# Patient Record
Sex: Female | Born: 1935 | Race: White | Hispanic: No | Smoking: Never smoker
Health system: Southern US, Community
[De-identification: ages and names within clinical notes are randomized; demographics above are authoritative.]

## PROBLEM LIST (undated history)

## (undated) DIAGNOSIS — W19XXXA Unspecified fall, initial encounter: Secondary | ICD-10-CM

## (undated) DIAGNOSIS — D473 Essential (hemorrhagic) thrombocythemia: Secondary | ICD-10-CM

## (undated) DIAGNOSIS — N3281 Overactive bladder: Secondary | ICD-10-CM

## (undated) DIAGNOSIS — I10 Essential (primary) hypertension: Secondary | ICD-10-CM

## (undated) DIAGNOSIS — Y92129 Unspecified place in nursing home as the place of occurrence of the external cause: Secondary | ICD-10-CM

## (undated) DIAGNOSIS — R269 Unspecified abnormalities of gait and mobility: Secondary | ICD-10-CM

## (undated) DIAGNOSIS — M5090 Cervical disc disorder, unspecified, unspecified cervical region: Secondary | ICD-10-CM

## (undated) DIAGNOSIS — K219 Gastro-esophageal reflux disease without esophagitis: Secondary | ICD-10-CM

## (undated) DIAGNOSIS — K922 Gastrointestinal hemorrhage, unspecified: Secondary | ICD-10-CM

## (undated) DIAGNOSIS — G9340 Encephalopathy, unspecified: Secondary | ICD-10-CM

## (undated) DIAGNOSIS — I700 Atherosclerosis of aorta: Secondary | ICD-10-CM

## (undated) DIAGNOSIS — G319 Degenerative disease of nervous system, unspecified: Secondary | ICD-10-CM

## (undated) DIAGNOSIS — R5381 Other malaise: Secondary | ICD-10-CM

## (undated) DIAGNOSIS — M5136 Other intervertebral disc degeneration, lumbar region: Secondary | ICD-10-CM

## (undated) DIAGNOSIS — M4120 Other idiopathic scoliosis, site unspecified: Secondary | ICD-10-CM

## (undated) DIAGNOSIS — L309 Dermatitis, unspecified: Secondary | ICD-10-CM

## (undated) DIAGNOSIS — F32A Depression, unspecified: Secondary | ICD-10-CM

## (undated) DIAGNOSIS — I679 Cerebrovascular disease, unspecified: Secondary | ICD-10-CM

## (undated) DIAGNOSIS — F4323 Adjustment disorder with mixed anxiety and depressed mood: Secondary | ICD-10-CM

## (undated) DIAGNOSIS — M810 Age-related osteoporosis without current pathological fracture: Secondary | ICD-10-CM

## (undated) DIAGNOSIS — K5790 Diverticulosis of intestine, part unspecified, without perforation or abscess without bleeding: Secondary | ICD-10-CM

## (undated) DIAGNOSIS — G459 Transient cerebral ischemic attack, unspecified: Secondary | ICD-10-CM

## (undated) DIAGNOSIS — E785 Hyperlipidemia, unspecified: Secondary | ICD-10-CM

## (undated) DIAGNOSIS — M545 Low back pain: Secondary | ICD-10-CM

## (undated) DIAGNOSIS — D649 Anemia, unspecified: Secondary | ICD-10-CM

## (undated) DIAGNOSIS — F329 Major depressive disorder, single episode, unspecified: Secondary | ICD-10-CM

## (undated) DIAGNOSIS — E46 Unspecified protein-calorie malnutrition: Secondary | ICD-10-CM

## (undated) DIAGNOSIS — Z8679 Personal history of other diseases of the circulatory system: Secondary | ICD-10-CM

## (undated) DIAGNOSIS — M47817 Spondylosis without myelopathy or radiculopathy, lumbosacral region: Secondary | ICD-10-CM

## (undated) DIAGNOSIS — R197 Diarrhea, unspecified: Secondary | ICD-10-CM

## (undated) DIAGNOSIS — M419 Scoliosis, unspecified: Secondary | ICD-10-CM

## (undated) DIAGNOSIS — G8929 Other chronic pain: Secondary | ICD-10-CM

## (undated) HISTORY — PX: CHOLECYSTECTOMY: SHX55

## (undated) HISTORY — DX: Essential (primary) hypertension: I10

## (undated) HISTORY — DX: Hyperlipidemia, unspecified: E78.5

## (undated) HISTORY — DX: Overactive bladder: N32.81

## (undated) HISTORY — DX: Age-related osteoporosis without current pathological fracture: M81.0

## (undated) HISTORY — DX: Gastro-esophageal reflux disease without esophagitis: K21.9

## (undated) HISTORY — DX: Adjustment disorder with mixed anxiety and depressed mood: F43.23

## (undated) HISTORY — DX: Diarrhea, unspecified: R19.7

## (undated) HISTORY — PX: ABDOMINAL HYSTERECTOMY: SHX81

## (undated) HISTORY — DX: Spondylosis without myelopathy or radiculopathy, lumbosacral region: M47.817

## (undated) HISTORY — DX: Depression, unspecified: F32.A

## (undated) HISTORY — DX: Transient cerebral ischemic attack, unspecified: G45.9

## (undated) HISTORY — DX: Personal history of other diseases of the circulatory system: Z86.79

## (undated) HISTORY — DX: Unspecified abnormalities of gait and mobility: R26.9

## (undated) HISTORY — DX: Anemia, unspecified: D64.9

## (undated) HISTORY — DX: Major depressive disorder, single episode, unspecified: F32.9

## (undated) HISTORY — DX: Scoliosis, unspecified: M41.9

## (undated) HISTORY — DX: Unspecified protein-calorie malnutrition: E46

## (undated) HISTORY — DX: Atherosclerosis of aorta: I70.0

## (undated) HISTORY — DX: Essential (hemorrhagic) thrombocythemia: D47.3

## (undated) HISTORY — DX: Other intervertebral disc degeneration, lumbar region: M51.36

## (undated) HISTORY — PX: APPENDECTOMY: SHX54

## (undated) HISTORY — DX: Other chronic pain: G89.29

## (undated) HISTORY — DX: Unspecified fall, initial encounter: W19.XXXA

## (undated) HISTORY — DX: Low back pain: M54.5

## (undated) HISTORY — DX: Cervical disc disorder, unspecified, unspecified cervical region: M50.90

## (undated) HISTORY — DX: Dermatitis, unspecified: L30.9

## (undated) HISTORY — PX: TONSILLECTOMY: SUR1361

## (undated) HISTORY — DX: Other idiopathic scoliosis, site unspecified: M41.20

## (undated) HISTORY — DX: Gastrointestinal hemorrhage, unspecified: K92.2

## (undated) HISTORY — DX: Encephalopathy, unspecified: G93.40

## (undated) HISTORY — DX: Cerebrovascular disease, unspecified: I67.9

## (undated) HISTORY — DX: Unspecified place in nursing home as the place of occurrence of the external cause: Y92.129

## (undated) HISTORY — DX: Degenerative disease of nervous system, unspecified: G31.9

## (undated) HISTORY — DX: Diverticulosis of intestine, part unspecified, without perforation or abscess without bleeding: K57.90

## (undated) HISTORY — DX: Other malaise: R53.81

---

## 1998-02-28 ENCOUNTER — Ambulatory Visit (HOSPITAL_COMMUNITY): Admission: RE | Admit: 1998-02-28 | Discharge: 1998-02-28 | Payer: Self-pay | Admitting: *Deleted

## 1998-02-28 ENCOUNTER — Encounter: Payer: Self-pay | Admitting: *Deleted

## 1998-10-29 ENCOUNTER — Other Ambulatory Visit: Admission: RE | Admit: 1998-10-29 | Discharge: 1998-10-29 | Payer: Self-pay | Admitting: *Deleted

## 1999-03-18 ENCOUNTER — Encounter: Payer: Self-pay | Admitting: *Deleted

## 1999-03-18 ENCOUNTER — Ambulatory Visit (HOSPITAL_COMMUNITY): Admission: RE | Admit: 1999-03-18 | Discharge: 1999-03-18 | Payer: Self-pay | Admitting: *Deleted

## 1999-09-30 ENCOUNTER — Other Ambulatory Visit: Admission: RE | Admit: 1999-09-30 | Discharge: 1999-09-30 | Payer: Self-pay | Admitting: *Deleted

## 2000-08-26 ENCOUNTER — Other Ambulatory Visit: Admission: RE | Admit: 2000-08-26 | Discharge: 2000-08-26 | Payer: Self-pay | Admitting: *Deleted

## 2000-09-15 ENCOUNTER — Ambulatory Visit (HOSPITAL_COMMUNITY): Admission: RE | Admit: 2000-09-15 | Discharge: 2000-09-15 | Payer: Self-pay | Admitting: *Deleted

## 2000-09-15 ENCOUNTER — Encounter: Payer: Self-pay | Admitting: *Deleted

## 2000-09-17 ENCOUNTER — Encounter: Admission: RE | Admit: 2000-09-17 | Discharge: 2000-09-17 | Payer: Self-pay | Admitting: *Deleted

## 2001-09-27 ENCOUNTER — Ambulatory Visit (HOSPITAL_COMMUNITY): Admission: RE | Admit: 2001-09-27 | Discharge: 2001-09-27 | Payer: Self-pay | Admitting: *Deleted

## 2001-09-27 ENCOUNTER — Encounter: Payer: Self-pay | Admitting: *Deleted

## 2001-12-19 ENCOUNTER — Ambulatory Visit (HOSPITAL_COMMUNITY): Admission: RE | Admit: 2001-12-19 | Discharge: 2001-12-19 | Payer: Self-pay | Admitting: Cardiology

## 2001-12-19 ENCOUNTER — Encounter: Payer: Self-pay | Admitting: Cardiology

## 2002-03-10 ENCOUNTER — Encounter: Admission: RE | Admit: 2002-03-10 | Discharge: 2002-03-10 | Payer: Self-pay | Admitting: Orthopedic Surgery

## 2002-03-10 ENCOUNTER — Encounter: Payer: Self-pay | Admitting: Orthopedic Surgery

## 2002-03-24 ENCOUNTER — Encounter: Admission: RE | Admit: 2002-03-24 | Discharge: 2002-03-24 | Payer: Self-pay | Admitting: Orthopedic Surgery

## 2002-03-24 ENCOUNTER — Encounter: Payer: Self-pay | Admitting: Orthopedic Surgery

## 2002-10-04 ENCOUNTER — Ambulatory Visit (HOSPITAL_COMMUNITY): Admission: RE | Admit: 2002-10-04 | Discharge: 2002-10-04 | Payer: Self-pay | Admitting: Geriatric Medicine

## 2002-11-20 ENCOUNTER — Other Ambulatory Visit: Admission: RE | Admit: 2002-11-20 | Discharge: 2002-11-20 | Payer: Self-pay | Admitting: Obstetrics and Gynecology

## 2003-12-31 ENCOUNTER — Ambulatory Visit (HOSPITAL_COMMUNITY): Admission: RE | Admit: 2003-12-31 | Discharge: 2003-12-31 | Payer: Self-pay | Admitting: Obstetrics and Gynecology

## 2004-01-29 ENCOUNTER — Other Ambulatory Visit: Admission: RE | Admit: 2004-01-29 | Discharge: 2004-01-29 | Payer: Self-pay | Admitting: Obstetrics and Gynecology

## 2004-02-29 ENCOUNTER — Ambulatory Visit: Payer: Self-pay | Admitting: Internal Medicine

## 2004-05-14 ENCOUNTER — Ambulatory Visit: Payer: Self-pay | Admitting: Internal Medicine

## 2004-05-23 ENCOUNTER — Ambulatory Visit: Payer: Self-pay | Admitting: Internal Medicine

## 2004-05-30 ENCOUNTER — Ambulatory Visit: Payer: Self-pay | Admitting: Internal Medicine

## 2004-07-04 ENCOUNTER — Ambulatory Visit: Payer: Self-pay | Admitting: Internal Medicine

## 2004-10-06 ENCOUNTER — Ambulatory Visit: Payer: Self-pay | Admitting: Internal Medicine

## 2004-10-09 ENCOUNTER — Encounter: Payer: Self-pay | Admitting: Internal Medicine

## 2004-10-09 ENCOUNTER — Ambulatory Visit: Payer: Self-pay

## 2005-03-02 ENCOUNTER — Ambulatory Visit: Payer: Self-pay | Admitting: Internal Medicine

## 2005-04-24 ENCOUNTER — Ambulatory Visit: Payer: Self-pay | Admitting: Internal Medicine

## 2005-04-28 ENCOUNTER — Ambulatory Visit: Payer: Self-pay | Admitting: *Deleted

## 2005-04-30 ENCOUNTER — Ambulatory Visit: Payer: Self-pay | Admitting: Internal Medicine

## 2005-06-05 ENCOUNTER — Ambulatory Visit: Payer: Self-pay | Admitting: Internal Medicine

## 2005-07-01 ENCOUNTER — Encounter: Admission: RE | Admit: 2005-07-01 | Discharge: 2005-07-01 | Payer: Self-pay | Admitting: Neurology

## 2005-08-12 ENCOUNTER — Ambulatory Visit: Payer: Self-pay | Admitting: Internal Medicine

## 2005-09-30 ENCOUNTER — Ambulatory Visit: Payer: Self-pay | Admitting: Internal Medicine

## 2005-12-04 ENCOUNTER — Ambulatory Visit: Payer: Self-pay | Admitting: Internal Medicine

## 2005-12-30 ENCOUNTER — Ambulatory Visit: Payer: Self-pay | Admitting: Internal Medicine

## 2006-02-17 ENCOUNTER — Ambulatory Visit: Payer: Self-pay | Admitting: Internal Medicine

## 2006-05-05 ENCOUNTER — Ambulatory Visit: Payer: Self-pay | Admitting: Internal Medicine

## 2006-05-05 LAB — CONVERTED CEMR LAB
ALT: 16 units/L (ref 0–40)
AST: 24 units/L (ref 0–37)
Albumin: 3.7 g/dL (ref 3.5–5.2)
Alkaline Phosphatase: 65 units/L (ref 39–117)
BUN: 15 mg/dL (ref 6–23)
Bilirubin, Direct: 0.2 mg/dL (ref 0.0–0.3)
CO2: 29 meq/L (ref 19–32)
Calcium: 10.1 mg/dL (ref 8.4–10.5)
Chloride: 106 meq/L (ref 96–112)
Cholesterol: 208 mg/dL (ref 0–200)
Creatinine, Ser: 1 mg/dL (ref 0.4–1.2)
Direct LDL: 114.7 mg/dL
GFR calc Af Amer: 69 mL/min
GFR calc non Af Amer: 57 mL/min
Glucose, Bld: 85 mg/dL (ref 70–99)
HDL: 64.2 mg/dL (ref >39.0)
Potassium: 4.7 meq/L (ref 3.5–5.1)
Sodium: 140 meq/L (ref 135–145)
Total Bilirubin: 0.8 mg/dL (ref 0.3–1.2)
Total CHOL/HDL Ratio: 3.2
Total Protein: 6.6 g/dL (ref 6.0–8.3)
Triglycerides: 171 mg/dL — ABNORMAL HIGH (ref 0–149)
VLDL: 34 mg/dL (ref 0–40)

## 2006-08-18 ENCOUNTER — Ambulatory Visit: Payer: Self-pay | Admitting: Internal Medicine

## 2006-09-17 ENCOUNTER — Ambulatory Visit: Payer: Self-pay | Admitting: Internal Medicine

## 2006-09-20 DIAGNOSIS — G459 Transient cerebral ischemic attack, unspecified: Secondary | ICD-10-CM

## 2006-09-20 DIAGNOSIS — E785 Hyperlipidemia, unspecified: Secondary | ICD-10-CM

## 2006-09-20 DIAGNOSIS — Z8679 Personal history of other diseases of the circulatory system: Secondary | ICD-10-CM

## 2006-09-20 DIAGNOSIS — M810 Age-related osteoporosis without current pathological fracture: Secondary | ICD-10-CM

## 2006-09-20 DIAGNOSIS — K219 Gastro-esophageal reflux disease without esophagitis: Secondary | ICD-10-CM | POA: Insufficient documentation

## 2006-09-20 DIAGNOSIS — J45909 Unspecified asthma, uncomplicated: Secondary | ICD-10-CM

## 2006-09-20 DIAGNOSIS — I10 Essential (primary) hypertension: Secondary | ICD-10-CM

## 2006-09-20 HISTORY — DX: Essential (primary) hypertension: I10

## 2006-09-20 HISTORY — DX: Hyperlipidemia, unspecified: E78.5

## 2006-09-20 HISTORY — DX: Personal history of other diseases of the circulatory system: Z86.79

## 2006-09-20 HISTORY — DX: Age-related osteoporosis without current pathological fracture: M81.0

## 2006-09-20 HISTORY — DX: Transient cerebral ischemic attack, unspecified: G45.9

## 2006-09-29 ENCOUNTER — Ambulatory Visit: Payer: Self-pay | Admitting: Internal Medicine

## 2006-09-29 LAB — CONVERTED CEMR LAB
BUN: 22 mg/dL (ref 6–23)
Basophils Absolute: 0 10*3/uL (ref 0.0–0.1)
Basophils Relative: 0.2 % (ref 0.0–1.0)
CO2: 32 meq/L (ref 19–32)
Calcium: 10.3 mg/dL (ref 8.4–10.5)
Chloride: 105 meq/L (ref 96–112)
Cholesterol: 232 mg/dL (ref 0–200)
Creatinine, Ser: 1.2 mg/dL (ref 0.4–1.2)
Direct LDL: 135.2 mg/dL
Eosinophils Absolute: 0.3 10*3/uL (ref 0.0–0.6)
Eosinophils Relative: 3.3 % (ref 0.0–5.0)
GFR calc Af Amer: 56 mL/min
GFR calc non Af Amer: 46 mL/min
Glucose, Bld: 121 mg/dL — ABNORMAL HIGH (ref 70–99)
HCT: 39.3 % (ref 36.0–46.0)
HDL: 65.6 mg/dL (ref >39.0)
Hemoglobin: 13.6 g/dL (ref 12.0–15.0)
Lymphocytes Relative: 14.9 % (ref 12.0–46.0)
MCHC: 34.7 g/dL (ref 30.0–36.0)
MCV: 91.6 fL (ref 78.0–100.0)
Monocytes Absolute: 0.9 10*3/uL — ABNORMAL HIGH (ref 0.2–0.7)
Monocytes Relative: 9.7 % (ref 3.0–11.0)
Neutro Abs: 6.6 10*3/uL (ref 1.4–7.7)
Neutrophils Relative %: 71.9 % (ref 43.0–77.0)
Platelets: 345 10*3/uL (ref 150–400)
Potassium: 4.1 meq/L (ref 3.5–5.1)
RBC: 4.29 M/uL (ref 3.87–5.11)
RDW: 12.1 % (ref 11.5–14.6)
Sodium: 145 meq/L (ref 135–145)
TSH: 0.78 microintl units/mL (ref 0.35–5.50)
Total CHOL/HDL Ratio: 3.5
Triglycerides: 148 mg/dL (ref 0–149)
VLDL: 30 mg/dL (ref 0–40)
WBC: 9.2 10*3/uL (ref 4.5–10.5)

## 2006-11-04 ENCOUNTER — Ambulatory Visit: Payer: Self-pay | Admitting: Internal Medicine

## 2006-11-17 ENCOUNTER — Ambulatory Visit: Payer: Self-pay | Admitting: Internal Medicine

## 2006-11-22 DIAGNOSIS — F329 Major depressive disorder, single episode, unspecified: Secondary | ICD-10-CM

## 2006-11-22 DIAGNOSIS — D649 Anemia, unspecified: Secondary | ICD-10-CM

## 2006-11-23 ENCOUNTER — Ambulatory Visit: Payer: Self-pay | Admitting: Internal Medicine

## 2006-12-24 ENCOUNTER — Ambulatory Visit: Payer: Self-pay | Admitting: Internal Medicine

## 2007-03-01 ENCOUNTER — Other Ambulatory Visit: Admission: RE | Admit: 2007-03-01 | Discharge: 2007-03-01 | Payer: Self-pay | Admitting: Obstetrics and Gynecology

## 2007-03-02 ENCOUNTER — Ambulatory Visit: Payer: Self-pay | Admitting: Internal Medicine

## 2007-04-25 ENCOUNTER — Ambulatory Visit (HOSPITAL_COMMUNITY): Admission: RE | Admit: 2007-04-25 | Discharge: 2007-04-25 | Payer: Self-pay | Admitting: Obstetrics and Gynecology

## 2007-06-08 ENCOUNTER — Ambulatory Visit: Payer: Self-pay | Admitting: Internal Medicine

## 2007-08-23 ENCOUNTER — Ambulatory Visit: Payer: Self-pay | Admitting: Internal Medicine

## 2007-09-07 ENCOUNTER — Ambulatory Visit: Payer: Self-pay | Admitting: Internal Medicine

## 2007-09-08 ENCOUNTER — Telehealth: Payer: Self-pay | Admitting: Internal Medicine

## 2007-11-21 ENCOUNTER — Telehealth: Payer: Self-pay | Admitting: Internal Medicine

## 2007-11-21 ENCOUNTER — Ambulatory Visit: Payer: Self-pay | Admitting: Internal Medicine

## 2007-12-08 ENCOUNTER — Ambulatory Visit: Payer: Self-pay | Admitting: Internal Medicine

## 2007-12-09 LAB — CONVERTED CEMR LAB
AST: 19 units/L (ref 0–37)
Alkaline Phosphatase: 58 units/L (ref 39–117)
Bilirubin, Direct: 0.1 mg/dL (ref 0.0–0.3)
CO2: 30 meq/L (ref 19–32)
Chloride: 107 meq/L (ref 96–112)
Creatinine, Ser: 0.9 mg/dL (ref 0.4–1.2)
Eosinophils Absolute: 0.1 10*3/uL (ref 0.0–0.7)
Eosinophils Relative: 1.5 % (ref 0.0–5.0)
GFR calc Af Amer: 77 mL/min
GFR calc non Af Amer: 64 mL/min
Hemoglobin: 13.9 g/dL (ref 12.0–15.0)
LDL Cholesterol: 108 mg/dL — ABNORMAL HIGH (ref 0–99)
Lymphocytes Relative: 8.5 % — ABNORMAL LOW (ref 12.0–46.0)
MCV: 93.5 fL (ref 78.0–100.0)
Neutro Abs: 6.9 10*3/uL (ref 1.4–7.7)
Neutrophils Relative %: 82 % — ABNORMAL HIGH (ref 43.0–77.0)
Platelets: 430 10*3/uL — ABNORMAL HIGH (ref 150–400)
RBC: 4.28 M/uL (ref 3.87–5.11)
RDW: 12.4 % (ref 11.5–14.6)
Sodium: 143 meq/L (ref 135–145)
WBC: 8.3 10*3/uL (ref 4.5–10.5)

## 2007-12-29 ENCOUNTER — Ambulatory Visit: Payer: Self-pay | Admitting: Internal Medicine

## 2007-12-29 LAB — CONVERTED CEMR LAB
Ketones, urine, test strip: NEGATIVE
Nitrite: NEGATIVE
Protein, U semiquant: NEGATIVE
Specific Gravity, Urine: 1.01
pH: 7

## 2008-02-27 ENCOUNTER — Telehealth: Payer: Self-pay | Admitting: Internal Medicine

## 2008-04-13 LAB — HM MAMMOGRAPHY: HM Mammogram: ABNORMAL

## 2008-05-09 ENCOUNTER — Ambulatory Visit (HOSPITAL_COMMUNITY): Admission: RE | Admit: 2008-05-09 | Discharge: 2008-05-09 | Payer: Self-pay | Admitting: Obstetrics and Gynecology

## 2008-05-24 ENCOUNTER — Ambulatory Visit: Payer: Self-pay | Admitting: Family Medicine

## 2008-05-24 LAB — CONVERTED CEMR LAB
Glucose, Urine, Semiquant: NEGATIVE
Nitrite: NEGATIVE
Specific Gravity, Urine: 1.025
Urobilinogen, UA: 1
pH: 6

## 2008-06-25 ENCOUNTER — Ambulatory Visit: Payer: Self-pay | Admitting: Internal Medicine

## 2008-06-26 LAB — CONVERTED CEMR LAB
BUN: 14 mg/dL (ref 6–23)
Potassium: 3.3 meq/L — ABNORMAL LOW (ref 3.5–5.1)
Sodium: 142 meq/L (ref 135–145)

## 2008-06-28 LAB — CONVERTED CEMR LAB: Vit D, 25-Hydroxy: 30 ng/mL (ref 30–89)

## 2008-07-04 ENCOUNTER — Ambulatory Visit: Payer: Self-pay | Admitting: Internal Medicine

## 2008-07-09 LAB — CONVERTED CEMR LAB
BUN: 14 mg/dL (ref 6–23)
CO2: 30 meq/L (ref 19–32)
Calcium: 9.9 mg/dL (ref 8.4–10.5)
GFR calc non Af Amer: 63.64 mL/min (ref >60)
Sodium: 144 meq/L (ref 135–145)

## 2008-10-17 ENCOUNTER — Ambulatory Visit: Payer: Self-pay | Admitting: Internal Medicine

## 2008-10-17 LAB — CONVERTED CEMR LAB: Vit D, 25-Hydroxy: 40 ng/mL (ref 30–89)

## 2008-12-10 ENCOUNTER — Ambulatory Visit: Payer: Self-pay | Admitting: Family Medicine

## 2008-12-18 ENCOUNTER — Ambulatory Visit: Payer: Self-pay | Admitting: Internal Medicine

## 2009-01-07 ENCOUNTER — Ambulatory Visit: Payer: Self-pay | Admitting: Internal Medicine

## 2009-01-10 LAB — CONVERTED CEMR LAB
CO2: 29 meq/L (ref 19–32)
Calcium: 10.1 mg/dL (ref 8.4–10.5)
Chloride: 109 meq/L (ref 96–112)
Creatinine, Ser: 0.8 mg/dL (ref 0.4–1.2)
GFR calc non Af Amer: 72.82 mL/min (ref >60)
Sodium: 145 meq/L (ref 135–145)

## 2009-03-15 ENCOUNTER — Ambulatory Visit: Payer: Self-pay | Admitting: Family Medicine

## 2009-03-18 ENCOUNTER — Ambulatory Visit: Payer: Self-pay | Admitting: Obstetrics and Gynecology

## 2009-04-18 ENCOUNTER — Ambulatory Visit: Payer: Self-pay | Admitting: Obstetrics and Gynecology

## 2009-05-28 ENCOUNTER — Telehealth: Payer: Self-pay | Admitting: Internal Medicine

## 2009-05-31 ENCOUNTER — Telehealth: Payer: Self-pay | Admitting: Internal Medicine

## 2009-06-11 ENCOUNTER — Encounter: Payer: Self-pay | Admitting: Internal Medicine

## 2009-07-01 ENCOUNTER — Ambulatory Visit: Payer: Self-pay | Admitting: Internal Medicine

## 2009-07-02 LAB — CONVERTED CEMR LAB
Basophils Absolute: 0 10*3/uL (ref 0.0–0.1)
CO2: 31 meq/L (ref 19–32)
Chloride: 108 meq/L (ref 96–112)
Glucose, Bld: 98 mg/dL (ref 70–99)
Monocytes Absolute: 0.7 10*3/uL (ref 0.1–1.0)
Platelets: 286 10*3/uL (ref 150.0–400.0)
Potassium: 4.7 meq/L (ref 3.5–5.1)
RBC: 4.24 M/uL (ref 3.87–5.11)
RDW: 12.2 % (ref 11.5–14.6)
Sodium: 144 meq/L (ref 135–145)
WBC: 7.5 10*3/uL (ref 4.5–10.5)

## 2009-07-10 ENCOUNTER — Encounter: Payer: Self-pay | Admitting: Internal Medicine

## 2009-10-18 ENCOUNTER — Ambulatory Visit: Payer: Self-pay | Admitting: Internal Medicine

## 2009-10-21 ENCOUNTER — Telehealth: Payer: Self-pay | Admitting: Internal Medicine

## 2009-10-25 ENCOUNTER — Encounter: Admission: RE | Admit: 2009-10-25 | Discharge: 2009-10-25 | Payer: Self-pay | Admitting: Internal Medicine

## 2009-10-28 DIAGNOSIS — M4120 Other idiopathic scoliosis, site unspecified: Secondary | ICD-10-CM | POA: Insufficient documentation

## 2009-10-28 DIAGNOSIS — M5137 Other intervertebral disc degeneration, lumbosacral region: Secondary | ICD-10-CM | POA: Insufficient documentation

## 2009-10-28 DIAGNOSIS — M47817 Spondylosis without myelopathy or radiculopathy, lumbosacral region: Secondary | ICD-10-CM | POA: Insufficient documentation

## 2009-10-28 HISTORY — DX: Other idiopathic scoliosis, site unspecified: M41.20

## 2009-10-28 HISTORY — DX: Spondylosis without myelopathy or radiculopathy, lumbosacral region: M47.817

## 2009-12-20 ENCOUNTER — Ambulatory Visit: Payer: Self-pay | Admitting: Family Medicine

## 2009-12-20 LAB — CONVERTED CEMR LAB
Bilirubin Urine: NEGATIVE
Urobilinogen, UA: 0.2
pH: 5.5

## 2009-12-27 ENCOUNTER — Ambulatory Visit: Payer: Self-pay | Admitting: Internal Medicine

## 2009-12-28 ENCOUNTER — Encounter: Payer: Self-pay | Admitting: Internal Medicine

## 2009-12-30 ENCOUNTER — Ambulatory Visit: Payer: Self-pay | Admitting: Internal Medicine

## 2009-12-30 LAB — CONVERTED CEMR LAB
Basophils Absolute: 0 10*3/uL (ref 0.0–0.1)
Basophils Relative: 0 % (ref 0.0–3.0)
Calcium: 11 mg/dL — ABNORMAL HIGH (ref 8.4–10.5)
Chloride: 102 meq/L (ref 96–112)
Eosinophils Relative: 0.9 % (ref 0.0–5.0)
Glucose, Bld: 88 mg/dL (ref 70–99)
HCT: 43.8 % (ref 36.0–46.0)
Hemoglobin: 15.3 g/dL — ABNORMAL HIGH (ref 12.0–15.0)
MCHC: 34.8 g/dL (ref 30.0–36.0)
Monocytes Relative: 7.1 % (ref 3.0–12.0)
RBC: 4.59 M/uL (ref 3.87–5.11)
RDW: 13.5 % (ref 11.5–14.6)
Sodium: 141 meq/L (ref 135–145)

## 2009-12-31 ENCOUNTER — Ambulatory Visit: Payer: Self-pay | Admitting: Internal Medicine

## 2009-12-31 LAB — CONVERTED CEMR LAB
BUN: 14 mg/dL (ref 6–23)
CO2: 32 meq/L (ref 19–32)

## 2010-01-01 ENCOUNTER — Encounter: Payer: Self-pay | Admitting: Internal Medicine

## 2010-01-22 ENCOUNTER — Ambulatory Visit: Payer: Self-pay | Admitting: Internal Medicine

## 2010-03-13 ENCOUNTER — Encounter: Payer: Self-pay | Admitting: Internal Medicine

## 2010-03-25 ENCOUNTER — Ambulatory Visit: Payer: Self-pay | Admitting: Internal Medicine

## 2010-04-28 ENCOUNTER — Encounter: Payer: Self-pay | Admitting: Internal Medicine

## 2010-04-28 ENCOUNTER — Other Ambulatory Visit: Payer: Self-pay | Admitting: Internal Medicine

## 2010-04-28 ENCOUNTER — Ambulatory Visit
Admission: RE | Admit: 2010-04-28 | Discharge: 2010-04-28 | Payer: Self-pay | Source: Home / Self Care | Attending: Internal Medicine | Admitting: Internal Medicine

## 2010-04-28 DIAGNOSIS — R269 Unspecified abnormalities of gait and mobility: Secondary | ICD-10-CM

## 2010-04-28 HISTORY — DX: Unspecified abnormalities of gait and mobility: R26.9

## 2010-04-28 LAB — BASIC METABOLIC PANEL
BUN: 14 mg/dL (ref 6–23)
CO2: 28 mEq/L (ref 19–32)
Calcium: 10.1 mg/dL (ref 8.4–10.5)
Chloride: 103 mEq/L (ref 96–112)
Creatinine, Ser: 0.8 mg/dL (ref 0.4–1.2)
GFR: 77.01 mL/min (ref >60.00)
Glucose, Bld: 100 mg/dL — ABNORMAL HIGH (ref 70–99)
Potassium: 4.7 mEq/L (ref 3.5–5.1)
Sodium: 139 mEq/L (ref 135–145)

## 2010-05-04 ENCOUNTER — Encounter: Payer: Self-pay | Admitting: Internal Medicine

## 2010-05-13 NOTE — Medication Information (Signed)
Summary: Order for [facility] with wheels and seat  Order for [name] with wheels and seat   Imported By: Maryln Gottron 07/15/2009 12:26:06  _____________________________________________________________________  External Attachment:    Type:   Image     Comment:   External Document

## 2010-05-13 NOTE — Assessment & Plan Note (Signed)
Summary: 6 month rov/njr   Vital Signs:  Patient profile:   77 year old female BP sitting:   140 / 80  (left arm) Cuff size:   regular  Vitals Entered By: Kern Reap CMA Duncan Dull) (July 01, 2009 11:45 AM) CC: follow-up visit Is Patient Diabetic? No   CC:  follow-up visit.  History of Present Illness:  Follow-Up Visit      This is an 77 year old woman who presents for Follow-up visit.  The patient denies chest pain and palpitations.  Since the last visit the patient notes no new problems or concerns.  The patient reports taking meds as prescribed and not monitoring BP.  When questioned about possible medication side effects, the patient notes none.  Has seen dr Ross Marcus results except for dry mouth.  All other systems reviewed and were negative   Current Problems (verified): 1)  Sciatica  (ICD-724.3) 2)  Unsteady Gait  (ICD-781.2) 3)  Common Migraine  (ICD-346.10) 4)  Depression  (ICD-311) 5)  Anemia-nos  (ICD-285.9) 6)  Transient Ischemic Attack, Hx of  (ICD-V12.50) 7)  Osteoporosis  (ICD-733.00) 8)  Hypertension  (ICD-401.9) 9)  Hyperlipidemia  (ICD-272.4) 10)  Gerd  (ICD-530.81) 11)  Asthma  (ICD-493.90)  Current Medications (verified): 1)  Benicar 20 Mg  Tabs (Olmesartan Medoxomil) .... One Daily 2)  Norvasc 5 Mg  Tabs (Amlodipine Besylate) .... One By Mouth Daily 3)  Boniva 150 Mg  Tabs (Ibandronate Sodium) .... One Monthly 4)  Aspir-81 81 Mg Tbec (Aspirin) .... Take 1 Tablet By Mouth Once A Day 5)  Vitamin C 500 Mg Tabs (Ascorbic Acid) .... Take 1 Once A Day 6)  Caltrate 600+d 600-400 Mg-Unit  Tabs (Calcium Carbonate-Vitamin D) .... Once Daily 7)  Centrum Silver   Tabs (Multiple Vitamins-Minerals) .... Once Daily 8)  Proair Hfa 108 (90 Base) Mcg/act  Aers (Albuterol Sulfate) .... Use Qid As Directed 9)  Vesicare 10 Mg Tabs (Solifenacin Succinate) .... Take 1 Tablet By Mouth Once A Day 10)  Diclofenac Sodium 50 Mg Tbec (Diclofenac Sodium) ....  Three Times A Day As Needed Pain  Allergies (verified): 1)  Amoxicillin (Amoxicillin) 2)  Penicillin V Potassium (Penicillin V Potassium) 3)  Sulfamethoxazole (Sulfamethoxazole)  Past History:  Past Medical History: Last updated: December 10, 2006 Allergic rhinitis Asthma GERD Hyperlipidemia Hypertension Osteoporosis Transient ischemic attack, hx of Urinary incontinence DDD-neck cerebral small vessel disease Anemia-NOS Depression Headache  Past Surgical History: Last updated: 12-10-06 Appendectomy Hysterectomy Tonsillectomy Cholecystectomy  Family History: Last updated: 12-10-06 first husband deceased with lung CANCER mother deceased with CHF 21 yo father deceased pancreatic CA 61 Brother deceased with leukemia 63 yo Family History of Alcoholism/Addiction Family History of Arthritis Family History Breast cancer 1st degree relative <50 Family History Diabetes 1st degree relative Family History High cholesterol Family History Hypertension Family History Other cancer Family History of Stroke F 1st degree relative <60 Family History of Cardiovascular disorder  Social History: Last updated: 12-10-06 Single Never Smoked Retired Alcohol use-no Drug use-no Regular exercise-yes  Risk Factors: Exercise: yes (12/10/06)  Risk Factors: Smoking Status: never (11/04/2006)  Physical Exam  General:  alert and well-developed.   Head:  normocephalic and atraumatic.   Eyes:  pupils equal and pupils round.   Ears:  R ear normal and L ear normal.   Neck:  No deformities, masses, or tenderness noted. Lungs:  normal respiratory effort and no intercostal retractions.   Heart:  normal rate and regular rhythm.   Abdomen:  soft  and non-tender.   Msk:  No deformity or scoliosis noted of thoracic or lumbar spine.   Pulses:  R radial normal and L radial normal.   Neurologic:  cranial nerves II-XII intact and gait normal.     Impression & Recommendations:  Problem #  1:  UNSTEADY GAIT (ICD-781.2) hand written Rx for rolling walkier  Problem # 2:  HYPERTENSION (ICD-401.9)  reasonable control continue current medications  The following medications were removed from the medication list:    Benicar 20 Mg Tabs (Olmesartan medoxomil) ..... One daily Her updated medication list for this problem includes:    Norvasc 5 Mg Tabs (Amlodipine besylate) ..... One by mouth daily    Losartan Potassium 100 Mg Tabs (Losartan potassium) .Marland Kitchen... Take 1 tablet by mouth once a day  BP today: 140/80 Prior BP: 134/74 (03/15/2009)  Labs Reviewed: K+: 4.5 (01/07/2009) Creat: : 0.8 (01/07/2009)   Chol: 204 (12/08/2007)   HDL: 70.6 (12/08/2007)   LDL: 108 (12/08/2007)   TG: 127 (12/08/2007)  Orders: Venipuncture (28413) TLB-BMP (Basic Metabolic Panel-BMET) (80048-METABOL)  Problem # 3:  HYPERLIPIDEMIA (ICD-272.4)  no need for further evaluation Labs Reviewed: SGOT: 19 (12/08/2007)   SGPT: 14 (12/08/2007)   HDL:70.6 (12/08/2007), 65.6 (09/29/2006)  LDL:108 (12/08/2007), DEL (09/29/2006)  Chol:204 (12/08/2007), 232 (09/29/2006)  Trig:127 (12/08/2007), 148 (09/29/2006)  Orders: Venipuncture (24401)  Problem # 4:  GERD (ICD-530.81)  no sxs and not on any meds  Labs Reviewed: Hgb: 13.9 (12/08/2007)   Hct: 40.0 (12/08/2007)  Orders: Venipuncture (02725)  Problem # 5:  TRANSIENT ISCHEMIC ATTACK, HX OF (ICD-V12.50) no recurrence  Problem # 6:  SCIATICA (ICD-724.3) uses as needed diclofenac with great results Her updated medication list for this problem includes:    Aspir-81 81 Mg Tbec (Aspirin) .Marland Kitchen... Take 1 tablet by mouth once a day    Diclofenac Sodium 50 Mg Tbec (Diclofenac sodium) .Marland Kitchen... Take 1 tablet by mouth once a day as needed for hip/leg pain  Complete Medication List: 1)  Norvasc 5 Mg Tabs (Amlodipine besylate) .... One by mouth daily 2)  Boniva 150 Mg Tabs (Ibandronate sodium) .... One monthly 3)  Aspir-81 81 Mg Tbec (Aspirin) .... Take 1 tablet by  mouth once a day 4)  Vitamin C 500 Mg Tabs (Ascorbic acid) .... Take 1 once a day 5)  Caltrate 600+d 600-400 Mg-unit Tabs (Calcium carbonate-vitamin d) .... Once daily 6)  Centrum Silver Tabs (Multiple vitamins-minerals) .... Once daily 7)  Proair Hfa 108 (90 Base) Mcg/act Aers (Albuterol sulfate) .... Use qid as directed 8)  Diclofenac Sodium 50 Mg Tbec (Diclofenac sodium) .... Take 1 tablet by mouth once a day as needed for hip/leg pain 9)  Losartan Potassium 100 Mg Tabs (Losartan potassium) .... Take 1 tablet by mouth once a day 10)  Toviaz 4 Mg Xr24h-tab (Fesoterodine fumarate) .... Take 1 tablet by mouth once a day  Other Orders: TLB-CBC Platelet - w/Differential (85025-CBCD)  Patient Instructions: 1)  Please schedule a follow-up appointment in 6 months. Prescriptions: LOSARTAN POTASSIUM 100 MG TABS (LOSARTAN POTASSIUM) Take 1 tablet by mouth once a day  #90 x 3   Entered and Authorized by:   Birdie Sons MD   Signed by:   Birdie Sons MD on 07/01/2009   Method used:   Electronically to        [facility]* (retail)       [address]       [place], [place]  [address]  Ph: [phone]       Fax: [phone]   RxID:   [id number]

## 2010-05-13 NOTE — Assessment & Plan Note (Signed)
Summary: 6 month fup//ccm   Vital Signs:  Patient profile:   77 year old female Weight:      146 pounds Temp:     98.3 degrees F oral BP sitting:   120 / 76  (left arm) Cuff size:   regular  Vitals Entered By: Kern Reap CMA Duncan Dull) (December 27, 2009 11:34 AM) CC: follow-up visit Is Patient Diabetic? No   CC:  follow-up visit.  History of Present Illness: recurrent UTI sxs---she has some frequency---no dysuria reviewed records from dr Doroteo Bradford note Needs f/u  requests flu immunization  Htn---currently not on any medications  Back pain/sciatica----no sxs  Current Medications (verified): 1)  Norvasc 5 Mg  Tabs (Amlodipine Besylate) .... One By Mouth Daily 2)  Boniva 150 Mg  Tabs (Ibandronate Sodium) .... One Monthly 3)  Aspir-81 81 Mg Tbec (Aspirin) .... Take 1 Tablet By Mouth Once A Day 4)  Vitamin C 500 Mg Tabs (Ascorbic Acid) .... Take 1 Once A Day 5)  Caltrate 600+d 600-400 Mg-Unit  Tabs (Calcium Carbonate-Vitamin D) .... Once Daily 6)  Centrum Silver   Tabs (Multiple Vitamins-Minerals) .... Once Daily 7)  Proair Hfa 108 (90 Base) Mcg/act  Aers (Albuterol Sulfate) .... Use Qid As Directed 8)  Losartan Potassium 100 Mg Tabs (Losartan Potassium) .... Take 1 Tablet By Mouth Once A Day  Allergies: 1)  Amoxicillin (Amoxicillin) 2)  Penicillin V Potassium (Penicillin V Potassium) 3)  Sulfamethoxazole (Sulfamethoxazole)  Physical Exam  General:  alert and well-developed.   Head:  normocephalic and atraumatic.   Eyes:  pupils equal and pupils round.   Ears:  R ear normal and L ear normal.   Neck:  No deformities, masses, or tenderness noted. Chest Wall:  No deformities, masses, or tenderness noted. Lungs:  normal respiratory effort and no intercostal retractions.   Abdomen:  soft and non-tender.   Skin:  turgor normal and color normal.   Psych:  normally interactive and good eye contact.     Impression & Recommendations:  Problem # 1:  FREQUENCY,  URINARY (ICD-788.41) check urine culture---document clearance The following medications were removed from the medication list:    Toviaz 4 Mg Xr24h-tab (Fesoterodine fumarate) .Marland Kitchen... Take 1 tablet by mouth once a day    Pyridium 200 Mg Tabs (Phenazopyridine hcl) .Marland Kitchen... 1 tab by mouth three times a day x 2 days as needed pain  Orders: T-Culture, Urine ([id number]) Specimen Handling (96295)  Problem # 2:  HYPERTENSION (ICD-401.9) controlled continue current medications  Her updated medication list for this problem includes:    Norvasc 5 Mg Tabs (Amlodipine besylate) ..... One by mouth daily    Losartan Potassium 100 Mg Tabs (Losartan potassium) .Marland Kitchen... Take 1 tablet by mouth once a day  Orders: Venipuncture (28413) Specimen Handling (24401) TLB-BMP (Basic Metabolic Panel-BMET) (80048-METABOL)  BP today: 120/76 Prior BP: 128/70 (12/20/2009)  Labs Reviewed: K+: 4.7 (07/01/2009) Creat: : 0.8 (07/01/2009)   Chol: 204 (12/08/2007)   HDL: 70.6 (12/08/2007)   LDL: 108 (12/08/2007)   TG: 127 (12/08/2007)  Problem # 3:  HYPERLIPIDEMIA (ICD-272.4) note HDL---no Rx Labs Reviewed: SGOT: 19 (12/08/2007)   SGPT: 14 (12/08/2007)   HDL:70.6 (12/08/2007), 65.6 (09/29/2006)  LDL:108 (12/08/2007), DEL (09/29/2006)  Chol:204 (12/08/2007), 232 (09/29/2006)  Trig:127 (12/08/2007), 148 (09/29/2006)  Problem # 4:  GERD (ICD-530.81) no sxs and not on any meds  Complete Medication List: 1)  Norvasc 5 Mg Tabs (Amlodipine besylate) .... One by mouth daily 2)  Boniva 150 Mg Tabs (Ibandronate  sodium) .... One monthly 3)  Aspir-81 81 Mg Tbec (Aspirin) .... Take 1 tablet by mouth once a day 4)  Vitamin C 500 Mg Tabs (Ascorbic acid) .... Take 1 once a day 5)  Caltrate 600+d 600-400 Mg-unit Tabs (Calcium carbonate-vitamin d) .... Once daily 6)  Centrum Silver Tabs (Multiple vitamins-minerals) .... Once daily 7)  Proair Hfa 108 (90 Base) Mcg/act Aers (Albuterol sulfate) .... Use qid as directed 8)  Losartan  Potassium 100 Mg Tabs (Losartan potassium) .... Take 1 tablet by mouth once a day  Other Orders: TLB-CBC Platelet - w/Differential (85025-CBCD)

## 2010-05-13 NOTE — Miscellaneous (Signed)
Summary: PT Eval Order/[facility]  PT Eval Order/[facility]   Imported By: Sherian Rein 06/13/2009 12:28:29  _____________________________________________________________________  External Attachment:    Type:   Image     Comment:   External Document

## 2010-05-13 NOTE — Progress Notes (Signed)
Summary: PT order  Phone Note From Other Clinic   Caller: Nurse, Efraim Kaufmann from [facility] Call For: Dr Cato Mulligan Summary of Call: probably needs walker. Can they have order for PT, eval & treat, fax to [phone] [facility] ph- [phone] Initial call taken by: Raechel Ache, RN,  May 31, 2009 3:53 PM  Follow-up for Phone Call        ok Follow-up by: Birdie Sons MD,  June 03, 2009 10:56 AM  Additional Follow-up for Phone Call Additional follow up Details #1::        order faxed. Additional Follow-up by: Raechel Ache, RN,  June 03, 2009 11:10 AM

## 2010-05-13 NOTE — Assessment & Plan Note (Signed)
Summary: UTI/RCD   Vital Signs:  Patient profile:   77 year old female Height:      65 inches (165.10 cm) Weight:      149 pounds (67.73 kg) BMI:     24.88 O2 Sat:      96 % on Room air Temp:     99.2 degrees F (37.33 degrees C) oral Pulse rate:   101 / minute BP sitting:   128 / 70  (left arm) Cuff size:   regular  Vitals Entered By: Josph Macho RMA (December 20, 2009 3:02 PM)  O2 Flow:  Room air CC: UTI/ CF   History of Present Illness: patient is a 77 year old Caucasian female who is in today with urinary complaints. Over the last several days she has noted an increase in urinary frequency, urgency and dysuria. She has a history of constipation in the past and has had some mild troubles this week. She has a documented history of previous urinary tract infections and hematuria as well. She denies frequent symptoms. Her last episode was in July of 2011 and she noted some gross hematuria which was anergic care over a weekend and was given ciprofloxacin. She responded well to ciprofloxacin and has not had any further symptoms until this time. She denies fevers chills abdominal or back pain. She denies any other recent illness shortness of breath chest pain or palpitations. Earlier in the week she had not had a number of days he took some prune juice and has actual episode of fecal incontinence and diarrhea. Shortly after that episode she began to notice recurrent urinary symptoms.  Current Medications (verified): 1)  Norvasc 5 Mg  Tabs (Amlodipine Besylate) .... One By Mouth Daily 2)  Boniva 150 Mg  Tabs (Ibandronate Sodium) .... One Monthly 3)  Aspir-81 81 Mg Tbec (Aspirin) .... Take 1 Tablet By Mouth Once A Day 4)  Vitamin C 500 Mg Tabs (Ascorbic Acid) .... Take 1 Once A Day 5)  Caltrate 600+d 600-400 Mg-Unit  Tabs (Calcium Carbonate-Vitamin D) .... Once Daily 6)  Centrum Silver   Tabs (Multiple Vitamins-Minerals) .... Once Daily 7)  Proair Hfa 108 (90 Base) Mcg/act  Aers  (Albuterol Sulfate) .... Use Qid As Directed 8)  Diclofenac Sodium 50 Mg Tbec (Diclofenac Sodium) .... Take 1 Tablet By Mouth Three Times A Day As Needed For Hip/leg Pain 9)  Losartan Potassium 100 Mg Tabs (Losartan Potassium) .... Take 1 Tablet By Mouth Once A Day 10)  Toviaz 4 Mg Xr24h-Tab (Fesoterodine Fumarate) .... Take 1 Tablet By Mouth Once A Day 11)  Cyclobenzaprine Hcl 10 Mg  Tabs (Cyclobenzaprine Hcl) .Marland Kitchen.. 1 By Mouth 2 Times Daily As Needed For Back Pain 12)  Lorazepam 0.5 Mg Tabs (Lorazepam) .... Take One Hour Prior To Mri  Allergies (verified): 1)  Amoxicillin (Amoxicillin) 2)  Penicillin V Potassium (Penicillin V Potassium) 3)  Sulfamethoxazole (Sulfamethoxazole)  Past History:  Past medical history reviewed for relevance to current acute and chronic problems. Social history (including risk factors) reviewed for relevance to current acute and chronic problems.  Past Medical History: Reviewed history from 11/22/2006 and no changes required. Allergic rhinitis Asthma GERD Hyperlipidemia Hypertension Osteoporosis Transient ischemic attack, hx of Urinary incontinence DDD-neck cerebral small vessel disease Anemia-NOS Depression Headache  Social History: Reviewed history from 11/22/2006 and no changes required. Single Never Smoked Retired Alcohol use-no Drug use-no Regular exercise-yes  Review of Systems      See HPI  Physical Exam  General:  Well-developed,well-nourished,in no  acute distress; alert,appropriate and cooperative throughout examination Head:  Normocephalic and atraumatic without obvious abnormalities. No apparent alopecia or balding. Neck:  No deformities, masses, or tenderness noted. Lungs:  Normal respiratory effort, chest expands symmetrically. Lungs are clear to auscultation, no crackles or wheezes. Heart:  Normal rate and regular rhythm. S1 and S2 normal without gallop, murmur, click, rub or other extra sounds. Abdomen:  Bowel sounds  positive,abdomen soft and non-tender without masses, organomegaly or hernias noted. Extremities:  No clubbing, cyanosis, edema, or deformity noted   Psych:  Cognition and judgment appear intact. Alert and cooperative with normal attention span and concentration. No apparent delusions, illusions, hallucinations   Impression & Recommendations:  Problem # 1:  HYPERTENSION (ICD-401.9)  Her updated medication list for this problem includes:    Norvasc 5 Mg Tabs (Amlodipine besylate) ..... One by mouth daily    Losartan Potassium 100 Mg Tabs (Losartan potassium) .Marland Kitchen... Take 1 tablet by mouth once a day Well controlled no change in meds today  Problem # 2:  FREQUENCY, URINARY (ICD-788.41)  Her updated medication list for this problem includes:    Toviaz 4 Mg Xr24h-tab (Fesoterodine fumarate) .Marland Kitchen... Take 1 tablet by mouth once a day    Pyridium 200 Mg Tabs (Phenazopyridine hcl) .Marland Kitchen... 1 tab by mouth three times a day x 2 days as needed pain  Orders: UA Dipstick w/o Micro (automated)  (81003) Prescription Created Electronically [phone]) T-Urine Culture ([facility]rum Order) [id number]) push clear fluids, start Ciprofloxacin  Problem # 3:  CONSTIPATION (ICD-564.00) Mild, continue Activia and encouraged adequate hydration and a daily fiber supplement such as Benefiber, may continue to use prune juice prn  Complete Medication List: 1)  Norvasc 5 Mg Tabs (Amlodipine besylate) .... One by mouth daily 2)  Boniva 150 Mg Tabs (Ibandronate sodium) .... One monthly 3)  Aspir-81 81 Mg Tbec (Aspirin) .... Take 1 tablet by mouth once a day 4)  Vitamin C 500 Mg Tabs (Ascorbic acid) .... Take 1 once a day 5)  Caltrate 600+d 600-400 Mg-unit Tabs (Calcium carbonate-vitamin d) .... Once daily 6)  Centrum Silver Tabs (Multiple vitamins-minerals) .... Once daily 7)  Proair Hfa 108 (90 Base) Mcg/act Aers (Albuterol sulfate) .... Use qid as directed 8)  Diclofenac Sodium 50 Mg Tbec (Diclofenac sodium) .... Take 1  tablet by mouth three times a day as needed for hip/leg pain 9)  Losartan Potassium 100 Mg Tabs (Losartan potassium) .... Take 1 tablet by mouth once a day 10)  Toviaz 4 Mg Xr24h-tab (Fesoterodine fumarate) .... Take 1 tablet by mouth once a day 11)  Cyclobenzaprine Hcl 10 Mg Tabs (Cyclobenzaprine hcl) .Marland Kitchen.. 1 by mouth 2 times daily as needed for back pain 12)  Lorazepam 0.5 Mg Tabs (Lorazepam) .... Take one hour prior to mri 13)  Cipro 250 Mg Tabs (Ciprofloxacin hcl) .Marland Kitchen.. 1 tab by mouth two times a day x 5 days 14)  Pyridium 200 Mg Tabs (Phenazopyridine hcl) .Marland Kitchen.. 1 tab by mouth three times a day x 2 days as needed pain  Patient Instructions: 1)  Drink plenty of fluids up to 3-4 quarts a day. Cranberry juice is especially recommended in addition to large amounts of water. Avoid caffeine & carbonated drinks, they tend to irritate the bladder, Return in 3-5 days if you're not better: sooner if you're feeling worse.  2)  Take your antibiotic as prescribed until ALL of it is gone, but stop if you develop a rash or swelling and contact our office as soon  as possible.  3)  Consider adding Benefiber 2 tsp by mouth once daily to your Activia yogurt or a beverage daily Prescriptions: PYRIDIUM 200 MG TABS (PHENAZOPYRIDINE HCL) 1 tab by mouth three times a day x 2 days as needed pain  #6 x 1   Entered and Authorized by:   Danise Edge MD   Signed by:   Danise Edge MD on 12/20/2009   Method used:   Electronically to        [facility]* (retail)       [address]       [place], [place]  [address]       Ph: [phone]       Fax: [phone]   RxID:   [id number] CIPRO 250 MG TABS (CIPROFLOXACIN HCL) 1 tab by mouth two times a day x 5 days  #10 x 1   Entered and Authorized by:   Danise Edge MD   Signed by:   Danise Edge MD on 12/20/2009   Method used:   Electronically to        [facility]* (retail)       [address]       [place], [place]  [id number]        Ph: [phone]       Fax: [phone]   RxID:   [id number]   Laboratory Results   Urine Tests    Routine Urinalysis   Color: yellow Appearance: Clear Glucose: negative   (Normal Range: Negative) Bilirubin: negative   (Normal Range: Negative) Ketone: trace (5)   (Normal Range: Negative) Spec. Gravity: 1.025   (Normal Range: 1.003-1.035) Blood: 3+   (Normal Range: Negative) pH: 5.5   (Normal Range: 5.0-8.0) Protein: 2+   (Normal Range: Negative) Urobilinogen: 0.2   (Normal Range: 0-1) Nitrite: positive   (Normal Range: Negative) Leukocyte Esterace: 1+   (Normal Range: Negative)    Comments: Rita Ohara  December 20, 2009 2:52 PM

## 2010-05-13 NOTE — Progress Notes (Signed)
Summary: pro-air  Phone Note Refill Request Message from:  Fax from Pharmacy  Refills Requested: Medication #1:  PROAIR HFA 108 (90 BASE) MCG/ACT  AERS use qid as directed [facility]---[phone]     FAX---[phone]  Initial call taken by: Warnell Forester,  May 28, 2009 11:25 AM    Prescriptions: PROAIR HFA 108 (90 BASE) MCG/ACT  AERS (ALBUTEROL SULFATE) use qid as directed  #3 x 1   Entered by:   Gladis Riffle, RN   Authorized by:   Birdie Sons MD   Signed by:   Gladis Riffle, RN on 05/28/2009   Method used:   Electronically to        [facility]* (retail)       [address]       [place], [place]  [id number]       Ph: [phone]       Fax: [phone]   RxID:   [id number]

## 2010-05-13 NOTE — Assessment & Plan Note (Signed)
Summary: consult re: sciatica/cjr   Vital Signs:  Patient profile:   77 year old female Weight:      150 pounds Temp:     97.8 degrees F oral Pulse rate:   76 / minute Pulse rhythm:   regular Resp:     12 per minute BP sitting:   132 / 70  (left arm) Cuff size:   regular  Vitals Entered By: Gladis Riffle, RN (October 18, 2009 12:09 PM) CC: discuss sciatica- Is Patient Diabetic? No   CC:  discuss sciatica-.  History of Present Illness: Right leg discomfort---ongoing intermittently for at least two years 09/09/09 she developed increased right leg discomfort---starte diclfenac at that time---sxs improved no weakness no falls no trauma as cause of sxs All other systems reviewed and were negative   Preventive Screening-Counseling & Management  Alcohol-Tobacco     Smoking Status: never  Current Problems (verified): 1)  Sciatica  (ICD-724.3) 2)  Unsteady Gait  (ICD-781.2) 3)  Common Migraine  (ICD-346.10) 4)  Depression  (ICD-311) 5)  Anemia-nos  (ICD-285.9) 6)  Transient Ischemic Attack, Hx of  (ICD-V12.50) 7)  Osteoporosis  (ICD-733.00) 8)  Hypertension  (ICD-401.9) 9)  Hyperlipidemia  (ICD-272.4) 10)  Gerd  (ICD-530.81) 11)  Asthma  (ICD-493.90)  Current Medications (verified): 1)  Norvasc 5 Mg  Tabs (Amlodipine Besylate) .... One By Mouth Daily 2)  Boniva 150 Mg  Tabs (Ibandronate Sodium) .... One Monthly 3)  Aspir-81 81 Mg Tbec (Aspirin) .... Take 1 Tablet By Mouth Once A Day 4)  Vitamin C 500 Mg Tabs (Ascorbic Acid) .... Take 1 Once A Day 5)  Caltrate 600+d 600-400 Mg-Unit  Tabs (Calcium Carbonate-Vitamin D) .... Once Daily 6)  Centrum Silver   Tabs (Multiple Vitamins-Minerals) .... Once Daily 7)  Proair Hfa 108 (90 Base) Mcg/act  Aers (Albuterol Sulfate) .... Use Qid As Directed 8)  Diclofenac Sodium 50 Mg Tbec (Diclofenac Sodium) .... Take 1 Tablet By Mouth Three Times A Day As Needed For Hip/leg Pain 9)  Losartan Potassium 100 Mg Tabs (Losartan Potassium) .... Take  1 Tablet By Mouth Once A Day 10)  Toviaz 4 Mg Xr24h-Tab (Fesoterodine Fumarate) .... Take 1 Tablet By Mouth Once A Day  Allergies: 1)  Amoxicillin (Amoxicillin) 2)  Penicillin V Potassium (Penicillin V Potassium) 3)  Sulfamethoxazole (Sulfamethoxazole)  Past History:  Past Medical History: Last updated: 10-Dec-2006 Allergic rhinitis Asthma GERD Hyperlipidemia Hypertension Osteoporosis Transient ischemic attack, hx of Urinary incontinence DDD-neck cerebral small vessel disease Anemia-NOS Depression Headache  Past Surgical History: Last updated: 12-10-06 Appendectomy Hysterectomy Tonsillectomy Cholecystectomy  Family History: Last updated: December 10, 2006 first husband deceased with lung CANCER mother deceased with CHF 61 yo father deceased pancreatic CA 67 Brother deceased with leukemia 75 yo Family History of Alcoholism/Addiction Family History of Arthritis Family History Breast cancer 1st degree relative <50 Family History Diabetes 1st degree relative Family History High cholesterol Family History Hypertension Family History Other cancer Family History of Stroke F 1st degree relative <60 Family History of Cardiovascular disorder  Social History: Last updated: 12-10-2006 Single Never Smoked Retired Alcohol use-no Drug use-no Regular exercise-yes  Risk Factors: Exercise: yes (10-Dec-2006)  Risk Factors: Smoking Status: never (10/18/2009)  Physical Exam  General:  alert and well-developed.   Head:  normocephalic and atraumatic.   Eyes:  pupils equal and pupils round.   Ears:  R ear normal and L ear normal.   Neck:  No deformities, masses, or tenderness noted. Chest Wall:  No deformities, masses,  or tenderness noted. Lungs:  normal respiratory effort and no intercostal retractions.   Heart:  normal rate and regular rhythm.   Abdomen:  soft and non-tender.   Msk:  No deformity or scoliosis noted of thoracic or lumbar spine.   Neurologic:  cranial  nerves II-XII intact and gait normal.   Skin:  turgor normal and color normal.   Psych:  good eye contact and not anxious appearing.     Impression & Recommendations:  Problem # 1:  SCIATICA (ICD-724.3)  progressive sxs really not having a lot of success with conservative treatment she describes back and leg pain sxs ongoing for years and significantly worse to the point that she uses a walker for the past 6 weeks Her updated medication list for this problem includes:    Aspir-81 81 Mg Tbec (Aspirin) .Marland Kitchen... Take 1 tablet by mouth once a day    Diclofenac Sodium 50 Mg Tbec (Diclofenac sodium) .Marland Kitchen... Take 1 tablet by mouth three times a day as needed for hip/leg pain    Cyclobenzaprine Hcl 10 Mg Tabs (Cyclobenzaprine hcl) .Marland Kitchen... 1 by mouth 2 times daily as needed for back pain  Orders: Radiology Referral (Radiology)  Complete Medication List: 1)  Norvasc 5 Mg Tabs (Amlodipine besylate) .... One by mouth daily 2)  Boniva 150 Mg Tabs (Ibandronate sodium) .... One monthly 3)  Aspir-81 81 Mg Tbec (Aspirin) .... Take 1 tablet by mouth once a day 4)  Vitamin C 500 Mg Tabs (Ascorbic acid) .... Take 1 once a day 5)  Caltrate 600+d 600-400 Mg-unit Tabs (Calcium carbonate-vitamin d) .... Once daily 6)  Centrum Silver Tabs (Multiple vitamins-minerals) .... Once daily 7)  Proair Hfa 108 (90 Base) Mcg/act Aers (Albuterol sulfate) .... Use qid as directed 8)  Diclofenac Sodium 50 Mg Tbec (Diclofenac sodium) .... Take 1 tablet by mouth three times a day as needed for hip/leg pain 9)  Losartan Potassium 100 Mg Tabs (Losartan potassium) .... Take 1 tablet by mouth once a day 10)  Toviaz 4 Mg Xr24h-tab (Fesoterodine fumarate) .... Take 1 tablet by mouth once a day 11)  Cyclobenzaprine Hcl 10 Mg Tabs (Cyclobenzaprine hcl) .Marland Kitchen.. 1 by mouth 2 times daily as needed for back pain Prescriptions: CYCLOBENZAPRINE HCL 10 MG  TABS (CYCLOBENZAPRINE HCL) 1 by mouth 2 times daily as needed for back pain  #20 x 1    Entered and Authorized by:   Birdie Sons MD   Signed by:   Birdie Sons MD on 10/18/2009   Method used:   Electronically to        [facility]* (retail)       [address]       [place], [place]  [id number]       Ph: [phone]       Fax: [phone]   RxID:   [id number]

## 2010-05-13 NOTE — Progress Notes (Signed)
Summary: does need tranzuilizer - is clautrophic  Phone Note Call from Patient   Caller: Patient Call For: Birdie Sons MD Summary of Call: Pt needs nerve med sent to [facility] for MRI next week.  Please ask to have it delivered. Pt is being treated for cystitis this weekend and will be taking Cipro this week. Initial call taken by: Lynann Beaver CMA,  October 21, 2009 11:00 AM  Follow-up for Phone Call        no need for nerve pill Follow-up by: Birdie Sons MD,  October 21, 2009 1:38 PM  Additional Follow-up for Phone Call Additional follow up Details #1::        [facility] Additional Follow-up by: Lynann Beaver CMA,  October 21, 2009 1:55 PM    Additional Follow-up for Phone Call Additional follow up Details #2::    Patient called back & said she does need a tranquilizer because she is clautrophobic.  [facility] & have it delivered.  Allergic sulfa & pcn.   Follow-up by: Rudy Jew, RN,  October 21, 2009 4:15 PM  Additional Follow-up for Phone Call Additional follow up Details #3:: Details for Additional Follow-up Action Taken: lorazepam 0.5 mg 1 hour prior to MRI  Med called in & patient notified. Rudy Jew, RN  October 21, 2009 4:16 PM  Additional Follow-up by: Birdie Sons MD,  October 21, 2009 4:05 PM  New/Updated Medications: LORAZEPAM 0.5 MG TABS (LORAZEPAM) Take one hour prior to MRI Prescriptions: LORAZEPAM 0.5 MG TABS (LORAZEPAM) Take one hour prior to MRI  #1 x 0   Entered by:   Rudy Jew, RN   Authorized by:   Birdie Sons MD   Signed by:   Rudy Jew, RN on 10/21/2009   Method used:   Telephoned to ...       [facility]* (retail)       [address]       [place], [place]  [id number]       Ph: [phone]       Fax: [phone]   RxID:   [id number]

## 2010-05-15 NOTE — Assessment & Plan Note (Signed)
Summary: ROA/FUP/RCD   Vital Signs:  Patient profile:   77 year old female Weight:      150 pounds Temp:     98.5 degrees F oral BP sitting:   110 / 70  (left arm) Cuff size:   regular  Vitals Entered By: Alfred Levins, CMA (April 28, 2010 9:21 AM) CC: f/u   CC:  f/u.  History of Present Illness:  Follow-Up Visit      This is an 77 year old woman who presents for Follow-up visit.  The patient denies chest pain and palpitations.  Since the last visit the patient notes no new problems or concerns.  The patient reports taking meds as prescribed.  When questioned about possible medication side effects, the patient notes none.  pt uses walker but is frequently noncompliant. she has an occasional fall---she has had PT for fall prevention previously  All other systems reviewed and were negative except for chronic fatigue    Current Problems (verified): 1)  Degenerative Disc Disease  (ICD-722.6) 2)  Scoliosis  (ICD-737.30) 3)  Spondylosis, Lumbar  (ICD-721.3) 4)  Depression  (ICD-311) 5)  Anemia-nos  (ICD-285.9) 6)  Transient Ischemic Attack, Hx of  (ICD-V12.50) 7)  Osteoporosis  (ICD-733.00) 8)  Hypertension  (ICD-401.9) 9)  Hyperlipidemia  (ICD-272.4) 10)  Gerd  (ICD-530.81) 11)  Asthma  (ICD-493.90)  Current Medications (verified): 1)  Norvasc 5 Mg  Tabs (Amlodipine Besylate) .... One By Mouth Daily 2)  Boniva 150 Mg  Tabs (Ibandronate Sodium) .... One Monthly 3)  Aspir-81 81 Mg Tbec (Aspirin) .... Take 1 Tablet By Mouth Once A Day 4)  Vitamin C 500 Mg Tabs (Ascorbic Acid) .... Take 1 Once A Day 5)  Caltrate 600+d 600-400 Mg-Unit  Tabs (Calcium Carbonate-Vitamin D) .... Once Daily 6)  Centrum Silver   Tabs (Multiple Vitamins-Minerals) .... Once Daily 7)  Proair Hfa 108 (90 Base) Mcg/act  Aers (Albuterol Sulfate) .... Use Qid As Directed 8)  Losartan Potassium 100 Mg Tabs (Losartan Potassium) .... Take 1 Tablet By Mouth Once A Day  Allergies (verified): 1)  Amoxicillin  (Amoxicillin) 2)  Penicillin V Potassium (Penicillin V Potassium) 3)  Sulfamethoxazole (Sulfamethoxazole)  Past History:  Past Medical History: Last updated: 2006-12-14 Allergic rhinitis Asthma GERD Hyperlipidemia Hypertension Osteoporosis Transient ischemic attack, hx of Urinary incontinence DDD-neck cerebral small vessel disease Anemia-NOS Depression Headache  Past Surgical History: Last updated: 12-14-06 Appendectomy Hysterectomy Tonsillectomy Cholecystectomy  Family History: Last updated: 12/14/06 first husband deceased with lung CANCER mother deceased with CHF 49 yo father deceased pancreatic CA 39 Brother deceased with leukemia 77 yo Family History of Alcoholism/Addiction Family History of Arthritis Family History Breast cancer 1st degree relative <50 Family History Diabetes 1st degree relative Family History High cholesterol Family History Hypertension Family History Other cancer Family History of Stroke F 1st degree relative <60 Family History of Cardiovascular disorder  Social History: Last updated: Dec 14, 2006 Single Never Smoked Retired Alcohol use-no Drug use-no Regular exercise-yes  Risk Factors: Exercise: yes (12-14-06)  Risk Factors: Smoking Status: never (10/18/2009)  Physical Exam  General:  elderly female in no acute distress. HEENT exam atraumatic, normocephalic, neck supple. . Her gait is steady with a walker. Chest clear auscultationt. Extremities no clubbing cyanosis or edema.  Lungs:  no intercostal retractions.   Heart:  normal rate and regular rhythm.     Impression & Recommendations:  Problem # 1:  ABNORMALITY OF GAIT (ICD-781.2) stable continue use of walker  Problem # 2:  HYPERLIPIDEMIA (ICD-272.4)  no meds Labs Reviewed: SGOT: 19 (12/08/2007)   SGPT: 14 (12/08/2007)   HDL:70.6 (12/08/2007), 65.6 (09/29/2006)  LDL:108 (12/08/2007), DEL (09/29/2006)  Chol:204 (12/08/2007), 232 (09/29/2006)  Trig:127  (12/08/2007), 148 (09/29/2006)  Problem # 3:  HYPERTENSION (ICD-401.9) controlled continue current medications  Her updated medication list for this problem includes:    Norvasc 5 Mg Tabs (Amlodipine besylate) ..... One by mouth daily    Losartan Potassium 100 Mg Tabs (Losartan potassium) .Marland Kitchen... Take 1 tablet by mouth once a day  BP today: 110/70 Prior BP: 134/86 (03/25/2010)  Labs Reviewed: K+: 4.6 (12/30/2009) Creat: : 0.8 (12/30/2009)   Chol: 204 (12/08/2007)   HDL: 70.6 (12/08/2007)   LDL: 108 (12/08/2007)   TG: 127 (12/08/2007)  Complete Medication List: 1)  Norvasc 5 Mg Tabs (Amlodipine besylate) .... One by mouth daily 2)  Boniva 150 Mg Tabs (Ibandronate sodium) .... One monthly 3)  Aspir-81 81 Mg Tbec (Aspirin) .... Take 1 tablet by mouth once a day 4)  Vitamin C 500 Mg Tabs (Ascorbic acid) .... Take 1 once a day 5)  Caltrate 600+d 600-400 Mg-unit Tabs (Calcium carbonate-vitamin d) .... Once daily 6)  Centrum Silver Tabs (Multiple vitamins-minerals) .... Once daily 7)  Proair Hfa 108 (90 Base) Mcg/act Aers (Albuterol sulfate) .... Use qid as directed 8)  Losartan Potassium 100 Mg Tabs (Losartan potassium) .... Take 1 tablet by mouth once a day  Other Orders: Venipuncture (82956) T-Vitamin D (25-Hydroxy) (21308-65784) Pneumococcal Vaccine (69629) Admin 1st Vaccine (52841) Specimen Handling (32440) TLB-BMP (Basic Metabolic Panel-BMET) (80048-METABOL)   Orders Added: 1)  Est. Patient Level IV [10272] 2)  Venipuncture [53664] 3)  T-Vitamin D (25-Hydroxy) [40347-42595] 4)  Pneumococcal Vaccine [90732] 5)  Admin 1st Vaccine [90471] 6)  Specimen Handling [99000] 7)  TLB-BMP (Basic Metabolic Panel-BMET) [80048-METABOL]   Immunizations Administered:  Pneumonia Vaccine:    Vaccine Type: Pneumovax (Medicare)    Site: left deltoid    Mfr: Merck    Dose: 0.5 ml    Route: IM    Given by: Alfred Levins, CMA    Exp. Date: 06/26/2011    Lot #: [id number]   Immunizations  Administered:  Pneumonia Vaccine:    Vaccine Type: Pneumovax (Medicare)    Site: left deltoid    Mfr: Merck    Dose: 0.5 ml    Route: IM    Given by: Alfred Levins, CMA    Exp. Date: 06/26/2011    Lot #: [id number]

## 2010-05-15 NOTE — Assessment & Plan Note (Signed)
Summary: muscle pain/njr   Vital Signs:  Patient profile:   77 year old female Weight:      150 pounds Temp:     98.1 degrees F oral Pulse rate:   80 / minute Pulse rhythm:   regular BP sitting:   134 / 86  (left arm) Cuff size:   regular  Vitals Entered By: Alfred Levins, CMA (March 25, 2010 8:41 AM) CC: lt side pain, fell down 1wk ago   CC:  lt side pain and fell down 1wk ago.  History of Present Illness: fell one week ago. Was in bathroom, while turning water on in bathtub the bathmat slipped and she fell onelbow and left knee. She had dificulty standing---had to scoot out of bathroom and eventually was able to use a piece of furniture to stand/sit. She has done well since that time. She did not suffer any neurologic deficits sxs.  Since that time she has developed some left hip pain. Hip pain has waxed and waned. No rash.    she uses walker all of the time  All other systems reviewed and were negative   Current Problems (verified): 1)  Degenerative Disc Disease  (ICD-722.6) 2)  Scoliosis  (ICD-737.30) 3)  Spondylosis, Lumbar  (ICD-721.3) 4)  Depression  (ICD-311) 5)  Anemia-nos  (ICD-285.9) 6)  Transient Ischemic Attack, Hx of  (ICD-V12.50) 7)  Osteoporosis  (ICD-733.00) 8)  Hypertension  (ICD-401.9) 9)  Hyperlipidemia  (ICD-272.4) 10)  Gerd  (ICD-530.81) 11)  Asthma  (ICD-493.90)  Current Medications (verified): 1)  Norvasc 5 Mg  Tabs (Amlodipine Besylate) .... One By Mouth Daily 2)  Boniva 150 Mg  Tabs (Ibandronate Sodium) .... One Monthly 3)  Aspir-81 81 Mg Tbec (Aspirin) .... Take 1 Tablet By Mouth Once A Day 4)  Vitamin C 500 Mg Tabs (Ascorbic Acid) .... Take 1 Once A Day 5)  Caltrate 600+d 600-400 Mg-Unit  Tabs (Calcium Carbonate-Vitamin D) .... Once Daily 6)  Centrum Silver   Tabs (Multiple Vitamins-Minerals) .... Once Daily 7)  Proair Hfa 108 (90 Base) Mcg/act  Aers (Albuterol Sulfate) .... Use Qid As Directed 8)  Losartan Potassium 100 Mg Tabs  (Losartan Potassium) .... Take 1 Tablet By Mouth Once A Day 9)  Trimethoprim 100 Mg Tabs (Trimethoprim) .Marland Kitchen.. 1 By Mouth At Bedtime  Allergies (verified): 1)  Amoxicillin (Amoxicillin) 2)  Penicillin V Potassium (Penicillin V Potassium) 3)  Sulfamethoxazole (Sulfamethoxazole)  Past History:  Past Medical History: Last updated: 12/19/06 Allergic rhinitis Asthma GERD Hyperlipidemia Hypertension Osteoporosis Transient ischemic attack, hx of Urinary incontinence DDD-neck cerebral small vessel disease Anemia-NOS Depression Headache  Past Surgical History: Last updated: 12-19-2006 Appendectomy Hysterectomy Tonsillectomy Cholecystectomy  Family History: Last updated: December 19, 2006 first husband deceased with lung CANCER mother deceased with CHF 75 yo father deceased pancreatic CA 46 Brother deceased with leukemia 36 yo Family History of Alcoholism/Addiction Family History of Arthritis Family History Breast cancer 1st degree relative <50 Family History Diabetes 1st degree relative Family History High cholesterol Family History Hypertension Family History Other cancer Family History of Stroke F 1st degree relative <60 Family History of Cardiovascular disorder  Social History: Last updated: 12/19/2006 Single Never Smoked Retired Alcohol use-no Drug use-no Regular exercise-yes  Risk Factors: Exercise: yes (2006-12-19)  Risk Factors: Smoking Status: never (10/18/2009)  Physical Exam  General:  elderly female in no acute distress. HEENT exam atraumatic, normocephalic, neck supple. Has full range of motion of her shoulders. Her gait is steady with a walker. Chest clear auscultation she  does have discomfort to palpation of the lower ribs on the left. Extremities no clubbing cyanosis or edema. She is full range of motion of her hips, knees, ankles. Full range of motion of both elbows.   Impression & Recommendations:  Problem # 1:  RIB PAIN (ICD-786.50) after  fall one week ago could be fractured but exam otherwise is unremarkable she has already had therapy for fall prevention (pt's report). This fall was accidental and probably could not have been prevented even with therapy.   Problem # 2:  HYPERTENSION (ICD-401.9) reasonable control Her updated medication list for this problem includes:    Norvasc 5 Mg Tabs (Amlodipine besylate) ..... One by mouth daily    Losartan Potassium 100 Mg Tabs (Losartan potassium) .Marland Kitchen... Take 1 tablet by mouth once a day  BP today: 134/86 Prior BP: 120/76 (12/27/2009)  Labs Reviewed: K+: 4.6 (12/30/2009) Creat: : 0.8 (12/30/2009)   Chol: 204 (12/08/2007)   HDL: 70.6 (12/08/2007)   LDL: 108 (12/08/2007)   TG: 127 (12/08/2007)  Problem # 3:  HYPERLIPIDEMIA (ICD-272.4) no meds indicated Labs Reviewed: SGOT: 19 (12/08/2007)   SGPT: 14 (12/08/2007)   HDL:70.6 (12/08/2007), 65.6 (09/29/2006)  LDL:108 (12/08/2007), DEL (09/29/2006)  Chol:204 (12/08/2007), 232 (09/29/2006)  Trig:127 (12/08/2007), 148 (09/29/2006)  Complete Medication List: 1)  Norvasc 5 Mg Tabs (Amlodipine besylate) .... One by mouth daily 2)  Boniva 150 Mg Tabs (Ibandronate sodium) .... One monthly 3)  Aspir-81 81 Mg Tbec (Aspirin) .... Take 1 tablet by mouth once a day 4)  Vitamin C 500 Mg Tabs (Ascorbic acid) .... Take 1 once a day 5)  Caltrate 600+d 600-400 Mg-unit Tabs (Calcium carbonate-vitamin d) .... Once daily 6)  Centrum Silver Tabs (Multiple vitamins-minerals) .... Once daily 7)  Proair Hfa 108 (90 Base) Mcg/act Aers (Albuterol sulfate) .... Use qid as directed 8)  Losartan Potassium 100 Mg Tabs (Losartan potassium) .... Take 1 tablet by mouth once a day 9)  Trimethoprim 100 Mg Tabs (Trimethoprim) .Marland Kitchen.. 1 by mouth at bedtime 10)  Tramadol Hcl 50 Mg Tabs (Tramadol hcl) .... Take 1 tablet by mouth two times a day as needed rib pain  Patient Instructions: 1)  . Prescriptions: TRAMADOL HCL 50 MG  TABS (TRAMADOL HCL) Take 1 tablet by mouth  two times a day as needed rib pain  #20 x 0   Entered and Authorized by:   Birdie Sons MD   Signed by:   Birdie Sons MD on 03/25/2010   Method used:   Electronically to        [facility]* (retail)       [address]       [place], [place]  [id number]       Ph: [phone]       Fax: [phone]   RxID:   [id number]    Orders Added: 1)  Est. Patient Level IV [24401]

## 2010-05-15 NOTE — Letter (Signed)
Summary: [facility]  [facility]   Imported By: Maryln Gottron 03/27/2010 12:38:50  _____________________________________________________________________  External Attachment:    Type:   Image     Comment:   External Document

## 2010-08-08 ENCOUNTER — Ambulatory Visit: Payer: Self-pay | Admitting: Internal Medicine

## 2010-08-12 ENCOUNTER — Other Ambulatory Visit: Payer: Self-pay | Admitting: Internal Medicine

## 2010-08-29 NOTE — Cardiovascular Report (Signed)
Brandy Crawford, Brandy Crawford                       ACCOUNT NO.:  [id number]   MEDICAL RECORD NO.:  [id number]                   PATIENT TYPE:  OIB   LOCATION:  2899                                 FACILITY:  [facility]   PHYSICIAN:  Madaline Savage, M.D.             DATE OF BIRTH:  1923/04/18   DATE OF PROCEDURE:  12/19/2001  DATE OF DISCHARGE:                              CARDIAC CATHETERIZATION   PROCEDURE PERFORMED:  1. Selective coronary angiography by Judkins technique.  2. Retrograde left heart catheterization.  3. Left ventricular angiography.   COMPLICATIONS:  None.   ENTRY SITE:  Right femoral.   DYE USED:  Omnipaque.   PATIENT PROFILE:  The patient is a delightful 77 year old woman who is a  patient of Dr. Arrie Aran who recently presented to our office for a  treadmill test to evaluate shortness of breath and a history of  hypertension.  She was able to walk on a Bruce protocol to complete only  Stage 1 but had weak positivity of the EKG in the inferior and anterolateral  leads and she did have some shoulder pain.  The patient was therefore  scheduled for cardiac catheterization today on an outpatient basis  electively.   RESULTS:  1. Pressures     A. The left ventricular pressure was 130/6.     B. Central aortic pressure 130/60, mean of 85.     C. No aortic valve gradient by pullback technique.  2. Angiographic results     A. The left main coronary artery was normal.     B. The left anterior descending coronary artery coursed to the cardiac        apex and gave rise to one diagonal branch which was normal as was the        LAD.     C. The circumflex was nondominant.  No lesions were seen.     D. The right coronary artery was a large and dominant vessel containing        no lesions.     E. The left ventricle was hyperdynamic.  I would consider her ejection        fraction to be approximately 70%.  No wall motion abnormalities        detected.     F. Abdominal  aorta shows no evidence of renal artery stenosis and no        evidence of abdominal aortic aneurysm.   FINAL DIAGNOSES:  1. Shortness of breath.  2. Exertional-related chest pain.  3.     False positive stress test for ischemia.  4. Angiographically patent coronary arteries with normal left ventricular     systolic function.  Ejection fraction 70%.   PLAN:  Reassurance.  Madaline Savage, M.D.    WHG/MEDQ  D:  12/19/2001  T:  12/19/2001  Job:  [id number]

## 2010-09-05 ENCOUNTER — Encounter: Payer: Self-pay | Admitting: Internal Medicine

## 2010-09-05 ENCOUNTER — Ambulatory Visit (INDEPENDENT_AMBULATORY_CARE_PROVIDER_SITE_OTHER): Payer: Medicare Other | Admitting: Internal Medicine

## 2010-09-05 DIAGNOSIS — M47817 Spondylosis without myelopathy or radiculopathy, lumbosacral region: Secondary | ICD-10-CM

## 2010-09-05 DIAGNOSIS — I10 Essential (primary) hypertension: Secondary | ICD-10-CM

## 2010-09-05 DIAGNOSIS — E785 Hyperlipidemia, unspecified: Secondary | ICD-10-CM

## 2010-09-05 NOTE — Assessment & Plan Note (Signed)
Reasonable control Continue same meds 

## 2010-09-05 NOTE — Assessment & Plan Note (Signed)
No treatment necessary

## 2010-09-05 NOTE — Assessment & Plan Note (Signed)
Has regular f/u with dr Madelon Lips

## 2010-09-05 NOTE — Progress Notes (Signed)
  Subjective:    Patient ID: Brandy Crawford, female    DOB: Aug 11, 1923, 77 y.o.   MRN: [id number]  HPI  htn---tolerating meds without difficulty  Osteoporosis---off boniva  Gait abnormality---uses walker  Asthma---needs inhaler (almost never use it)  5 days ago while getting out of bed had severe back and neck pain---took low dose acetaminophen with relief. Has seen dr caffrey  uti sxs---has seen urolgy---finishing cipro today  Past Medical History  Diagnosis Date  . Asthma   . GERD (gastroesophageal reflux disease)   . Hypertension   . Osteoporosis   . TIA (transient ischemic attack)   . Hyperlipemia   . Cervical disc disease   . Anemia   . Depression    Past Surgical History  Procedure Date  . Appendectomy   . General hysterectomy   . Tonsillectomy   . Cholecystectomy     reports that she has never smoked. She does not have any smokeless tobacco history on file. She reports that she does not drink alcohol or use illicit drugs. family history includes Alcohol abuse in an unspecified family member; Diabetes type II in an unspecified family member; Heart failure (age of onset:104) in her mother; Leukemia (age of onset:65) in her brother; and Pancreatic cancer (age of onset:52) in her father. Allergies  Allergen Reactions  . Amoxicillin     REACTION: unspecified  . Penicillins     REACTION: rash  . Sulfamethoxazole     REACTION: unspecified     Review of Systems  patient denies chest pain, shortness of breath, orthopnea. Denies lower extremity edema, abdominal pain, change in appetite, change in bowel movements. Patient denies rashes, musculoskeletal complaints. No other specific complaints in a complete review of systems.      Objective:   Physical Exam  Well-developed well-nourished female in no acute distress. HEENT exam atraumatic, normocephalic, extraocular muscles are intact. Neck is supple. No jugular venous distention no thyromegaly. Chest clear to  auscultation without increased work of breathing. Cardiac exam S1 and S2 are regular. Abdominal exam active bowel sounds, soft, nontender. Extremities no edema. Neurologic exam she is alert without any motor sensory deficits. She uses a walker        Assessment & Plan:  htn

## 2010-10-13 ENCOUNTER — Other Ambulatory Visit: Payer: Self-pay | Admitting: *Deleted

## 2010-12-31 ENCOUNTER — Encounter: Payer: Self-pay | Admitting: Family Medicine

## 2010-12-31 ENCOUNTER — Ambulatory Visit (INDEPENDENT_AMBULATORY_CARE_PROVIDER_SITE_OTHER): Payer: Medicare Other | Admitting: Family Medicine

## 2010-12-31 VITALS — BP 138/82 | HR 93 | Temp 97.7°F | Wt 150.0 lb

## 2010-12-31 DIAGNOSIS — I10 Essential (primary) hypertension: Secondary | ICD-10-CM

## 2010-12-31 DIAGNOSIS — R42 Dizziness and giddiness: Secondary | ICD-10-CM

## 2010-12-31 MED ORDER — MECLIZINE HCL 50 MG PO TABS: 50.0000 mg | ORAL_TABLET | ORAL | Status: AC | PRN

## 2010-12-31 NOTE — Progress Notes (Signed)
  Subjective:    Patient ID: Brandy Crawford, female    DOB: 06-Nov-1923, 77 y.o.   MRN: [id number]  HPI Here for the sudden onset of dizziness and high BP this morning. She felt fine yesterday, but when she woke up this morning she noticed a sensation of the room swirling around her head whenever she was lying down. When she sits upright or stands, she feels fine. When lying down the dizziness would make her feel nauseated briefly. No HA, no vision problems, no other neurologic deficits. No SOB or chest pain. She called a nurse to check her BP at 6 am, and it was 184/86. So she took her usual Cozaar. Then at 8:30 her BP was 160/72, so she took a second Cozaar. After that her BP went down to normal. The BP has been stable since. Prior to today the last time her BP was checked was here in May.    Review of Systems  Constitutional: Negative.   HENT: Negative.   Eyes: Negative.   Respiratory: Negative.   Cardiovascular: Negative.   Neurological: Positive for dizziness. Negative for tremors, syncope, facial asymmetry, speech difficulty, weakness, light-headedness, numbness and headaches.       Objective:   Physical Exam  Constitutional: She is oriented to person, place, and time.       She walks with a walker, unsteady gait, alert  HENT:  Head: Normocephalic and atraumatic.  Eyes: Conjunctivae and EOM are normal. Pupils are equal, round, and reactive to light.       No nystagmus   Neck: Neck supple. No thyromegaly present.       No carotid bruits   Cardiovascular: Normal rate, regular rhythm, normal heart sounds and intact distal pulses.   Pulmonary/Chest: Effort normal and breath sounds normal.  Lymphadenopathy:    She has no cervical adenopathy.  Neurological: She is alert and oriented to person, place, and time. No cranial nerve deficit. She exhibits normal muscle tone.          Assessment & Plan:  This is vertigo, and we will try Meclizine to use prn. Rest today, drink plenty  of water. I think her BP went up as a response to the vertigo, but I think her underlying HTN is well controlled. I asked her nurses to check her BP twice a day for 2 weeks, and they will call us with results. If her BP truly seems to be up, we will adjust her meds.

## 2011-01-23 ENCOUNTER — Other Ambulatory Visit: Payer: Self-pay | Admitting: Internal Medicine

## 2011-02-17 ENCOUNTER — Other Ambulatory Visit: Payer: Self-pay | Admitting: Internal Medicine

## 2011-02-24 ENCOUNTER — Ambulatory Visit: Payer: Medicare Other | Admitting: *Deleted

## 2011-03-09 ENCOUNTER — Encounter: Payer: Self-pay | Admitting: Internal Medicine

## 2011-03-09 ENCOUNTER — Ambulatory Visit (INDEPENDENT_AMBULATORY_CARE_PROVIDER_SITE_OTHER): Payer: Medicare Other | Admitting: Internal Medicine

## 2011-03-09 DIAGNOSIS — IMO0002 Reserved for concepts with insufficient information to code with codable children: Secondary | ICD-10-CM

## 2011-03-09 DIAGNOSIS — E785 Hyperlipidemia, unspecified: Secondary | ICD-10-CM

## 2011-03-09 DIAGNOSIS — I10 Essential (primary) hypertension: Secondary | ICD-10-CM

## 2011-03-09 DIAGNOSIS — J45909 Unspecified asthma, uncomplicated: Secondary | ICD-10-CM

## 2011-03-09 LAB — BASIC METABOLIC PANEL
CO2: 27 mEq/L (ref 19–32)
Calcium: 10.6 mg/dL — ABNORMAL HIGH (ref 8.4–10.5)
Chloride: 107 mEq/L (ref 96–112)
Sodium: 142 mEq/L (ref 135–145)

## 2011-03-09 MED ORDER — ALBUTEROL SULFATE HFA 108 (90 BASE) MCG/ACT IN AERS: 2.0000 | INHALATION_SPRAY | Freq: Four times a day (QID) | RESPIRATORY_TRACT | Status: DC | PRN

## 2011-03-09 NOTE — Assessment & Plan Note (Signed)
Well controlled. Continue current meds

## 2011-03-09 NOTE — Assessment & Plan Note (Signed)
She sees Dr. Madelon Lips for pain meds and management

## 2011-03-09 NOTE — Assessment & Plan Note (Signed)
No reason to follow up at this time

## 2011-03-09 NOTE — Progress Notes (Signed)
Patient ID: Brandy Crawford, female   DOB: 09/21/1923, 77 y.o.   MRN: [id number] Back pain: sees dr Madelon Lips. Treating with mobic and acetaminophen.  Sleep disturbance---related to DTR in-laws illness and death.   HTN--home BPs (elevated one time) Now Bps are in the 120s/70s range  Past Medical History  Diagnosis Date  . Asthma   . GERD (gastroesophageal reflux disease)   . Hypertension   . Osteoporosis   . TIA (transient ischemic attack)   . Hyperlipemia   . Cervical disc disease   . Anemia   . Depression     History   Social History  . Marital Status: Widowed    Spouse Name: N/A    Number of Children: N/A  . Years of Education: N/A   Occupational History  . Not on file.   Social History Main Topics  . Smoking status: Never Smoker   . Smokeless tobacco: Never Used  . Alcohol Use: No  . Drug Use: No  . Sexually Active: Not on file   Other Topics Concern  . Not on file   Social History Narrative  . No narrative on file    Past Surgical History  Procedure Date  . Appendectomy   . General hysterectomy   . Tonsillectomy   . Cholecystectomy     Family History  Problem Relation Age of Onset  . Pancreatic cancer Father 30    deceased  . Heart failure Mother 104    deceased  . Leukemia Brother 65    deceased  . Diabetes type II    . Alcohol abuse      Allergies  Allergen Reactions  . Amoxicillin     REACTION: unspecified  . Penicillins     REACTION: rash  . Sulfamethoxazole     REACTION: unspecified    Current Outpatient Prescriptions on File Prior to Visit  Medication Sig Dispense Refill  . albuterol (PROAIR HFA) 108 (90 BASE) MCG/ACT inhaler Inhale 2 puffs into the lungs every 6 (six) hours as needed.        . Ascorbic Acid (VITAMIN C) 500 MG tablet Take 500 mg by mouth daily.        Marland Kitchen aspirin 81 MG chewable tablet Chew 81 mg by mouth daily.        . Calcium Carb-Vit D-Soy Isoflav (CALTRATE 600 + SOY) 600-200-25 MG-UNIT-MG TABS Take by mouth  daily.        . NORVASC 5 MG tablet TAKE 1 TABLET ONCE DAILY.  90 each  1     patient denies chest pain, shortness of breath, orthopnea. Denies lower extremity edema, abdominal pain, change in appetite, change in bowel movements. Patient denies rashes, musculoskeletal complaints. No other specific complaints in a complete review of systems.   BP 124/80  Pulse 84  Temp(Src) 98.3 F (36.8 C) (Oral)  Ht 5\' 5"  (1.651 m)  Wt 147 lb (66.679 kg)  BMI 24.46 kg/m2  Well-developed well-nourished female in no acute distress. HEENT exam atraumatic, normocephalic, extraocular muscles are intact. Neck is supple. No jugular venous distention no thyromegaly. Chest clear to auscultation without increased work of breathing. Cardiac exam S1 and S2 are regular. Abdominal exam active bowel sounds, soft, nontender. Extremities no edema. Neurologic exam she is alert.She walks with a walker.

## 2011-03-09 NOTE — Assessment & Plan Note (Signed)
Rare use of proair---will refill

## 2011-03-18 ENCOUNTER — Other Ambulatory Visit (INDEPENDENT_AMBULATORY_CARE_PROVIDER_SITE_OTHER): Payer: Medicare Other

## 2011-03-18 DIAGNOSIS — E876 Hypokalemia: Secondary | ICD-10-CM

## 2011-03-18 LAB — BASIC METABOLIC PANEL
BUN: 14 mg/dL (ref 6–23)
Chloride: 104 mEq/L (ref 96–112)
Glucose, Bld: 136 mg/dL — ABNORMAL HIGH (ref 70–99)
Potassium: 4.1 mEq/L (ref 3.5–5.1)

## 2011-03-19 LAB — PTH, INTACT AND CALCIUM: Calcium, Total (PTH): 10.2 mg/dL (ref 8.4–10.5)

## 2011-03-19 LAB — CALCIUM, IONIZED: Calcium, Ion: 1.42 mmol/L — ABNORMAL HIGH (ref 1.12–1.32)

## 2011-04-21 DIAGNOSIS — N3941 Urge incontinence: Secondary | ICD-10-CM | POA: Diagnosis not present

## 2011-04-30 DIAGNOSIS — M5137 Other intervertebral disc degeneration, lumbosacral region: Secondary | ICD-10-CM | POA: Diagnosis not present

## 2011-04-30 DIAGNOSIS — M542 Cervicalgia: Secondary | ICD-10-CM | POA: Diagnosis not present

## 2011-05-05 ENCOUNTER — Other Ambulatory Visit: Payer: Self-pay | Admitting: Internal Medicine

## 2011-05-11 DIAGNOSIS — M48061 Spinal stenosis, lumbar region without neurogenic claudication: Secondary | ICD-10-CM | POA: Diagnosis not present

## 2011-05-11 DIAGNOSIS — IMO0002 Reserved for concepts with insufficient information to code with codable children: Secondary | ICD-10-CM | POA: Diagnosis not present

## 2011-05-11 DIAGNOSIS — M47817 Spondylosis without myelopathy or radiculopathy, lumbosacral region: Secondary | ICD-10-CM | POA: Diagnosis not present

## 2011-05-12 DIAGNOSIS — R350 Frequency of micturition: Secondary | ICD-10-CM | POA: Diagnosis not present

## 2011-05-12 DIAGNOSIS — N3941 Urge incontinence: Secondary | ICD-10-CM | POA: Diagnosis not present

## 2011-05-20 DIAGNOSIS — R269 Unspecified abnormalities of gait and mobility: Secondary | ICD-10-CM | POA: Diagnosis not present

## 2011-05-20 DIAGNOSIS — M25529 Pain in unspecified elbow: Secondary | ICD-10-CM | POA: Diagnosis not present

## 2011-05-20 DIAGNOSIS — M48061 Spinal stenosis, lumbar region without neurogenic claudication: Secondary | ICD-10-CM | POA: Diagnosis not present

## 2011-05-20 DIAGNOSIS — M6281 Muscle weakness (generalized): Secondary | ICD-10-CM | POA: Diagnosis not present

## 2011-05-22 DIAGNOSIS — M25529 Pain in unspecified elbow: Secondary | ICD-10-CM | POA: Diagnosis not present

## 2011-05-22 DIAGNOSIS — R269 Unspecified abnormalities of gait and mobility: Secondary | ICD-10-CM | POA: Diagnosis not present

## 2011-05-22 DIAGNOSIS — M48061 Spinal stenosis, lumbar region without neurogenic claudication: Secondary | ICD-10-CM | POA: Diagnosis not present

## 2011-05-22 DIAGNOSIS — M6281 Muscle weakness (generalized): Secondary | ICD-10-CM | POA: Diagnosis not present

## 2011-05-25 DIAGNOSIS — R269 Unspecified abnormalities of gait and mobility: Secondary | ICD-10-CM | POA: Diagnosis not present

## 2011-05-25 DIAGNOSIS — M25529 Pain in unspecified elbow: Secondary | ICD-10-CM | POA: Diagnosis not present

## 2011-05-25 DIAGNOSIS — M48061 Spinal stenosis, lumbar region without neurogenic claudication: Secondary | ICD-10-CM | POA: Diagnosis not present

## 2011-05-25 DIAGNOSIS — M6281 Muscle weakness (generalized): Secondary | ICD-10-CM | POA: Diagnosis not present

## 2011-05-27 DIAGNOSIS — M48061 Spinal stenosis, lumbar region without neurogenic claudication: Secondary | ICD-10-CM | POA: Diagnosis not present

## 2011-05-27 DIAGNOSIS — M25529 Pain in unspecified elbow: Secondary | ICD-10-CM | POA: Diagnosis not present

## 2011-05-27 DIAGNOSIS — R269 Unspecified abnormalities of gait and mobility: Secondary | ICD-10-CM | POA: Diagnosis not present

## 2011-05-27 DIAGNOSIS — M6281 Muscle weakness (generalized): Secondary | ICD-10-CM | POA: Diagnosis not present

## 2011-06-02 DIAGNOSIS — N3941 Urge incontinence: Secondary | ICD-10-CM | POA: Diagnosis not present

## 2011-06-02 DIAGNOSIS — R350 Frequency of micturition: Secondary | ICD-10-CM | POA: Diagnosis not present

## 2011-06-03 DIAGNOSIS — M48061 Spinal stenosis, lumbar region without neurogenic claudication: Secondary | ICD-10-CM | POA: Diagnosis not present

## 2011-06-03 DIAGNOSIS — M6281 Muscle weakness (generalized): Secondary | ICD-10-CM | POA: Diagnosis not present

## 2011-06-03 DIAGNOSIS — R269 Unspecified abnormalities of gait and mobility: Secondary | ICD-10-CM | POA: Diagnosis not present

## 2011-06-03 DIAGNOSIS — M25529 Pain in unspecified elbow: Secondary | ICD-10-CM | POA: Diagnosis not present

## 2011-06-08 DIAGNOSIS — R269 Unspecified abnormalities of gait and mobility: Secondary | ICD-10-CM | POA: Diagnosis not present

## 2011-06-08 DIAGNOSIS — M48061 Spinal stenosis, lumbar region without neurogenic claudication: Secondary | ICD-10-CM | POA: Diagnosis not present

## 2011-06-08 DIAGNOSIS — M25529 Pain in unspecified elbow: Secondary | ICD-10-CM | POA: Diagnosis not present

## 2011-06-08 DIAGNOSIS — M6281 Muscle weakness (generalized): Secondary | ICD-10-CM | POA: Diagnosis not present

## 2011-06-22 DIAGNOSIS — N3941 Urge incontinence: Secondary | ICD-10-CM | POA: Diagnosis not present

## 2011-06-22 DIAGNOSIS — R350 Frequency of micturition: Secondary | ICD-10-CM | POA: Diagnosis not present

## 2011-07-16 DIAGNOSIS — M47817 Spondylosis without myelopathy or radiculopathy, lumbosacral region: Secondary | ICD-10-CM | POA: Diagnosis not present

## 2011-07-16 DIAGNOSIS — M48061 Spinal stenosis, lumbar region without neurogenic claudication: Secondary | ICD-10-CM | POA: Diagnosis not present

## 2011-07-16 DIAGNOSIS — IMO0002 Reserved for concepts with insufficient information to code with codable children: Secondary | ICD-10-CM | POA: Diagnosis not present

## 2011-07-21 DIAGNOSIS — N3941 Urge incontinence: Secondary | ICD-10-CM | POA: Diagnosis not present

## 2011-07-21 DIAGNOSIS — R350 Frequency of micturition: Secondary | ICD-10-CM | POA: Diagnosis not present

## 2011-08-11 DIAGNOSIS — N390 Urinary tract infection, site not specified: Secondary | ICD-10-CM | POA: Diagnosis not present

## 2011-08-24 ENCOUNTER — Ambulatory Visit (INDEPENDENT_AMBULATORY_CARE_PROVIDER_SITE_OTHER): Payer: Medicare Other | Admitting: Internal Medicine

## 2011-08-24 VITALS — BP 140/72 | HR 80 | Temp 98.2°F | Wt 149.0 lb

## 2011-08-24 DIAGNOSIS — R7309 Other abnormal glucose: Secondary | ICD-10-CM

## 2011-08-24 DIAGNOSIS — R739 Hyperglycemia, unspecified: Secondary | ICD-10-CM

## 2011-08-24 DIAGNOSIS — I10 Essential (primary) hypertension: Secondary | ICD-10-CM | POA: Diagnosis not present

## 2011-08-24 DIAGNOSIS — E785 Hyperlipidemia, unspecified: Secondary | ICD-10-CM

## 2011-08-24 DIAGNOSIS — R269 Unspecified abnormalities of gait and mobility: Secondary | ICD-10-CM | POA: Diagnosis not present

## 2011-08-24 DIAGNOSIS — Z23 Encounter for immunization: Secondary | ICD-10-CM | POA: Diagnosis not present

## 2011-08-24 DIAGNOSIS — D649 Anemia, unspecified: Secondary | ICD-10-CM

## 2011-08-24 LAB — BASIC METABOLIC PANEL
BUN: 14 mg/dL (ref 6–23)
GFR: 71.33 mL/min (ref >60.00)
Glucose, Bld: 68 mg/dL — ABNORMAL LOW (ref 70–99)
Potassium: 3.3 mEq/L — ABNORMAL LOW (ref 3.5–5.1)

## 2011-08-24 NOTE — Assessment & Plan Note (Signed)
No meds Has elevated HDL

## 2011-08-24 NOTE — Assessment & Plan Note (Addendum)
She continues to use walker  She has received ESI for lumbar stenosis/radiculitis--- marked improvement. She is instructed not to take any NSAIDs while on meloxicam or any other prescription pain med

## 2011-08-24 NOTE — Assessment & Plan Note (Signed)
Resolved-  Will remove from problem list 

## 2011-08-24 NOTE — Assessment & Plan Note (Signed)
BP Readings from Last 3 Encounters:  08/24/11 140/72  03/09/11 124/80  12/31/10 138/82  will continue to follow

## 2011-08-25 DIAGNOSIS — N3941 Urge incontinence: Secondary | ICD-10-CM | POA: Diagnosis not present

## 2011-08-25 DIAGNOSIS — R350 Frequency of micturition: Secondary | ICD-10-CM | POA: Diagnosis not present

## 2011-08-26 ENCOUNTER — Other Ambulatory Visit: Payer: Self-pay | Admitting: Internal Medicine

## 2011-08-27 NOTE — Progress Notes (Signed)
Patient ID: Brandy Crawford, female   DOB: May 07, 1923, 78 y.o.   MRN: [id number] htn-- tolerating meds  Lipids- not on amy medicaitons  Past Medical History  Diagnosis Date  . Asthma   . GERD (gastroesophageal reflux disease)   . Hypertension   . Osteoporosis   . TIA (transient ischemic attack)   . Hyperlipemia   . Cervical disc disease   . Anemia   . Depression     History   Social History  . Marital Status: Widowed    Spouse Name: N/A    Number of Children: N/A  . Years of Education: N/A   Occupational History  . Not on file.   Social History Main Topics  . Smoking status: Never Smoker   . Smokeless tobacco: Never Used  . Alcohol Use: No  . Drug Use: No  . Sexually Active: Not on file   Other Topics Concern  . Not on file   Social History Narrative  . No narrative on file    Past Surgical History  Procedure Date  . Appendectomy   . General hysterectomy   . Tonsillectomy   . Cholecystectomy     Family History  Problem Relation Age of Onset  . Pancreatic cancer Father 41    deceased  . Heart failure Mother 104    deceased  . Leukemia Brother 65    deceased  . Diabetes type II    . Alcohol abuse      Allergies  Allergen Reactions  . Amoxicillin     REACTION: unspecified  . Penicillins     REACTION: rash  . Sulfamethoxazole     REACTION: unspecified    Current Outpatient Prescriptions on File Prior to Visit  Medication Sig Dispense Refill  . albuterol (PROAIR HFA) 108 (90 BASE) MCG/ACT inhaler Inhale 2 puffs into the lungs every 6 (six) hours as needed.  1 Inhaler  1  . Ascorbic Acid (VITAMIN C) 500 MG tablet Take 500 mg by mouth daily.        Marland Kitchen aspirin 81 MG chewable tablet Chew 81 mg by mouth daily.        . Calcium Carb-Vit D-Soy Isoflav (CALTRATE 600 + SOY) 600-200-25 MG-UNIT-MG TABS Take by mouth daily.        Marland Kitchen COZAAR 100 MG tablet TAKE 1 TABLET ONCE DAILY.  90 each  1  . Mirabegron ER (MYRBETRIQ) 25 MG TB24 Take 25 tablets by mouth  daily. Prescribed by urology       . NORVASC 5 MG tablet TAKE 1 TABLET ONCE DAILY.  90 each  0     patient denies chest pain, shortness of breath, orthopnea. Denies lower extremity edema, abdominal pain, change in appetite, change in bowel movements. Patient denies rashes, musculoskeletal complaints. No other specific complaints in a complete review of systems.   BP 140/72  Pulse 80  Temp(Src) 98.2 F (36.8 C) (Oral)  Wt 149 lb (67.586 kg) elderlyfemale in no acute distress. HEENT exam atraumatic, normocephalic, extraocular muscles are intact. Neck is supple. No jugular venous distention no thyromegaly. Chest clear to auscultation without increased work of breathing. Cardiac exam S1 and S2 are regular. Abdominal exam active bowel sounds, soft, nontender. Extremities no edema.

## 2011-09-08 ENCOUNTER — Other Ambulatory Visit (INDEPENDENT_AMBULATORY_CARE_PROVIDER_SITE_OTHER): Payer: Medicare Other

## 2011-09-08 DIAGNOSIS — I10 Essential (primary) hypertension: Secondary | ICD-10-CM | POA: Diagnosis not present

## 2011-09-08 LAB — BASIC METABOLIC PANEL
BUN: 16 mg/dL (ref 6–23)
CO2: 23 mEq/L (ref 19–32)
Chloride: 106 mEq/L (ref 96–112)
Creatinine, Ser: 0.8 mg/dL (ref 0.4–1.2)
Glucose, Bld: 75 mg/dL (ref 70–99)

## 2011-09-09 DIAGNOSIS — M47817 Spondylosis without myelopathy or radiculopathy, lumbosacral region: Secondary | ICD-10-CM | POA: Diagnosis not present

## 2011-09-09 DIAGNOSIS — IMO0002 Reserved for concepts with insufficient information to code with codable children: Secondary | ICD-10-CM | POA: Diagnosis not present

## 2011-09-09 DIAGNOSIS — M48061 Spinal stenosis, lumbar region without neurogenic claudication: Secondary | ICD-10-CM | POA: Diagnosis not present

## 2011-09-15 DIAGNOSIS — R350 Frequency of micturition: Secondary | ICD-10-CM | POA: Diagnosis not present

## 2011-09-15 DIAGNOSIS — N3941 Urge incontinence: Secondary | ICD-10-CM | POA: Diagnosis not present

## 2011-10-06 DIAGNOSIS — N3941 Urge incontinence: Secondary | ICD-10-CM | POA: Diagnosis not present

## 2011-10-06 DIAGNOSIS — R350 Frequency of micturition: Secondary | ICD-10-CM | POA: Diagnosis not present

## 2011-10-21 DIAGNOSIS — IMO0002 Reserved for concepts with insufficient information to code with codable children: Secondary | ICD-10-CM | POA: Diagnosis not present

## 2011-10-21 DIAGNOSIS — M48061 Spinal stenosis, lumbar region without neurogenic claudication: Secondary | ICD-10-CM | POA: Diagnosis not present

## 2011-10-21 DIAGNOSIS — M47817 Spondylosis without myelopathy or radiculopathy, lumbosacral region: Secondary | ICD-10-CM | POA: Diagnosis not present

## 2011-10-26 ENCOUNTER — Other Ambulatory Visit: Payer: Self-pay | Admitting: *Deleted

## 2011-10-26 DIAGNOSIS — I10 Essential (primary) hypertension: Secondary | ICD-10-CM

## 2011-10-26 MED ORDER — LOSARTAN POTASSIUM 100 MG PO TABS: 100.0000 mg | ORAL_TABLET | Freq: Every day | ORAL | Status: DC

## 2011-10-27 DIAGNOSIS — N3941 Urge incontinence: Secondary | ICD-10-CM | POA: Diagnosis not present

## 2011-11-17 DIAGNOSIS — R350 Frequency of micturition: Secondary | ICD-10-CM | POA: Diagnosis not present

## 2011-11-17 DIAGNOSIS — N3941 Urge incontinence: Secondary | ICD-10-CM | POA: Diagnosis not present

## 2011-11-26 ENCOUNTER — Other Ambulatory Visit: Payer: Self-pay | Admitting: Internal Medicine

## 2011-12-08 DIAGNOSIS — R350 Frequency of micturition: Secondary | ICD-10-CM | POA: Diagnosis not present

## 2011-12-08 DIAGNOSIS — N3941 Urge incontinence: Secondary | ICD-10-CM | POA: Diagnosis not present

## 2011-12-29 DIAGNOSIS — R350 Frequency of micturition: Secondary | ICD-10-CM | POA: Diagnosis not present

## 2011-12-29 DIAGNOSIS — N3941 Urge incontinence: Secondary | ICD-10-CM | POA: Diagnosis not present

## 2012-01-11 ENCOUNTER — Ambulatory Visit (INDEPENDENT_AMBULATORY_CARE_PROVIDER_SITE_OTHER): Payer: Medicare Other | Admitting: Internal Medicine

## 2012-01-11 ENCOUNTER — Encounter: Payer: Self-pay | Admitting: Internal Medicine

## 2012-01-11 VITALS — BP 130/70 | Temp 98.5°F | Wt 150.0 lb

## 2012-01-11 DIAGNOSIS — I10 Essential (primary) hypertension: Secondary | ICD-10-CM | POA: Diagnosis not present

## 2012-01-11 DIAGNOSIS — E785 Hyperlipidemia, unspecified: Secondary | ICD-10-CM | POA: Diagnosis not present

## 2012-01-11 DIAGNOSIS — D649 Anemia, unspecified: Secondary | ICD-10-CM

## 2012-01-11 DIAGNOSIS — Z23 Encounter for immunization: Secondary | ICD-10-CM

## 2012-01-11 DIAGNOSIS — K219 Gastro-esophageal reflux disease without esophagitis: Secondary | ICD-10-CM

## 2012-01-11 DIAGNOSIS — IMO0002 Reserved for concepts with insufficient information to code with codable children: Secondary | ICD-10-CM

## 2012-01-11 LAB — CBC WITH DIFFERENTIAL/PLATELET
Basophils Absolute: 0 10*3/uL (ref 0.0–0.1)
Eosinophils Absolute: 0.2 10*3/uL (ref 0.0–0.7)
MCHC: 33.4 g/dL (ref 30.0–36.0)
MCV: 95.4 fl (ref 78.0–100.0)
Monocytes Absolute: 0.9 10*3/uL (ref 0.1–1.0)
Neutrophils Relative %: 76.1 % (ref 43.0–77.0)
Platelets: 346 10*3/uL (ref 150.0–400.0)
RDW: 12.8 % (ref 11.5–14.6)
WBC: 7.8 10*3/uL (ref 4.5–10.5)

## 2012-01-11 LAB — BASIC METABOLIC PANEL
Calcium: 10.3 mg/dL (ref 8.4–10.5)
GFR: 76.7 mL/min (ref >60.00)
Potassium: 4.5 mEq/L (ref 3.5–5.1)
Sodium: 138 mEq/L (ref 135–145)

## 2012-01-11 NOTE — Progress Notes (Signed)
Subjective:    Patient ID: Brandy Crawford, female    DOB: 1924/02/18, 78 y.o.   MRN: [id number]  HPI  78 year old patient who is seen today for followup. She has a chief complaint of diarrhea that has been present for the past 5 days. She has been using Imodium and this has improved nicely. She also describes a fairly mild crampy abdominal pain. No nausea or vomiting. She has treated hypertension which has been stable. She has osteoarthritis and has been on Mobic. Her orthopedic physician has suggested some followup lab. She is requesting a flu vaccine today. She complains of some mild sciatica.  Past Medical History  Diagnosis Date  . Asthma   . GERD (gastroesophageal reflux disease)   . Hypertension   . Osteoporosis   . TIA (transient ischemic attack)   . Hyperlipemia   . Cervical disc disease   . Anemia   . Depression     History   Social History  . Marital Status: Widowed    Spouse Name: N/A    Number of Children: N/A  . Years of Education: N/A   Occupational History  . Not on file.   Social History Main Topics  . Smoking status: Never Smoker   . Smokeless tobacco: Never Used  . Alcohol Use: No  . Drug Use: No  . Sexually Active: Not on file   Other Topics Concern  . Not on file   Social History Narrative  . No narrative on file    Past Surgical History  Procedure Date  . Appendectomy   . General hysterectomy   . Tonsillectomy   . Cholecystectomy     Family History  Problem Relation Age of Onset  . Pancreatic cancer Father 58    deceased  . Heart failure Mother 104    deceased  . Leukemia Brother 65    deceased  . Diabetes type II    . Alcohol abuse      Allergies  Allergen Reactions  . Amoxicillin     REACTION: unspecified  . Penicillins     REACTION: rash  . Sulfamethoxazole     REACTION: unspecified    Current Outpatient Prescriptions on File Prior to Visit  Medication Sig Dispense Refill  . albuterol (PROAIR HFA) 108 (90  BASE) MCG/ACT inhaler Inhale 2 puffs into the lungs every 6 (six) hours as needed.  1 Inhaler  1  . Ascorbic Acid (VITAMIN C) 500 MG tablet Take 500 mg by mouth daily.        Marland Kitchen aspirin 81 MG chewable tablet Chew 81 mg by mouth daily.        Marland Kitchen losartan (COZAAR) 100 MG tablet Take 1 tablet (100 mg total) by mouth daily.  90 tablet  1  . meloxicam (MOBIC) 15 MG tablet Take 1 tablet by mouth daily.      . Mirabegron ER (MYRBETRIQ) 25 MG TB24 Take 25 tablets by mouth daily. Prescribed by urology       . NORVASC 5 MG tablet TAKE 1 TABLET ONCE DAILY.  90 each  1    BP 130/70  Temp 98.5 F (36.9 C) (Oral)  Wt 150 lb (68.04 kg)     Review of Systems  Constitutional: Negative.   HENT: Negative for hearing loss, congestion, sore throat, rhinorrhea, dental problem, sinus pressure and tinnitus.   Eyes: Negative for pain, discharge and visual disturbance.  Respiratory: Negative for cough and shortness of breath.   Cardiovascular: Negative for chest  pain, palpitations and leg swelling.  Gastrointestinal: Positive for abdominal pain and diarrhea. Negative for nausea, vomiting, constipation, blood in stool and abdominal distention.  Genitourinary: Negative for dysuria, urgency, frequency, hematuria, flank pain, vaginal bleeding, vaginal discharge, difficulty urinating, vaginal pain and pelvic pain.  Musculoskeletal: Positive for back pain, arthralgias and gait problem. Negative for joint swelling.  Skin: Negative for rash.  Neurological: Negative for dizziness, syncope, speech difficulty, weakness, numbness and headaches.  Hematological: Negative for adenopathy.  Psychiatric/Behavioral: Negative for behavioral problems, dysphoric mood and agitation. The patient is not nervous/anxious.        Objective:   Physical Exam  Constitutional: She is oriented to person, place, and time. She appears well-developed and well-nourished.  HENT:  Head: Normocephalic.  Right Ear: External ear normal.  Left  Ear: External ear normal.  Mouth/Throat: Oropharynx is clear and moist.  Eyes: Conjunctivae normal and EOM are normal. Pupils are equal, round, and reactive to light.  Neck: Normal range of motion. Neck supple. No thyromegaly present.  Cardiovascular: Normal rate, regular rhythm, normal heart sounds and intact distal pulses.   Pulmonary/Chest: Effort normal and breath sounds normal.  Abdominal: Soft. Bowel sounds are normal. She exhibits no distension and no mass. There is no tenderness. There is no rebound and no guarding.  Musculoskeletal: Normal range of motion.  Lymphadenopathy:    She has no cervical adenopathy.  Neurological: She is alert and oriented to person, place, and time.  Skin: Skin is warm and dry. No rash noted.  Psychiatric: She has a normal mood and affect. Her behavior is normal.          Assessment & Plan:    Diarrhea. This seems to be improving we'll give samples of ALIGN to take for 7 days  Hypertension well controlled  Osteoarthritis. We'll check a CBC and Bmet Dyslipidemia   Flu vaccine administered   Recheck 6 months

## 2012-01-11 NOTE — Patient Instructions (Signed)
ALIGN one daily  Call or return to clinic prn if these symptoms worsen or fail to improve as anticipated.  Limit your sodium (Salt) intake  Take a calcium supplement, plus [phone] units of vitamin D  Return in 6 months for follow-up

## 2012-01-19 DIAGNOSIS — N3941 Urge incontinence: Secondary | ICD-10-CM | POA: Diagnosis not present

## 2012-01-19 DIAGNOSIS — R350 Frequency of micturition: Secondary | ICD-10-CM | POA: Diagnosis not present

## 2012-02-16 DIAGNOSIS — N3941 Urge incontinence: Secondary | ICD-10-CM | POA: Diagnosis not present

## 2012-02-24 ENCOUNTER — Ambulatory Visit: Payer: Federal, State, Local not specified - PPO | Admitting: Internal Medicine

## 2012-03-18 ENCOUNTER — Ambulatory Visit (INDEPENDENT_AMBULATORY_CARE_PROVIDER_SITE_OTHER): Payer: Medicare Other | Admitting: Internal Medicine

## 2012-03-18 ENCOUNTER — Encounter: Payer: Self-pay | Admitting: Internal Medicine

## 2012-03-18 VITALS — BP 134/76 | HR 76 | Temp 98.2°F | Wt 141.0 lb

## 2012-03-18 DIAGNOSIS — R109 Unspecified abdominal pain: Secondary | ICD-10-CM | POA: Diagnosis not present

## 2012-03-18 LAB — CBC WITH DIFFERENTIAL/PLATELET
Basophils Relative: 0.1 % (ref 0.0–3.0)
Eosinophils Absolute: 0.2 10*3/uL (ref 0.0–0.7)
Hemoglobin: 13.5 g/dL (ref 12.0–15.0)
Lymphs Abs: 0.9 10*3/uL (ref 0.7–4.0)
MCHC: 32.8 g/dL (ref 30.0–36.0)
MCV: 94 fl (ref 78.0–100.0)
Monocytes Absolute: 1 10*3/uL (ref 0.1–1.0)
Neutro Abs: 4.8 10*3/uL (ref 1.4–7.7)
RBC: 4.39 Mil/uL (ref 3.87–5.11)

## 2012-03-18 LAB — BASIC METABOLIC PANEL
CO2: 28 mEq/L (ref 19–32)
Chloride: 105 mEq/L (ref 96–112)
Sodium: 140 mEq/L (ref 135–145)

## 2012-03-18 LAB — H. PYLORI ANTIBODY, IGG: H Pylori IgG: NEGATIVE

## 2012-03-18 LAB — HEPATIC FUNCTION PANEL
ALT: 24 U/L (ref 0–35)
Total Protein: 7 g/dL (ref 6.0–8.3)

## 2012-03-18 LAB — LIPASE: Lipase: 18 U/L (ref 11.0–59.0)

## 2012-03-18 MED ORDER — OMEPRAZOLE 20 MG PO CPDR: 20.0000 mg | DELAYED_RELEASE_CAPSULE | Freq: Every day | ORAL | Status: DC

## 2012-03-18 NOTE — Progress Notes (Signed)
Patient ID: Brandy Crawford, female   DOB: 05/15/1923, 78 y.o.   MRN: [id number] abd pain- "all over" 2 months All of the time Relieved with food Lost 9 pounds past 3 months  Good appetite, no N/V   patient denies chest pain, shortness of breath, orthopnea. Denies lower extremity edema, abdominal pain, change in appetite, change in bowel movements. Patient denies rashes, musculoskeletal complaints. No other specific complaints in a complete review of systems.    Well-developed well-nourished female in no acute distress. HEENT exam atraumatic, normocephalic, extraocular muscles are intact. Neck is supple. No jugular venous distention no thyromegaly. Chest clear to auscultation without increased work of breathing. Cardiac exam S1 and S2 are regular. Abdominal exam active bowel sounds, soft, diffusely tender to deep palpation. Extremities no edema. Neurologic exam she is alert without any motor sensory deficits. Gait is normal.  A/p- abd pain Check labs Omeprazole  She will call me next week to let me know if sxs persist

## 2012-03-22 DIAGNOSIS — N3941 Urge incontinence: Secondary | ICD-10-CM | POA: Diagnosis not present

## 2012-04-12 DIAGNOSIS — R350 Frequency of micturition: Secondary | ICD-10-CM | POA: Diagnosis not present

## 2012-04-12 DIAGNOSIS — N3941 Urge incontinence: Secondary | ICD-10-CM | POA: Diagnosis not present

## 2012-04-20 DIAGNOSIS — IMO0002 Reserved for concepts with insufficient information to code with codable children: Secondary | ICD-10-CM | POA: Diagnosis not present

## 2012-04-20 DIAGNOSIS — M48061 Spinal stenosis, lumbar region without neurogenic claudication: Secondary | ICD-10-CM | POA: Diagnosis not present

## 2012-04-20 DIAGNOSIS — M47817 Spondylosis without myelopathy or radiculopathy, lumbosacral region: Secondary | ICD-10-CM | POA: Diagnosis not present

## 2012-05-02 ENCOUNTER — Other Ambulatory Visit: Payer: Self-pay | Admitting: Internal Medicine

## 2012-05-03 DIAGNOSIS — R350 Frequency of micturition: Secondary | ICD-10-CM | POA: Diagnosis not present

## 2012-05-03 DIAGNOSIS — N3941 Urge incontinence: Secondary | ICD-10-CM | POA: Diagnosis not present

## 2012-05-21 ENCOUNTER — Other Ambulatory Visit: Payer: Self-pay | Admitting: Internal Medicine

## 2012-05-31 DIAGNOSIS — N3941 Urge incontinence: Secondary | ICD-10-CM | POA: Diagnosis not present

## 2012-05-31 DIAGNOSIS — R350 Frequency of micturition: Secondary | ICD-10-CM | POA: Diagnosis not present

## 2012-07-05 DIAGNOSIS — N3941 Urge incontinence: Secondary | ICD-10-CM | POA: Diagnosis not present

## 2012-07-06 DIAGNOSIS — M47817 Spondylosis without myelopathy or radiculopathy, lumbosacral region: Secondary | ICD-10-CM | POA: Diagnosis not present

## 2012-07-06 DIAGNOSIS — IMO0002 Reserved for concepts with insufficient information to code with codable children: Secondary | ICD-10-CM | POA: Diagnosis not present

## 2012-07-06 DIAGNOSIS — M48061 Spinal stenosis, lumbar region without neurogenic claudication: Secondary | ICD-10-CM | POA: Diagnosis not present

## 2012-08-09 DIAGNOSIS — N3941 Urge incontinence: Secondary | ICD-10-CM | POA: Diagnosis not present

## 2012-08-09 DIAGNOSIS — R350 Frequency of micturition: Secondary | ICD-10-CM | POA: Diagnosis not present

## 2012-09-06 DIAGNOSIS — N3941 Urge incontinence: Secondary | ICD-10-CM | POA: Diagnosis not present

## 2012-09-06 DIAGNOSIS — R350 Frequency of micturition: Secondary | ICD-10-CM | POA: Diagnosis not present

## 2012-09-16 ENCOUNTER — Ambulatory Visit: Payer: Federal, State, Local not specified - PPO | Admitting: Internal Medicine

## 2012-09-20 ENCOUNTER — Ambulatory Visit (INDEPENDENT_AMBULATORY_CARE_PROVIDER_SITE_OTHER): Payer: Medicare Other | Admitting: Family Medicine

## 2012-09-20 VITALS — BP 162/82 | Temp 98.4°F | Wt 150.0 lb

## 2012-09-20 DIAGNOSIS — K5289 Other specified noninfective gastroenteritis and colitis: Secondary | ICD-10-CM

## 2012-09-20 DIAGNOSIS — K529 Noninfective gastroenteritis and colitis, unspecified: Secondary | ICD-10-CM

## 2012-09-20 DIAGNOSIS — R030 Elevated blood-pressure reading, without diagnosis of hypertension: Secondary | ICD-10-CM

## 2012-09-20 NOTE — Patient Instructions (Signed)
-  no dairy for 1 week  -plenty of fluids, soup broth and juice are good  -imodium or loperamide as needed per instructions  -follow up as needed

## 2012-09-20 NOTE — Progress Notes (Signed)
Chief Complaint  Patient presents with  . Diarrhea    had last Monday and came back today     HPI:  Acute visit for diarrhea: -started about 1 week ago -cleared up for a few days, then had more diarrhea today with several episodes loose, not watery stools -denies: fevers, vomiting, abd pain or blood in stools or melena, recent travel or recent antibiotics -diarrheal illness at place where she is staying at [facility] home  ROS: See pertinent positives and negatives per HPI.  Past Medical History  Diagnosis Date  . Asthma   . GERD (gastroesophageal reflux disease)   . Hypertension   . Osteoporosis   . TIA (transient ischemic attack)   . Hyperlipemia   . Cervical disc disease   . Anemia   . Depression     Family History  Problem Relation Age of Onset  . Pancreatic cancer Father 24    deceased  . Heart failure Mother 104    deceased  . Leukemia Brother 65    deceased  . Diabetes type II    . Alcohol abuse      History   Social History  . Marital Status: Widowed    Spouse Name: N/A    Number of Children: N/A  . Years of Education: N/A   Social History Main Topics  . Smoking status: Never Smoker   . Smokeless tobacco: Never Used  . Alcohol Use: No  . Drug Use: No  . Sexually Active: Not on file   Other Topics Concern  . Not on file   Social History Narrative  . No narrative on file    Current outpatient prescriptions:albuterol (PROAIR HFA) 108 (90 BASE) MCG/ACT inhaler, Inhale 2 puffs into the lungs every 6 (six) hours as needed., Disp: 1 Inhaler, Rfl: 1;  amLODipine (NORVASC) 5 MG tablet, TAKE 1 TABLET ONCE DAILY., Disp: 90 tablet, Rfl: 3;  Ascorbic Acid (VITAMIN C) 500 MG tablet, Take 500 mg by mouth daily.  , Disp: , Rfl: ;  aspirin 81 MG chewable tablet, Chew 81 mg by mouth daily.  , Disp: , Rfl:  losartan (COZAAR) 100 MG tablet, TAKE 1 TABLET ONCE DAILY., Disp: 90 tablet, Rfl: 1;  meloxicam (MOBIC) 15 MG tablet, Take 1 tablet by mouth daily., Disp: , Rfl:  ;  Mirabegron ER (MYRBETRIQ) 25 MG TB24, Take 25 tablets by mouth daily. Prescribed by urology , Disp: , Rfl: ;  omeprazole (PRILOSEC) 20 MG capsule, Take 1 capsule (20 mg total) by mouth daily., Disp: 30 capsule, Rfl: 3 Pyridoxine HCl (VITAMIN B-6) 250 MG tablet, Take 250 mg by mouth daily., Disp: , Rfl: ;  vitamin E (VITAMIN E) 400 UNIT capsule, Take 400 Units by mouth daily., Disp: , Rfl:   EXAM:  Filed Vitals:   09/20/12 1334  BP: 162/82  Temp: 98.4 F (36.9 C)    Body mass index is 24.96 kg/(m^2).  GENERAL: vitals reviewed and listed above, alert, oriented, appears well hydrated and in no acute distress  HEENT: atraumatic, conjunttiva clear, no obvious abnormalities on inspection of external nose and ears  NECK: no obvious masses on inspection  LUNGS: clear to auscultation bilaterally, no wheezes, rales or rhonchi, good air movement  CV: HRRR, no peripheral edema  ABD: BS+, soft, NTTP  MS: moves all extremities without noticeable abnormality  PSYCH: pleasant and cooperative, no obvious depression or anxiety  ASSESSMENT AND PLAN:  Discussed the following assessment and plan:  Gastroenteritis  Elevated blood pressure  -  likely viral, benign exam. Advised oral rehydration, supportive care and return precautions. -discussed elevated BP, she has follow up with PCP in 2 weeks and prefers to recheck then - reports she was stressed getting here as was running late. -Patient advised to return or notify a doctor immediately if symptoms worsen or persist or new concerns arise.  Patient Instructions  -no dairy for 1 week  -plenty of fluids, soup broth and juice are good  -imodium or loperamide as needed per instructions  -follow up as needed     Nial Hawe, Dahlia Client R.

## 2012-09-27 DIAGNOSIS — R350 Frequency of micturition: Secondary | ICD-10-CM | POA: Diagnosis not present

## 2012-09-27 DIAGNOSIS — N3941 Urge incontinence: Secondary | ICD-10-CM | POA: Diagnosis not present

## 2012-09-28 ENCOUNTER — Ambulatory Visit (INDEPENDENT_AMBULATORY_CARE_PROVIDER_SITE_OTHER)
Admission: RE | Admit: 2012-09-28 | Discharge: 2012-09-28 | Disposition: A | Discharge status: Home / Self Care | Payer: Medicare Other | Source: Ambulatory Visit | Attending: Internal Medicine | Admitting: Internal Medicine

## 2012-09-28 ENCOUNTER — Encounter: Payer: Self-pay | Admitting: Internal Medicine

## 2012-09-28 ENCOUNTER — Ambulatory Visit (INDEPENDENT_AMBULATORY_CARE_PROVIDER_SITE_OTHER): Payer: Medicare Other | Admitting: Internal Medicine

## 2012-09-28 VITALS — BP 122/70 | HR 88 | Temp 98.6°F | Wt 153.0 lb

## 2012-09-28 DIAGNOSIS — R0600 Dyspnea, unspecified: Secondary | ICD-10-CM

## 2012-09-28 DIAGNOSIS — R0989 Other specified symptoms and signs involving the circulatory and respiratory systems: Secondary | ICD-10-CM

## 2012-09-28 DIAGNOSIS — R0609 Other forms of dyspnea: Secondary | ICD-10-CM

## 2012-09-28 DIAGNOSIS — R0602 Shortness of breath: Secondary | ICD-10-CM | POA: Diagnosis not present

## 2012-09-28 LAB — CBC WITH DIFFERENTIAL/PLATELET
Basophils Absolute: 0 10*3/uL (ref 0.0–0.1)
Basophils Relative: 0.1 % (ref 0.0–3.0)
Eosinophils Absolute: 0.2 10*3/uL (ref 0.0–0.7)
HCT: 41.7 % (ref 36.0–46.0)
Hemoglobin: 14 g/dL (ref 12.0–15.0)
Lymphocytes Relative: 11.1 % — ABNORMAL LOW (ref 12.0–46.0)
Lymphs Abs: 0.8 10*3/uL (ref 0.7–4.0)
MCHC: 33.6 g/dL (ref 30.0–36.0)
Neutro Abs: 5.3 10*3/uL (ref 1.4–7.7)
RBC: 4.38 Mil/uL (ref 3.87–5.11)
RDW: 13.6 % (ref 11.5–14.6)

## 2012-09-28 LAB — BASIC METABOLIC PANEL
BUN: 13 mg/dL (ref 6–23)
CO2: 24 mEq/L (ref 19–32)
Calcium: 9.3 mg/dL (ref 8.4–10.5)
Chloride: 100 mEq/L (ref 96–112)
Creatinine, Ser: 0.7 mg/dL (ref 0.4–1.2)

## 2012-09-28 LAB — HEPATIC FUNCTION PANEL
AST: 23 U/L (ref 0–37)
Albumin: 4.1 g/dL (ref 3.5–5.2)
Alkaline Phosphatase: 54 U/L (ref 39–117)
Bilirubin, Direct: 0.1 mg/dL (ref 0.0–0.3)
Total Bilirubin: 0.7 mg/dL (ref 0.3–1.2)

## 2012-09-28 NOTE — Progress Notes (Signed)
Patient ID: Brandy Crawford, female   DOB: 1923/04/30, 79 y.o.   MRN: [id number]   Comes in for f/u but has complaints of SOB. Ongoing for 1 month- No associated chest pain States that she pants for air if she walks "too far" (< 1 block) Has noted some leg swelling  Reviewed pmh, psh, sochx, meds  Past Medical History  Diagnosis Date  . Asthma   . GERD (gastroesophageal reflux disease)   . Hypertension   . Osteoporosis   . TIA (transient ischemic attack)   . Hyperlipemia   . Cervical disc disease   . Anemia   . Depression     History   Social History  . Marital Status: Widowed    Spouse Name: N/A    Number of Children: N/A  . Years of Education: N/A   Occupational History  . Not on file.   Social History Main Topics  . Smoking status: Never Smoker   . Smokeless tobacco: Never Used  . Alcohol Use: No  . Drug Use: No  . Sexually Active: Not on file   Other Topics Concern  . Not on file   Social History Narrative  . No narrative on file    Past Surgical History  Procedure Laterality Date  . Appendectomy    . General hysterectomy    . Tonsillectomy    . Cholecystectomy      Family History  Problem Relation Age of Onset  . Pancreatic cancer Father 63    deceased  . Heart failure Mother 104    deceased  . Leukemia Brother 65    deceased  . Diabetes type II    . Alcohol abuse      Allergies  Allergen Reactions  . Amoxicillin     REACTION: unspecified  . Penicillins     REACTION: rash  . Sulfamethoxazole     REACTION: unspecified    Current Outpatient Prescriptions on File Prior to Visit  Medication Sig Dispense Refill  . amLODipine (NORVASC) 5 MG tablet TAKE 1 TABLET ONCE DAILY.  90 tablet  3  . Ascorbic Acid (VITAMIN C) 500 MG tablet Take 500 mg by mouth daily.        Marland Kitchen aspirin 81 MG chewable tablet Chew 81 mg by mouth daily.        Marland Kitchen losartan (COZAAR) 100 MG tablet TAKE 1 TABLET ONCE DAILY.  90 tablet  1  . meloxicam (MOBIC) 15 MG  tablet Take 1 tablet by mouth daily.      . Mirabegron ER (MYRBETRIQ) 25 MG TB24 Take 25 tablets by mouth daily. Prescribed by urology       . Pyridoxine HCl (VITAMIN B-6) 250 MG tablet Take 250 mg by mouth daily.      . vitamin E (VITAMIN E) 400 UNIT capsule Take 400 Units by mouth daily.       No current facility-administered medications on file prior to visit.     patient denies chest pain, shortness of breath, orthopnea. Denies lower extremity edema, abdominal pain, change in appetite, change in bowel movements. Patient denies rashes, musculoskeletal complaints. No other specific complaints in a complete review of systems.   BP 122/70  Pulse 88  Temp(Src) 98.6 F (37 C) (Oral)  Wt 153 lb (69.4 kg)  BMI 25.46 kg/m2 Elderly,  nad Chest- cta cv- reg rate abd- soft, NOT, ND Ext- 1+ edema  A/p- dyspnea- check labs Check ekg Check cxr  Final plan after testing performed

## 2012-10-03 ENCOUNTER — Other Ambulatory Visit: Payer: Self-pay | Admitting: Internal Medicine

## 2012-10-03 DIAGNOSIS — R0600 Dyspnea, unspecified: Secondary | ICD-10-CM

## 2012-10-10 ENCOUNTER — Ambulatory Visit (HOSPITAL_COMMUNITY): Payer: Medicare Other | Attending: Internal Medicine | Admitting: Radiology

## 2012-10-10 DIAGNOSIS — J45909 Unspecified asthma, uncomplicated: Secondary | ICD-10-CM | POA: Insufficient documentation

## 2012-10-10 DIAGNOSIS — E785 Hyperlipidemia, unspecified: Secondary | ICD-10-CM | POA: Diagnosis not present

## 2012-10-10 DIAGNOSIS — I10 Essential (primary) hypertension: Secondary | ICD-10-CM | POA: Diagnosis not present

## 2012-10-10 DIAGNOSIS — Z8673 Personal history of transient ischemic attack (TIA), and cerebral infarction without residual deficits: Secondary | ICD-10-CM | POA: Diagnosis not present

## 2012-10-10 DIAGNOSIS — R0600 Dyspnea, unspecified: Secondary | ICD-10-CM

## 2012-10-10 DIAGNOSIS — R0602 Shortness of breath: Secondary | ICD-10-CM | POA: Diagnosis not present

## 2012-10-10 DIAGNOSIS — R0989 Other specified symptoms and signs involving the circulatory and respiratory systems: Secondary | ICD-10-CM | POA: Insufficient documentation

## 2012-10-10 DIAGNOSIS — R0609 Other forms of dyspnea: Secondary | ICD-10-CM | POA: Diagnosis not present

## 2012-10-10 NOTE — Progress Notes (Signed)
Echocardiogram performed.  

## 2012-10-26 ENCOUNTER — Other Ambulatory Visit (INDEPENDENT_AMBULATORY_CARE_PROVIDER_SITE_OTHER): Payer: Medicare Other

## 2012-10-26 DIAGNOSIS — I10 Essential (primary) hypertension: Secondary | ICD-10-CM | POA: Diagnosis not present

## 2012-10-26 LAB — BASIC METABOLIC PANEL
BUN: 13 mg/dL (ref 6–23)
CO2: 28 mEq/L (ref 19–32)
Calcium: 10 mg/dL (ref 8.4–10.5)
Chloride: 106 mEq/L (ref 96–112)
Creatinine, Ser: 0.9 mg/dL (ref 0.4–1.2)
GFR: 65.5 mL/min (ref >60.00)
Glucose, Bld: 149 mg/dL — ABNORMAL HIGH (ref 70–99)
Potassium: 4.4 mEq/L (ref 3.5–5.1)
Sodium: 140 mEq/L (ref 135–145)

## 2012-10-27 ENCOUNTER — Ambulatory Visit (INDEPENDENT_AMBULATORY_CARE_PROVIDER_SITE_OTHER): Payer: Medicare Other | Admitting: Internal Medicine

## 2012-10-27 ENCOUNTER — Encounter: Payer: Self-pay | Admitting: Internal Medicine

## 2012-10-27 VITALS — BP 156/72 | HR 85 | Temp 98.2°F | Wt 151.0 lb

## 2012-10-27 DIAGNOSIS — J208 Acute bronchitis due to other specified organisms: Secondary | ICD-10-CM | POA: Insufficient documentation

## 2012-10-27 DIAGNOSIS — B9689 Other specified bacterial agents as the cause of diseases classified elsewhere: Secondary | ICD-10-CM

## 2012-10-27 DIAGNOSIS — J45909 Unspecified asthma, uncomplicated: Secondary | ICD-10-CM | POA: Diagnosis not present

## 2012-10-27 DIAGNOSIS — K219 Gastro-esophageal reflux disease without esophagitis: Secondary | ICD-10-CM

## 2012-10-27 DIAGNOSIS — A499 Bacterial infection, unspecified: Secondary | ICD-10-CM

## 2012-10-27 DIAGNOSIS — J209 Acute bronchitis, unspecified: Secondary | ICD-10-CM | POA: Diagnosis not present

## 2012-10-27 MED ORDER — AZITHROMYCIN 250 MG PO TABS: 250.0000 mg | ORAL_TABLET | ORAL | Status: DC

## 2012-10-27 MED ORDER — HYDROCODONE-HOMATROPINE 5-1.5 MG/5ML PO SYRP
5.0000 mL | ORAL_SOLUTION | ORAL | Status: DC | PRN
Start: 2012-10-27 — End: 2013-11-13

## 2012-10-27 NOTE — Patient Instructions (Signed)
This could be a bacterial bronchitis  Because of the blood .  In the phlegm.   Begin antibiotic  If needed for comfort can add   Cough medication at night . Can make you drowsy   If not  imrpoving in the next week  Contact us for  Further advice .   Your oxygen level is good today.

## 2012-10-27 NOTE — Progress Notes (Signed)
Chief Complaint  Patient presents with  . Cough    Cough is productive of a yellow/green/pink sputum.  Cough is ongoing for 2 weeks.    HPI: Patient comes in today for SDA for  new problem evaluation. Onset about 2 weeks ago  Of ocughing up mucous and some ur sx  Today  A bit better   Today  Coughing up phlegm with blood streaks  and worse at night  . Tried prilosec early     ricola cough drops.  [facility] in [facility] . No fall wheezing  Swelling     Seen about a month ago by PCP for dypnea    This is different  Never used albuterol  Had c xray ekg and lab and echo ordered ." unclear why short of breath" Notes Recorded by Lavada Mesi, CMA on 10/17/2012 at 11:28 AM Pt goes to tai chi on M,W,F and walks everyday. She use to have an albuterol inhaler. Do you think that would help her and if so could she get a refill on that? ------  Notes Recorded by Lavada Mesi, CMA on 10/17/2012 at 11:22 AM Left message for pt to call back ------  Notes Recorded by Lindley Magnus, MD on 10/13/2012 at 4:31 PM Echocardiogram looks okay. I suspect her shortness of breath is due to deconditioning. Recommend gradually increasing exercise program  ROS: See pertinent positives and negatives per HPI. Has hearing aids   Past Medical History  Diagnosis Date  . Asthma   . GERD (gastroesophageal reflux disease)   . Hypertension   . Osteoporosis   . TIA (transient ischemic attack)   . Hyperlipemia   . Cervical disc disease   . Anemia   . Depression     Family History  Problem Relation Age of Onset  . Pancreatic cancer Father 29    deceased  . Heart failure Mother 104    deceased  . Leukemia Brother 65    deceased  . Diabetes type II    . Alcohol abuse      History   Social History  . Marital Status: Widowed    Spouse Name: N/A    Number of Children: N/A  . Years of Education: N/A   Social History Main Topics  . Smoking status: Never Smoker   . Smokeless tobacco:  Never Used  . Alcohol Use: No  . Drug Use: No  . Sexually Active: None   Other Topics Concern  . None   Social History Narrative  . None    Outpatient Encounter Prescriptions as of 10/27/2012  Medication Sig Dispense Refill  . amLODipine (NORVASC) 5 MG tablet TAKE 1 TABLET ONCE DAILY.  90 tablet  3  . Ascorbic Acid (VITAMIN C) 500 MG tablet Take 500 mg by mouth daily.        Marland Kitchen aspirin 81 MG chewable tablet Chew 81 mg by mouth daily.        Marland Kitchen losartan (COZAAR) 100 MG tablet TAKE 1 TABLET ONCE DAILY.  90 tablet  1  . meloxicam (MOBIC) 15 MG tablet Take 1 tablet by mouth daily.      . Mirabegron ER (MYRBETRIQ) 25 MG TB24 Take 25 tablets by mouth daily. Prescribed by urology       . Pyridoxine HCl (VITAMIN B-6) 250 MG tablet Take 250 mg by mouth daily.      . vitamin E (VITAMIN E) 400 UNIT capsule Take 400 Units by mouth daily.      Marland Kitchen  azithromycin (ZITHROMAX Z-PAK) 250 MG tablet Take 1 tablet (250 mg total) by mouth as directed. Take 2 po first day, then 1 po qd  6 each  0  . HYDROcodone-homatropine (HYCODAN) 5-1.5 MG/5ML syrup Take 5 mLs by mouth every 4 (four) hours as needed for cough.  180 mL  0   No facility-administered encounter medications on file as of 10/27/2012.    EXAM:  BP 156/72  Pulse 85  Temp(Src) 98.2 F (36.8 C) (Oral)  Wt 151 lb (68.493 kg)  BMI 25.13 kg/m2  SpO2 97%  Body mass index is 25.13 kg/(m^2).  GENERAL: vitals reviewed and listed above, alert, oriented, appears well hydrated and in no acute distress mildy hoarse   Alert  With walker   Independent  Quiet resp  Cough thick phelgm   HEENT: atraumatic, conjunctiva  clear, no obvious abnormalities on inspection of external nose and ears  tms NAD  Nares  slight congestion no face pain OP : no lesion edema or exudate  Mild  Redness  No lesion NECK: no obvious masses on inspection palpation  No adenopathy  LUNGS: clear to auscultation bilaterally, no wheezes, rales or rhonchi,  CV: HRRR, no clubbing cyanosis  or nl cap refill  MS: moves all extremities without noticeable focal  Abnormality uses walker  PSYCH: pleasant and cooperative, no obvious depression or anxiety.  ASSESSMENT AND PLAN:  Discussed the following assessment and plan:  Acute bacterial bronchitis ? - some impro=vmeent but rx because of blood phegm  Non toxic appearance today and last c xray  June was normal .  Fu if  persistent or progressive can use cough med if needed for comfort .  Risk benefit of medication discussed.  -Patient advised to return or notify health care team  if symptoms worsen or persist or new concerns arise.  Patient Instructions  This could be a bacterial bronchitis  Because of the blood .  In the phlegm.   Begin antibiotic  If needed for comfort can add   Cough medication at night . Can make you drowsy   If not  imrpoving in the next week  Contact us for  Further advice .   Your oxygen level is good today.    Neta Mends. Jerriah Ines M.D.

## 2012-11-02 ENCOUNTER — Other Ambulatory Visit: Payer: Self-pay | Admitting: Internal Medicine

## 2012-11-17 DIAGNOSIS — H1045 Other chronic allergic conjunctivitis: Secondary | ICD-10-CM | POA: Diagnosis not present

## 2012-11-17 DIAGNOSIS — H01119 Allergic dermatitis of unspecified eye, unspecified eyelid: Secondary | ICD-10-CM | POA: Diagnosis not present

## 2012-11-17 DIAGNOSIS — H43819 Vitreous degeneration, unspecified eye: Secondary | ICD-10-CM | POA: Diagnosis not present

## 2012-11-17 DIAGNOSIS — H04129 Dry eye syndrome of unspecified lacrimal gland: Secondary | ICD-10-CM | POA: Diagnosis not present

## 2012-11-17 DIAGNOSIS — H26499 Other secondary cataract, unspecified eye: Secondary | ICD-10-CM | POA: Diagnosis not present

## 2012-11-17 DIAGNOSIS — Z961 Presence of intraocular lens: Secondary | ICD-10-CM | POA: Diagnosis not present

## 2012-11-22 DIAGNOSIS — R350 Frequency of micturition: Secondary | ICD-10-CM | POA: Diagnosis not present

## 2012-11-22 DIAGNOSIS — N3941 Urge incontinence: Secondary | ICD-10-CM | POA: Diagnosis not present

## 2012-12-09 DIAGNOSIS — N3941 Urge incontinence: Secondary | ICD-10-CM | POA: Diagnosis not present

## 2012-12-09 DIAGNOSIS — N3020 Other chronic cystitis without hematuria: Secondary | ICD-10-CM | POA: Diagnosis not present

## 2012-12-09 DIAGNOSIS — N393 Stress incontinence (female) (male): Secondary | ICD-10-CM | POA: Diagnosis not present

## 2012-12-27 DIAGNOSIS — R350 Frequency of micturition: Secondary | ICD-10-CM | POA: Diagnosis not present

## 2012-12-27 DIAGNOSIS — N3941 Urge incontinence: Secondary | ICD-10-CM | POA: Diagnosis not present

## 2013-01-17 DIAGNOSIS — N3941 Urge incontinence: Secondary | ICD-10-CM | POA: Diagnosis not present

## 2013-01-25 DIAGNOSIS — Z23 Encounter for immunization: Secondary | ICD-10-CM | POA: Diagnosis not present

## 2013-01-30 ENCOUNTER — Other Ambulatory Visit: Payer: Self-pay | Admitting: Internal Medicine

## 2013-02-01 DIAGNOSIS — M48061 Spinal stenosis, lumbar region without neurogenic claudication: Secondary | ICD-10-CM | POA: Diagnosis not present

## 2013-02-01 DIAGNOSIS — IMO0002 Reserved for concepts with insufficient information to code with codable children: Secondary | ICD-10-CM | POA: Diagnosis not present

## 2013-02-01 DIAGNOSIS — M47817 Spondylosis without myelopathy or radiculopathy, lumbosacral region: Secondary | ICD-10-CM | POA: Diagnosis not present

## 2013-02-14 DIAGNOSIS — N3941 Urge incontinence: Secondary | ICD-10-CM | POA: Diagnosis not present

## 2013-03-07 DIAGNOSIS — M79609 Pain in unspecified limb: Secondary | ICD-10-CM | POA: Diagnosis not present

## 2013-03-07 DIAGNOSIS — L600 Ingrowing nail: Secondary | ICD-10-CM | POA: Diagnosis not present

## 2013-03-13 DIAGNOSIS — M12279 Villonodular synovitis (pigmented), unspecified ankle and foot: Secondary | ICD-10-CM | POA: Diagnosis not present

## 2013-03-14 DIAGNOSIS — N3941 Urge incontinence: Secondary | ICD-10-CM | POA: Diagnosis not present

## 2013-03-14 DIAGNOSIS — R350 Frequency of micturition: Secondary | ICD-10-CM | POA: Diagnosis not present

## 2013-04-18 DIAGNOSIS — N3941 Urge incontinence: Secondary | ICD-10-CM | POA: Diagnosis not present

## 2013-04-18 DIAGNOSIS — R350 Frequency of micturition: Secondary | ICD-10-CM | POA: Diagnosis not present

## 2013-05-02 DIAGNOSIS — N3941 Urge incontinence: Secondary | ICD-10-CM | POA: Diagnosis not present

## 2013-05-02 DIAGNOSIS — R350 Frequency of micturition: Secondary | ICD-10-CM | POA: Diagnosis not present

## 2013-05-09 ENCOUNTER — Telehealth: Payer: Self-pay | Admitting: Internal Medicine

## 2013-05-09 MED ORDER — MELOXICAM 15 MG PO TABS: 15.0000 mg | ORAL_TABLET | Freq: Every day | ORAL | Status: DC

## 2013-05-09 NOTE — Telephone Encounter (Signed)
Pt is requesting rx meloxicam 15 mg sent to [facility]

## 2013-05-09 NOTE — Telephone Encounter (Signed)
rx sent in electronically 

## 2013-05-15 DIAGNOSIS — M25519 Pain in unspecified shoulder: Secondary | ICD-10-CM | POA: Diagnosis not present

## 2013-05-22 ENCOUNTER — Other Ambulatory Visit: Payer: Self-pay | Admitting: Internal Medicine

## 2013-05-23 DIAGNOSIS — N390 Urinary tract infection, site not specified: Secondary | ICD-10-CM | POA: Diagnosis not present

## 2013-05-23 DIAGNOSIS — M6281 Muscle weakness (generalized): Secondary | ICD-10-CM | POA: Diagnosis not present

## 2013-05-25 DIAGNOSIS — M6281 Muscle weakness (generalized): Secondary | ICD-10-CM | POA: Diagnosis not present

## 2013-05-26 DIAGNOSIS — M6281 Muscle weakness (generalized): Secondary | ICD-10-CM | POA: Diagnosis not present

## 2013-05-29 DIAGNOSIS — M25519 Pain in unspecified shoulder: Secondary | ICD-10-CM | POA: Diagnosis not present

## 2013-05-30 DIAGNOSIS — M6281 Muscle weakness (generalized): Secondary | ICD-10-CM | POA: Diagnosis not present

## 2013-05-31 DIAGNOSIS — M6281 Muscle weakness (generalized): Secondary | ICD-10-CM | POA: Diagnosis not present

## 2013-06-01 DIAGNOSIS — M6281 Muscle weakness (generalized): Secondary | ICD-10-CM | POA: Diagnosis not present

## 2013-06-02 DIAGNOSIS — M6281 Muscle weakness (generalized): Secondary | ICD-10-CM | POA: Diagnosis not present

## 2013-06-05 DIAGNOSIS — R279 Unspecified lack of coordination: Secondary | ICD-10-CM | POA: Diagnosis not present

## 2013-06-05 DIAGNOSIS — R262 Difficulty in walking, not elsewhere classified: Secondary | ICD-10-CM | POA: Diagnosis not present

## 2013-06-05 DIAGNOSIS — M6281 Muscle weakness (generalized): Secondary | ICD-10-CM | POA: Diagnosis not present

## 2013-06-06 DIAGNOSIS — R262 Difficulty in walking, not elsewhere classified: Secondary | ICD-10-CM | POA: Diagnosis not present

## 2013-06-06 DIAGNOSIS — M6281 Muscle weakness (generalized): Secondary | ICD-10-CM | POA: Diagnosis not present

## 2013-06-06 DIAGNOSIS — R279 Unspecified lack of coordination: Secondary | ICD-10-CM | POA: Diagnosis not present

## 2013-06-07 DIAGNOSIS — M6281 Muscle weakness (generalized): Secondary | ICD-10-CM | POA: Diagnosis not present

## 2013-06-07 DIAGNOSIS — R279 Unspecified lack of coordination: Secondary | ICD-10-CM | POA: Diagnosis not present

## 2013-06-07 DIAGNOSIS — R262 Difficulty in walking, not elsewhere classified: Secondary | ICD-10-CM | POA: Diagnosis not present

## 2013-06-08 DIAGNOSIS — R262 Difficulty in walking, not elsewhere classified: Secondary | ICD-10-CM | POA: Diagnosis not present

## 2013-06-08 DIAGNOSIS — R279 Unspecified lack of coordination: Secondary | ICD-10-CM | POA: Diagnosis not present

## 2013-06-08 DIAGNOSIS — M6281 Muscle weakness (generalized): Secondary | ICD-10-CM | POA: Diagnosis not present

## 2013-06-09 DIAGNOSIS — M6281 Muscle weakness (generalized): Secondary | ICD-10-CM | POA: Diagnosis not present

## 2013-06-09 DIAGNOSIS — R262 Difficulty in walking, not elsewhere classified: Secondary | ICD-10-CM | POA: Diagnosis not present

## 2013-06-09 DIAGNOSIS — R279 Unspecified lack of coordination: Secondary | ICD-10-CM | POA: Diagnosis not present

## 2013-06-12 DIAGNOSIS — R262 Difficulty in walking, not elsewhere classified: Secondary | ICD-10-CM | POA: Diagnosis not present

## 2013-06-12 DIAGNOSIS — R279 Unspecified lack of coordination: Secondary | ICD-10-CM | POA: Diagnosis not present

## 2013-06-12 DIAGNOSIS — R488 Other symbolic dysfunctions: Secondary | ICD-10-CM | POA: Diagnosis not present

## 2013-06-12 DIAGNOSIS — M48061 Spinal stenosis, lumbar region without neurogenic claudication: Secondary | ICD-10-CM | POA: Diagnosis not present

## 2013-07-06 ENCOUNTER — Encounter: Payer: Self-pay | Admitting: Internal Medicine

## 2013-07-12 DIAGNOSIS — M6281 Muscle weakness (generalized): Secondary | ICD-10-CM | POA: Diagnosis not present

## 2013-07-12 DIAGNOSIS — Z9181 History of falling: Secondary | ICD-10-CM | POA: Diagnosis not present

## 2013-07-13 DIAGNOSIS — Z9181 History of falling: Secondary | ICD-10-CM | POA: Diagnosis not present

## 2013-07-13 DIAGNOSIS — M6281 Muscle weakness (generalized): Secondary | ICD-10-CM | POA: Diagnosis not present

## 2013-07-17 DIAGNOSIS — M6281 Muscle weakness (generalized): Secondary | ICD-10-CM | POA: Diagnosis not present

## 2013-07-17 DIAGNOSIS — Z9181 History of falling: Secondary | ICD-10-CM | POA: Diagnosis not present

## 2013-07-19 DIAGNOSIS — M6281 Muscle weakness (generalized): Secondary | ICD-10-CM | POA: Diagnosis not present

## 2013-07-19 DIAGNOSIS — Z9181 History of falling: Secondary | ICD-10-CM | POA: Diagnosis not present

## 2013-07-20 DIAGNOSIS — M6281 Muscle weakness (generalized): Secondary | ICD-10-CM | POA: Diagnosis not present

## 2013-07-20 DIAGNOSIS — Z9181 History of falling: Secondary | ICD-10-CM | POA: Diagnosis not present

## 2013-07-24 DIAGNOSIS — Z9181 History of falling: Secondary | ICD-10-CM | POA: Diagnosis not present

## 2013-07-24 DIAGNOSIS — M6281 Muscle weakness (generalized): Secondary | ICD-10-CM | POA: Diagnosis not present

## 2013-07-26 DIAGNOSIS — Z9181 History of falling: Secondary | ICD-10-CM | POA: Diagnosis not present

## 2013-07-26 DIAGNOSIS — M6281 Muscle weakness (generalized): Secondary | ICD-10-CM | POA: Diagnosis not present

## 2013-07-27 DIAGNOSIS — M6281 Muscle weakness (generalized): Secondary | ICD-10-CM | POA: Diagnosis not present

## 2013-07-27 DIAGNOSIS — Z9181 History of falling: Secondary | ICD-10-CM | POA: Diagnosis not present

## 2013-08-05 ENCOUNTER — Other Ambulatory Visit: Payer: Self-pay | Admitting: Internal Medicine

## 2013-08-19 ENCOUNTER — Other Ambulatory Visit: Payer: Self-pay | Admitting: Internal Medicine

## 2013-11-07 ENCOUNTER — Other Ambulatory Visit: Payer: Self-pay | Admitting: Internal Medicine

## 2013-11-08 ENCOUNTER — Telehealth: Payer: Self-pay | Admitting: Internal Medicine

## 2013-11-08 MED ORDER — LOSARTAN POTASSIUM 100 MG PO TABS: ORAL_TABLET | ORAL | Status: DC

## 2013-11-08 NOTE — Telephone Encounter (Signed)
rx sent in electroncially 

## 2013-11-08 NOTE — Telephone Encounter (Signed)
Pt request refill of the following:  losartan (COZAAR) 100 MG tablet  Pt said she had lab work done today and is now needing the above medicine refill   Phamacy:  [facility]

## 2013-11-13 ENCOUNTER — Encounter: Payer: Self-pay | Admitting: Family Medicine

## 2013-11-13 ENCOUNTER — Ambulatory Visit (INDEPENDENT_AMBULATORY_CARE_PROVIDER_SITE_OTHER): Payer: Medicare Other | Admitting: Family Medicine

## 2013-11-13 VITALS — BP 168/78 | HR 78 | Temp 98.4°F | Wt 146.0 lb

## 2013-11-13 DIAGNOSIS — R1013 Epigastric pain: Secondary | ICD-10-CM | POA: Insufficient documentation

## 2013-11-13 LAB — CBC WITH DIFFERENTIAL/PLATELET
BASOS ABS: 0 10*3/uL (ref 0.0–0.1)
Basophils Relative: 0.3 % (ref 0.0–3.0)
Eosinophils Absolute: 0 10*3/uL (ref 0.0–0.7)
Eosinophils Relative: 0 % (ref 0.0–5.0)
HEMATOCRIT: 41.1 % (ref 36.0–46.0)
HEMOGLOBIN: 14 g/dL (ref 12.0–15.0)
LYMPHS ABS: 0.6 10*3/uL — AB (ref 0.7–4.0)
Lymphocytes Relative: 4.6 % — ABNORMAL LOW (ref 12.0–46.0)
MCHC: 34.1 g/dL (ref 30.0–36.0)
MCV: 92.2 fl (ref 78.0–100.0)
MONO ABS: 1 10*3/uL (ref 0.1–1.0)
Monocytes Relative: 7.2 % (ref 3.0–12.0)
NEUTROS ABS: 11.8 10*3/uL — AB (ref 1.4–7.7)
Neutrophils Relative %: 87.9 % — ABNORMAL HIGH (ref 43.0–77.0)
PLATELETS: 385 10*3/uL (ref 150.0–400.0)
RBC: 4.46 Mil/uL (ref 3.87–5.11)
RDW: 13.2 % (ref 11.5–15.5)
WBC: 13.4 10*3/uL — ABNORMAL HIGH (ref 4.0–10.5)

## 2013-11-13 LAB — COMPREHENSIVE METABOLIC PANEL
ALK PHOS: 61 U/L (ref 39–117)
ALT: 23 U/L (ref 0–35)
AST: 27 U/L (ref 0–37)
Albumin: 3.9 g/dL (ref 3.5–5.2)
BUN: 13 mg/dL (ref 6–23)
CO2: 27 meq/L (ref 19–32)
CREATININE: 0.7 mg/dL (ref 0.4–1.2)
Calcium: 10.1 mg/dL (ref 8.4–10.5)
Chloride: 99 mEq/L (ref 96–112)
GFR: 83.97 mL/min (ref >60.00)
Glucose, Bld: 117 mg/dL — ABNORMAL HIGH (ref 70–99)
Potassium: 4.2 mEq/L (ref 3.5–5.1)
Sodium: 133 mEq/L — ABNORMAL LOW (ref 135–145)
Total Bilirubin: 0.7 mg/dL (ref 0.2–1.2)
Total Protein: 7.5 g/dL (ref 6.0–8.3)

## 2013-11-13 LAB — LIPASE: LIPASE: 15 U/L (ref 11.0–59.0)

## 2013-11-13 LAB — H. PYLORI ANTIBODY, IGG: H Pylori IgG: NEGATIVE

## 2013-11-13 MED ORDER — PANTOPRAZOLE SODIUM 40 MG PO TBEC: 40.0000 mg | DELAYED_RELEASE_TABLET | Freq: Every day | ORAL | Status: DC

## 2013-11-13 NOTE — Progress Notes (Signed)
Garret Reddish, MD Phone: [phone]  Subjective:   Brandy Crawford is a 80 y.o. year old very pleasant female patient who presents with the following:  Epigastric abdominal pain States has had a low level pain for almost 2 years. 2 years ago was treated with omeprazole which seemed to help some but she only took it intermittently. Pain in epigastric area and radiates to RUQ and some into RLQ. Rates pain as severe over last week. She is not sure what changed. Takes mobic chronically for sciatica. States pain is better with meals and worse as she gets a few hours out from a meal. Not exertional and not relieved by rest. She has some shortness of breath which was evaluated by Dr. Leanne Chang before and thought to be deconditioning after largely normal echo (grade I diastolic dysfunction EF 56%) but this has not recently changed. She has lost 5 lbs in last month. Pain is constant except for when she eats and it reduces some.  ROS- denies fever/chills/vomiting. Felt mild nausea last night for first time. Daily bowel movements occasionally strains but feels better with prune juice. No melena or brbpr. No dysphagia.   Pertinent Past Medical History- HLD (untreated), HTN, chronic NSAID use, history of 3 TIA near 2000 (on aspirin)  Medications- reviewed and updated Current Outpatient Prescriptions  Medication Sig Dispense Refill  . amLODipine (NORVASC) 5 MG tablet TAKE 1 TABLET ONCE DAILY.  90 tablet  0  . Ascorbic Acid (VITAMIN C) 500 MG tablet Take 500 mg by mouth daily.        Marland Kitchen aspirin 81 MG chewable tablet Chew 81 mg by mouth daily.        Marland Kitchen losartan (COZAAR) 100 MG tablet TAKE 1 TABLET ONCE DAILY.  90 tablet  0  . meloxicam (MOBIC) 15 MG tablet Take 1 tablet (15 mg total) by mouth daily.  30 tablet  0  . Mirabegron ER (MYRBETRIQ) 25 MG TB24 Take 25 tablets by mouth daily. Prescribed by urology       . Pyridoxine HCl (VITAMIN B-6) 250 MG tablet Take 250 mg by mouth daily.      . vitamin E  (VITAMIN E) 400 UNIT capsule Take 400 Units by mouth daily.        Objective: BP 168/78  Pulse 78  Temp(Src) 98.4 F (36.9 C)  Wt 146 lb (66.225 kg)  SpO2 97% Gen: appears mildly uncomfortable CV: RRR no murmurs rubs or gallops Lungs: CTAB no crackles, wheeze, rhonchi, shallow breaths with rate 20-22 Abdomen: soft/nondistended/normal bowel sounds. No rebound or guarding. Patient with diffuse pain with palpation worse in epigastric area.  Ext: 1+ edema  Assessment/Plan:  Abdominal pain, epigastric Given history chronic nsaids as well as pain relief with eating, strongly suspect duodenal ulcer. Plan for PPI x minimum 8 weeks. Labs as listed below. If pain were to persist despite course, consider GI referral or CT abd/pelvis.    Abdominal exam without signs of acute abdomen to suggest perforation, cholecystitis or appendicitis although these especially cholecystitis remain in differential (though lower). Discussed MI (risk: HLD, HTN, tia x 3) or cancer as possibilities and importance of follow up. Considered ekg and 1x troponin but given classic history and no exertional element, deferred. Discussed warning signs for sooner follow up (plan 1 week at present).    BP thought to be elevated secondary to pain.   Orders Placed This Encounter  Procedures  . CBC with Differential  . Comprehensive metabolic panel  [facility]  . Lipase  . H. pylori Antibody, IgG    Meds ordered this encounter  Medications  . pantoprazole (PROTONIX) 40 MG tablet    Sig: Take 1 tablet (40 mg total) by mouth daily. Minimum of 8 weeks    Dispense:  30 tablet    Refill:  1

## 2013-11-13 NOTE — Assessment & Plan Note (Addendum)
Given history chronic nsaids as well as pain relief with eating, strongly suspect duodenal ulcer. Plan for PPI x minimum 8 weeks. Labs as listed below. If pain were to persist despite course, consider GI referral or CT abd/pelvis.    Abdominal exam without signs of acute abdomen to suggest perforation, cholecystitis or appendicitis although these especially cholecystitis remain in differential (though lower). Discussed MI (risk: HLD, HTN, tia x 3) or cancer as possibilities and importance of follow up. Considered ekg and 1x troponin but given classic history and no exertional element, deferred. Discussed warning signs for sooner follow up (plan 1 week at present).

## 2013-11-13 NOTE — Patient Instructions (Signed)
I suspect this is an ulcer just past your stomach (duodenum).  Treatment for this includes 8 weeks of treatment with an anti-ulcer medicine (prescribed pantoprazole) Please stop taking mobic as this can cause ulcers.  Checking labs today-will call with result.   We discussed that this pain could be a sign of something more serious such as a heart attack or a type of stomach cancer. I strongly doubt these based on your symptoms but would like to follow up with you next week to see how you are doing. If your symptoms worsen or if you develop chest pain or worsening shortness of breath, I would want you to seek care immediately.   Thanks, Dr. Yong Channel

## 2013-11-21 ENCOUNTER — Encounter: Payer: Self-pay | Admitting: Family Medicine

## 2013-11-21 ENCOUNTER — Ambulatory Visit (INDEPENDENT_AMBULATORY_CARE_PROVIDER_SITE_OTHER): Payer: Medicare Other | Admitting: Family Medicine

## 2013-11-21 VITALS — BP 140/70 | HR 68 | Temp 98.8°F | Wt 132.0 lb

## 2013-11-21 DIAGNOSIS — R269 Unspecified abnormalities of gait and mobility: Secondary | ICD-10-CM

## 2013-11-21 DIAGNOSIS — R1013 Epigastric pain: Secondary | ICD-10-CM | POA: Diagnosis not present

## 2013-11-21 DIAGNOSIS — J45909 Unspecified asthma, uncomplicated: Secondary | ICD-10-CM | POA: Diagnosis not present

## 2013-11-21 DIAGNOSIS — M810 Age-related osteoporosis without current pathological fracture: Secondary | ICD-10-CM

## 2013-11-21 NOTE — Progress Notes (Signed)
  Brandy Reddish, MD Phone: [phone]  Subjective:   Brandy Crawford is a 80 y.o. year old very pleasant female patient who presents with the following:  Epigastric abdominal pain/presumed duodenal ulcer  Pain has improved to 5/10 down from 10/10 at last visit. Taking medicine daily as well as tylenol. Has some diarrhea with scrambled eggs but not with diarrhea. Does endorse some fatigue that is improving. Avoiding meloxicam. Pain still better with meals and worse when she doesn't eat but admits she is eating less.   ROS- denies fever/chills/nauea/vomiting.  Daily bowel movements with diarrhea as mentioned above. No melena or brbpr. No dysphagia.   Pertinent Past Medical History- HLD (untreated), HTN, chronic NSAID use, history of 3 TIA near 2000 (on aspirin)  Medications- reviewed and updated Current Outpatient Prescriptions  Medication Sig Dispense Refill  . amLODipine (NORVASC) 5 MG tablet TAKE 1 TABLET ONCE DAILY.  90 tablet  0  . Ascorbic Acid (VITAMIN C) 500 MG tablet Take 500 mg by mouth daily.        Marland Kitchen aspirin 81 MG chewable tablet Chew 81 mg by mouth daily.        Marland Kitchen losartan (COZAAR) 100 MG tablet TAKE 1 TABLET ONCE DAILY.  90 tablet  0  . meloxicam (MOBIC) 15 MG tablet Take 1 tablet (15 mg total) by mouth daily.  30 tablet  0  . Mirabegron ER (MYRBETRIQ) 25 MG TB24 Take 25 tablets by mouth daily. Prescribed by urology       . pantoprazole (PROTONIX) 40 MG tablet Take 1 tablet (40 mg total) by mouth daily. Minimum of 8 weeks  30 tablet  1  . Pyridoxine HCl (VITAMIN B-6) 250 MG tablet Take 250 mg by mouth daily.      . vitamin E (VITAMIN E) 400 UNIT capsule Take 400 Units by mouth daily.       No current facility-administered medications for this visit.   Objective: BP 140/70  Pulse 68  Temp(Src) 98.8 F (37.1 C)  Wt 132 lb (59.875 kg) Gen: NAD CV: RRR no murmurs rubs or gallops Lungs: CTAB no crackles, wheeze, rhonchi, RR 18 Abdomen: soft/nondistended/normal bowel  sounds. No rebound or guarding. Patient with mild pain to palpation in RUQ, LUQ and epigastric area  Ext: 1+ edema unchanged  Assessment/Plan:  Abdominal pain, epigastric Continue to believe this is due to duodenal ulcer. Pain improved at 10 days on PPI. Continue this as well as avoidance as possible of mobic. Patient has follow up with me on 8/19 to establish care. Advised of reasons for sooner return. If patient's pain were to continue despite 8 week course and nsaid avoidance, would refer to GI vs. Consider CT abd/pelvis.

## 2013-11-21 NOTE — Patient Instructions (Signed)
Glad your pain is doing better.  I want you to take the pantoprazole a full 8 weeks. Try to avoid mobic but can occasionally take on bad days. Can continue to use tylenol.  Want to recheck the lab that was high last week. Will call with results.   I would like to see you back in 6 weeks to check on your weight and your pain. If your symptoms worsen or if you develop chest pain or worsening shortness of breath, I would want you to seek care immediately.   Thanks, Dr. Yong Channel

## 2013-11-21 NOTE — Assessment & Plan Note (Signed)
Continue to believe this is due to duodenal ulcer. Pain improved at 10 days on PPI. Continue this as well as avoidance as possible of mobic. Patient has follow up with me on 8/19 to establish care. Advised of reasons for sooner return. If patient's pain were to continue despite 8 week course and nsaid avoidance, would refer to GI vs. Consider CT abd/pelvis.

## 2013-11-25 ENCOUNTER — Other Ambulatory Visit: Payer: Self-pay | Admitting: Internal Medicine

## 2013-11-28 ENCOUNTER — Telehealth: Payer: Self-pay | Admitting: Family Medicine

## 2013-11-28 ENCOUNTER — Ambulatory Visit (INDEPENDENT_AMBULATORY_CARE_PROVIDER_SITE_OTHER): Payer: Medicare Other | Admitting: Physician Assistant

## 2013-11-28 ENCOUNTER — Encounter: Payer: Self-pay | Admitting: Physician Assistant

## 2013-11-28 VITALS — BP 122/78 | HR 96 | Temp 98.0°F | Resp 18 | Wt 135.0 lb

## 2013-11-28 DIAGNOSIS — R197 Diarrhea, unspecified: Secondary | ICD-10-CM | POA: Diagnosis not present

## 2013-11-28 LAB — CBC WITH DIFFERENTIAL/PLATELET
Basophils Absolute: 0 10*3/uL (ref 0.0–0.1)
Basophils Relative: 0.1 % (ref 0.0–3.0)
Eosinophils Absolute: 0.1 10*3/uL (ref 0.0–0.7)
Eosinophils Relative: 0.9 % (ref 0.0–5.0)
HCT: 44.3 % (ref 36.0–46.0)
Hemoglobin: 14.8 g/dL (ref 12.0–15.0)
Lymphocytes Relative: 6.3 % — ABNORMAL LOW (ref 12.0–46.0)
Lymphs Abs: 0.8 10*3/uL (ref 0.7–4.0)
MCHC: 33.5 g/dL (ref 30.0–36.0)
MCV: 94.2 fl (ref 78.0–100.0)
Monocytes Absolute: 0.9 10*3/uL (ref 0.1–1.0)
Monocytes Relative: 6.9 % (ref 3.0–12.0)
Neutro Abs: 10.9 10*3/uL — ABNORMAL HIGH (ref 1.4–7.7)
Neutrophils Relative %: 85.8 % — ABNORMAL HIGH (ref 43.0–77.0)
Platelets: 440 10*3/uL — ABNORMAL HIGH (ref 150.0–400.0)
RBC: 4.7 Mil/uL (ref 3.87–5.11)
RDW: 13.3 % (ref 11.5–15.5)
WBC: 12.6 10*3/uL — ABNORMAL HIGH (ref 4.0–10.5)

## 2013-11-28 NOTE — Telephone Encounter (Signed)
Noted  

## 2013-11-28 NOTE — Progress Notes (Signed)
Subjective:    Patient ID: Brandy Crawford, female    DOB: Aug 03, 1923, 80 y.o.   MRN: [id number]  Diarrhea  This is a new problem. The current episode started today. The problem occurs 5 to 10 times per day (Pt had 4 episodes of bloody diarrhea this morning, and then 1 loose stool upon arrival today which she states was not bloody.). The stool consistency is described as bloody ("medium red" not dark black, no bright red.). The patient states that diarrhea does not awaken her from sleep. Associated symptoms include abdominal pain (upper abdominal/epigastric pain, currently being treated for an ulcer. No lower abdominal pain). Pertinent negatives include no arthralgias, bloating, chills, coughing, fever, headaches, increased  flatus, myalgias, sweats, URI, vomiting or weight loss. Exacerbated by: started after breakfast, but she is unsure if this is related.  There are no known risk factors. She has tried nothing for the symptoms. There is no history of bowel resection, inflammatory bowel disease, irritable bowel syndrome, malabsorption, a recent abdominal surgery or short gut syndrome. has an Ulcer currently, currently treated with pantoprazole.      Review of Systems  Constitutional: Positive for fatigue. Negative for fever, chills and weight loss.  Respiratory: Negative for cough and shortness of breath.   Cardiovascular: Negative for chest pain.  Gastrointestinal: Positive for abdominal pain (upper abdominal/epigastric pain, currently being treated for an ulcer. No lower abdominal pain) and diarrhea. Negative for nausea, vomiting, bloating and flatus.  Musculoskeletal: Negative for arthralgias and myalgias.  Neurological: Negative for syncope, light-headedness and headaches.  All other systems reviewed and are negative.     Past Medical History  Diagnosis Date  . Asthma   . GERD (gastroesophageal reflux disease)   . Hypertension   . Osteoporosis   . TIA (transient ischemic attack)    . Hyperlipemia   . Cervical disc disease   . Anemia   . Depression     History   Social History  . Marital Status: Widowed    Spouse Name: N/A    Number of Children: N/A  . Years of Education: N/A   Occupational History  . Not on file.   Social History Main Topics  . Smoking status: Never Smoker   . Smokeless tobacco: Never Used  . Alcohol Use: No  . Drug Use: No  . Sexual Activity: Not on file   Other Topics Concern  . Not on file   Social History Narrative  . No narrative on file    Past Surgical History  Procedure Laterality Date  . Appendectomy    . General hysterectomy    . Tonsillectomy    . Cholecystectomy      Family History  Problem Relation Age of Onset  . Pancreatic cancer Father 38    deceased  . Heart failure Mother 104    deceased  . Leukemia Brother 50    deceased  . Diabetes type II    . Alcohol abuse      Allergies  Allergen Reactions  . Amoxicillin     REACTION: unspecified  . Penicillins     REACTION: rash  . Sulfamethoxazole     REACTION: unspecified    Current Outpatient Prescriptions on File Prior to Visit  Medication Sig Dispense Refill  . amLODipine (NORVASC) 5 MG tablet TAKE 1 TABLET ONCE DAILY.  90 tablet  0  . Ascorbic Acid (VITAMIN C) 500 MG tablet Take 500 mg by mouth daily.        Marland Kitchen  aspirin 81 MG chewable tablet Chew 81 mg by mouth daily.        Marland Kitchen losartan (COZAAR) 100 MG tablet TAKE 1 TABLET ONCE DAILY.  90 tablet  0  . meloxicam (MOBIC) 15 MG tablet Take 1 tablet (15 mg total) by mouth daily.  30 tablet  0  . Mirabegron ER (MYRBETRIQ) 25 MG TB24 Take 25 tablets by mouth daily. Prescribed by urology       . pantoprazole (PROTONIX) 40 MG tablet Take 1 tablet (40 mg total) by mouth daily. Minimum of 8 weeks  30 tablet  1  . Pyridoxine HCl (VITAMIN B-6) 250 MG tablet Take 250 mg by mouth daily.      . vitamin E (VITAMIN E) 400 UNIT capsule Take 400 Units by mouth daily.       No current facility-administered  medications on file prior to visit.    EXAM: BP 122/78  Pulse 96  Temp(Src) 98 F (36.7 C) (Oral)  Resp 18  Wt 135 lb (61.236 kg)  SpO2 95%     Objective:   Physical Exam  Nursing note and vitals reviewed. Constitutional: She is oriented to person, place, and time. She appears well-developed and well-nourished. No distress.  HENT:  Head: Normocephalic and atraumatic.  Eyes: Conjunctivae and EOM are normal. Pupils are equal, round, and reactive to light.  Cardiovascular: Normal rate, regular rhythm and intact distal pulses.   Pulmonary/Chest: Effort normal and breath sounds normal. No respiratory distress. She has no wheezes. She has no rales. She exhibits no tenderness.  Abdominal: Soft. Bowel sounds are normal. She exhibits no distension and no mass. There is tenderness (mild epigastric ttp.). There is no rebound and no guarding.  Genitourinary: Guaiac positive stool.  Rectal exam grossly normal.  Neurological: She is alert and oriented to person, place, and time.  Skin: Skin is warm and dry. No rash noted. She is not diaphoretic. No erythema. No pallor.  Psychiatric: She has a normal mood and affect. Her behavior is normal. Judgment and thought content normal.     Lab Results  Component Value Date   WBC 13.4* 11/13/2013   HGB 14.0 11/13/2013   HCT 41.1 11/13/2013   PLT 385.0 11/13/2013   GLUCOSE 117* 11/13/2013   CHOL 204* 12/08/2007   TRIG 127 12/08/2007   HDL 70.6 12/08/2007   LDLDIRECT 114.3 12/08/2007   LDLCALC 108* 12/08/2007   ALT 23 11/13/2013   AST 27 11/13/2013   NA 133* 11/13/2013   K 4.2 11/13/2013   CL 99 11/13/2013   CREATININE 0.7 11/13/2013   BUN 13 11/13/2013   CO2 27 11/13/2013   TSH 1.32 09/28/2012   HGBA1C 5.9 08/24/2011        Assessment & Plan:  Cathleen was seen today for bloody diarrhea.  Diagnoses and associated orders for this visit:  Bloody diarrhea Comments: ?resoving, hemoccult still positive. CBC, drink plenty of fluids. Follow up tomorrow with PCP as  planned. - CBC with Differential    Advised on bland diet to help diarrheal symptoms.  Return precautions provided, and patient handout on bloody diarrhea, diet for diarrhea.  Plan to follow up tomorrow as planned with PCP, or for worsening or persistent symptoms despite treatment.  Patient Instructions  We will call you with the results of your labwork when available.  Your bloody diarrhea may be resolving since she did not have blood in your last bowel movement. Continue to monitor these symptoms.  Followup as planned with your  PCP tomorrow.  Drink plenty of fluids.  Try a bland diet to help combat the diarrhea symptoms and calm your bowels.  If emergency symptoms discussed during visit developed, seek medical attention immediately.  Followup tomorrow to reassess, or for worsening or persistent symptoms despite treatment.

## 2013-11-28 NOTE — Progress Notes (Signed)
Pre visit review using our clinic review tool, if applicable. No additional management support is needed unless otherwise documented below in the visit note. 

## 2013-11-28 NOTE — Telephone Encounter (Signed)
Patient Information:  Caller Name: Solae  Phone: [phone]  Patient: Brandy Crawford  Gender: Female  DOB: Apr 12, 1924  Age: 80 Years  PCP: Garret Reddish  Office Follow Up:  Does the office need to follow up with this patient?: No  Instructions For The Office: N/A   Symptoms  Reason For Call & Symptoms: Patient reports diarrhea with blood in it.  Estimated 4 episodes.  Abdominal pain rated at 4 of 10.  Emergent symptoms ruled out.  See Today in Office per Diarrhea guideline due to Blood in the stool.  Reviewed Health History In EMR: Yes  Reviewed Medications In EMR: Yes  Reviewed Allergies In EMR: Yes  Reviewed Surgeries / Procedures: Yes  Date of Onset of Symptoms: 11/28/2013  Guideline(s) Used:  Diarrhea  Disposition Per Guideline:   See Today in Office  Reason For Disposition Reached:   Blood in the stool  Advice Given:  Fluids:  Drink more fluids, at least 8-10 glasses (8 oz or 240 ml) daily.  For example: sports drinks, diluted fruit juices, soft drinks.  Supplement this with saltine crackers or soups to make certain that you are getting sufficient fluid and salt to meet your body's needs.  Avoid caffeinated beverages (Reason: caffeine is mildly dehydrating).  Nutrition:  Ideal initial foods include boiled starches/cereals (e.g., potatoes, rice, noodles, wheat, oats) with a small amount of salt to taste.  Other acceptable foods include: bananas, yogurt, crackers, soup.  Call Back If:  Signs of dehydration occur (e.g., no urine for more than 12 hours, very dry mouth, lightheaded, etc.)  Diarrhea lasts over 7 days  You become worse.  Patient Will Follow Care Advice:  YES  Appointment Scheduled:  11/28/2013 10:15:00 Appointment Scheduled Provider:  Kela Millin (only sees ages 12 and up)

## 2013-11-28 NOTE — Patient Instructions (Addendum)
We will call you with the results of your labwork when available.  Your bloody diarrhea may be resolving since she did not have blood in your last bowel movement. Continue to monitor these symptoms.  Followup as planned with your PCP tomorrow.  Drink plenty of fluids.  Try a bland diet to help combat the diarrhea symptoms and calm your bowels.  If emergency symptoms discussed during visit developed, seek medical attention immediately.  Followup tomorrow to reassess, or for worsening or persistent symptoms despite treatment.    Bloody Diarrhea Bloody diarrhea can be caused by many different conditions. Most of the time bloody diarrhea is the result of food poisoning or minor infections. Bloody diarrhea usually improves over 2 to 3 days of rest and fluid replacement. Other conditions that can cause bloody diarrhea include:  Internal bleeding.  Infection.  Diseases of the bowel and colon. Internal bleeding from an ulcer or bowel disease can be severe and requires hospital care or even surgery. DIAGNOSIS  To find out what is wrong your caregiver may check your:  Stool.  Blood.  Results from a test that looks inside the body (endoscopy). TREATMENT   Get plenty of rest.  Drink enough water and fluids to keep your urine clear or pale yellow.  Do not smoke.  Solid foods and dairy products should be avoided until your illness improves.  As you improve, slowly return to a regular diet with easily-digested foods first. Examples are:  Bananas.  Rice.  Toast.  Crackers. You should only need these for about 2 days before adding more normal foods to your diet.  Avoid spicy or fatty foods as well as caffeine and alcohol for several days.  Medicine to control cramping and diarrhea can relieve symptoms but may prolong some cases of bloody diarrhea. Antibiotics can speed recovery from diarrhea due to some bacterial infections. Call your caregiver if diarrhea does not get better  in 3 days. SEEK MEDICAL CARE IF:   You do not improve after 3 days.  Your diarrhea improves but your stool appears black. SEEK IMMEDIATE MEDICAL CARE IF:   You become extremely weak or faint.  You become very sweaty.  You have increased pain or bleeding.  You develop repeated vomiting.  You vomit and you see blood or the vomit looks black in color.  You have a fever. Document Released: 03/30/2005 Document Revised: 06/22/2011 Document Reviewed: 03/01/2009 [facility] Patient Information 2015 [place], [place]. This information is not intended to replace advice given to you by your health care provider. Make sure you discuss any questions you have with your health care provider.

## 2013-11-29 ENCOUNTER — Encounter: Payer: Self-pay | Admitting: Family Medicine

## 2013-11-29 ENCOUNTER — Ambulatory Visit (INDEPENDENT_AMBULATORY_CARE_PROVIDER_SITE_OTHER): Payer: Medicare Other | Admitting: Family Medicine

## 2013-11-29 VITALS — BP 122/68 | HR 80 | Temp 98.9°F | Wt 144.0 lb

## 2013-11-29 DIAGNOSIS — N318 Other neuromuscular dysfunction of bladder: Secondary | ICD-10-CM

## 2013-11-29 DIAGNOSIS — R1013 Epigastric pain: Secondary | ICD-10-CM

## 2013-11-29 DIAGNOSIS — J45909 Unspecified asthma, uncomplicated: Secondary | ICD-10-CM

## 2013-11-29 DIAGNOSIS — E785 Hyperlipidemia, unspecified: Secondary | ICD-10-CM | POA: Diagnosis not present

## 2013-11-29 DIAGNOSIS — IMO0002 Reserved for concepts with insufficient information to code with codable children: Secondary | ICD-10-CM

## 2013-11-29 DIAGNOSIS — R269 Unspecified abnormalities of gait and mobility: Secondary | ICD-10-CM

## 2013-11-29 DIAGNOSIS — Z8679 Personal history of other diseases of the circulatory system: Secondary | ICD-10-CM

## 2013-11-29 DIAGNOSIS — N3281 Overactive bladder: Secondary | ICD-10-CM

## 2013-11-29 HISTORY — DX: Overactive bladder: N32.81

## 2013-11-29 NOTE — Assessment & Plan Note (Signed)
No treatment. History of TIA in 2000.

## 2013-11-29 NOTE — Assessment & Plan Note (Addendum)
Continued improvement on PPI. Given rectal bleeding yesterday I recommended GI referral at this time for colonoscopy/EGD. Patient would prefer to not move forward with this since she is feeling better. She will call me if abdominal pain worsens or if bleeding recurs and i will place referral and potentially have back in office. Otherwise, 6 week follow up planned after finishing PPI. Continue to avoid PPI.

## 2013-11-29 NOTE — Patient Instructions (Signed)
Let's see each other back in 6 weeks.  You will call me immediately if pain worsens or if you have any more blood in your stool and we will refer you to stomach doctor.  We discussed there is some risk in not going to stomach doctor now, but ultimately you decided you would hold off since you are doing much better.

## 2013-11-29 NOTE — Progress Notes (Signed)
Garret Reddish, MD Phone: [phone]  Subjective:   Brandy Crawford is a 80 y.o. year old very pleasant female patient who presents with the following:  Epigastric Pain presumed ulcer Diarrhea/rectal bleeding Pain 3/10 and continues to improve each week Seen yesterday for rectal bleeding and diarrhea. Yesterday morning was last diarrheal bowel movement in the office but no blood was noted. No blood in stool yesterday or today and diarrhea has resolved.  ROS- no melena, no nausea/vomiting. No fevers.   Hyperlipidemia On statin: no TIA in 2000 x 3. None since that time. Due to no history MI/CVA patient has not been on statin. Is on aspirin.  ROS- no chest pain or shortness of breath. No myalgias  I also completely reviewed history and updated in Epic as patient thought she was to establish care with me today.   Past Medical History- Patient Active Problem List   Diagnosis Date Noted  . Overactive bladder 11/29/2013    Priority: Medium  . Abdominal pain, epigastric. Likely due to duodenal ulcer.  11/13/2013    Priority: Medium  . Abnormality of gait 04/28/2010    Priority: Medium  . HYPERLIPIDEMIA 09/20/2006    Priority: Medium  . HYPERTENSION 09/20/2006    Priority: Medium  . OSTEOPOROSIS 09/20/2006    Priority: Medium  . TRANSIENT ISCHEMIC ATTACK, HX OF 09/20/2006    Priority: Medium  . Acute bacterial bronchitis ? 10/27/2012    Priority: Low  . DEGENERATIVE DISC DISEASE 10/28/2009    Priority: Low  . SCOLIOSIS 10/28/2009    Priority: Low  . ASTHMA 09/20/2006    Priority: Low  . GERD 09/20/2006    Priority: Low   Medications- reviewed and updated Current Outpatient Prescriptions  Medication Sig Dispense Refill  . amLODipine (NORVASC) 5 MG tablet TAKE 1 TABLET ONCE DAILY.  90 tablet  0  . Ascorbic Acid (VITAMIN C) 500 MG tablet Take 500 mg by mouth daily.        Marland Kitchen aspirin 81 MG chewable tablet Chew 81 mg by mouth daily.        Marland Kitchen losartan (COZAAR) 100 MG  tablet TAKE 1 TABLET ONCE DAILY.  90 tablet  0  . meloxicam (MOBIC) 15 MG tablet Take 1 tablet (15 mg total) by mouth daily.  30 tablet  0  . Mirabegron ER (MYRBETRIQ) 25 MG TB24 Take 25 tablets by mouth daily. Prescribed by urology       . pantoprazole (PROTONIX) 40 MG tablet Take 1 tablet (40 mg total) by mouth daily. Minimum of 8 weeks  30 tablet  1  . Pyridoxine HCl (VITAMIN B-6) 250 MG tablet Take 250 mg by mouth daily.      . vitamin E (VITAMIN E) 400 UNIT capsule Take 400 Units by mouth daily.       No current facility-administered medications for this visit.    Objective: BP 122/68  Pulse 80  Temp(Src) 98.9 F (37.2 C)  Wt 144 lb (65.318 kg) Gen: NAD, resting comfortably in chair Mouth: Mucous membranes are moist. CV: RRR no murmurs rubs or gallops Lungs: CTAB no crackles, wheeze, rhonchi Abdomen: soft/nondistended/normal bowel sounds. No rebound or guarding. Mild tenderness in epigastric and LUQ area.  Neuro: walks with walker  Assessment/Plan:  HYPERLIPIDEMIA No treatment. History of TIA in 2000.   Abdominal pain, epigastric. Likely due to duodenal ulcer.  Continued improvement on PPI. Given rectal bleeding yesterday I recommended GI referral at this time for colonoscopy/EGD. Patient would prefer to not move  forward with this since she is feeling better. She will call me if abdominal pain worsens or if bleeding recurs and i will place referral and potentially have back in office. Otherwise, 6 week follow up planned after finishing PPI. Continue to avoid PPI.   Did not stop aspirin but would consider if bleeding recurrent.

## 2014-01-13 ENCOUNTER — Emergency Department (HOSPITAL_COMMUNITY)
Admission: EM | Admit: 2014-01-13 | Discharge: 2014-01-14 | Disposition: A | Discharge status: Home / Self Care | Payer: Medicare Other | Attending: Emergency Medicine | Admitting: Emergency Medicine

## 2014-01-13 ENCOUNTER — Emergency Department (HOSPITAL_COMMUNITY): Payer: Medicare Other

## 2014-01-13 ENCOUNTER — Encounter (HOSPITAL_COMMUNITY): Payer: Self-pay | Admitting: Emergency Medicine

## 2014-01-13 DIAGNOSIS — Z79899 Other long term (current) drug therapy: Secondary | ICD-10-CM | POA: Insufficient documentation

## 2014-01-13 DIAGNOSIS — Z791 Long term (current) use of non-steroidal anti-inflammatories (NSAID): Secondary | ICD-10-CM | POA: Insufficient documentation

## 2014-01-13 DIAGNOSIS — Z8673 Personal history of transient ischemic attack (TIA), and cerebral infarction without residual deficits: Secondary | ICD-10-CM | POA: Insufficient documentation

## 2014-01-13 DIAGNOSIS — Z862 Personal history of diseases of the blood and blood-forming organs and certain disorders involving the immune mechanism: Secondary | ICD-10-CM | POA: Insufficient documentation

## 2014-01-13 DIAGNOSIS — F329 Major depressive disorder, single episode, unspecified: Secondary | ICD-10-CM | POA: Diagnosis not present

## 2014-01-13 DIAGNOSIS — I10 Essential (primary) hypertension: Secondary | ICD-10-CM | POA: Diagnosis not present

## 2014-01-13 DIAGNOSIS — J439 Emphysema, unspecified: Secondary | ICD-10-CM | POA: Diagnosis not present

## 2014-01-13 DIAGNOSIS — J45909 Unspecified asthma, uncomplicated: Secondary | ICD-10-CM | POA: Insufficient documentation

## 2014-01-13 DIAGNOSIS — J189 Pneumonia, unspecified organism: Secondary | ICD-10-CM

## 2014-01-13 DIAGNOSIS — M25512 Pain in left shoulder: Secondary | ICD-10-CM | POA: Diagnosis not present

## 2014-01-13 DIAGNOSIS — Z880 Allergy status to penicillin: Secondary | ICD-10-CM | POA: Diagnosis not present

## 2014-01-13 DIAGNOSIS — J159 Unspecified bacterial pneumonia: Secondary | ICD-10-CM | POA: Insufficient documentation

## 2014-01-13 DIAGNOSIS — Z8639 Personal history of other endocrine, nutritional and metabolic disease: Secondary | ICD-10-CM | POA: Diagnosis not present

## 2014-01-13 DIAGNOSIS — J9809 Other diseases of bronchus, not elsewhere classified: Secondary | ICD-10-CM | POA: Diagnosis not present

## 2014-01-13 DIAGNOSIS — M79602 Pain in left arm: Secondary | ICD-10-CM | POA: Diagnosis present

## 2014-01-13 DIAGNOSIS — Z7982 Long term (current) use of aspirin: Secondary | ICD-10-CM | POA: Diagnosis not present

## 2014-01-13 DIAGNOSIS — K219 Gastro-esophageal reflux disease without esophagitis: Secondary | ICD-10-CM | POA: Insufficient documentation

## 2014-01-13 DIAGNOSIS — I2510 Atherosclerotic heart disease of native coronary artery without angina pectoris: Secondary | ICD-10-CM | POA: Diagnosis not present

## 2014-01-13 LAB — CBC WITH DIFFERENTIAL/PLATELET
BASOS PCT: 0 % (ref 0–1)
Basophils Absolute: 0 10*3/uL (ref 0.0–0.1)
EOS ABS: 0.1 10*3/uL (ref 0.0–0.7)
EOS PCT: 0 % (ref 0–5)
HEMATOCRIT: 41.5 % (ref 36.0–46.0)
Hemoglobin: 14.5 g/dL (ref 12.0–15.0)
Lymphocytes Relative: 5 % — ABNORMAL LOW (ref 12–46)
Lymphs Abs: 0.8 10*3/uL (ref 0.7–4.0)
MCH: 31.9 pg (ref 26.0–34.0)
MCHC: 34.9 g/dL (ref 30.0–36.0)
MCV: 91.2 fL (ref 78.0–100.0)
Monocytes Absolute: 1.6 10*3/uL — ABNORMAL HIGH (ref 0.1–1.0)
Monocytes Relative: 11 % (ref 3–12)
Neutro Abs: 12.6 10*3/uL — ABNORMAL HIGH (ref 1.7–7.7)
Neutrophils Relative %: 84 % — ABNORMAL HIGH (ref 43–77)
Platelets: 387 10*3/uL (ref 150–400)
RBC: 4.55 MIL/uL (ref 3.87–5.11)
RDW: 13.5 % (ref 11.5–15.5)
WBC: 15.1 10*3/uL — AB (ref 4.0–10.5)

## 2014-01-13 LAB — BASIC METABOLIC PANEL
Anion gap: 16 — ABNORMAL HIGH (ref 5–15)
BUN: 11 mg/dL (ref 6–23)
CALCIUM: 10.3 mg/dL (ref 8.4–10.5)
CO2: 20 mEq/L (ref 19–32)
Chloride: 101 mEq/L (ref 96–112)
Creatinine, Ser: 0.79 mg/dL (ref 0.50–1.10)
GFR, EST AFRICAN AMERICAN: 84 mL/min — AB (ref >90)
GFR, EST NON AFRICAN AMERICAN: 73 mL/min — AB (ref >90)
GLUCOSE: 126 mg/dL — AB (ref 70–99)
POTASSIUM: 3.7 meq/L (ref 3.7–5.3)
SODIUM: 137 meq/L (ref 137–147)

## 2014-01-13 LAB — I-STAT TROPONIN, ED: Troponin i, poc: 0 ng/mL (ref 0.00–0.08)

## 2014-01-13 MED ORDER — IOHEXOL 350 MG/ML SOLN
100.0000 mL | Freq: Once | INTRAVENOUS | Status: AC | PRN
  Administered 2014-01-13: 100 mL via INTRAVENOUS

## 2014-01-13 MED ORDER — TRAMADOL HCL 50 MG PO TABS
50.0000 mg | ORAL_TABLET | Freq: Once | ORAL | Status: AC
  Administered 2014-01-13: 50 mg via ORAL
  Filled 2014-01-13: qty 1

## 2014-01-13 MED ORDER — LEVOFLOXACIN 500 MG PO TABS
750.0000 mg | ORAL_TABLET | Freq: Once | ORAL | Status: AC
Start: 2014-01-14 — End: 2014-01-14
  Administered 2014-01-14: 750 mg via ORAL
  Filled 2014-01-13: qty 2

## 2014-01-13 MED ORDER — ASPIRIN 81 MG PO CHEW
324.0000 mg | CHEWABLE_TABLET | Freq: Once | ORAL | Status: AC
  Administered 2014-01-13: 324 mg via ORAL
  Filled 2014-01-13: qty 4

## 2014-01-13 MED ORDER — TRAMADOL HCL 50 MG PO TABS: 50.0000 mg | ORAL_TABLET | Freq: Four times a day (QID) | ORAL | Status: DC | PRN

## 2014-01-13 MED ORDER — LEVOFLOXACIN 750 MG PO TABS: 750.0000 mg | ORAL_TABLET | Freq: Every day | ORAL | Status: DC

## 2014-01-13 NOTE — ED Notes (Signed)
Pt medicated for pain, HR significantly lower at this time, pt states she if feeling better, pt continues to be on CCM and 2 L 02 by [place].

## 2014-01-13 NOTE — ED Notes (Addendum)
Pt presents with c/o upper back that radiates down to her left shoulder. Pt reports that the pain started on Tuesday of this week and had gotten better but today the pain was worse. Pt denies injury to that area. Pt denies any chest pain but does admit to some shortness of breath that she has all of the time. Pt reports she is unsure as to why she feels the shortness of breath all of the time, was sent to the cardiologist and results came back normal per patient.

## 2014-01-13 NOTE — ED Provider Notes (Signed)
CSN: [id number]     Arrival date & time 01/13/14  2017 History   First MD Initiated Contact with Patient 01/13/14 2029     Chief Complaint  Patient presents with  . Back Pain  . Arm Pain     (Consider location/radiation/quality/duration/timing/severity/associated sxs/prior Treatment) HPI Comments: 80 year old female, no history of cardiac disease who presents with a complaint of left upper back pain radiating into her left shoulder. This started on Tuesday, was nonexertional, has been persistent but became acutely worse today. She denies any injuries and has not had any chest pain but does have some shortness of breath which she reports is a chronic problem for her. She denies any swelling in her lower extremities and has had no fever or coughing. The symptoms are persistent, severe, worse with change of position and movement of her left arm.  Patient is a 80 y.o. female presenting with back pain and arm pain. The history is provided by the patient and a friend.  Back Pain Arm Pain    Past Medical History  Diagnosis Date  . Asthma   . GERD (gastroesophageal reflux disease)   . Hypertension   . Osteoporosis   . TIA (transient ischemic attack)   . Hyperlipemia   . Cervical disc disease   . Anemia   . Depression    Past Surgical History  Procedure Laterality Date  . Appendectomy    . General hysterectomy    . Tonsillectomy    . Cholecystectomy     Family History  Problem Relation Age of Onset  . Pancreatic cancer Father 47    deceased  . Heart failure Mother 104    deceased  . Leukemia Brother 47    deceased  . Diabetes type II    . Alcohol abuse     History  Substance Use Topics  . Smoking status: Never Smoker   . Smokeless tobacco: Never Used  . Alcohol Use: No   OB History   Grav Para Term Preterm Abortions TAB SAB Ect Mult Living                 Review of Systems  Musculoskeletal: Positive for back pain.  All other systems reviewed and are  negative.     Allergies  Amoxicillin; Oxycodone; Penicillins; and Sulfamethoxazole  Home Medications   Prior to Admission medications   Medication Sig Start Date End Date Taking? Authorizing Provider  amLODipine (NORVASC) 5 MG tablet Take 5 mg by mouth daily.   Yes Historical Provider, MD  Ascorbic Acid (VITAMIN C) 500 MG tablet Take 500 mg by mouth daily.     Yes Historical Provider, MD  aspirin 81 MG chewable tablet Chew 81 mg by mouth daily.     Yes Historical Provider, MD  losartan (COZAAR) 100 MG tablet Take 100 mg by mouth daily.   Yes Historical Provider, MD  meloxicam (MOBIC) 15 MG tablet Take 1 tablet (15 mg total) by mouth daily. 05/09/13  Yes Bruce Kendall Flack, MD  Mirabegron ER (MYRBETRIQ) 25 MG TB24 Take 25 tablets by mouth daily. Prescribed by urology    Yes Historical Provider, MD  pantoprazole (PROTONIX) 40 MG tablet Take 1 tablet (40 mg total) by mouth daily. Minimum of 8 weeks 11/13/13  Yes Marin Olp, MD  pyridOXINE (VITAMIN B-6) 100 MG tablet Take 100 mg by mouth daily.   Yes Historical Provider, MD  vitamin E (VITAMIN E) 400 UNIT capsule Take 400 Units by mouth daily.  Yes Historical Provider, MD  levofloxacin (LEVAQUIN) 750 MG tablet Take 1 tablet (750 mg total) by mouth daily. 01/13/14   Johnna Acosta, MD  traMADol (ULTRAM) 50 MG tablet Take 1 tablet (50 mg total) by mouth every 6 (six) hours as needed. 01/13/14   Johnna Acosta, MD   BP 153/72  Pulse 89  Resp 28  SpO2 98% Physical Exam  Nursing note and vitals reviewed. Constitutional: She appears well-developed and well-nourished.  Uncomfortable appearing  HENT:  Head: Normocephalic and atraumatic.  Mouth/Throat: Oropharynx is clear and moist. No oropharyngeal exudate.  Eyes: Conjunctivae and EOM are normal. Pupils are equal, round, and reactive to light. Right eye exhibits no discharge. Left eye exhibits no discharge. No scleral icterus.  Neck: Normal range of motion. Neck supple. No JVD present. No  thyromegaly present.  Cardiovascular: Regular rhythm, normal heart sounds and intact distal pulses.  Exam reveals no gallop and no friction rub.   No murmur heard. Tachycardic to 145 beats a minute, strong pulses, no JVD, no peripheral edema  Pulmonary/Chest: Effort normal and breath sounds normal. No respiratory distress. She has no wheezes. She has no rales.  Abdominal: Soft. Bowel sounds are normal. She exhibits no distension and no mass. There is no tenderness.  Musculoskeletal: Normal range of motion. She exhibits tenderness ( ttp in the LUB an L shoulder - not reproducible though). She exhibits no edema.  Lymphadenopathy:    She has no cervical adenopathy.  Neurological: She is alert. Coordination normal.  Skin: Skin is warm and dry. No rash noted. No erythema.  Psychiatric: She has a normal mood and affect. Her behavior is normal.    ED Course  Procedures (including critical care time) Labs Review Labs Reviewed  CBC WITH DIFFERENTIAL - Abnormal; Notable for the following:    WBC 15.1 (*)    Neutrophils Relative % 84 (*)    Neutro Abs 12.6 (*)    Lymphocytes Relative 5 (*)    Monocytes Absolute 1.6 (*)    All other components within normal limits  BASIC METABOLIC PANEL - Abnormal; Notable for the following:    Glucose, Bld 126 (*)    GFR calc non Af Amer 73 (*)    GFR calc Af Amer 84 (*)    Anion gap 16 (*)    All other components within normal limits  I-STAT TROPOININ, ED    Imaging Review Ct Angio Chest Pe W/cm &/or Wo Cm  01/13/2014   CLINICAL DATA:  80 year old female with upper back pain radiating down left shoulder. Pain began few days ago, initially better, but worsened today. Evaluate for aortic dissection.  EXAM: CT ANGIOGRAPHY CHEST WITH CONTRAST  TECHNIQUE: Multidetector CT imaging of the chest was performed using the standard protocol during bolus administration of intravenous contrast. Multiplanar CT image reconstructions and MIPs were obtained to evaluate the  vascular anatomy.  CONTRAST:  145mL OMNIPAQUE IOHEXOL 350 MG/ML SOLN  COMPARISON:  Prior radiograph performed earlier the same day.  FINDINGS: Thyroid gland within normal limits. No pathologically enlarged mediastinal, hilar, or axillary lymph nodes are identified.  Intrathoracic aorta is of normal caliber and appearance. No evidence of aortic dissection. Aberrant right subclavian artery noted. Moderate atheromatous disease present within the aortic arch and origin of the great vessels.  Moderate cardiomegaly present with scattered calcified atheromatous disease within the left anterior descending coronary artery. No pericardial effusion.  Pulmonary arteries grossly within normal limits, although this study is not optimized further evaluation. No  central pulmonary embolus.  Upper lobe predominant emphysematous changes present. There are scattered tree-in-bud nodular opacities within the peripheral right upper lobe (series 15, image 40), suggestive of possible endobronchial/atypical infection. No other focal infiltrates identified. No pulmonary edema or pleural effusion. No pneumothorax.  Visualized portions of the upper abdomen are within normal limits.  No acute osseous abnormality. No worrisome lytic or blastic osseous lesions.  IMPRESSION: 1. No evidence for aortic dissection identified. 2. Nodular tree-in-bud opacities within the peripheral right upper lobe, which may reflect atypical/endobronchial infection. 3. Emphysema.   Electronically Signed   By: Jeannine Boga M.D.   On: 01/13/2014 23:47   Dg Chest Port 1 View  01/13/2014   CLINICAL DATA:  Shortness of breath and upper back pain radiating to the left shoulder.  EXAM: PORTABLE CHEST - 1 VIEW  COMPARISON:  09/28/2012  FINDINGS: Normal heart size and pulmonary vascularity. Calcified and tortuous aorta. Emphysematous changes in the lungs with diffuse interstitial change consistent with chronic bronchitis. Calcified granulomas in the left hilum. No  focal airspace disease or consolidation. No blunting of costophrenic angles. No pneumothorax. Degenerative changes in the spine and shoulders.  IMPRESSION: Emphysematous and chronic bronchitic changes in the lungs. No evidence of active pulmonary disease.   Electronically Signed   By: Lucienne Capers M.D.   On: 01/13/2014 21:07     EKG Interpretation   Date/Time:  Saturday January 13 2014 22:19:38 EDT Ventricular Rate:  88 PR Interval:  169 QRS Duration: 95 QT Interval:  365 QTC Calculation: 442 R Axis:   86 Text Interpretation:  Sinus rhythm Probable left atrial enlargement  Borderline right axis deviation Since last tracing rate slower and ST  segemnts improved in lateral precordial leads Abnormal ekg Confirmed by  Emmylou Bieker  MD, [place] ([id number]) on 01/13/2014 10:25:34 PM      MDM   Final diagnoses:  CAP (community acquired pneumonia)  Shoulder pain, acute, left    The etiology of the patient's symptoms is unclear though I would consider cardiac disease, pulmonary embolism, muscle strain, pneumonia, pneumothorax.  Chest xray is without acute findings, troponin negativem aking it unlikely that this pain is cardiac in nature as the patient has had constasnt pain in her LUB and shoulder for > 3 days - it is worsening today making it necessary to evaluate for more serious cause such as dissection - I have reexamined the pt and her pulse has come down very well.  It is now 40, her ECG is below.  Her pain is persistent and she states that when I manipulate her shoudler and upper back it reproduces the pain.  ED ECG REPORT  I personally interpreted this EKG   Date: 01/13/2014   Rate: 88  Rhythm: normal sinus rhythm  QRS Axis: normal  Intervals: normal  ST/T Wave abnormalities: improved compared with prior ECG  Conduction Disutrbances:none  Narrative Interpretation:   Old EKG Reviewed: changes noted  CT scan shows possible RUL PNA - pt informed, stable for d/c.  Pulse of 90, no hypoxia,  pain improved with ultram.  Trop neg, CT neg for dissection or PE.  Meds given in ED:  Medications  levofloxacin (LEVAQUIN) tablet 750 mg (not administered)  aspirin chewable tablet 324 mg (324 mg Oral Given 01/13/14 2059)  traMADol (ULTRAM) tablet 50 mg (50 mg Oral Given 01/13/14 2231)  iohexol (OMNIPAQUE) 350 MG/ML injection 100 mL (100 mLs Intravenous Contrast Given 01/13/14 2253)    New Prescriptions   LEVOFLOXACIN (LEVAQUIN) 750 MG  TABLET    Take 1 tablet (750 mg total) by mouth daily.   TRAMADOL (ULTRAM) 50 MG TABLET    Take 1 tablet (50 mg total) by mouth every 6 (six) hours as needed.       Johnna Acosta, MD 01/13/14 [phone]

## 2014-01-13 NOTE — ED Notes (Signed)
CXR at bedside

## 2014-01-13 NOTE — Discharge Instructions (Signed)
Please call your doctor for a followup appointment within 24-48 hours. When you talk to your doctor please let them know that you were seen in the emergency department and have them acquire all of your records so that they can discuss the findings with you and formulate a treatment plan to fully care for your new and ongoing problems. ° °

## 2014-01-14 DIAGNOSIS — M25512 Pain in left shoulder: Secondary | ICD-10-CM | POA: Diagnosis not present

## 2014-01-17 ENCOUNTER — Telehealth: Payer: Self-pay | Admitting: Family Medicine

## 2014-01-17 NOTE — Telephone Encounter (Signed)
Correction nancy is aware her mom appt is tomorrow at 10:30am

## 2014-01-17 NOTE — Telephone Encounter (Signed)
Pt daughter nancy would like a call back from [place] after her mom appt today at 10:30am. Daughter just would like result of today visit

## 2014-01-18 ENCOUNTER — Encounter: Payer: Self-pay | Admitting: Family Medicine

## 2014-01-18 ENCOUNTER — Ambulatory Visit (INDEPENDENT_AMBULATORY_CARE_PROVIDER_SITE_OTHER): Payer: Medicare Other | Admitting: Family Medicine

## 2014-01-18 VITALS — BP 110/60 | HR 88 | Temp 97.9°F | Wt 144.0 lb

## 2014-01-18 DIAGNOSIS — M25512 Pain in left shoulder: Secondary | ICD-10-CM

## 2014-01-18 DIAGNOSIS — J189 Pneumonia, unspecified organism: Secondary | ICD-10-CM | POA: Diagnosis not present

## 2014-01-18 DIAGNOSIS — R29898 Other symptoms and signs involving the musculoskeletal system: Secondary | ICD-10-CM

## 2014-01-18 MED ORDER — PREDNISONE 20 MG PO TABS
20.0000 mg | ORAL_TABLET | Freq: Every day | ORAL | Status: DC
Start: 2014-01-18 — End: 2014-02-19

## 2014-01-18 MED ORDER — TRAMADOL HCL 50 MG PO TABS: 50.0000 mg | ORAL_TABLET | Freq: Four times a day (QID) | ORAL | Status: DC | PRN

## 2014-01-18 NOTE — Telephone Encounter (Signed)
Please inform daughter that I am not sure pneumonia was causing left shoulder pain. Her breathing is better per her report so pneumonia could have been playing a role in symptoms.   I placed her on a short course of prednisone to see if we could calm down potential inflammation in left shoulder or neck as its possible this is cause of pain. She has ortho follow up on the 12th.

## 2014-01-18 NOTE — Progress Notes (Signed)
Garret Reddish, MD Phone: [phone]  Subjective:   Brandy Crawford is a 80 y.o. year old very pleasant female patient who presents with the following:  ED follow up PNA L shoulder pain  Review of Records: patient seen in ED on 01/13/14. Diagnosed with pneumonia by CT scan. MI ruled out with EKG and negative troponin 3 days after beginning of left shoulder pain which was presenting symptom. WBC was elevated at that time to 15.1 up from 1 month ago of 12.6. Patient's PNA was atypical in appearance and noted in right shoulder (not on left where she has pain).   Today patient reports her shoulder pain continues but breathing has improved. She has 2 more days of antibiotics. Pain since last Tuesday. Patient living at friend's home. She states the pain is constant about 5/10. She has trouble lifting her arm due to pain but when her arm is lifted, pain does not particularly increase. Patient has an appointment with Dr. French Ana on 10/12 at 10:45 AM. Patient is supposed to start PT but has not yet. Patient denies injury or history or rotator cuff issues.   ROS- no fever, chills, no chest pain or shortness of breath, no cough/congestion.   Past Medical History- Patient Active Problem List   Diagnosis Date Noted  . Overactive bladder 11/29/2013    Priority: Medium  . Abdominal pain, epigastric. Likely due to duodenal ulcer.  11/13/2013    Priority: Medium  . Abnormality of gait 04/28/2010    Priority: Medium  . HYPERLIPIDEMIA 09/20/2006    Priority: Medium  . HYPERTENSION 09/20/2006    Priority: Medium  . OSTEOPOROSIS 09/20/2006    Priority: Medium  . TRANSIENT ISCHEMIC ATTACK, HX OF 09/20/2006    Priority: Medium  . Acute bacterial bronchitis ? 10/27/2012    Priority: Low  . DEGENERATIVE DISC DISEASE 10/28/2009    Priority: Low  . SCOLIOSIS 10/28/2009    Priority: Low  . ASTHMA 09/20/2006    Priority: Low  . GERD 09/20/2006    Priority: Low   Medications- reviewed and  updated Current Outpatient Prescriptions  Medication Sig Dispense Refill  . amLODipine (NORVASC) 5 MG tablet Take 5 mg by mouth daily.      . Ascorbic Acid (VITAMIN C) 500 MG tablet Take 500 mg by mouth daily.        Marland Kitchen aspirin 81 MG chewable tablet Chew 81 mg by mouth daily.        Marland Kitchen levofloxacin (LEVAQUIN) 750 MG tablet Take 1 tablet (750 mg total) by mouth daily.  7 tablet  0  . losartan (COZAAR) 100 MG tablet Take 100 mg by mouth daily.      . Mirabegron ER (MYRBETRIQ) 25 MG TB24 Take 25 tablets by mouth daily. Prescribed by urology       . pantoprazole (PROTONIX) 40 MG tablet Take 1 tablet (40 mg total) by mouth daily. Minimum of 8 weeks  30 tablet  1  . pyridOXINE (VITAMIN B-6) 100 MG tablet Take 100 mg by mouth daily.      . vitamin E (VITAMIN E) 400 UNIT capsule Take 400 Units by mouth daily.      . predniSONE (DELTASONE) 20 MG tablet Take 1 tablet (20 mg total) by mouth daily with breakfast.  7 tablet  0  . traMADol (ULTRAM) 50 MG tablet Take 1 tablet (50 mg total) by mouth every 6 (six) hours as needed.  30 tablet  0   No current facility-administered medications for this  visit.    Objective: BP 110/60  Pulse 88  Temp(Src) 97.9 F (36.6 C)  Wt 144 lb (65.318 kg) Gen: NAD, resting comfortably in chair Neck: negative spurling test CV: RRR no murmurs rubs or gallops Lungs: CTAB no crackles, wheeze, rhonchi  Shoulder: Inspection reveals no abnormalities, atrophy or asymmetry. Palpation is normal with no tenderness over AC joint or bicipital groove. ROM is full in all planes with passive ROM but patient cannot lift left shoulder at all.  Can put hand behind back and push off.  Patient cannot participate in empty can.  Drop arm sign noted.   Skin: warm, dry, no rash Neuro: 4+/5 strength bilaterally with bicep and triceps. 5/5 grip strength bilaterally. Weakness is focal to shoulder alone. CN II-XII normal including shoulder shrug. 2+ reflexes at biceps. Normal intact distal  sensation.   Assessment/Plan:  Pneumonia Atypical. Patient has TB skin test being processed at [facility] assisted living-asked them to send [place] records. See patient back in 1 month and consider repeat CXR and CBC.   Left shoulder pain/weakness Focal to shoulder so doubt CVA. No role for CT/MRI at this time of head. No mechanism of injury for tear but I guess this is possible and complete tear could explain weakness as well as pain. Could also be DJD in the neck though would suspect paresthesias or weakness outside of simply shoulder. We will trial 7 days of prednisone 20mg (doubt TB so ok with prednisone). Patient has visit with ortho 10/12 and I would ask for copy of these results . Patient also is to start working with PT. Refilled tramadol which is helping patient.   Return precautions advised.   Meds ordered this encounter  Medications  . traMADol (ULTRAM) 50 MG tablet    Sig: Take 1 tablet (50 mg total) by mouth every 6 (six) hours as needed.    Dispense:  30 tablet    Refill:  0  . predniSONE (DELTASONE) 20 MG tablet    Sig: Take 1 tablet (20 mg total) by mouth daily with breakfast.    Dispense:  7 tablet    Refill:  0

## 2014-01-18 NOTE — Telephone Encounter (Signed)
Pt daughter email is [email or web address], she states you can email her if needed

## 2014-01-18 NOTE — Telephone Encounter (Signed)
Daughter is leaving a preferred daytime number  [phone]. Returning dr Retail banker call.

## 2014-01-18 NOTE — Telephone Encounter (Signed)
What would you like for me to tell her daughter Dr. Yong Channel?

## 2014-01-18 NOTE — Patient Instructions (Addendum)
See me in 1 month-may repeat CXR and blood counts. See me sooner if symptoms were to worsen or if you had new symptoms.   Take tramadol for pain. Take prednisone for 7 days to see if helps with shoulder pain. Follow up with orthopedics.

## 2014-01-18 NOTE — Telephone Encounter (Signed)
Recommended patient come in for visit for dementia evaluation with daughter at her side. I agree most likely beneficial to do MMSE and consider workup for reversible causes of dementia. From my few visits with patient who is not the best historian-I agree most likely beneficial to submit a request for reevaluation by the DMV. Also agree patient likely would benefit from staying in assisted living.   [email or web address]

## 2014-01-18 NOTE — Telephone Encounter (Signed)
Pt daughter states she is concerned about what level of care she has in the home that she is in, and she is concerned about her mom driving by herself since she has slow reaction time and limited movement in her neck and she is not always coherant. Daughter stays in [place] and is only to make appointments with pt on Mondays. Pt can be reached at (W) [phone] from 10a-6p and (C) [phone] after 6pm

## 2014-01-22 DIAGNOSIS — M25512 Pain in left shoulder: Secondary | ICD-10-CM | POA: Diagnosis not present

## 2014-01-29 DIAGNOSIS — Z23 Encounter for immunization: Secondary | ICD-10-CM | POA: Diagnosis not present

## 2014-02-01 DIAGNOSIS — M25512 Pain in left shoulder: Secondary | ICD-10-CM | POA: Diagnosis not present

## 2014-02-13 DIAGNOSIS — M19012 Primary osteoarthritis, left shoulder: Secondary | ICD-10-CM | POA: Diagnosis not present

## 2014-02-19 ENCOUNTER — Encounter: Payer: Self-pay | Admitting: Family Medicine

## 2014-02-19 ENCOUNTER — Ambulatory Visit (INDEPENDENT_AMBULATORY_CARE_PROVIDER_SITE_OTHER): Payer: Medicare Other | Admitting: Family Medicine

## 2014-02-19 VITALS — BP 120/58 | Temp 98.6°F | Wt 147.0 lb

## 2014-02-19 DIAGNOSIS — M75102 Unspecified rotator cuff tear or rupture of left shoulder, not specified as traumatic: Secondary | ICD-10-CM | POA: Insufficient documentation

## 2014-02-19 DIAGNOSIS — S46812D Strain of other muscles, fascia and tendons at shoulder and upper arm level, left arm, subsequent encounter: Secondary | ICD-10-CM | POA: Diagnosis not present

## 2014-02-19 DIAGNOSIS — M19012 Primary osteoarthritis, left shoulder: Secondary | ICD-10-CM | POA: Diagnosis not present

## 2014-02-19 DIAGNOSIS — M25512 Pain in left shoulder: Secondary | ICD-10-CM | POA: Diagnosis not present

## 2014-02-19 DIAGNOSIS — IMO0001 Reserved for inherently not codable concepts without codable children: Secondary | ICD-10-CM

## 2014-02-19 DIAGNOSIS — J4520 Mild intermittent asthma, uncomplicated: Secondary | ICD-10-CM

## 2014-02-19 DIAGNOSIS — M75100 Unspecified rotator cuff tear or rupture of unspecified shoulder, not specified as traumatic: Secondary | ICD-10-CM

## 2014-02-19 MED ORDER — ALBUTEROL SULFATE HFA 108 (90 BASE) MCG/ACT IN AERS: 2.0000 | INHALATION_SPRAY | Freq: Four times a day (QID) | RESPIRATORY_TRACT | Status: AC | PRN

## 2014-02-19 MED ORDER — TRAMADOL HCL 50 MG PO TABS: 50.0000 mg | ORAL_TABLET | Freq: Two times a day (BID) | ORAL | Status: DC | PRN

## 2014-02-19 NOTE — Assessment & Plan Note (Addendum)
Complete tear (likely not reconstructable per ortho). Requiring intermediate care at friends home with continued PT to rehab the arm. She will also require assistance with activities such as clothing at times, transfers, washing and bathing. For pain, continue  Scheduled tylenol. Add scheduled morning tramadol as well with a nighttime (dinnertime) dose available as needed-see rx.

## 2014-02-19 NOTE — Assessment & Plan Note (Signed)
Formerly on albuterol for asthma. We will restart this for prn use of shortness of breath. Previous cardiac workup was unremarkable. Could reconsider if needed.

## 2014-02-19 NOTE — Patient Instructions (Addendum)
See printed note  Health Maintenance Due  Topic Date Due  . INFLUENZA VACCINE - send [place] a copy 11/11/2013

## 2014-02-19 NOTE — Progress Notes (Signed)
Garret Reddish, MD Phone: [phone]  Subjective:   Brandy Crawford is a 80 y.o. year old very pleasant female patient who presents with the following:  [name] [phone] HCPOA.   Left Supraspinatus (rotator cuff-see MRI under objective)  full tear Therapeutic ultrasound very helpful.  Likely unreconstructable per orthos notes Dr. French Ana. Possible arthroplasty. Decision made not to have surgery by patient with aid of daughter. Cannot lift arm overhead. As a result, she needs assist with washing her hair, clothing at times, transfers.   Strongly doubt Pain diagnosed as PNA over a month ago was related to pneumonia but more likely this left shoulder.   ROS- no chest pain or fevers.   Asthma Does admit to occasional shortness of breath without chest pain. Used to use albuterol, no longer has available.  ROS- denies wheeze  Past Medical History- Patient Active Problem List   Diagnosis Date Noted  . Overactive bladder 11/29/2013    Priority: Medium  . Abdominal pain, epigastric. Likely due to duodenal ulcer.  11/13/2013    Priority: Medium  . Abnormality of gait 04/28/2010    Priority: Medium  . HYPERLIPIDEMIA 09/20/2006    Priority: Medium  . HYPERTENSION 09/20/2006    Priority: Medium  . OSTEOPOROSIS 09/20/2006    Priority: Medium  . TRANSIENT ISCHEMIC ATTACK, HX OF 09/20/2006    Priority: Medium  . Acute bacterial bronchitis ? 10/27/2012    Priority: Low  . DEGENERATIVE DISC DISEASE 10/28/2009    Priority: Low  . SCOLIOSIS 10/28/2009    Priority: Low  . Asthma 09/20/2006    Priority: Low  . GERD 09/20/2006    Priority: Low  . Tear of left supraspinatus tendon 02/19/2014   Medications- reviewed and updated Current Outpatient Prescriptions  Medication Sig Dispense Refill  . amLODipine (NORVASC) 5 MG tablet Take 5 mg by mouth daily.    . Ascorbic Acid (VITAMIN C) 500 MG tablet Take 500 mg by mouth daily.      Marland Kitchen aspirin 81 MG chewable tablet Chew 81 mg  by mouth daily.      Marland Kitchen levofloxacin (LEVAQUIN) 750 MG tablet Take 1 tablet (750 mg total) by mouth daily. 7 tablet 0  . losartan (COZAAR) 100 MG tablet Take 100 mg by mouth daily.    . Mirabegron ER (MYRBETRIQ) 25 MG TB24 Take 25 tablets by mouth daily. Prescribed by urology     . pantoprazole (PROTONIX) 40 MG tablet Take 1 tablet (40 mg total) by mouth daily. Minimum of 8 weeks 30 tablet 1  . pyridOXINE (VITAMIN B-6) 100 MG tablet Take 100 mg by mouth daily.    . vitamin E (VITAMIN E) 400 UNIT capsule Take 400 Units by mouth daily.    Marland Kitchen albuterol (PROVENTIL HFA;VENTOLIN HFA) 108 (90 BASE) MCG/ACT inhaler Inhale 2 puffs into the lungs every 6 (six) hours as needed for wheezing or shortness of breath. 1 Inhaler 3  . traMADol (ULTRAM) 50 MG tablet Take 1 tablet (50 mg total) by mouth 2 (two) times daily as needed for moderate pain (schedule morning dose with breakfast, evening dose prn). 60 tablet 2   No current facility-administered medications for this visit.    Objective: BP 120/58 mmHg  Temp(Src) 98.6 F (37 C)  Wt 147 lb (66.679 kg) Gen: NAD, resting comfortably in chair CV: RRR no murmurs rubs or gallops Lungs: CTAB no crackles, wheeze, rhonchi , nonlabored (not currently shortness of breath) Ext: 1+ edema unchanged from previous MSK: can move left arm  past forearm but cannot lift overhead Skin: warm, dry Neuro: grossly normal, moves all extremities except left arm overhead, walks with walker   Assessment/Plan:  Asthma Formerly on albuterol for asthma. We will restart this for prn use of shortness of breath. Previous cardiac workup was unremarkable. Could reconsider if needed.   Tear of left supraspinatus tendon Complete tear (likely not reconstructable per ortho). Requ[facility] care at [facility] with continued PT to rehab the arm. She will also require assistance with activities such as clothing at times, transfers, washing and bathing. For pain, continue  Scheduled  tylenol. Add scheduled morning tramadol as well with a nighttime (dinnertime) dose available as needed-see rx.    Follow up in 1 month to see how breathing is doing with as needed albuterol.   >50% of 25 minute office visit was spent on counseling (discussion about surgical options, needs for care)  and coordination of care (coordinating care at nursing home)  Meds ordered this encounter  Medications  . albuterol (PROVENTIL HFA;VENTOLIN HFA) 108 (90 BASE) MCG/ACT inhaler    Sig: Inhale 2 puffs into the lungs every 6 (six) hours as needed for wheezing or shortness of breath.    Dispense:  1 Inhaler    Refill:  3  . traMADol (ULTRAM) 50 MG tablet    Sig: Take 1 tablet (50 mg total) by mouth 2 (two) times daily as needed for moderate pain (schedule morning dose with breakfast, evening dose prn).    Dispense:  60 tablet    Refill:  2

## 2014-02-20 ENCOUNTER — Ambulatory Visit: Payer: Self-pay | Admitting: Family Medicine

## 2014-03-06 ENCOUNTER — Emergency Department (HOSPITAL_COMMUNITY)
Admission: EM | Admit: 2014-03-06 | Discharge: 2014-03-06 | Disposition: A | Discharge status: Home / Self Care | Payer: Medicare Other | Attending: Emergency Medicine | Admitting: Emergency Medicine

## 2014-03-06 ENCOUNTER — Telehealth: Payer: Self-pay | Admitting: *Deleted

## 2014-03-06 ENCOUNTER — Emergency Department (HOSPITAL_COMMUNITY): Payer: Medicare Other

## 2014-03-06 ENCOUNTER — Encounter (HOSPITAL_COMMUNITY): Payer: Self-pay

## 2014-03-06 DIAGNOSIS — Y9389 Activity, other specified: Secondary | ICD-10-CM | POA: Diagnosis not present

## 2014-03-06 DIAGNOSIS — Z8639 Personal history of other endocrine, nutritional and metabolic disease: Secondary | ICD-10-CM | POA: Diagnosis not present

## 2014-03-06 DIAGNOSIS — T182XXA Foreign body in stomach, initial encounter: Secondary | ICD-10-CM | POA: Diagnosis not present

## 2014-03-06 DIAGNOSIS — Z792 Long term (current) use of antibiotics: Secondary | ICD-10-CM | POA: Insufficient documentation

## 2014-03-06 DIAGNOSIS — Y998 Other external cause status: Secondary | ICD-10-CM | POA: Insufficient documentation

## 2014-03-06 DIAGNOSIS — Z8739 Personal history of other diseases of the musculoskeletal system and connective tissue: Secondary | ICD-10-CM | POA: Diagnosis not present

## 2014-03-06 DIAGNOSIS — T17398A Other foreign object in larynx causing other injury, initial encounter: Secondary | ICD-10-CM | POA: Diagnosis not present

## 2014-03-06 DIAGNOSIS — D649 Anemia, unspecified: Secondary | ICD-10-CM | POA: Diagnosis not present

## 2014-03-06 DIAGNOSIS — K219 Gastro-esophageal reflux disease without esophagitis: Secondary | ICD-10-CM | POA: Diagnosis not present

## 2014-03-06 DIAGNOSIS — Z7982 Long term (current) use of aspirin: Secondary | ICD-10-CM | POA: Insufficient documentation

## 2014-03-06 DIAGNOSIS — I10 Essential (primary) hypertension: Secondary | ICD-10-CM | POA: Insufficient documentation

## 2014-03-06 DIAGNOSIS — Z8673 Personal history of transient ischemic attack (TIA), and cerebral infarction without residual deficits: Secondary | ICD-10-CM | POA: Diagnosis not present

## 2014-03-06 DIAGNOSIS — Y9289 Other specified places as the place of occurrence of the external cause: Secondary | ICD-10-CM | POA: Diagnosis not present

## 2014-03-06 DIAGNOSIS — T189XXA Foreign body of alimentary tract, part unspecified, initial encounter: Secondary | ICD-10-CM

## 2014-03-06 DIAGNOSIS — Z880 Allergy status to penicillin: Secondary | ICD-10-CM | POA: Insufficient documentation

## 2014-03-06 DIAGNOSIS — Z79899 Other long term (current) drug therapy: Secondary | ICD-10-CM | POA: Diagnosis not present

## 2014-03-06 DIAGNOSIS — J45909 Unspecified asthma, uncomplicated: Secondary | ICD-10-CM | POA: Diagnosis not present

## 2014-03-06 DIAGNOSIS — X58XXXA Exposure to other specified factors, initial encounter: Secondary | ICD-10-CM | POA: Diagnosis not present

## 2014-03-06 DIAGNOSIS — F329 Major depressive disorder, single episode, unspecified: Secondary | ICD-10-CM | POA: Insufficient documentation

## 2014-03-06 NOTE — ED Notes (Signed)
Per EMS- Patient was at the dentist today and while clamp was on a crown the dentis dropped a "stud" that was suppose to be for her crown and the patient swallowed it, Patient denies any problems breathing or swallowing.

## 2014-03-06 NOTE — ED Notes (Signed)
No problems swallowing.

## 2014-03-06 NOTE — Telephone Encounter (Signed)
FYI--Robin called from Dr Nani Gasser office  (dentist-ph#[phone])  to let Dr Yong Channel know the pt was seen today, swallowed a dental wrench and they are sending her to the ER. She is a resident of [facility].

## 2014-03-06 NOTE — ED Provider Notes (Signed)
CSN: [id number]     Arrival date & time 03/06/14  1516 History   First MD Initiated Contact with Patient 03/06/14 1521     Chief Complaint  Patient presents with  . Swallowed Foreign Body     (Consider location/radiation/quality/duration/timing/severity/associated sxs/prior Treatment) Patient is a 80 y.o. female presenting with foreign body swallowed.  Swallowed Foreign Body  Patient was at the dentist today and she states she swallowed a piece of metal during the procedure around 3 pm. She states she felt it go down. She did not choke. She is having no respiratory distress or cough. She denies any symptoms. She states the dentist wants to know today if she swallowed the piece of metal into her GI tract.  PCP Dr Allen Norris  Past Medical History  Diagnosis Date  . Asthma   . GERD (gastroesophageal reflux disease)   . Hypertension   . Osteoporosis   . TIA (transient ischemic attack)   . Hyperlipemia   . Cervical disc disease   . Anemia   . Depression    Past Surgical History  Procedure Laterality Date  . Appendectomy    . General hysterectomy    . Tonsillectomy    . Cholecystectomy     Family History  Problem Relation Age of Onset  . Pancreatic cancer Father 41    deceased  . Heart failure Mother 104    deceased  . Leukemia Brother 49    deceased  . Diabetes type II    . Alcohol abuse     History  Substance Use Topics  . Smoking status: Never Smoker   . Smokeless tobacco: Never Used  . Alcohol Use: No   Lives in ALF Uses a walker  OB History    No data available     Review of Systems  All other systems reviewed and are negative.     Allergies  Amoxicillin; Oxycodone; Sulfamethoxazole; and Penicillins  Home Medications   Prior to Admission medications   Medication Sig Start Date End Date Taking? Authorizing Provider  albuterol (PROVENTIL HFA;VENTOLIN HFA) 108 (90 BASE) MCG/ACT inhaler Inhale 2 puffs into the lungs every 6 (six) hours as  needed for wheezing or shortness of breath. 02/19/14   Marin Olp, MD  amLODipine (NORVASC) 5 MG tablet Take 5 mg by mouth daily.    Historical Provider, MD  Ascorbic Acid (VITAMIN C) 500 MG tablet Take 500 mg by mouth daily.      Historical Provider, MD  aspirin 81 MG chewable tablet Chew 81 mg by mouth daily.      Historical Provider, MD  levofloxacin (LEVAQUIN) 750 MG tablet Take 1 tablet (750 mg total) by mouth daily. 01/13/14   Johnna Acosta, MD  losartan (COZAAR) 100 MG tablet Take 100 mg by mouth daily.    Historical Provider, MD  Mirabegron ER (MYRBETRIQ) 25 MG TB24 Take 25 tablets by mouth daily. Prescribed by urology     Historical Provider, MD  pantoprazole (PROTONIX) 40 MG tablet Take 1 tablet (40 mg total) by mouth daily. Minimum of 8 weeks 11/13/13   Marin Olp, MD  pyridOXINE (VITAMIN B-6) 100 MG tablet Take 100 mg by mouth daily.    Historical Provider, MD  traMADol (ULTRAM) 50 MG tablet Take 1 tablet (50 mg total) by mouth 2 (two) times daily as needed for moderate pain (schedule morning dose with breakfast, evening dose prn). 02/19/14   Marin Olp, MD  vitamin E (VITAMIN E) 400  UNIT capsule Take 400 Units by mouth daily.    Historical Provider, MD   BP 164/102 mmHg  Pulse 89  Temp(Src) 98.2 F (36.8 C) (Oral)  Resp 18  SpO2 98%  Vital signs normal except hypertension  Physical Exam  Constitutional: She is oriented to person, place, and time. She appears well-developed and well-nourished.  Non-toxic appearance. She does not appear ill. No distress.  HENT:  Head: Normocephalic and atraumatic.  Right Ear: External ear normal.  Left Ear: External ear normal.  Nose: Nose normal. No mucosal edema or rhinorrhea.  Mouth/Throat: Oropharynx is clear and moist and mucous membranes are normal. No dental abscesses or uvula swelling.  No oralpharyngeal fb seen  Eyes: Conjunctivae and EOM are normal. Pupils are equal, round, and reactive to light.  Neck: Normal range  of motion and full passive range of motion without pain. Neck supple.  Cardiovascular: Normal rate, regular rhythm and normal heart sounds.  Exam reveals no gallop and no friction rub.   No murmur heard. Pulmonary/Chest: Effort normal and breath sounds normal. No respiratory distress. She has no wheezes. She has no rhonchi. She has no rales. She exhibits no tenderness and no crepitus.  Abdominal: Soft. Normal appearance and bowel sounds are normal. She exhibits no distension. There is no tenderness. There is no rebound and no guarding.  Musculoskeletal: Normal range of motion. She exhibits no edema or tenderness.  Moves all extremities well.   Neurological: She is alert and oriented to person, place, and time. She has normal strength. No cranial nerve deficit.  Skin: Skin is warm, dry and intact. No rash noted. No erythema. No pallor.  Psychiatric: She has a normal mood and affect. Her speech is normal and behavior is normal. Her mood appears not anxious.  Nursing note and vitals reviewed.   ED Course  Procedures (including critical care time)  Pt has a metal fb in her stomach  when I review her KUB   Labs Review Labs Reviewed - No data to display  Imaging Review Dg Abd 1 View  03/06/2014   CLINICAL DATA:  Patient swallowed piece of metal while at dentist office  EXAM: ABDOMEN - 1 VIEW  COMPARISON:  None.  FINDINGS: There is a metallic foreign body in the region of the stomach. There are clips in the right upper quadrant. There is fairly diffuse stool throughout the colon. The overall bowel gas pattern is normal. No obstruction or free air is seen. There is degenerative change in the lumbar spine with dextroscoliosis.  IMPRESSION: Metallic foreign body in region of stomach. Overall bowel gas pattern unremarkable. Fairly diffuse stool throughout colon.   Electronically Signed   By: Lowella Grip M.D.   On: 03/06/2014 16:18     EKG Interpretation None      MDM   Final diagnoses:   Foreign body alimentary tract, initial encounter    Plan discharge  Rolland Porter, MD, Alanson Aly, MD 03/06/14 [phone]

## 2014-03-06 NOTE — Discharge Instructions (Signed)
The dental hardware is in your stomach. It should pass through your BM's in the next week. Recheck if you get a fever, abdominal pain, see blood in your BM's.

## 2014-03-07 NOTE — Telephone Encounter (Signed)
FYI Dr. Hunter  

## 2014-04-16 ENCOUNTER — Ambulatory Visit: Payer: Self-pay | Admitting: Family Medicine

## 2014-04-19 DIAGNOSIS — R633 Feeding difficulties: Secondary | ICD-10-CM | POA: Diagnosis not present

## 2014-05-03 ENCOUNTER — Encounter: Payer: Self-pay | Admitting: Family Medicine

## 2014-05-07 ENCOUNTER — Ambulatory Visit: Payer: Self-pay | Admitting: Family Medicine

## 2014-05-10 ENCOUNTER — Non-Acute Institutional Stay: Payer: Medicare Other | Admitting: Nurse Practitioner

## 2014-05-10 VITALS — BP 156/86 | HR 96 | Temp 98.7°F | Ht 65.0 in | Wt 145.0 lb

## 2014-05-10 DIAGNOSIS — J45909 Unspecified asthma, uncomplicated: Secondary | ICD-10-CM | POA: Diagnosis not present

## 2014-05-10 DIAGNOSIS — N3281 Overactive bladder: Secondary | ICD-10-CM | POA: Diagnosis not present

## 2014-05-10 DIAGNOSIS — Z8679 Personal history of other diseases of the circulatory system: Secondary | ICD-10-CM

## 2014-05-10 DIAGNOSIS — R1013 Epigastric pain: Secondary | ICD-10-CM

## 2014-05-10 DIAGNOSIS — I10 Essential (primary) hypertension: Secondary | ICD-10-CM

## 2014-05-10 DIAGNOSIS — M5137 Other intervertebral disc degeneration, lumbosacral region: Secondary | ICD-10-CM

## 2014-05-10 DIAGNOSIS — E785 Hyperlipidemia, unspecified: Secondary | ICD-10-CM

## 2014-05-10 DIAGNOSIS — K219 Gastro-esophageal reflux disease without esophagitis: Secondary | ICD-10-CM

## 2014-05-10 DIAGNOSIS — R269 Unspecified abnormalities of gait and mobility: Secondary | ICD-10-CM | POA: Diagnosis not present

## 2014-05-10 NOTE — Assessment & Plan Note (Signed)
Diet control, update lipid panel.

## 2014-05-10 NOTE — Assessment & Plan Note (Signed)
Per the patient: OA every joints, chronic lower back pain, small steps, ambulates with walker.

## 2014-05-10 NOTE — Progress Notes (Signed)
Patient ID: Brandy Crawford, female   DOB: 1924-02-07, 81 y.o.   MRN: [id number]   Code Status: Full code Living Will POA  Allergies  Allergen Reactions  . Amoxicillin     Per MAR  . Oxycodone Other (See Comments)    Per MAR   . Sulfamethoxazole     Per MAR   . Penicillins Rash    Chief Complaint  Patient presents with  . Medical Management of Chronic Issues    New Patient-switching doctors.  Lives in [place] since 01/15/14    HPI: Patient is a 81 y.o. female seen in the clinic at [facility] today for new patient establishment and other chronic medical conditions.  Problem List Items Addressed This Visit    Overactive bladder    Myrbetriq prescribed by urology.        Hyperlipidemia - Primary    Diet control, update lipid panel.       History of cardiovascular disorder    X 3 in 2000 now on aspirin.        GERD    Stable, takes Omeprazole 20mg  po daily. Update CBC      Essential hypertension    Controlled, takes Amlodipine 5mg , Losartan 100mg , update CMP and TSH      DDD (degenerative disc disease), lumbosacral    Including lumbar spondylosis. Regular f/u Dr. French Ana. Receives injections last 01/2013.  Tramadol 50mg  bid prn available to her      Asthma    Noted 2008. Patient states chronic bronchitis. No longer using.  Stable. Prn Ventolin 2 puffs q6h available to her.         Abnormality of gait    Per the patient: OA every joints, chronic lower back pain, small steps, ambulates with walker.       Abdominal pain, epigastric. Likely due to duodenal ulcer.     04/19/14 KUB 1cm rectangular metallic density in the left upper quadrant, compatible with foreign body. Update KUB. No c/o abd pain today.          Review of Systems:  Review of Systems  Constitutional: Negative for fever, chills, weight loss, malaise/fatigue and diaphoresis.  HENT: Positive for hearing loss. Negative for congestion, ear discharge, ear pain, nosebleeds, sore throat and  tinnitus.   Eyes: Negative for blurred vision, double vision, photophobia, pain, discharge and redness.  Respiratory: Negative for cough, hemoptysis, sputum production, shortness of breath, wheezing and stridor.   Cardiovascular: Negative for chest pain, palpitations, orthopnea, claudication, leg swelling and PND.  Gastrointestinal: Negative for heartburn, nausea, vomiting, abdominal pain, diarrhea, constipation, blood in stool and melena.  Genitourinary: Negative for dysuria, urgency, frequency, hematuria and flank pain.  Musculoskeletal: Positive for joint pain. Negative for myalgias, back pain and neck pain.       Ambulates with walker. Left shoulder limited ROM-chronic pain.   Skin: Negative for itching and rash.  Neurological: Negative for dizziness, tingling, tremors, sensory change, speech change, focal weakness, seizures, loss of consciousness, weakness and headaches.  Endo/Heme/Allergies: Negative for environmental allergies and polydipsia. Does not bruise/bleed easily.  Psychiatric/Behavioral: Positive for memory loss. Negative for depression, suicidal ideas, hallucinations and substance abuse. The patient is not nervous/anxious and does not have insomnia.      Past Medical History  Diagnosis Date  . Asthma   . GERD (gastroesophageal reflux disease)   . Hypertension   . Osteoporosis   . TIA (transient ischemic attack)   . Hyperlipemia   . Cervical disc disease   .  Anemia   . Depression    Past Surgical History  Procedure Laterality Date  . Appendectomy    . Abdominal hysterectomy    . Tonsillectomy    . Cholecystectomy     Social History:   reports that she has never smoked. She has never used smokeless tobacco. She reports that she does not drink alcohol or use illicit drugs.  Family History  Problem Relation Age of Onset  . Pancreatic cancer Father 60    deceased  . Heart failure Mother 104    deceased  . Leukemia Brother 65    deceased  . Diabetes type II      . Alcohol abuse      Medications: Patient's Medications  New Prescriptions   No medications on file  Previous Medications   ACETAMINOPHEN (TYLENOL) 325 MG TABLET    Take 650 mg by mouth 3 (three) times daily.   ALBUTEROL (PROVENTIL HFA;VENTOLIN HFA) 108 (90 BASE) MCG/ACT INHALER    Inhale 2 puffs into the lungs every 6 (six) hours as needed for wheezing or shortness of breath.   AMLODIPINE (NORVASC) 5 MG TABLET    Take 5 mg by mouth daily with breakfast.    ASCORBIC ACID (VITAMIN C) 500 MG TABLET    Take 500 mg by mouth daily with breakfast.    ASPIRIN 81 MG CHEWABLE TABLET    Chew 81 mg by mouth daily with breakfast.    LOSARTAN (COZAAR) 100 MG TABLET    Take 100 mg by mouth daily with breakfast.    MIRABEGRON ER (MYRBETRIQ) 25 MG TB24    Take 25 tablets by mouth daily with breakfast. Prescribed by urology   OMEPRAZOLE (PRILOSEC) 20 MG CAPSULE    Take 20 mg by mouth daily with breakfast.   PYRIDOXINE (VITAMIN B-6) 100 MG TABLET    Take 100 mg by mouth daily with breakfast.    TRAMADOL (ULTRAM) 50 MG TABLET    Take 1 tablet (50 mg total) by mouth 2 (two) times daily as needed for moderate pain (schedule morning dose with breakfast, evening dose prn).   VITAMIN E (VITAMIN E) 400 UNIT CAPSULE    Take 400 Units by mouth daily with breakfast.   Modified Medications   No medications on file  Discontinued Medications   LEVOFLOXACIN (LEVAQUIN) 750 MG TABLET    Take 1 tablet (750 mg total) by mouth daily.   LOPERAMIDE (IMODIUM) 2 MG CAPSULE    Take 4 mg by mouth every other day.   PANTOPRAZOLE (PROTONIX) 40 MG TABLET    Take 1 tablet (40 mg total) by mouth daily. Minimum of 8 weeks     Physical Exam: Physical Exam  Constitutional: She is oriented to person, place, and time. She appears well-developed and well-nourished. No distress.  HENT:  Head: Normocephalic and atraumatic.  Right Ear: External ear normal. No lacerations. No drainage. No foreign bodies. No mastoid tenderness. Tympanic  membrane is not injected, not scarred, not perforated, not erythematous, not retracted and not bulging. Tympanic membrane mobility is abnormal. No middle ear effusion. Decreased hearing is noted.  Left Ear: External ear normal. No lacerations. No drainage. No foreign bodies. No mastoid tenderness. Tympanic membrane is not injected, not scarred, not perforated, not erythematous, not retracted and not bulging. Tympanic membrane mobility is abnormal.  No middle ear effusion. Decreased hearing is noted.  Nose: Nose normal.  Mouth/Throat: Oropharynx is clear and moist. No oropharyngeal exudate.  Eyes: Conjunctivae and EOM are normal. Pupils  are equal, round, and reactive to light. Right eye exhibits no discharge. Left eye exhibits no discharge. No scleral icterus.  Neck: Normal range of motion. Neck supple. No JVD present. No tracheal deviation present. No thyromegaly present.  Cardiovascular: Normal rate, regular rhythm, normal heart sounds and intact distal pulses.  Exam reveals no gallop and no friction rub.   No murmur heard. Pulmonary/Chest: Effort normal and breath sounds normal. No stridor. No respiratory distress. She has no wheezes. She has no rales. She exhibits no tenderness.  Abdominal: Soft. Bowel sounds are normal. She exhibits no distension and no mass. There is no tenderness. There is no rebound and no guarding.  Genitourinary: Guaiac negative stool.  Musculoskeletal: She exhibits no edema or tenderness.  Lymphadenopathy:    She has no cervical adenopathy.  Neurological: She is alert and oriented to person, place, and time. She has normal reflexes. She displays normal reflexes. No cranial nerve deficit. She exhibits normal muscle tone. Coordination normal.  Skin: Skin is warm and dry. No rash noted. She is not diaphoretic. No erythema. No pallor.  Lots of moles.   Psychiatric: She has a normal mood and affect. Her behavior is normal. Judgment and thought content normal.    Filed  Vitals:   05/10/14 1627  BP: 156/86  Pulse: 96  Temp: 98.7 F (37.1 C)  TempSrc: Oral  Height: 5\' 5"  (1.651 m)  Weight: 145 lb (65.772 kg)  SpO2: 96%      Labs reviewed: Basic Metabolic Panel:  Recent Labs  11/13/13 1141 01/13/14 2040  NA 133* 137  K 4.2 3.7  CL 99 101  CO2 27 20  GLUCOSE 117* 126*  BUN 13 11  CREATININE 0.7 0.79  CALCIUM 10.1 10.3   Liver Function Tests:  Recent Labs  11/13/13 1141  AST 27  ALT 23  ALKPHOS 61  BILITOT 0.7  PROT 7.5  ALBUMIN 3.9    Recent Labs  11/13/13 1141  LIPASE 15.0   No results for input(s): AMMONIA in the last 8760 hours. CBC:  Recent Labs  11/13/13 1141 11/28/13 1121 01/13/14 2040  WBC 13.4* 12.6* 15.1*  NEUTROABS 11.8* 10.9* 12.6*  HGB 14.0 14.8 14.5  HCT 41.1 44.3 41.5  MCV 92.2 94.2 91.2  PLT 385.0 440.0* 387   Lipid Panel: No results for input(s): CHOL, HDL, LDLCALC, TRIG, CHOLHDL, LDLDIRECT in the last 8760 hours. Anemia Panel: No results for input(s): FOLATE, IRON, VITAMINB12 in the last 8760 hours.  Past Procedures:  04/19/14 KUB: 1cm rectangular metallic density in the left upper quadrant, compatible with foreign body. No bowel obstruction. Moderate stool in colon compatible with constipation. Mild degree of osteoporosis. Mild spondylosis.    Assessment/Plan Hyperlipidemia Diet control, update lipid panel.    Essential hypertension Controlled, takes Amlodipine 5mg , Losartan 100mg , update CMP and TSH   Asthma Noted 2008. Patient states chronic bronchitis. No longer using.  Stable. Prn Ventolin 2 puffs q6h available to her.      GERD Stable, takes Omeprazole 20mg  po daily. Update CBC   History of cardiovascular disorder X 3 in 2000 now on aspirin.     Overactive bladder Myrbetriq prescribed by urology.     DDD (degenerative disc disease), lumbosacral Including lumbar spondylosis. Regular f/u Dr. French Ana. Receives injections last 01/2013.  Tramadol 50mg  bid prn  available to her   Abdominal pain, epigastric. Likely due to duodenal ulcer.  04/19/14 KUB 1cm rectangular metallic density in the left upper quadrant, compatible with foreign body. Update  KUB. No c/o abd pain today.    Abnormality of gait Per the patient: OA every joints, chronic lower back pain, small steps, ambulates with walker.      Family/ Staff Communication: observe the patient.   Goals of Care: AL  Labs/tests ordered: CBC, CMP, TSH, Lipid panel.

## 2014-05-10 NOTE — Assessment & Plan Note (Addendum)
Controlled, takes Amlodipine 5mg , Losartan 100mg , update CMP and TSH

## 2014-05-10 NOTE — Assessment & Plan Note (Signed)
04/19/14 KUB 1cm rectangular metallic density in the left upper quadrant, compatible with foreign body. Update KUB. No c/o abd pain today.

## 2014-05-10 NOTE — Assessment & Plan Note (Signed)
Including lumbar spondylosis. Regular f/u Dr. French Ana. Receives injections last 01/2013.  Tramadol 50mg  bid prn available to her

## 2014-05-10 NOTE — Assessment & Plan Note (Signed)
Myrbetriq prescribed by urology.

## 2014-05-10 NOTE — Assessment & Plan Note (Signed)
X 3 in 2000 now on aspirin.

## 2014-05-10 NOTE — Assessment & Plan Note (Signed)
Noted 2008. Patient states chronic bronchitis. No longer using.  Stable. Prn Ventolin 2 puffs q6h available to her.

## 2014-05-10 NOTE — Assessment & Plan Note (Addendum)
Stable, takes Omeprazole 20mg  po daily. Update CBC

## 2014-05-11 DIAGNOSIS — R109 Unspecified abdominal pain: Secondary | ICD-10-CM | POA: Diagnosis not present

## 2014-05-15 DIAGNOSIS — I10 Essential (primary) hypertension: Secondary | ICD-10-CM | POA: Diagnosis not present

## 2014-05-15 LAB — CBC AND DIFFERENTIAL
HCT: 37 % (ref 36–46)
Hemoglobin: 12.3 g/dL (ref 12.0–16.0)
PLATELETS: 313 10*3/uL (ref 150–399)
WBC: 5 10^3/mL

## 2014-05-15 LAB — BASIC METABOLIC PANEL
BUN: 13 mg/dL (ref 4–21)
Creatinine: 0.6 mg/dL (ref 0.5–1.1)
GLUCOSE: 88 mg/dL
POTASSIUM: 3.9 mmol/L (ref 3.4–5.3)
Sodium: 139 mmol/L (ref 137–147)

## 2014-05-15 LAB — LIPID PANEL
Cholesterol: 176 mg/dL (ref 0–200)
HDL: 62 mg/dL (ref 35–70)
LDL Cholesterol: 95 mg/dL
Triglycerides: 93 mg/dL (ref 40–160)

## 2014-05-15 LAB — TSH: TSH: 1.53 u[IU]/mL (ref 0.41–5.90)

## 2014-05-15 LAB — HEPATIC FUNCTION PANEL
ALK PHOS: 49 U/L (ref 25–125)
ALT: 15 U/L (ref 7–35)
AST: 18 U/L (ref 13–35)
Bilirubin, Total: 0.5 mg/dL

## 2014-05-17 ENCOUNTER — Other Ambulatory Visit: Payer: Self-pay | Admitting: Nurse Practitioner

## 2014-05-17 DIAGNOSIS — R1013 Epigastric pain: Secondary | ICD-10-CM

## 2014-05-17 DIAGNOSIS — E785 Hyperlipidemia, unspecified: Secondary | ICD-10-CM

## 2014-06-06 DIAGNOSIS — N3941 Urge incontinence: Secondary | ICD-10-CM | POA: Diagnosis not present

## 2014-06-06 DIAGNOSIS — N3020 Other chronic cystitis without hematuria: Secondary | ICD-10-CM | POA: Diagnosis not present

## 2014-06-07 ENCOUNTER — Non-Acute Institutional Stay: Payer: Medicare Other | Admitting: Nurse Practitioner

## 2014-06-07 DIAGNOSIS — K219 Gastro-esophageal reflux disease without esophagitis: Secondary | ICD-10-CM | POA: Diagnosis not present

## 2014-06-07 DIAGNOSIS — N3281 Overactive bladder: Secondary | ICD-10-CM | POA: Diagnosis not present

## 2014-06-07 DIAGNOSIS — M5137 Other intervertebral disc degeneration, lumbosacral region: Secondary | ICD-10-CM | POA: Diagnosis not present

## 2014-06-07 DIAGNOSIS — J069 Acute upper respiratory infection, unspecified: Secondary | ICD-10-CM | POA: Diagnosis not present

## 2014-06-07 DIAGNOSIS — J4520 Mild intermittent asthma, uncomplicated: Secondary | ICD-10-CM | POA: Diagnosis not present

## 2014-06-07 DIAGNOSIS — I10 Essential (primary) hypertension: Secondary | ICD-10-CM

## 2014-06-07 NOTE — Assessment & Plan Note (Signed)
Stable, takes Omeprazole 20mg  po daily

## 2014-06-07 NOTE — Assessment & Plan Note (Signed)
Noted 2008. Patient states chronic bronchitis.  Stable. Prn Ventolin 2 puffs q6h available to her.

## 2014-06-07 NOTE — Progress Notes (Signed)
Patient ID: Brandy Crawford, female   DOB: Jun 28, 1923, 81 y.o.   MRN: [id number]   Code Status: Full code Living Will POA  Allergies  Allergen Reactions  . Amoxicillin     Per MAR  . Oxycodone Other (See Comments)    Per MAR   . Sulfamethoxazole     Per MAR   . Penicillins Rash    Chief Complaint  Patient presents with  . Medical Management of Chronic Issues  . Acute Visit    cough and nasal congestion x 4 days.     HPI: Patient is a 81 y.o. female seen in the clinic at [facility] today for cough, nasal congestion, yellow phlegm production x 4 days  and other chronic medical conditions.  Problem List Items Addressed This Visit    Overactive bladder    Saw Urology 06/06/14-continued with Myrbetriq-1-2x/nihgt bathroom trips. Stress incontinent in natures.       GERD    Stable, takes Omeprazole 20mg  po daily      Essential hypertension    Controlled, takes Amlodipine 5mg , Losartan 100mg       DDD (degenerative disc disease), lumbosacral    Including lumbar spondylosis. Regular f/u Dr. French Ana. Receives injections last 01/2013.  Tramadol 50mg  bid prn available to her       Asthma    Noted 2008. Patient states chronic bronchitis.  Stable. Prn Ventolin 2 puffs q6h available to her.         Acute upper respiratory infection - Primary    C/o nasal congestion, cough, yellow phlegm production x 4 days, denied chest pain, she is afebrile, no O2 desaturation. Will try Avelox 400mg  daily and Mucinex 600mg  bid x 5 days-observe the patient.          Review of Systems:  Review of Systems  Constitutional: Negative for fever, chills, weight loss, malaise/fatigue and diaphoresis.  HENT: Positive for congestion and hearing loss. Negative for ear discharge, ear pain, nosebleeds, sore throat and tinnitus.   Eyes: Negative for blurred vision, double vision, photophobia, pain, discharge and redness.  Respiratory: Positive for cough. Negative for hemoptysis, sputum  production, shortness of breath, wheezing and stridor.   Cardiovascular: Positive for leg swelling. Negative for chest pain, palpitations, orthopnea, claudication and PND.       Trace  Gastrointestinal: Negative for heartburn, nausea, vomiting, abdominal pain, diarrhea, constipation, blood in stool and melena.  Genitourinary: Positive for frequency. Negative for dysuria, urgency, hematuria and flank pain.       1-2x/night. Stress incontinence  Musculoskeletal: Positive for back pain and joint pain. Negative for myalgias, falls and neck pain.  Skin: Negative for itching and rash.  Neurological: Negative for dizziness, tingling, tremors, sensory change, speech change, focal weakness, seizures, loss of consciousness, weakness and headaches.  Endo/Heme/Allergies: Negative for environmental allergies and polydipsia. Does not bruise/bleed easily.  Psychiatric/Behavioral: Negative for depression, suicidal ideas, hallucinations, memory loss and substance abuse. The patient is not nervous/anxious and does not have insomnia.      Past Medical History  Diagnosis Date  . Asthma   . GERD (gastroesophageal reflux disease)   . Hypertension   . Osteoporosis   . TIA (transient ischemic attack) 09/20/2006  . Hyperlipemia   . Cervical disc disease   . Anemia   . Depression   . Lumbosacral spondylosis 10/28/2009  . Scoliosis    Past Surgical History  Procedure Laterality Date  . Appendectomy    . Abdominal hysterectomy    . Tonsillectomy    .  Cholecystectomy     Social History:   reports that she has never smoked. She has never used smokeless tobacco. She reports that she does not drink alcohol or use illicit drugs.  Family History  Problem Relation Age of Onset  . Pancreatic cancer Father 65    deceased  . Heart failure Mother 104    deceased  . Leukemia Brother 76    deceased  . Diabetes type II    . Alcohol abuse      Medications: Patient's Medications  New Prescriptions   No  medications on file  Previous Medications   ACETAMINOPHEN (TYLENOL) 325 MG TABLET    Take 650 mg by mouth 3 (three) times daily.   ALBUTEROL (PROVENTIL HFA;VENTOLIN HFA) 108 (90 BASE) MCG/ACT INHALER    Inhale 2 puffs into the lungs every 6 (six) hours as needed for wheezing or shortness of breath.   AMLODIPINE (NORVASC) 5 MG TABLET    Take 5 mg by mouth daily with breakfast.    ASCORBIC ACID (VITAMIN C) 500 MG TABLET    Take 500 mg by mouth daily with breakfast.    ASPIRIN 81 MG CHEWABLE TABLET    Chew 81 mg by mouth daily with breakfast.    LOSARTAN (COZAAR) 100 MG TABLET    Take 100 mg by mouth daily with breakfast.    MIRABEGRON ER (MYRBETRIQ) 25 MG TB24    Take 25 tablets by mouth daily with breakfast. Prescribed by urology   OMEPRAZOLE (PRILOSEC) 20 MG CAPSULE    Take 20 mg by mouth daily with breakfast.   PYRIDOXINE (VITAMIN B-6) 100 MG TABLET    Take 100 mg by mouth daily with breakfast.    TRAMADOL (ULTRAM) 50 MG TABLET    Take 1 tablet (50 mg total) by mouth 2 (two) times daily as needed for moderate pain (schedule morning dose with breakfast, evening dose prn).   VITAMIN E (VITAMIN E) 400 UNIT CAPSULE    Take 400 Units by mouth daily with breakfast.   Modified Medications   No medications on file  Discontinued Medications   No medications on file     Physical Exam: Physical Exam  Constitutional: She is oriented to person, place, and time. She appears well-developed and well-nourished. No distress.  HENT:  Head: Normocephalic and atraumatic.  Right Ear: External ear normal. No lacerations. No drainage. No foreign bodies. No mastoid tenderness. Tympanic membrane is not injected, not scarred, not perforated, not erythematous, not retracted and not bulging. Tympanic membrane mobility is abnormal. No middle ear effusion. Decreased hearing is noted.  Left Ear: External ear normal. No lacerations. No drainage. No foreign bodies. No mastoid tenderness. Tympanic membrane is not injected,  not scarred, not perforated, not erythematous, not retracted and not bulging. Tympanic membrane mobility is abnormal.  No middle ear effusion. Decreased hearing is noted.  Nose: Nose normal.  Mouth/Throat: Oropharynx is clear and moist. No oropharyngeal exudate.  Eyes: Conjunctivae and EOM are normal. Pupils are equal, round, and reactive to light. Right eye exhibits no discharge. Left eye exhibits no discharge. No scleral icterus.  Neck: Normal range of motion. Neck supple. No JVD present. No tracheal deviation present. No thyromegaly present.  Cardiovascular: Normal rate, regular rhythm, normal heart sounds and intact distal pulses.  Exam reveals no gallop and no friction rub.   No murmur heard. Pulmonary/Chest: Effort normal and breath sounds normal. No stridor. No respiratory distress. She has no wheezes. She has no rales. She exhibits no  tenderness.  Coarse breath sounds auscultated.   Abdominal: Soft. Bowel sounds are normal. She exhibits no distension and no mass. There is no tenderness. There is no rebound and no guarding.  Genitourinary: Guaiac negative stool.  Musculoskeletal: She exhibits edema. She exhibits no tenderness.  Trace in ankle.   Lymphadenopathy:    She has no cervical adenopathy.  Neurological: She is alert and oriented to person, place, and time. She has normal reflexes. No cranial nerve deficit. She exhibits normal muscle tone. Coordination normal.  Skin: Skin is warm and dry. No rash noted. She is not diaphoretic. No erythema. No pallor.  Lots of moles.   Psychiatric: She has a normal mood and affect. Her behavior is normal. Judgment and thought content normal.    Filed Vitals:   06/07/14 0944  BP: 140/70  Pulse: 72  Temp: 99.3 F (37.4 C)  TempSrc: Tympanic  Resp: 16      Labs reviewed: Basic Metabolic Panel:  Recent Labs  11/13/13 1141 01/13/14 2040 05/15/14  NA 133* 137 139  K 4.2 3.7 3.9  CL 99 101  --   CO2 27 20  --   GLUCOSE 117* 126*  --    BUN 13 11 13   CREATININE 0.7 0.79 0.6  CALCIUM 10.1 10.3  --   TSH  --   --  1.53   Liver Function Tests:  Recent Labs  11/13/13 1141 05/15/14  AST 27 18  ALT 23 15  ALKPHOS 61 49  BILITOT 0.7  --   PROT 7.5  --   ALBUMIN 3.9  --     Recent Labs  11/13/13 1141  LIPASE 15.0   No results for input(s): AMMONIA in the last 8760 hours. CBC:  Recent Labs  11/13/13 1141 11/28/13 1121 01/13/14 2040 05/15/14  WBC 13.4* 12.6* 15.1* 5.0  NEUTROABS 11.8* 10.9* 12.6*  --   HGB 14.0 14.8 14.5 12.3  HCT 41.1 44.3 41.5 37  MCV 92.2 94.2 91.2  --   PLT 385.0 440.0* 387 313   Lipid Panel:  Recent Labs  05/15/14  CHOL 176  HDL 62  LDLCALC 95  TRIG 93   Anemia Panel: No results for input(s): FOLATE, IRON, VITAMINB12 in the last 8760 hours.  Past Procedures:  04/19/14 KUB: 1cm rectangular metallic density in the left upper quadrant, compatible with foreign body. No bowel obstruction. Moderate stool in colon compatible with constipation. Mild degree of osteoporosis. Mild spondylosis.    Assessment/Plan Acute upper respiratory infection C/o nasal congestion, cough, yellow phlegm production x 4 days, denied chest pain, she is afebrile, no O2 desaturation. Will try Avelox 400mg  daily and Mucinex 600mg  bid x 5 days-observe the patient.    Overactive bladder Saw Urology 06/06/14-continued with Myrbetriq-1-2x/nihgt bathroom trips. Stress incontinent in natures.    GERD Stable, takes Omeprazole 20mg  po daily   Essential hypertension Controlled, takes Amlodipine 5mg , Losartan 100mg    DDD (degenerative disc disease), lumbosacral Including lumbar spondylosis. Regular f/u Dr. French Ana. Receives injections last 01/2013.  Tramadol 50mg  bid prn available to her    Asthma Noted 2008. Patient states chronic bronchitis.  Stable. Prn Ventolin 2 puffs q6h available to her.        Family/ Staff Communication: observe the patient.   Goals of Care: AL  Labs/tests ordered:  none

## 2014-06-07 NOTE — Assessment & Plan Note (Signed)
Including lumbar spondylosis. Regular f/u Dr. French Ana. Receives injections last 01/2013.  Tramadol 50mg  bid prn available to her

## 2014-06-07 NOTE — Assessment & Plan Note (Signed)
C/o nasal congestion, cough, yellow phlegm production x 4 days, denied chest pain, she is afebrile, no O2 desaturation. Will try Avelox 400mg  daily and Mucinex 600mg  bid x 5 days-observe the patient.

## 2014-06-07 NOTE — Assessment & Plan Note (Signed)
Controlled, takes Amlodipine 5mg , Losartan 100mg 

## 2014-06-07 NOTE — Assessment & Plan Note (Signed)
Saw Urology 06/06/14-continued with Myrbetriq-1-2x/nihgt bathroom trips. Stress incontinent in natures.

## 2014-08-02 DIAGNOSIS — M25552 Pain in left hip: Secondary | ICD-10-CM | POA: Diagnosis not present

## 2014-10-19 ENCOUNTER — Non-Acute Institutional Stay: Payer: Medicare Other | Admitting: Nurse Practitioner

## 2014-10-19 ENCOUNTER — Encounter: Payer: Self-pay | Admitting: Nurse Practitioner

## 2014-10-19 DIAGNOSIS — M5137 Other intervertebral disc degeneration, lumbosacral region: Secondary | ICD-10-CM

## 2014-10-19 DIAGNOSIS — R197 Diarrhea, unspecified: Secondary | ICD-10-CM

## 2014-10-19 DIAGNOSIS — I10 Essential (primary) hypertension: Secondary | ICD-10-CM | POA: Diagnosis not present

## 2014-10-19 DIAGNOSIS — K219 Gastro-esophageal reflux disease without esophagitis: Secondary | ICD-10-CM | POA: Diagnosis not present

## 2014-10-19 DIAGNOSIS — N3281 Overactive bladder: Secondary | ICD-10-CM

## 2014-10-19 DIAGNOSIS — Z8679 Personal history of other diseases of the circulatory system: Secondary | ICD-10-CM

## 2014-10-19 NOTE — Assessment & Plan Note (Signed)
Controlled, takes Amlodipine 5mg , Losartan 100mg 

## 2014-10-19 NOTE — Assessment & Plan Note (Signed)
Including lumbar spondylosis. Regular f/u Dr. French Ana. Receives injections last 01/2013.  Tramadol 50mg  bid prn available to her Takes Tylenol 650mg  tid.

## 2014-10-19 NOTE — Assessment & Plan Note (Signed)
2-3x/week, chronic, denied abd pain, nausea, or constipation. Dc Omeprazole, try Questran 4gm bid. Update CBC and CMP

## 2014-10-19 NOTE — Assessment & Plan Note (Signed)
Asymptomatic, dc Omeprazole in setting of on and off diarrhea.

## 2014-10-19 NOTE — Assessment & Plan Note (Signed)
X 3 in 2000 now on aspirin.

## 2014-10-19 NOTE — Progress Notes (Signed)
Patient ID: Brandy Crawford, female   DOB: July 07, 1923, 81 y.o.   MRN: [id number]   Code Status: DNR Living Will POA  Allergies  Allergen Reactions  . Amoxicillin     Per MAR  . Oxycodone Other (See Comments)    Per MAR   . Sulfamethoxazole     Per MAR   . Penicillins Rash    Chief Complaint  Patient presents with  . Medical Management of Chronic Issues  . Acute Visit    diarrhea    HPI: Patient is a 81 y.o. female seen in the AL at [facility] today for diarrhea and chronic medical conditions.  Problem List Items Addressed This Visit    Essential hypertension    Controlled, takes Amlodipine 5mg , Losartan 100mg        GERD - Primary    Asymptomatic, dc Omeprazole in setting of on and off diarrhea.       DDD (degenerative disc disease), lumbosacral    Including lumbar spondylosis. Regular f/u Dr. French Ana. Receives injections last 01/2013.  Tramadol 50mg  bid prn available to her Takes Tylenol 650mg  tid.        History of cardiovascular disorder    X 3 in 2000 now on aspirin.         Overactive bladder    Saw Urology 06/06/14-continued with Myrbetriq-1-2x/nihgt bathroom trips. Stress incontinent in natures.        Diarrhea    2-3x/week, chronic, denied abd pain, nausea, or constipation. Dc Omeprazole, try Questran 4gm bid. Update CBC and CMP         Review of Systems:  Review of Systems  Constitutional: Negative for fever, chills, weight loss, malaise/fatigue and diaphoresis.  HENT: Positive for hearing loss. Negative for congestion, ear discharge, ear pain, nosebleeds, sore throat and tinnitus.   Eyes: Negative for blurred vision, double vision, photophobia, pain, discharge and redness.  Respiratory: Negative for cough, hemoptysis, sputum production, shortness of breath, wheezing and stridor.   Cardiovascular: Positive for leg swelling. Negative for chest pain, palpitations, orthopnea, claudication and PND.       Trace  Gastrointestinal:  Positive for diarrhea. Negative for heartburn, nausea, vomiting, abdominal pain, constipation, blood in stool and melena.  Genitourinary: Positive for frequency. Negative for dysuria, urgency, hematuria and flank pain.       1-2x/night. Stress incontinence  Musculoskeletal: Positive for back pain and joint pain. Negative for myalgias, falls and neck pain.  Skin: Negative for itching and rash.  Neurological: Negative for dizziness, tingling, tremors, sensory change, speech change, focal weakness, seizures, loss of consciousness, weakness and headaches.  Endo/Heme/Allergies: Negative for environmental allergies and polydipsia. Does not bruise/bleed easily.  Psychiatric/Behavioral: Negative for depression, suicidal ideas, hallucinations, memory loss and substance abuse. The patient is not nervous/anxious and does not have insomnia.      Past Medical History  Diagnosis Date  . Asthma   . GERD (gastroesophageal reflux disease)   . Hypertension   . Osteoporosis   . TIA (transient ischemic attack) 09/20/2006  . Hyperlipemia   . Cervical disc disease   . Anemia   . Depression   . Lumbosacral spondylosis 10/28/2009  . Scoliosis    Past Surgical History  Procedure Laterality Date  . Appendectomy    . Abdominal hysterectomy    . Tonsillectomy    . Cholecystectomy     Social History:   reports that she has never smoked. She has never used smokeless tobacco. She reports that she does not drink  alcohol or use illicit drugs.  Family History  Problem Relation Age of Onset  . Pancreatic cancer Father 40    deceased  . Heart failure Mother 104    deceased  . Leukemia Brother 31    deceased  . Diabetes type II    . Alcohol abuse      Medications: Patient's Medications  New Prescriptions   No medications on file  Previous Medications   ACETAMINOPHEN (TYLENOL) 325 MG TABLET    Take 650 mg by mouth 3 (three) times daily.   ALBUTEROL (PROVENTIL HFA;VENTOLIN HFA) 108 (90 BASE) MCG/ACT  INHALER    Inhale 2 puffs into the lungs every 6 (six) hours as needed for wheezing or shortness of breath.   AMLODIPINE (NORVASC) 5 MG TABLET    Take 5 mg by mouth daily with breakfast.    ASCORBIC ACID (VITAMIN C) 500 MG TABLET    Take 500 mg by mouth daily with breakfast.    ASPIRIN 81 MG CHEWABLE TABLET    Chew 81 mg by mouth daily with breakfast.    LOSARTAN (COZAAR) 100 MG TABLET    Take 100 mg by mouth daily with breakfast.    MIRABEGRON ER (MYRBETRIQ) 25 MG TB24    Take 25 tablets by mouth daily with breakfast. Prescribed by urology   OMEPRAZOLE (PRILOSEC) 20 MG CAPSULE    Take 20 mg by mouth daily with breakfast.   PYRIDOXINE (VITAMIN B-6) 100 MG TABLET    Take 100 mg by mouth daily with breakfast.    TRAMADOL (ULTRAM) 50 MG TABLET    Take 1 tablet (50 mg total) by mouth 2 (two) times daily as needed for moderate pain (schedule morning dose with breakfast, evening dose prn).   VITAMIN E (VITAMIN E) 400 UNIT CAPSULE    Take 400 Units by mouth daily with breakfast.   Modified Medications   No medications on file  Discontinued Medications   No medications on file     Physical Exam: Physical Exam  Constitutional: She is oriented to person, place, and time. She appears well-developed and well-nourished. No distress.  HENT:  Head: Normocephalic and atraumatic.  Right Ear: External ear normal. No lacerations. No drainage. No foreign bodies. No mastoid tenderness. Tympanic membrane is not injected, not scarred, not perforated, not erythematous, not retracted and not bulging. Tympanic membrane mobility is abnormal. No middle ear effusion. Decreased hearing is noted.  Left Ear: External ear normal. No lacerations. No drainage. No foreign bodies. No mastoid tenderness. Tympanic membrane is not injected, not scarred, not perforated, not erythematous, not retracted and not bulging. Tympanic membrane mobility is abnormal.  No middle ear effusion. Decreased hearing is noted.  Nose: Nose normal.    Mouth/Throat: Oropharynx is clear and moist. No oropharyngeal exudate.  Eyes: Conjunctivae and EOM are normal. Pupils are equal, round, and reactive to light. Right eye exhibits no discharge. Left eye exhibits no discharge. No scleral icterus.  Neck: Normal range of motion. Neck supple. No JVD present. No tracheal deviation present. No thyromegaly present.  Cardiovascular: Normal rate, regular rhythm, normal heart sounds and intact distal pulses.  Exam reveals no gallop and no friction rub.   No murmur heard. Pulmonary/Chest: Effort normal and breath sounds normal. No stridor. No respiratory distress. She has no wheezes. She has no rales. She exhibits no tenderness.  Abdominal: Soft. Bowel sounds are normal. She exhibits no distension and no mass. There is no tenderness. There is no rebound and no guarding.  Genitourinary: Guaiac negative stool.  Musculoskeletal: She exhibits edema. She exhibits no tenderness.  Trace in ankle.   Lymphadenopathy:    She has no cervical adenopathy.  Neurological: She is alert and oriented to person, place, and time. She has normal reflexes. No cranial nerve deficit. She exhibits normal muscle tone. Coordination normal.  Skin: Skin is warm and dry. No rash noted. She is not diaphoretic. No erythema. No pallor.  Lots of moles.   Psychiatric: She has a normal mood and affect. Her behavior is normal. Judgment and thought content normal.    Filed Vitals:   10/19/14 1418  BP: 130/68  Pulse: 64  Temp: 98.2 F (36.8 C)  TempSrc: Tympanic  Resp: 18      Labs reviewed: Basic Metabolic Panel:  Recent Labs  11/13/13 1141 01/13/14 2040 05/15/14  NA 133* 137 139  K 4.2 3.7 3.9  CL 99 101  --   CO2 27 20  --   GLUCOSE 117* 126*  --   BUN 13 11 13   CREATININE 0.7 0.79 0.6  CALCIUM 10.1 10.3  --   TSH  --   --  1.53   Liver Function Tests:  Recent Labs  11/13/13 1141 05/15/14  AST 27 18  ALT 23 15  ALKPHOS 61 49  BILITOT 0.7  --   PROT 7.5  --    ALBUMIN 3.9  --     Recent Labs  11/13/13 1141  LIPASE 15.0   No results for input(s): AMMONIA in the last 8760 hours. CBC:  Recent Labs  11/13/13 1141 11/28/13 1121 01/13/14 2040 05/15/14  WBC 13.4* 12.6* 15.1* 5.0  NEUTROABS 11.8* 10.9* 12.6*  --   HGB 14.0 14.8 14.5 12.3  HCT 41.1 44.3 41.5 37  MCV 92.2 94.2 91.2  --   PLT 385.0 440.0* 387 313   Lipid Panel:  Recent Labs  05/15/14  CHOL 176  HDL 62  LDLCALC 95  TRIG 93   Anemia Panel: No results for input(s): FOLATE, IRON, VITAMINB12 in the last 8760 hours.  Past Procedures:  04/19/14 KUB: 1cm rectangular metallic density in the left upper quadrant, compatible with foreign body. No bowel obstruction. Moderate stool in colon compatible with constipation. Mild degree of osteoporosis. Mild spondylosis.    Assessment/Plan GERD Asymptomatic, dc Omeprazole in setting of on and off diarrhea.   Diarrhea 2-3x/week, chronic, denied abd pain, nausea, or constipation. Dc Omeprazole, try Questran 4gm bid. Update CBC and CMP  Essential hypertension Controlled, takes Amlodipine 5mg , Losartan 100mg    Overactive bladder Saw Urology 06/06/14-continued with Myrbetriq-1-2x/nihgt bathroom trips. Stress incontinent in natures.    History of cardiovascular disorder X 3 in 2000 now on aspirin.     DDD (degenerative disc disease), lumbosacral Including lumbar spondylosis. Regular f/u Dr. French Ana. Receives injections last 01/2013.  Tramadol 50mg  bid prn available to her Takes Tylenol 650mg  tid.      Family/ Staff Communication: observe the patient.   Goals of Care: AL  Labs/tests ordered: CBC and CMP

## 2014-10-19 NOTE — Assessment & Plan Note (Signed)
Saw Urology 06/06/14-continued with Myrbetriq-1-2x/nihgt bathroom trips. Stress incontinent in natures.

## 2014-10-23 DIAGNOSIS — I10 Essential (primary) hypertension: Secondary | ICD-10-CM | POA: Diagnosis not present

## 2014-10-23 DIAGNOSIS — K2971 Gastritis, unspecified, with bleeding: Secondary | ICD-10-CM | POA: Diagnosis not present

## 2014-10-23 LAB — HEPATIC FUNCTION PANEL
ALK PHOS: 58 U/L (ref 25–125)
ALT: 16 U/L (ref 7–35)
AST: 15 U/L (ref 13–35)
Bilirubin, Total: 0.5 mg/dL

## 2014-10-23 LAB — BASIC METABOLIC PANEL
BUN: 15 mg/dL (ref 4–21)
Creatinine: 0.6 mg/dL (ref 0.5–1.1)
GLUCOSE: 88 mg/dL
Potassium: 4.3 mmol/L (ref 3.4–5.3)
SODIUM: 141 mmol/L (ref 137–147)

## 2014-10-23 LAB — CBC AND DIFFERENTIAL
HEMATOCRIT: 37 % (ref 36–46)
Hemoglobin: 12.5 g/dL (ref 12.0–16.0)
Platelets: 323 10*3/uL (ref 150–399)
WBC: 7.8 10^3/mL

## 2014-10-25 ENCOUNTER — Emergency Department (HOSPITAL_COMMUNITY): Payer: Medicare Other

## 2014-10-25 ENCOUNTER — Inpatient Hospital Stay (HOSPITAL_COMMUNITY): Payer: Medicare Other

## 2014-10-25 ENCOUNTER — Encounter (HOSPITAL_COMMUNITY): Payer: Self-pay | Admitting: Emergency Medicine

## 2014-10-25 ENCOUNTER — Inpatient Hospital Stay (HOSPITAL_COMMUNITY)
Admission: EM | Admit: 2014-10-25 | Discharge: 2014-10-30 | DRG: 853 | Disposition: A | Discharge status: Skilled Nursing Facility | Payer: Medicare Other | Attending: Internal Medicine | Admitting: Internal Medicine

## 2014-10-25 ENCOUNTER — Other Ambulatory Visit: Payer: Self-pay | Admitting: Nurse Practitioner

## 2014-10-25 DIAGNOSIS — D500 Iron deficiency anemia secondary to blood loss (chronic): Secondary | ICD-10-CM | POA: Diagnosis not present

## 2014-10-25 DIAGNOSIS — R1031 Right lower quadrant pain: Secondary | ICD-10-CM | POA: Diagnosis not present

## 2014-10-25 DIAGNOSIS — Z882 Allergy status to sulfonamides status: Secondary | ICD-10-CM

## 2014-10-25 DIAGNOSIS — R109 Unspecified abdominal pain: Secondary | ICD-10-CM | POA: Diagnosis not present

## 2014-10-25 DIAGNOSIS — M419 Scoliosis, unspecified: Secondary | ICD-10-CM | POA: Diagnosis present

## 2014-10-25 DIAGNOSIS — R197 Diarrhea, unspecified: Secondary | ICD-10-CM

## 2014-10-25 DIAGNOSIS — S0181XA Laceration without foreign body of other part of head, initial encounter: Secondary | ICD-10-CM | POA: Diagnosis present

## 2014-10-25 DIAGNOSIS — Z8249 Family history of ischemic heart disease and other diseases of the circulatory system: Secondary | ICD-10-CM | POA: Diagnosis not present

## 2014-10-25 DIAGNOSIS — J45909 Unspecified asthma, uncomplicated: Secondary | ICD-10-CM | POA: Diagnosis present

## 2014-10-25 DIAGNOSIS — Z538 Procedure and treatment not carried out for other reasons: Secondary | ICD-10-CM | POA: Diagnosis not present

## 2014-10-25 DIAGNOSIS — E785 Hyperlipidemia, unspecified: Secondary | ICD-10-CM | POA: Diagnosis present

## 2014-10-25 DIAGNOSIS — R41841 Cognitive communication deficit: Secondary | ICD-10-CM | POA: Diagnosis not present

## 2014-10-25 DIAGNOSIS — Z7982 Long term (current) use of aspirin: Secondary | ICD-10-CM | POA: Diagnosis not present

## 2014-10-25 DIAGNOSIS — R079 Chest pain, unspecified: Secondary | ICD-10-CM | POA: Diagnosis not present

## 2014-10-25 DIAGNOSIS — Z66 Do not resuscitate: Secondary | ICD-10-CM | POA: Diagnosis present

## 2014-10-25 DIAGNOSIS — G459 Transient cerebral ischemic attack, unspecified: Secondary | ICD-10-CM | POA: Diagnosis not present

## 2014-10-25 DIAGNOSIS — Z8673 Personal history of transient ischemic attack (TIA), and cerebral infarction without residual deficits: Secondary | ICD-10-CM | POA: Diagnosis not present

## 2014-10-25 DIAGNOSIS — K922 Gastrointestinal hemorrhage, unspecified: Secondary | ICD-10-CM | POA: Diagnosis not present

## 2014-10-25 DIAGNOSIS — D62 Acute posthemorrhagic anemia: Secondary | ICD-10-CM | POA: Diagnosis not present

## 2014-10-25 DIAGNOSIS — E162 Hypoglycemia, unspecified: Secondary | ICD-10-CM | POA: Diagnosis not present

## 2014-10-25 DIAGNOSIS — N390 Urinary tract infection, site not specified: Secondary | ICD-10-CM | POA: Diagnosis present

## 2014-10-25 DIAGNOSIS — W19XXXA Unspecified fall, initial encounter: Secondary | ICD-10-CM | POA: Diagnosis present

## 2014-10-25 DIAGNOSIS — Z806 Family history of leukemia: Secondary | ICD-10-CM

## 2014-10-25 DIAGNOSIS — B9620 Unspecified Escherichia coli [E. coli] as the cause of diseases classified elsewhere: Secondary | ICD-10-CM | POA: Diagnosis present

## 2014-10-25 DIAGNOSIS — K921 Melena: Secondary | ICD-10-CM | POA: Diagnosis not present

## 2014-10-25 DIAGNOSIS — Z833 Family history of diabetes mellitus: Secondary | ICD-10-CM

## 2014-10-25 DIAGNOSIS — K219 Gastro-esophageal reflux disease without esophagitis: Secondary | ICD-10-CM | POA: Diagnosis present

## 2014-10-25 DIAGNOSIS — R269 Unspecified abnormalities of gait and mobility: Secondary | ICD-10-CM | POA: Diagnosis not present

## 2014-10-25 DIAGNOSIS — R1030 Lower abdominal pain, unspecified: Secondary | ICD-10-CM | POA: Diagnosis not present

## 2014-10-25 DIAGNOSIS — Z79899 Other long term (current) drug therapy: Secondary | ICD-10-CM | POA: Diagnosis not present

## 2014-10-25 DIAGNOSIS — F329 Major depressive disorder, single episode, unspecified: Secondary | ICD-10-CM | POA: Diagnosis present

## 2014-10-25 DIAGNOSIS — I701 Atherosclerosis of renal artery: Secondary | ICD-10-CM | POA: Diagnosis not present

## 2014-10-25 DIAGNOSIS — K2971 Gastritis, unspecified, with bleeding: Secondary | ICD-10-CM | POA: Diagnosis not present

## 2014-10-25 DIAGNOSIS — Z79891 Long term (current) use of opiate analgesic: Secondary | ICD-10-CM | POA: Diagnosis not present

## 2014-10-25 DIAGNOSIS — H81399 Other peripheral vertigo, unspecified ear: Secondary | ICD-10-CM | POA: Diagnosis not present

## 2014-10-25 DIAGNOSIS — G9340 Encephalopathy, unspecified: Secondary | ICD-10-CM | POA: Diagnosis not present

## 2014-10-25 DIAGNOSIS — A419 Sepsis, unspecified organism: Principal | ICD-10-CM | POA: Diagnosis present

## 2014-10-25 DIAGNOSIS — F328 Other depressive episodes: Secondary | ICD-10-CM | POA: Diagnosis not present

## 2014-10-25 DIAGNOSIS — K559 Vascular disorder of intestine, unspecified: Secondary | ICD-10-CM | POA: Diagnosis present

## 2014-10-25 DIAGNOSIS — D649 Anemia, unspecified: Secondary | ICD-10-CM | POA: Diagnosis not present

## 2014-10-25 DIAGNOSIS — J158 Pneumonia due to other specified bacteria: Secondary | ICD-10-CM | POA: Diagnosis not present

## 2014-10-25 DIAGNOSIS — M25529 Pain in unspecified elbow: Secondary | ICD-10-CM | POA: Diagnosis not present

## 2014-10-25 DIAGNOSIS — W06XXXA Fall from bed, initial encounter: Secondary | ICD-10-CM | POA: Diagnosis present

## 2014-10-25 DIAGNOSIS — Z800 Family history of malignant neoplasm of digestive organs: Secondary | ICD-10-CM | POA: Diagnosis not present

## 2014-10-25 DIAGNOSIS — J45998 Other asthma: Secondary | ICD-10-CM | POA: Diagnosis not present

## 2014-10-25 DIAGNOSIS — M810 Age-related osteoporosis without current pathological fracture: Secondary | ICD-10-CM | POA: Diagnosis present

## 2014-10-25 DIAGNOSIS — S82001D Unspecified fracture of right patella, subsequent encounter for closed fracture with routine healing: Secondary | ICD-10-CM | POA: Diagnosis not present

## 2014-10-25 DIAGNOSIS — M6281 Muscle weakness (generalized): Secondary | ICD-10-CM | POA: Diagnosis not present

## 2014-10-25 DIAGNOSIS — M4806 Spinal stenosis, lumbar region: Secondary | ICD-10-CM | POA: Diagnosis not present

## 2014-10-25 DIAGNOSIS — K5730 Diverticulosis of large intestine without perforation or abscess without bleeding: Secondary | ICD-10-CM | POA: Diagnosis not present

## 2014-10-25 DIAGNOSIS — R443 Hallucinations, unspecified: Secondary | ICD-10-CM | POA: Diagnosis not present

## 2014-10-25 DIAGNOSIS — K5790 Diverticulosis of intestine, part unspecified, without perforation or abscess without bleeding: Secondary | ICD-10-CM | POA: Diagnosis present

## 2014-10-25 DIAGNOSIS — I10 Essential (primary) hypertension: Secondary | ICD-10-CM | POA: Diagnosis present

## 2014-10-25 DIAGNOSIS — Y92129 Unspecified place in nursing home as the place of occurrence of the external cause: Secondary | ICD-10-CM

## 2014-10-25 DIAGNOSIS — Z781 Physical restraint status: Secondary | ICD-10-CM

## 2014-10-25 DIAGNOSIS — S0990XA Unspecified injury of head, initial encounter: Secondary | ICD-10-CM | POA: Diagnosis not present

## 2014-10-25 DIAGNOSIS — Z885 Allergy status to narcotic agent status: Secondary | ICD-10-CM | POA: Diagnosis not present

## 2014-10-25 DIAGNOSIS — R2681 Unsteadiness on feet: Secondary | ICD-10-CM | POA: Diagnosis not present

## 2014-10-25 DIAGNOSIS — R296 Repeated falls: Secondary | ICD-10-CM | POA: Diagnosis not present

## 2014-10-25 DIAGNOSIS — S0180XA Unspecified open wound of other part of head, initial encounter: Secondary | ICD-10-CM | POA: Diagnosis not present

## 2014-10-25 DIAGNOSIS — Z880 Allergy status to penicillin: Secondary | ICD-10-CM | POA: Diagnosis not present

## 2014-10-25 DIAGNOSIS — N3281 Overactive bladder: Secondary | ICD-10-CM | POA: Diagnosis present

## 2014-10-25 HISTORY — DX: Encephalopathy, unspecified: G93.40

## 2014-10-25 HISTORY — DX: Unspecified fall, initial encounter: W19.XXXA

## 2014-10-25 HISTORY — DX: Diarrhea, unspecified: R19.7

## 2014-10-25 HISTORY — DX: Unspecified place in nursing home as the place of occurrence of the external cause: Y92.129

## 2014-10-25 LAB — URINALYSIS, ROUTINE W REFLEX MICROSCOPIC
Bilirubin Urine: NEGATIVE
Glucose, UA: NEGATIVE mg/dL
Hgb urine dipstick: NEGATIVE
Ketones, ur: NEGATIVE mg/dL
NITRITE: POSITIVE — AB
PH: 5.5 (ref 5.0–8.0)
PROTEIN: NEGATIVE mg/dL
Specific Gravity, Urine: 1.033 — ABNORMAL HIGH (ref 1.005–1.030)
UROBILINOGEN UA: 0.2 mg/dL (ref 0.0–1.0)

## 2014-10-25 LAB — DIFFERENTIAL
BASOS PCT: 0 % (ref 0–1)
Basophils Absolute: 0 10*3/uL (ref 0.0–0.1)
EOS ABS: 0 10*3/uL (ref 0.0–0.7)
EOS PCT: 0 % (ref 0–5)
LYMPHS PCT: 2 % — AB (ref 12–46)
Lymphs Abs: 0.5 10*3/uL — ABNORMAL LOW (ref 0.7–4.0)
MONOS PCT: 5 % (ref 3–12)
Monocytes Absolute: 1 10*3/uL (ref 0.1–1.0)
Neutro Abs: 19.1 10*3/uL — ABNORMAL HIGH (ref 1.7–7.7)
Neutrophils Relative %: 93 % — ABNORMAL HIGH (ref 43–77)

## 2014-10-25 LAB — COMPREHENSIVE METABOLIC PANEL
ALBUMIN: 3.7 g/dL (ref 3.5–5.0)
ALT: 22 U/L (ref 14–54)
AST: 23 U/L (ref 15–41)
Alkaline Phosphatase: 55 U/L (ref 38–126)
Anion gap: 6 (ref 5–15)
BUN: 28 mg/dL — ABNORMAL HIGH (ref 6–20)
CO2: 23 mmol/L (ref 22–32)
Calcium: 9.5 mg/dL (ref 8.9–10.3)
Chloride: 105 mmol/L (ref 101–111)
Creatinine, Ser: 0.67 mg/dL (ref 0.44–1.00)
GFR calc Af Amer: >60 mL/min (ref >60)
GFR calc non Af Amer: >60 mL/min (ref >60)
GLUCOSE: 164 mg/dL — AB (ref 65–99)
Potassium: 4.6 mmol/L (ref 3.5–5.1)
Sodium: 134 mmol/L — ABNORMAL LOW (ref 135–145)
Total Bilirubin: 0.9 mg/dL (ref 0.3–1.2)
Total Protein: 6.4 g/dL — ABNORMAL LOW (ref 6.5–8.1)

## 2014-10-25 LAB — HEMOGLOBIN AND HEMATOCRIT, BLOOD
HCT: 29.1 % — ABNORMAL LOW (ref 36.0–46.0)
Hemoglobin: 10 g/dL — ABNORMAL LOW (ref 12.0–15.0)

## 2014-10-25 LAB — CBC
HEMATOCRIT: 32.7 % — AB (ref 36.0–46.0)
HEMOGLOBIN: 11 g/dL — AB (ref 12.0–15.0)
MCH: 31 pg (ref 26.0–34.0)
MCHC: 33.6 g/dL (ref 30.0–36.0)
MCV: 92.1 fL (ref 78.0–100.0)
Platelets: 333 10*3/uL (ref 150–400)
RBC: 3.55 MIL/uL — AB (ref 3.87–5.11)
RDW: 12.8 % (ref 11.5–15.5)
WBC: 20.2 10*3/uL — AB (ref 4.0–10.5)

## 2014-10-25 LAB — I-STAT CG4 LACTIC ACID, ED: Lactic Acid, Venous: 2.63 mmol/L (ref 0.5–2.0)

## 2014-10-25 LAB — ABO/RH: ABO/RH(D): O POS

## 2014-10-25 LAB — LIPASE, BLOOD: Lipase: 10 U/L — ABNORMAL LOW (ref 22–51)

## 2014-10-25 LAB — TYPE AND SCREEN
ABO/RH(D): O POS
Antibody Screen: NEGATIVE

## 2014-10-25 LAB — URINE MICROSCOPIC-ADD ON

## 2014-10-25 LAB — PROCALCITONIN: Procalcitonin: <0.1 ng/mL

## 2014-10-25 LAB — PROTIME-INR
INR: 1.08 (ref 0.00–1.49)
Prothrombin Time: 14.2 seconds (ref 11.6–15.2)

## 2014-10-25 LAB — POC OCCULT BLOOD, ED: Fecal Occult Bld: POSITIVE — AB

## 2014-10-25 LAB — LACTIC ACID, PLASMA: Lactic Acid, Venous: 2.1 mmol/L (ref 0.5–2.0)

## 2014-10-25 LAB — MRSA PCR SCREENING: MRSA BY PCR: NEGATIVE

## 2014-10-25 MED ORDER — SODIUM CHLORIDE 0.9 % IJ SOLN
3.0000 mL | Freq: Two times a day (BID) | INTRAMUSCULAR | Status: DC
  Administered 2014-10-25 – 2014-10-29 (×4): 3 mL via INTRAVENOUS

## 2014-10-25 MED ORDER — ACETAMINOPHEN 650 MG RE SUPP: 650.0000 mg | Freq: Four times a day (QID) | RECTAL | Status: DC | PRN

## 2014-10-25 MED ORDER — ASPIRIN 81 MG PO CHEW: 81.0000 mg | CHEWABLE_TABLET | Freq: Every day | ORAL | Status: DC

## 2014-10-25 MED ORDER — DEXTROSE 5 % IV SOLN
1.0000 g | Freq: Once | INTRAVENOUS | Status: AC
  Administered 2014-10-25: 1 g via INTRAVENOUS
  Filled 2014-10-25: qty 10

## 2014-10-25 MED ORDER — PANTOPRAZOLE SODIUM 40 MG IV SOLR
40.0000 mg | Freq: Once | INTRAVENOUS | Status: AC
  Administered 2014-10-25: 40 mg via INTRAVENOUS
  Filled 2014-10-25: qty 40

## 2014-10-25 MED ORDER — LOSARTAN POTASSIUM 50 MG PO TABS
100.0000 mg | ORAL_TABLET | Freq: Every day | ORAL | Status: DC
  Administered 2014-10-26 – 2014-10-30 (×4): 100 mg via ORAL
  Filled 2014-10-25 (×4): qty 2

## 2014-10-25 MED ORDER — SODIUM CHLORIDE 0.9 % IV BOLUS (SEPSIS)
1000.0000 mL | Freq: Once | INTRAVENOUS | Status: AC
  Administered 2014-10-25: 1000 mL via INTRAVENOUS

## 2014-10-25 MED ORDER — TRAMADOL HCL 50 MG PO TABS
50.0000 mg | ORAL_TABLET | Freq: Every morning | ORAL | Status: DC
  Administered 2014-10-26 – 2014-10-29 (×4): 50 mg via ORAL
  Filled 2014-10-25 (×4): qty 1

## 2014-10-25 MED ORDER — ACETAMINOPHEN 325 MG PO TABS: 650.0000 mg | ORAL_TABLET | Freq: Four times a day (QID) | ORAL | Status: DC | PRN

## 2014-10-25 MED ORDER — ACETAMINOPHEN 325 MG PO TABS
650.0000 mg | ORAL_TABLET | Freq: Three times a day (TID) | ORAL | Status: DC
  Administered 2014-10-25 – 2014-10-30 (×15): 650 mg via ORAL
  Filled 2014-10-25 (×15): qty 2

## 2014-10-25 MED ORDER — ONDANSETRON HCL 4 MG PO TABS: 4.0000 mg | ORAL_TABLET | Freq: Four times a day (QID) | ORAL | Status: DC | PRN

## 2014-10-25 MED ORDER — MIRABEGRON ER 25 MG PO TB24
625.0000 mg | ORAL_TABLET | Freq: Every day | ORAL | Status: DC
  Filled 2014-10-25: qty 25

## 2014-10-25 MED ORDER — CIPROFLOXACIN IN D5W 400 MG/200ML IV SOLN
400.0000 mg | Freq: Two times a day (BID) | INTRAVENOUS | Status: DC
  Administered 2014-10-25 – 2014-10-30 (×10): 400 mg via INTRAVENOUS
  Filled 2014-10-25 (×10): qty 200

## 2014-10-25 MED ORDER — AMLODIPINE BESYLATE 5 MG PO TABS
5.0000 mg | ORAL_TABLET | Freq: Every day | ORAL | Status: DC
  Administered 2014-10-26 – 2014-10-30 (×4): 5 mg via ORAL
  Filled 2014-10-25 (×4): qty 1

## 2014-10-25 MED ORDER — METOPROLOL TARTRATE 25 MG PO TABS
25.0000 mg | ORAL_TABLET | Freq: Two times a day (BID) | ORAL | Status: DC
  Administered 2014-10-25 – 2014-10-30 (×10): 25 mg via ORAL
  Filled 2014-10-25 (×10): qty 1

## 2014-10-25 MED ORDER — ALBUTEROL SULFATE (2.5 MG/3ML) 0.083% IN NEBU: 3.0000 mL | INHALATION_SOLUTION | Freq: Four times a day (QID) | RESPIRATORY_TRACT | Status: DC | PRN

## 2014-10-25 MED ORDER — IOHEXOL 350 MG/ML SOLN
100.0000 mL | Freq: Once | INTRAVENOUS | Status: AC | PRN
  Administered 2014-10-25: 100 mL via INTRAVENOUS

## 2014-10-25 MED ORDER — TRAMADOL HCL 50 MG PO TABS
50.0000 mg | ORAL_TABLET | Freq: Every day | ORAL | Status: DC | PRN
  Administered 2014-10-26: 50 mg via ORAL
  Filled 2014-10-25: qty 1

## 2014-10-25 MED ORDER — VITAMIN E 180 MG (400 UNIT) PO CAPS
400.0000 [IU] | ORAL_CAPSULE | Freq: Every day | ORAL | Status: DC
  Administered 2014-10-26 – 2014-10-30 (×4): 400 [IU] via ORAL
  Filled 2014-10-25 (×6): qty 1

## 2014-10-25 MED ORDER — ONDANSETRON HCL 4 MG/2ML IJ SOLN: 4.0000 mg | Freq: Four times a day (QID) | INTRAMUSCULAR | Status: DC | PRN

## 2014-10-25 MED ORDER — IOHEXOL 300 MG/ML  SOLN
50.0000 mL | Freq: Once | INTRAMUSCULAR | Status: AC | PRN
  Administered 2014-10-25: 50 mL via ORAL

## 2014-10-25 MED ORDER — CHOLESTYRAMINE 4 G PO PACK
4.0000 g | PACK | Freq: Two times a day (BID) | ORAL | Status: DC
  Administered 2014-10-25 – 2014-10-26 (×3): 4 g via ORAL
  Filled 2014-10-25 (×4): qty 1

## 2014-10-25 MED ORDER — SODIUM CHLORIDE 0.9 % IV SOLN
INTRAVENOUS | Status: DC
  Administered 2014-10-25 – 2014-10-30 (×5): via INTRAVENOUS

## 2014-10-25 MED ORDER — FENTANYL CITRATE (PF) 100 MCG/2ML IJ SOLN
25.0000 ug | Freq: Once | INTRAMUSCULAR | Status: AC
  Administered 2014-10-25: 25 ug via INTRAVENOUS
  Filled 2014-10-25: qty 2

## 2014-10-25 MED ORDER — VITAMIN B-6 100 MG PO TABS
100.0000 mg | ORAL_TABLET | Freq: Every day | ORAL | Status: DC
  Administered 2014-10-26 – 2014-10-30 (×4): 100 mg via ORAL
  Filled 2014-10-25 (×6): qty 1

## 2014-10-25 MED ORDER — MIRABEGRON ER 25 MG PO TB24
25.0000 mg | ORAL_TABLET | Freq: Every day | ORAL | Status: DC
  Administered 2014-10-25 – 2014-10-30 (×6): 25 mg via ORAL
  Filled 2014-10-25 (×6): qty 1

## 2014-10-25 MED ORDER — VITAMIN C 500 MG PO TABS
500.0000 mg | ORAL_TABLET | Freq: Every day | ORAL | Status: DC
  Administered 2014-10-26 – 2014-10-30 (×4): 500 mg via ORAL
  Filled 2014-10-25 (×4): qty 1

## 2014-10-25 MED ORDER — VANCOMYCIN 50 MG/ML ORAL SOLUTION
500.0000 mg | Freq: Four times a day (QID) | ORAL | Status: DC
  Administered 2014-10-25 – 2014-10-27 (×8): 500 mg via ORAL
  Filled 2014-10-25 (×12): qty 10

## 2014-10-25 NOTE — H&P (Addendum)
[facility] History and Physical  Brandy Crawford TGY:[id number] DOB: 04/06/1924 DOA: 10/25/2014  Referring physician: Dr Sherwood Gambler PCP: Estill Dooms, MD   Chief Complaint:  Fall at [facility] ?lower gi bleed  HPI:  81 year old female with history of GERD, TIA, hypertension, hyperlipidemia who was sent from skilled nursing facility [facility]) after she sustained a fall out of bed this morning. Patient reports that this morning she had some abdominal discomfort and had one episode of watery dark stool. (to the ED physician. She reported having multiple loose stools). She was trying to reach for a call bell and fell off the bed. She sustained a laceration in her left forehead. Denies losing consciousness, headache or dizziness. She denies any nausea or vomiting, fevers, chills, chest pain, palpitations or shortness of breath or dysuria. She has history of urinary incontinence. Denies change in weight or appetite.  Course in the ED Patient was tachycardic, afebrile with normal respiratory rate and O2 sat on room air. Blood work done showed marked leukocytosis with WBC of 20.2 k, hemoglobin of 11 (drop from her baseline of 12-14), platelets. Stool for occult blood was positive. Evidence of metabolic panel showed sodium of 134, glucose of 164 and BUN of 28. Lactic acid was elevated at 2.63. Lipase was normal. UA was positive for UTI. CT of the head done was negative for acute injury or intracerebral bleed. CT angiogram of the abdomen and pelvis showed no obvious signs of recent ischemia and showed diverticulosis. Patient given 2 L IV normal saline bolus and hospitalists admission requested.  Review of Systems:  Constitutional: Denies fever, chills, diaphoresis, appetite change and fatigue.  HEENT: Denies eye pain, redness, hearing loss, ear pain, congestion, sore throat, rhinorrhea, sneezing, mouth sores, trouble swallowing, neck pain, neck stiffness and  tinnitus.   Respiratory: Denies SOB, DOE, cough, chest tightness,  and wheezing.   Cardiovascular: Denies chest pain, palpitations and leg swelling.  Gastrointestinal: Abdominal pain, blood in stool,  loose bowels, Denies nausea, vomiting,  constipation, and abdominal distention.  Genitourinary: Denies dysuria, urgency, frequency, hematuria, flank pain and difficulty urinating.  has overflow incontinence at baseline Endocrine: Denies: hot or cold intolerance, , polyuria, polydipsia. Musculoskeletal: Denies myalgias, back pain, joint pain or swelling Skin: Denies pallor, rash and wound.  Neurological: Denies dizziness, seizures, syncope, weakness, light-headedness, numbness and headaches.  Psychiatric/Behavioral: Denies confusion  Past Medical History  Diagnosis Date  . Asthma   . GERD (gastroesophageal reflux disease)   . Hypertension   . Osteoporosis   . TIA (transient ischemic attack) 09/20/2006  . Hyperlipemia   . Cervical disc disease   . Anemia   . Depression   . Lumbosacral spondylosis 10/28/2009  . Scoliosis    Past Surgical History  Procedure Laterality Date  . Appendectomy    . Abdominal hysterectomy    . Tonsillectomy    . Cholecystectomy     Social History:  reports that she has never smoked. She has never used smokeless tobacco. She reports that she does not drink alcohol or use illicit drugs.  Allergies  Allergen Reactions  . Amoxicillin     Per MAR  . Oxycodone Other (See Comments)    Per MAR   . Sulfamethoxazole     Per MAR   . Penicillins Rash    Family History  Problem Relation Age of Onset  . Pancreatic cancer Father 45    deceased  . Heart failure Mother 104    deceased  .  Leukemia Brother 54    deceased  . Diabetes type II    . Alcohol abuse      Prior to Admission medications   Medication Sig Start Date End Date Taking? Authorizing Provider  acetaminophen (TYLENOL) 325 MG tablet Take 650 mg by mouth 3 (three) times daily. Also take 650mg   for fever >101 x1 dose. And notify MD   Yes Historical Provider, MD  albuterol (PROVENTIL HFA;VENTOLIN HFA) 108 (90 BASE) MCG/ACT inhaler Inhale 2 puffs into the lungs every 6 (six) hours as needed for wheezing or shortness of breath. 02/19/14  Yes Marin Olp, MD  amLODipine (NORVASC) 5 MG tablet Take 5 mg by mouth daily with breakfast.    Yes Historical Provider, MD  Ascorbic Acid (VITAMIN C) 500 MG tablet Take 500 mg by mouth daily with breakfast.    Yes Historical Provider, MD  aspirin 81 MG chewable tablet Chew 81 mg by mouth daily with breakfast.    Yes Historical Provider, MD  cholestyramine Lucrezia Starch) 4 G packet Take 4 g by mouth 2 (two) times daily.   Yes Historical Provider, MD  loperamide (IMODIUM) 2 MG capsule Take 2-4 mg by mouth as needed for diarrhea or loose stools. For the initial dose take mg, and then take 2mg  after each loose stool for 48 hours max.   Yes Historical Provider, MD  losartan (COZAAR) 100 MG tablet Take 100 mg by mouth daily with breakfast.    Yes Historical Provider, MD  Mirabegron ER (MYRBETRIQ) 25 MG TB24 Take 25 tablets by mouth daily with breakfast. Prescribed by urology   Yes Historical Provider, MD  pyridOXINE (VITAMIN B-6) 100 MG tablet Take 100 mg by mouth daily with breakfast.    Yes Historical Provider, MD  traMADol (ULTRAM) 50 MG tablet Take 1 tablet (50 mg total) by mouth 2 (two) times daily as needed for moderate pain (schedule morning dose with breakfast, evening dose prn). 02/19/14  Yes Marin Olp, MD  vitamin E (VITAMIN E) 400 UNIT capsule Take 400 Units by mouth daily with breakfast.    Yes Historical Provider, MD     Physical Exam:  Filed Vitals:   10/25/14 0731 10/25/14 0810 10/25/14 0949 10/25/14 1053  BP: 147/67 147/67 145/92 151/84  Pulse: 85 86 101 101  Temp: 98.1 F (36.7 C) 97.5 F (36.4 C)    TempSrc: Oral Oral    Resp: 23 20 23 24   Height:      Weight:      SpO2: 100% 100% 100% 100%    Constitutional: Vital signs  reviewed.  Elderly female lying in bed in no acute distress. HEENT: no pallor, no icterus, moist oral mucosa, no cervical lymphadenopathy Cardiovascular: RRR, S1 normal, S2 normal, no MRG Chest: CTAB, no wheezes, rales, or rhonchi Abdominal: Soft. Minimal diffuse distention with mild LLQ tenderness, BS+ Ext: warm, no edema Neurological: A&O x3, non focal  Labs on Admission:  Basic Metabolic Panel:  Recent Labs Lab 10/25/14 0714  NA 134*  K 4.6  CL 105  CO2 23  GLUCOSE 164*  BUN 28*  CREATININE 0.67  CALCIUM 9.5   Liver Function Tests:  Recent Labs Lab 10/25/14 0714  AST 23  ALT 22  ALKPHOS 55  BILITOT 0.9  PROT 6.4*  ALBUMIN 3.7    Recent Labs Lab 10/25/14 0731  LIPASE 10*   No results for input(s): AMMONIA in the last 168 hours. CBC:  Recent Labs Lab 10/25/14 0714  WBC 20.2*  NEUTROABS  19.1*  HGB 11.0*  HCT 32.7*  MCV 92.1  PLT 333   Cardiac Enzymes: No results for input(s): CKTOTAL, CKMB, CKMBINDEX, TROPONINI in the last 168 hours. BNP: Invalid input(s): POCBNP CBG: No results for input(s): GLUCAP in the last 168 hours.  Radiological Exams on Admission: Ct Head Wo Contrast  10/25/2014   CLINICAL DATA:  Head trauma with scalp laceration secondary to a fall today in her room.  EXAM: CT HEAD WITHOUT CONTRAST  TECHNIQUE: Contiguous axial images were obtained from the base of the skull through the vertex without intravenous contrast.  COMPARISON:  CT scan dated 04/28/2005 and brain MR dated 07/01/2005  FINDINGS: No mass lesion. No midline shift. No acute hemorrhage or hematoma. No extra-axial fluid collections. No evidence of acute infarction. There are scattered areas of periventricular white matter lucency consistent with chronic small vessel ischemic disease. There is diffuse cerebral cortical atrophy with secondary ventricular dilatation, slightly progressed since 2007.  No acute osseous abnormality. Marked degenerative changes at the articulation of  the odontoid with the anterior arch of C1.  IMPRESSION: No acute intracranial abnormality. Atrophy with chronic small vessel ischemic disease, slightly progressed since 2007.   Electronically Signed   By: Lorriane Shire M.D.   On: 10/25/2014 08:42   Ct Angio Abd/pel W/ And/or W/o  10/25/2014   CLINICAL DATA:  Right lower quadrant pain and dark stools.  EXAM: CTA ABDOMEN AND PELVIS WITHOUT AND WITH CONTRAST  TECHNIQUE: Multidetector CT imaging of the abdomen and pelvis was performed using the standard protocol during bolus administration of intravenous contrast. Multiplanar reconstructed images and MIPs were obtained and reviewed to evaluate the vascular anatomy.  CONTRAST:  175mL OMNIPAQUE IOHEXOL 350 MG/ML SOLN  COMPARISON:  CT thorax 01/13/2014  FINDINGS: Lower chest: Linear bandlike Atelectasis in the inferior lingula and left lower lobe is similar prior.  Hepatobiliary: No focal hepatic lesion. Postcholecystectomy. No biliary dilatation. Common bile duct is normal caliber through the pancreatic head.  Pancreas: Is atrophy of the pancreas.  Clear ductal dilatation.  Spleen: Normal spleen  Adrenals/urinary tract: Adrenal glands and kidneys are normal. The ureters and bladder normal.  Stomach/Bowel: Stomach, small-bowel, and cecum are normal. The colon is collapsed from the transverse to the descending colon. No obstructing lesion identified. Diverticula of sigmoid colon without acute inflammation.  Vascular/Lymphatic: Motion degradation of the upper abdomen. CTA examination aorta demonstrates minimal atherosclerotic calcification. The SMA and celiac trunk are widely patent. There is narrowing of the proximal celiac trunk with post stenotic dilatation seen best on sagittal image 86, series 11. The branches of study trunk are patent. The branches of the celiac trunk are patent. SMA is patent distally. The IMA is patent.  There bilateral single renal arteries. There is calcification the ostia of the renal  arteries. Iliac arteries are normal. No aneurysm. Abdominal aorta is normal caliber. There is no retroperitoneal or periportal lymphadenopathy. No pelvic lymphadenopathy.  Reproductive: Post hysterectomy  Musculoskeletal: No aggressive osseous lesion. Severe degenerate change in the lumbar spine with endplate and facet hypertrophy.  Other: There is haziness to the upper abdominal mesentery beneath the pancreas. There is motion degradation through this region (image 66, series 4.  Review of the MIP images confirms the above findings.  IMPRESSION: 1. Motion degradation through the upper abdomen. The branches of the SMA and celiac trunk are widely patent. There is poststenotic dilatation of the celiac trunk. 2. Haziness of the mesentery inferior to the pancreas. This is not specific and has more  of a pattern of pancreatitis than mesenteric ischemia. 3. Diverticulosis of the sigmoid colon without evidence of acute diverticulitis.   Electronically Signed   By: Suzy Bouchard M.D.   On: 10/25/2014 09:42    EKG: Sinus rhythm at 86, no ST-T changes  Assessment/Plan  Principal Problem:   Sepsis Possibly secondary to enteritis vs UTI Admit to telemetry. -We will cover empirically with IV ciprofloxacin and by mouth vancomycin for possible C. difficile colitis. -Check blood cultures, urine culture and stool for C. Difficile and GI pathogen panel. -Patient given IV normal saline bolus in the ED. Continue on maintenance fluid and repeat lactic acid. Monitor WBC. Sepsis pathway initiated in the ED. Check pro calcitonin level.  Active Problems: Lower GI bleed Possibly diverticular versus secondary to infection.. Mild drop in H&h from baseline. CT abdomen and pelvis unremarkable for recent ischemia. Will place on clear liquid. Monitor H&H. Hold off on  GI evaluation at this time.  Fall at  nursing home Appears to be mechanical. Check orthostasis. Monitor on telemetry.  History of TIA Hold  aspirin.       Overactive bladder        Diet:clears  DVT prophylaxis: SCD   Code Status: DNR Family Communication: None at bedside Disposition Plan: Admit to tele  Louellen Molder [facility] Pager [phone]  Total time spent on admission :70 minutes  If 7PM-7AM, please contact night-coverage [email or web address] Password [facility] 10/25/2014, 10:59 AM

## 2014-10-25 NOTE — ED Notes (Signed)
Admitting MD at bedside.

## 2014-10-25 NOTE — Progress Notes (Signed)
CRITICAL VALUE ALERT  Critical value received:  Lactic acid  Date of notification:  7/14  Time of notification:  961  Critical value read back:yes  Nurse who received alert:  Wess Botts  MD notified (1st page):  Dhungel  Time of first page:    MD notified (2nd page):  Time of second page:  Responding MD:    Time MD responded:

## 2014-10-25 NOTE — ED Notes (Signed)
Pt from [facility] had an unwitnessed fall. She states she did not have a LOC. She immediately called for help.

## 2014-10-25 NOTE — ED Notes (Addendum)
Pt from [facility]. She fell reaching for her call bell and hit her head. She reports she did not experience a LOC. Her pupils are reactive and she is Alert and oriented x 4. Pt states when she fell she had a bowel movement that was dark and tarry. She reports taking a medication for diarrhea last night, but she is unsure what it was. Pt is currently reporting pain in her abdomen; in the lower right quadrant. Pt presents with blood on her head, but there is no active bleeding, and the only identifiable injury to her head is a laceration of about 1 inch on the top right portion of her forehead.

## 2014-10-25 NOTE — ED Provider Notes (Signed)
CSN: [id number]     Arrival date & time 10/25/14  6948 History   First MD Initiated Contact with Patient 10/25/14 [phone]     Chief Complaint  Patient presents with  . Fall     (Consider location/radiation/quality/duration/timing/severity/associated sxs/prior Treatment) HPI  81 year old female presents after a fall out of bed this morning. The patient comes from an assisted living facility. She is able to fully provide the history. Patient states that this morning a couple hours ago she started having abdominal pain and black stools. She has not felt lightheaded or dizzy. She states that there have been multiple of the stools. She was trying to reach for her call bell and fell off the bed. She hit her head on the floor. She did not lose consciousness and does not currently have a headache. Patient sustained a small laceration to her left for head. Patient states her tetanus shot is up-to-date within the last 5 years. Patient did not get lightheaded or dizzy when falling, she simply slipped while trying to reach with the call bell. Not on any blood thinners, no prior GI bleeding.  Past Medical History  Diagnosis Date  . Asthma   . GERD (gastroesophageal reflux disease)   . Hypertension   . Osteoporosis   . TIA (transient ischemic attack) 09/20/2006  . Hyperlipemia   . Cervical disc disease   . Anemia   . Depression   . Lumbosacral spondylosis 10/28/2009  . Scoliosis    Past Surgical History  Procedure Laterality Date  . Appendectomy    . Abdominal hysterectomy    . Tonsillectomy    . Cholecystectomy     Family History  Problem Relation Age of Onset  . Pancreatic cancer Father 55    deceased  . Heart failure Mother 104    deceased  . Leukemia Brother 28    deceased  . Diabetes type II    . Alcohol abuse     History  Substance Use Topics  . Smoking status: Never Smoker   . Smokeless tobacco: Never Used  . Alcohol Use: No   OB History    No data available     Review of  Systems  Respiratory: Negative for shortness of breath.   Cardiovascular: Negative for chest pain.  Gastrointestinal: Positive for abdominal pain and diarrhea. Negative for vomiting.  Genitourinary: Negative for dysuria.  Skin: Positive for wound.  Neurological: Negative for dizziness, syncope, light-headedness and headaches.  All other systems reviewed and are negative.     Allergies  Amoxicillin; Oxycodone; Sulfamethoxazole; and Penicillins  Home Medications   Prior to Admission medications   Medication Sig Start Date End Date Taking? Authorizing Provider  acetaminophen (TYLENOL) 325 MG tablet Take 650 mg by mouth 3 (three) times daily.    Historical Provider, MD  albuterol (PROVENTIL HFA;VENTOLIN HFA) 108 (90 BASE) MCG/ACT inhaler Inhale 2 puffs into the lungs every 6 (six) hours as needed for wheezing or shortness of breath. 02/19/14   Marin Olp, MD  amLODipine (NORVASC) 5 MG tablet Take 5 mg by mouth daily with breakfast.     Historical Provider, MD  Ascorbic Acid (VITAMIN C) 500 MG tablet Take 500 mg by mouth daily with breakfast.     Historical Provider, MD  aspirin 81 MG chewable tablet Chew 81 mg by mouth daily with breakfast.     Historical Provider, MD  losartan (COZAAR) 100 MG tablet Take 100 mg by mouth daily with breakfast.     Historical  Provider, MD  Mirabegron ER (MYRBETRIQ) 25 MG TB24 Take 25 tablets by mouth daily with breakfast. Prescribed by urology    Historical Provider, MD  omeprazole (PRILOSEC) 20 MG capsule Take 20 mg by mouth daily with breakfast.    Historical Provider, MD  pyridOXINE (VITAMIN B-6) 100 MG tablet Take 100 mg by mouth daily with breakfast.     Historical Provider, MD  traMADol (ULTRAM) 50 MG tablet Take 1 tablet (50 mg total) by mouth 2 (two) times daily as needed for moderate pain (schedule morning dose with breakfast, evening dose prn). 02/19/14   Marin Olp, MD  vitamin E (VITAMIN E) 400 UNIT capsule Take 400 Units by mouth daily  with breakfast.     Historical Provider, MD   BP 155/66 mmHg  Pulse 89  Temp(Src) 97.6 F (36.4 C) (Oral)  Resp 22  Ht 5\' 6"  (1.676 m)  Wt 150 lb (68.04 kg)  BMI 24.22 kg/m2  SpO2 99% Physical Exam  Constitutional: She is oriented to person, place, and time. She appears well-developed and well-nourished. No distress.  HENT:  Head: Normocephalic.    Right Ear: External ear normal.  Left Ear: External ear normal.  Nose: Nose normal.  Eyes: Right eye exhibits no discharge. Left eye exhibits no discharge.  Cardiovascular: Normal rate, regular rhythm and normal heart sounds.   Pulmonary/Chest: Effort normal and breath sounds normal.  Abdominal: Soft. There is tenderness in the right upper quadrant and right lower quadrant.  RUQ > RLQ in tenderness  Genitourinary:  Nurse performed rectal exam prior to me seeing patient. Dark (but not black) stool that is heme-positive  Neurological: She is alert and oriented to person, place, and time.  CN 2-12 grossly intact. 5/5 strength in all 4 extremities. Grossly normal sensation  Skin: Skin is warm and dry.  Nursing note and vitals reviewed.   ED Course  LACERATION REPAIR Date/Time: 10/25/2014 9:10 AM Performed by: Sherwood Gambler Authorized by: Sherwood Gambler Consent: Verbal consent obtained. Risks and benefits: risks, benefits and alternatives were discussed Consent given by: patient Body area: head/neck Location details: forehead Laceration length: 1 cm Foreign bodies: no foreign bodies Tendon involvement: none Nerve involvement: none Vascular damage: no Preparation: Patient was prepped and draped in the usual sterile fashion. Irrigation solution: saline Irrigation method: syringe Skin closure: glue Technique: simple Approximation: close Approximation difficulty: simple Dressing: 4x4 sterile gauze and antibiotic ointment Patient tolerance: Patient tolerated the procedure well with no immediate complications   (including  critical care time) Labs Review Labs Reviewed  COMPREHENSIVE METABOLIC PANEL - Abnormal; Notable for the following:    Sodium 134 (*)    Glucose, Bld 164 (*)    BUN 28 (*)    Total Protein 6.4 (*)    All other components within normal limits  CBC - Abnormal; Notable for the following:    WBC 20.2 (*)    RBC 3.55 (*)    Hemoglobin 11.0 (*)    HCT 32.7 (*)    All other components within normal limits  LIPASE, BLOOD - Abnormal; Notable for the following:    Lipase 10 (*)    All other components within normal limits  DIFFERENTIAL - Abnormal; Notable for the following:    Neutrophils Relative % 93 (*)    Neutro Abs 19.1 (*)    Lymphocytes Relative 2 (*)    Lymphs Abs 0.5 (*)    All other components within normal limits  POC OCCULT BLOOD, ED - Abnormal; Notable  for the following:    Fecal Occult Bld POSITIVE (*)    All other components within normal limits  I-STAT CG4 LACTIC ACID, ED - Abnormal; Notable for the following:    Lactic Acid, Venous 2.63 (*)    All other components within normal limits  PROTIME-INR  URINALYSIS, ROUTINE W REFLEX MICROSCOPIC (NOT AT [facility])  TYPE AND SCREEN  ABO/RH    Imaging Review Ct Head Wo Contrast  10/25/2014   CLINICAL DATA:  Head trauma with scalp laceration secondary to a fall today in her room.  EXAM: CT HEAD WITHOUT CONTRAST  TECHNIQUE: Contiguous axial images were obtained from the base of the skull through the vertex without intravenous contrast.  COMPARISON:  CT scan dated 04/28/2005 and brain MR dated 07/01/2005  FINDINGS: No mass lesion. No midline shift. No acute hemorrhage or hematoma. No extra-axial fluid collections. No evidence of acute infarction. There are scattered areas of periventricular white matter lucency consistent with chronic small vessel ischemic disease. There is diffuse cerebral cortical atrophy with secondary ventricular dilatation, slightly progressed since 2007.  No acute osseous abnormality. Marked degenerative changes at  the articulation of the odontoid with the anterior arch of C1.  IMPRESSION: No acute intracranial abnormality. Atrophy with chronic small vessel ischemic disease, slightly progressed since 2007.   Electronically Signed   By: Lorriane Shire M.D.   On: 10/25/2014 08:42   Ct Angio Abd/pel W/ And/or W/o  10/25/2014   CLINICAL DATA:  Right lower quadrant pain and dark stools.  EXAM: CTA ABDOMEN AND PELVIS WITHOUT AND WITH CONTRAST  TECHNIQUE: Multidetector CT imaging of the abdomen and pelvis was performed using the standard protocol during bolus administration of intravenous contrast. Multiplanar reconstructed images and MIPs were obtained and reviewed to evaluate the vascular anatomy.  CONTRAST:  173mL OMNIPAQUE IOHEXOL 350 MG/ML SOLN  COMPARISON:  CT thorax 01/13/2014  FINDINGS: Lower chest: Linear bandlike Atelectasis in the inferior lingula and left lower lobe is similar prior.  Hepatobiliary: No focal hepatic lesion. Postcholecystectomy. No biliary dilatation. Common bile duct is normal caliber through the pancreatic head.  Pancreas: Is atrophy of the pancreas.  Clear ductal dilatation.  Spleen: Normal spleen  Adrenals/urinary tract: Adrenal glands and kidneys are normal. The ureters and bladder normal.  Stomach/Bowel: Stomach, small-bowel, and cecum are normal. The colon is collapsed from the transverse to the descending colon. No obstructing lesion identified. Diverticula of sigmoid colon without acute inflammation.  Vascular/Lymphatic: Motion degradation of the upper abdomen. CTA examination aorta demonstrates minimal atherosclerotic calcification. The SMA and celiac trunk are widely patent. There is narrowing of the proximal celiac trunk with post stenotic dilatation seen best on sagittal image 86, series 11. The branches of study trunk are patent. The branches of the celiac trunk are patent. SMA is patent distally. The IMA is patent.  There bilateral single renal arteries. There is calcification the ostia  of the renal arteries. Iliac arteries are normal. No aneurysm. Abdominal aorta is normal caliber. There is no retroperitoneal or periportal lymphadenopathy. No pelvic lymphadenopathy.  Reproductive: Post hysterectomy  Musculoskeletal: No aggressive osseous lesion. Severe degenerate change in the lumbar spine with endplate and facet hypertrophy.  Other: There is haziness to the upper abdominal mesentery beneath the pancreas. There is motion degradation through this region (image 66, series 4.  Review of the MIP images confirms the above findings.  IMPRESSION: 1. Motion degradation through the upper abdomen. The branches of the SMA and celiac trunk are widely patent. There is poststenotic  dilatation of the celiac trunk. 2. Haziness of the mesentery inferior to the pancreas. This is not specific and has more of a pattern of pancreatitis than mesenteric ischemia. 3. Diverticulosis of the sigmoid colon without evidence of acute diverticulitis.   Electronically Signed   By: Suzy Bouchard M.D.   On: 10/25/2014 09:42     EKG Interpretation   Date/Time:  Thursday October 25 2014 07:52:15 EDT Ventricular Rate:  86 PR Interval:  166 QRS Duration: 99 QT Interval:  386 QTC Calculation: 462 R Axis:   34 Text Interpretation:  Sinus rhythm Probable left atrial enlargement  Minimal ST depression, lateral leads Baseline wander in lead(s) V4 no  significant change since Oct 2015 Confirmed by Homer Pfeifer  MD, Preesha Benjamin ([id number])  on 10/25/2014 7:56:06 AM      MDM   Final diagnoses:  Right sided abdominal pain  UTI (lower urinary tract infection)    Patient's pain is better after small dose of fentanyl. CT shows no obvious area of ischemia but imaging is limited. She is not so uncomfortable or rolling around that ischemia is high on the differential. Urine is concerning for a UTI although she is currently asymptomatic. At this point will treat with Rocephin, IV fluids, and admit to the hospitalist for monitoring for  serial hemoglobins and abdominal exams. At this point she is stable, stable for admission to floor.    Sherwood Gambler, MD 10/25/14 [phone]

## 2014-10-25 NOTE — ED Notes (Signed)
Pt can go to floor at 10:25...kjl

## 2014-10-25 NOTE — Progress Notes (Signed)
CSW received call from Dia Crawford, at [facility] sharing that patient is a resident in [place].   Belia Heman, [place] [employer]  [employer] [phone]

## 2014-10-25 NOTE — ED Notes (Signed)
Bed: DF17 Expected date: 10/25/14 Expected time: 6:15 AM Means of arrival: Ambulance Comments: Fall head injury

## 2014-10-26 DIAGNOSIS — D62 Acute posthemorrhagic anemia: Secondary | ICD-10-CM

## 2014-10-26 DIAGNOSIS — K921 Melena: Secondary | ICD-10-CM

## 2014-10-26 LAB — CBC
HCT: 26.1 % — ABNORMAL LOW (ref 36.0–46.0)
Hemoglobin: 8.8 g/dL — ABNORMAL LOW (ref 12.0–15.0)
MCH: 32.1 pg (ref 26.0–34.0)
MCHC: 33.7 g/dL (ref 30.0–36.0)
MCV: 95.3 fL (ref 78.0–100.0)
PLATELETS: 285 10*3/uL (ref 150–400)
RBC: 2.74 MIL/uL — ABNORMAL LOW (ref 3.87–5.11)
RDW: 13.6 % (ref 11.5–15.5)
WBC: 9.2 10*3/uL (ref 4.0–10.5)

## 2014-10-26 LAB — BASIC METABOLIC PANEL
ANION GAP: 6 (ref 5–15)
BUN: 13 mg/dL (ref 6–20)
CALCIUM: 9 mg/dL (ref 8.9–10.3)
CHLORIDE: 110 mmol/L (ref 101–111)
CO2: 24 mmol/L (ref 22–32)
Creatinine, Ser: 0.66 mg/dL (ref 0.44–1.00)
GFR calc Af Amer: >60 mL/min (ref >60)
GFR calc non Af Amer: >60 mL/min (ref >60)
GLUCOSE: 100 mg/dL — AB (ref 65–99)
Potassium: 3.7 mmol/L (ref 3.5–5.1)
Sodium: 140 mmol/L (ref 135–145)

## 2014-10-26 LAB — HEMOGLOBIN AND HEMATOCRIT, BLOOD
HCT: 26.2 % — ABNORMAL LOW (ref 36.0–46.0)
Hemoglobin: 8.9 g/dL — ABNORMAL LOW (ref 12.0–15.0)

## 2014-10-26 MED ORDER — PANTOPRAZOLE SODIUM 40 MG PO TBEC
40.0000 mg | DELAYED_RELEASE_TABLET | Freq: Every day | ORAL | Status: DC
  Administered 2014-10-26 – 2014-10-28 (×3): 40 mg via ORAL
  Filled 2014-10-26 (×3): qty 1

## 2014-10-26 NOTE — Clinical Social Work Note (Signed)
Clinical Social Work Assessment  Patient Details  Name: Brandy Crawford MRN: [id number] Date of Birth: 03-23-1924  Date of referral:  10/26/14               Reason for consult:  Facility Placement                Permission sought to share information with:  Facility Sport and exercise psychologist, Family Supports Permission granted to share information::  Yes, Release of Information Signed  Name::     Delfin Gant  Agency::  [facility] [employer] [facility]   Relationship::  Daughter  Contact Information:  [phone]  Housing/Transportation Living arrangements for the past 2 months:  [place], [place] ([place]) Source of Information:  Patient, Adult Children Patient Interpreter Needed:  None Criminal Activity/Legal Involvement Pertinent to Current Situation/Hospitalization:  No - Comment as needed Significant Relationships:  Adult Children Lives with:  Facility Resident Do you feel safe going back to the place where you live?  Yes Need for family participation in patient care:  Yes (Comment)  Care giving concerns:  Patient current resident in assisted living at [facility]. Patient recommended for skilled nursing at this time.     Social Worker assessment / plan:  CSW met with pt at bedside to complete psychosocial assessment. Pt shared she is a resident at [facility] assisted living but is needed skilled nursing care for further rehab. CSW and pt discussed that [facility] is able to offer a semi private room paying privately at this time. Pt open to [facility] under medicare or outside skilled nursing facilities. Patient asked csw to speak with daughter, Delfin Gant. CSW spoke with pt daughter who is also interested in skilled nursing for [facility] within [facility] but is open to outside facilities if absolutely necessary. Pt and pt daughter both agreed to [facility].   Employment status:  Retired Forensic scientist:  Medicare PT Recommendations:  [place] / Referral to community resources:     Patient/Family's Response to care:  Patient and pt family are disappointed that [facility] may not be an option but hoepful for other options at [place] or outside facilities.   Patient/Family's Understanding of and Emotional Response to Diagnosis, Current Treatment, and Prognosis:  Pt and pt daughter understanding that patient is needing more assistance due to decline secondary to treatment and weakness.   Emotional Assessment Appearance:  Appears stated age Attitude/Demeanor/Rapport:   (cotent and calm ) Affect (typically observed):  Accepting Orientation:    Alcohol / Substance use:  Not Applicable Psych involvement (Current and /or in the community):  No (Comment)  Discharge Needs  Concerns to be addressed:  Discharge Planning Concerns Readmission within the last 30 days:  No Current discharge risk:  None Barriers to Discharge:  Barriers Resolved   Nataliyah Packham, [place], LCSW 10/26/2014, 3:32 PM

## 2014-10-26 NOTE — Progress Notes (Signed)
[facility] PROGRESS NOTE  Brandy Crawford VOH:[id number] DOB: Jun 10, 1923 DOA: 10/25/2014 PCP: Estill Dooms, MD Brief narrative 81 year old female with history of GERD, TIA, hypertension, hyperlipidemia sent from skilled nursing facility after sustaining a fall out of bed which appears to be mechanical. She was also having some dark stools at the facility. Patient was septic on presentation with significant leukocytosis and tachycardic with elevated lactate. UA was positive for UTI and patient had abdominal discomfort. CT of the abdomen and pelvis was unremarkable except for diverticulosis. Patient having several episodes of rectal bleed mixed with stool after being admitted to telemetry. [facility] following in consultation.  Assessment/Plan: Sepsis Possibly related to UTI versus enteritis. Currently resolved. Monitor on telemetry. Covering empirically with ciprofloxacin and oral vancomycin for possible C. difficile enteritis. Stool for C. difficile and GI pathogen panel still pending. Follow blood and urine culture. Lactic acid improved. Monitor with IV hydration.  GI bleed Possibly diverticular. Has further drop in H&H this morning. Seen by [facility] and recommend monitor on clear liquid. Will plan on EGD versus colonoscopy if bleeding persist or has further drop in H&H. Added PPI. Continued aspirin. Avoid NSAIDs.  Fall at nursing home Appears to be mechanical. Stable on telemetry.  History of TIA Holding aspirin  DVT prophylaxis: SCDs  Diet: Clears  CODE STATUS: DO NOT RESUSCITATE Family communication: None at bedside Disposition: Currently inpatient. Return to SNF once clinically stable.  HPI/Subjective: Patient seen and examined. Had another episode of rectal bleed next with stool. Denies further abdominal pain. Denies dizziness, chest pain or shortness of breath.  Objective: Filed Vitals:   10/26/14 0953  BP: 142/62  Pulse: 84  Temp:   Resp:     Intake/Output  Summary (Last 24 hours) at 10/26/14 1150 Last data filed at 10/26/14 0948  Gross per 24 hour  Intake 2286.25 ml  Output      1 ml  Net 2285.25 ml   Filed Weights   10/25/14 0634 10/25/14 1119  Weight: 68.04 kg (150 lb) 67.4 kg (148 lb 9.4 oz)    Exam:   General:  Elderly female in no acute distress  HEENT: Pallor present, moist oral mucosa, supple neck  Chest: Clear to auscultation bilaterally, no added sounds  CVS: Normal S1 and S2, no murmurs rub or gallop  GI: Soft, nondistended, nontender, bowel sounds present  Musculoskeletal musculoskeletal: Warm, no edema  CNS: Alert and oriented    Data Reviewed: Basic Metabolic Panel:  Recent Labs Lab 10/23/14 10/25/14 0714 10/26/14 0522  NA 141 134* 140  K 4.3 4.6 3.7  CL  --  105 110  CO2  --  23 24  GLUCOSE  --  164* 100*  BUN 15 28* 13  CREATININE 0.6 0.67 0.66  CALCIUM  --  9.5 9.0   Liver Function Tests:  Recent Labs Lab 10/23/14 10/25/14 0714  AST 15 23  ALT 16 22  ALKPHOS 58 55  BILITOT  --  0.9  PROT  --  6.4*  ALBUMIN  --  3.7    Recent Labs Lab 10/25/14 0731  LIPASE 10*   No results for input(s): AMMONIA in the last 168 hours. CBC:  Recent Labs Lab 10/23/14 10/25/14 0714 10/25/14 1135 10/26/14 0522  WBC 7.8 20.2*  --  9.2  NEUTROABS  --  19.1*  --   --   HGB 12.5 11.0* 10.0* 8.8*  HCT 37 32.7* 29.1* 26.1*  MCV  --  92.1  --  95.3  PLT 323 333  --  285   Cardiac Enzymes: No results for input(s): CKTOTAL, CKMB, CKMBINDEX, TROPONINI in the last 168 hours. BNP (last 3 results) No results for input(s): BNP in the last 8760 hours.  ProBNP (last 3 results) No results for input(s): PROBNP in the last 8760 hours.  CBG: No results for input(s): GLUCAP in the last 168 hours.  Recent Results (from the past 240 hour(s))  MRSA PCR Screening     Status: None   Collection Time: 10/25/14 12:31 PM  Result Value Ref Range Status   MRSA by PCR NEGATIVE NEGATIVE Final    Comment:         The GeneXpert MRSA Assay (FDA approved for NASAL specimens only), is one component of a comprehensive MRSA colonization surveillance program. It is not intended to diagnose MRSA infection nor to guide or monitor treatment for MRSA infections.   Urine culture     Status: None (Preliminary result)   Collection Time: 10/25/14  3:12 PM  Result Value Ref Range Status   Specimen Description URINE, CLEAN CATCH  Final   Special Requests NONE  Final   Culture   Final    NO GROWTH < 24 HOURS Performed at [facility]    Report Status PENDING  Incomplete     Studies: Ct Head Wo Contrast  10/25/2014   CLINICAL DATA:  Head trauma with scalp laceration secondary to a fall today in her room.  EXAM: CT HEAD WITHOUT CONTRAST  TECHNIQUE: Contiguous axial images were obtained from the base of the skull through the vertex without intravenous contrast.  COMPARISON:  CT scan dated 04/28/2005 and brain MR dated 07/01/2005  FINDINGS: No mass lesion. No midline shift. No acute hemorrhage or hematoma. No extra-axial fluid collections. No evidence of acute infarction. There are scattered areas of periventricular white matter lucency consistent with chronic small vessel ischemic disease. There is diffuse cerebral cortical atrophy with secondary ventricular dilatation, slightly progressed since 2007.  No acute osseous abnormality. Marked degenerative changes at the articulation of the odontoid with the anterior arch of C1.  IMPRESSION: No acute intracranial abnormality. Atrophy with chronic small vessel ischemic disease, slightly progressed since 2007.   Electronically Signed   By: Lorriane Shire M.D.   On: 10/25/2014 08:42   Dg Chest Port 1 View  10/25/2014   CLINICAL DATA:  Recent fall from bed with chest pain and weakness  EXAM: PORTABLE CHEST - 1 VIEW  COMPARISON:  01/13/2014  FINDINGS: Cardiac shadow is mildly enlarged in size. A small hiatal hernia is noted. Aortic calcifications are again seen. The  lungs are well-aerated without focal infiltrate or sizable effusion.  IMPRESSION: Aortic atherosclerotic change.  No acute abnormality is seen.  Small hiatal hernia.   Electronically Signed   By: Inez Catalina M.D.   On: 10/25/2014 11:57   Ct Angio Abd/pel W/ And/or W/o  10/25/2014   CLINICAL DATA:  Right lower quadrant pain and dark stools.  EXAM: CTA ABDOMEN AND PELVIS WITHOUT AND WITH CONTRAST  TECHNIQUE: Multidetector CT imaging of the abdomen and pelvis was performed using the standard protocol during bolus administration of intravenous contrast. Multiplanar reconstructed images and MIPs were obtained and reviewed to evaluate the vascular anatomy.  CONTRAST:  166mL OMNIPAQUE IOHEXOL 350 MG/ML SOLN  COMPARISON:  CT thorax 01/13/2014  FINDINGS: Lower chest: Linear bandlike Atelectasis in the inferior lingula and left lower lobe is similar prior.  Hepatobiliary: No focal hepatic lesion. Postcholecystectomy. No  biliary dilatation. Common bile duct is normal caliber through the pancreatic head.  Pancreas: Is atrophy of the pancreas.  Clear ductal dilatation.  Spleen: Normal spleen  Adrenals/urinary tract: Adrenal glands and kidneys are normal. The ureters and bladder normal.  Stomach/Bowel: Stomach, small-bowel, and cecum are normal. The colon is collapsed from the transverse to the descending colon. No obstructing lesion identified. Diverticula of sigmoid colon without acute inflammation.  Vascular/Lymphatic: Motion degradation of the upper abdomen. CTA examination aorta demonstrates minimal atherosclerotic calcification. The SMA and celiac trunk are widely patent. There is narrowing of the proximal celiac trunk with post stenotic dilatation seen best on sagittal image 86, series 11. The branches of study trunk are patent. The branches of the celiac trunk are patent. SMA is patent distally. The IMA is patent.  There bilateral single renal arteries. There is calcification the ostia of the renal arteries. Iliac  arteries are normal. No aneurysm. Abdominal aorta is normal caliber. There is no retroperitoneal or periportal lymphadenopathy. No pelvic lymphadenopathy.  Reproductive: Post hysterectomy  Musculoskeletal: No aggressive osseous lesion. Severe degenerate change in the lumbar spine with endplate and facet hypertrophy.  Other: There is haziness to the upper abdominal mesentery beneath the pancreas. There is motion degradation through this region (image 66, series 4.  Review of the MIP images confirms the above findings.  IMPRESSION: 1. Motion degradation through the upper abdomen. The branches of the SMA and celiac trunk are widely patent. There is poststenotic dilatation of the celiac trunk. 2. Haziness of the mesentery inferior to the pancreas. This is not specific and has more of a pattern of pancreatitis than mesenteric ischemia. 3. Diverticulosis of the sigmoid colon without evidence of acute diverticulitis.   Electronically Signed   By: Suzy Bouchard M.D.   On: 10/25/2014 09:42    Scheduled Meds: . acetaminophen  650 mg Oral TID  . amLODipine  5 mg Oral Q breakfast  . ciprofloxacin  400 mg Intravenous Q12H  . losartan  100 mg Oral Q breakfast  . metoprolol tartrate  25 mg Oral BID  . mirabegron ER  25 mg Oral Daily  . pantoprazole  40 mg Oral Daily  . pyridOXINE  100 mg Oral Q breakfast  . sodium chloride  3 mL Intravenous Q12H  . traMADol  50 mg Oral q morning - 10a  . vancomycin  500 mg Oral 4 times per day  . vitamin C  500 mg Oral Q breakfast  . vitamin E  400 Units Oral Q breakfast   Continuous Infusions: . sodium chloride 75 mL/hr at 10/25/14 2352      Time spent: 25 minutes    Leomar Westberg, [place]  [facility] Pager [phone]. If 7PM-7AM, please contact night-coverage at [email or web address], password [facility] 10/26/2014, 11:50 AM  LOS: 1 day

## 2014-10-26 NOTE — Consult Note (Addendum)
Reason for Consult: GI bleeding Referring Physician: Hospital team  Brandy Crawford is an 81 y.o. female.  HPI: Patient denies any previous GI problems but had noted some black stools for about a week and is on an aspirin a day and has been using some Aleve as well and is not on a pump inhibitor and does not believe she's had a colonoscopy in the past but describes either a flexible sigmoidoscopy or proctoscopy over 20 years ago and her family history is negative and she's also had some vague lower abdominal cramping but no upper tract symptoms and she did not see any bright red blood at home but she did have a little bright red blood this morning and no other specific complaints although she did fall at home workup in the emergency room was okay and her CT was okay  Past Medical History  Diagnosis Date  . Asthma   . GERD (gastroesophageal reflux disease)   . Hypertension   . Osteoporosis   . TIA (transient ischemic attack) 09/20/2006  . Hyperlipemia   . Cervical disc disease   . Anemia   . Depression   . Lumbosacral spondylosis 10/28/2009  . Scoliosis     Past Surgical History  Procedure Laterality Date  . Appendectomy    . Abdominal hysterectomy    . Tonsillectomy    . Cholecystectomy      Family History  Problem Relation Age of Onset  . Pancreatic cancer Father 19    deceased  . Heart failure Mother 104    deceased  . Leukemia Brother 3    deceased  . Diabetes type II    . Alcohol abuse      Social History:  reports that she has never smoked. She has never used smokeless tobacco. She reports that she does not drink alcohol or use illicit drugs.  Allergies:  Allergies  Allergen Reactions  . Amoxicillin     Per MAR  . Oxycodone Other (See Comments)    Per MAR   . Sulfamethoxazole     Per MAR   . Penicillins Rash    Medications: I have reviewed the patient's current medications.  Results for orders placed or performed during the hospital encounter of 10/25/14  (from the past 48 hour(s))  Comprehensive metabolic panel     Status: Abnormal   Collection Time: 10/25/14  7:14 AM  Result Value Ref Range   Sodium 134 (L) 135 - 145 mmol/L   Potassium 4.6 3.5 - 5.1 mmol/L   Chloride 105 101 - 111 mmol/L   CO2 23 22 - 32 mmol/L   Glucose, Bld 164 (H) 65 - 99 mg/dL   BUN 28 (H) 6 - 20 mg/dL   Creatinine, Ser 0.67 0.44 - 1.00 mg/dL   Calcium 9.5 8.9 - 10.3 mg/dL   Total Protein 6.4 (L) 6.5 - 8.1 g/dL   Albumin 3.7 3.5 - 5.0 g/dL   AST 23 15 - 41 U/L   ALT 22 14 - 54 U/L   Alkaline Phosphatase 55 38 - 126 U/L   Total Bilirubin 0.9 0.3 - 1.2 mg/dL   GFR calc non Af Amer >60 >60 mL/min   GFR calc Af Amer >60 >60 mL/min    Comment: (NOTE) The eGFR has been calculated using the CKD EPI equation. This calculation has not been validated in all clinical situations. eGFR's persistently <60 mL/min signify possible Chronic Kidney Disease.    Anion gap 6 5 - 15  CBC  Status: Abnormal   Collection Time: 10/25/14  7:14 AM  Result Value Ref Range   WBC 20.2 (H) 4.0 - 10.5 K/uL   RBC 3.55 (L) 3.87 - 5.11 MIL/uL   Hemoglobin 11.0 (L) 12.0 - 15.0 g/dL   HCT 32.7 (L) 36.0 - 46.0 %   MCV 92.1 78.0 - 100.0 fL   MCH 31.0 26.0 - 34.0 pg   MCHC 33.6 30.0 - 36.0 g/dL   RDW 12.8 11.5 - 15.5 %   Platelets 333 150 - 400 K/uL  Type and screen     Status: None   Collection Time: 10/25/14  7:14 AM  Result Value Ref Range   ABO/RH(D) O POS    Antibody Screen NEG    Sample Expiration 10/28/2014   Differential     Status: Abnormal   Collection Time: 10/25/14  7:14 AM  Result Value Ref Range   Neutrophils Relative % 93 (H) 43 - 77 %   Neutro Abs 19.1 (H) 1.7 - 7.7 K/uL   Lymphocytes Relative 2 (L) 12 - 46 %   Lymphs Abs 0.5 (L) 0.7 - 4.0 K/uL   Monocytes Relative 5 3 - 12 %   Monocytes Absolute 1.0 0.1 - 1.0 K/uL   Eosinophils Relative 0 0 - 5 %   Eosinophils Absolute 0.0 0.0 - 0.7 K/uL   Basophils Relative 0 0 - 1 %   Basophils Absolute 0.0 0.0 - 0.1 K/uL   ABO/Rh     Status: None   Collection Time: 10/25/14  7:17 AM  Result Value Ref Range   ABO/RH(D) O POS   POC occult blood, ED     Status: Abnormal   Collection Time: 10/25/14  7:26 AM  Result Value Ref Range   Fecal Occult Bld POSITIVE (A) NEGATIVE  Protime-INR     Status: None   Collection Time: 10/25/14  7:31 AM  Result Value Ref Range   Prothrombin Time 14.2 11.6 - 15.2 seconds   INR 1.08 0.00 - 1.49  Lipase, blood     Status: Abnormal   Collection Time: 10/25/14  7:31 AM  Result Value Ref Range   Lipase 10 (L) 22 - 51 U/L  I-Stat CG4 Lactic Acid, ED     Status: Abnormal   Collection Time: 10/25/14  7:39 AM  Result Value Ref Range   Lactic Acid, Venous 2.63 (HH) 0.5 - 2.0 mmol/L   Comment NOTIFIED PHYSICIAN   Urinalysis, Routine w reflex microscopic (not at [facility])     Status: Abnormal   Collection Time: 10/25/14  9:02 AM  Result Value Ref Range   Color, Urine YELLOW YELLOW   APPearance CLOUDY (A) CLEAR   Specific Gravity, Urine 1.033 (H) 1.005 - 1.030   pH 5.5 5.0 - 8.0   Glucose, UA NEGATIVE NEGATIVE mg/dL   Hgb urine dipstick NEGATIVE NEGATIVE   Bilirubin Urine NEGATIVE NEGATIVE   Ketones, ur NEGATIVE NEGATIVE mg/dL   Protein, ur NEGATIVE NEGATIVE mg/dL   Urobilinogen, UA 0.2 0.0 - 1.0 mg/dL   Nitrite POSITIVE (A) NEGATIVE   Leukocytes, UA SMALL (A) NEGATIVE  Urine microscopic-add on     Status: Abnormal   Collection Time: 10/25/14  9:02 AM  Result Value Ref Range   WBC, UA 11-20 <3 WBC/hpf   Bacteria, UA MANY (A) RARE   Casts HYALINE CASTS (A) NEGATIVE   Urine-Other MUCOUS PRESENT   Lactic acid, plasma     Status: Abnormal   Collection Time: 10/25/14 11:35 AM  Result  Value Ref Range   Lactic Acid, Venous 2.1 (HH) 0.5 - 2.0 mmol/L    Comment: REPEATED TO VERIFY CRITICAL RESULT CALLED TO, READ BACK BY AND VERIFIED WITH: DUNK,K. RN AT 1322 10/25/14 MULLINS,T   Procalcitonin     Status: None   Collection Time: 10/25/14 11:35 AM  Result Value Ref Range    Procalcitonin <0.10 ng/mL    Comment:        Interpretation: PCT (Procalcitonin) <= 0.5 ng/mL: Systemic infection (sepsis) is not likely. Local bacterial infection is possible. (NOTE)         ICU PCT Algorithm               Non ICU PCT Algorithm    ----------------------------     ------------------------------         PCT < 0.25 ng/mL                 PCT < 0.1 ng/mL     Stopping of antibiotics            Stopping of antibiotics       strongly encouraged.               strongly encouraged.    ----------------------------     ------------------------------       PCT level decrease by               PCT < 0.25 ng/mL       >= 80% from peak PCT       OR PCT 0.25 - 0.5 ng/mL          Stopping of antibiotics                                             encouraged.     Stopping of antibiotics           encouraged.    ----------------------------     ------------------------------       PCT level decrease by              PCT >= 0.25 ng/mL       < 80% from peak PCT        AND PCT >= 0.5 ng/mL            Continuin g antibiotics                                              encouraged.       Continuing antibiotics            encouraged.    ----------------------------     ------------------------------     PCT level increase compared          PCT > 0.5 ng/mL         with peak PCT AND          PCT >= 0.5 ng/mL             Escalation of antibiotics                                          strongly encouraged.  Escalation of antibiotics        strongly encouraged.   Hemoglobin and hematocrit, blood     Status: Abnormal   Collection Time: 10/25/14 11:35 AM  Result Value Ref Range   Hemoglobin 10.0 (L) 12.0 - 15.0 g/dL   HCT 29.1 (L) 36.0 - 46.0 %  MRSA PCR Screening     Status: None   Collection Time: 10/25/14 12:31 PM  Result Value Ref Range   MRSA by PCR NEGATIVE NEGATIVE    Comment:        The GeneXpert MRSA Assay (FDA approved for NASAL specimens only), is one component of  a comprehensive MRSA colonization surveillance program. It is not intended to diagnose MRSA infection nor to guide or monitor treatment for MRSA infections.   CBC     Status: Abnormal   Collection Time: 10/26/14  5:22 AM  Result Value Ref Range   WBC 9.2 4.0 - 10.5 K/uL   RBC 2.74 (L) 3.87 - 5.11 MIL/uL   Hemoglobin 8.8 (L) 12.0 - 15.0 g/dL   HCT 26.1 (L) 36.0 - 46.0 %   MCV 95.3 78.0 - 100.0 fL   MCH 32.1 26.0 - 34.0 pg   MCHC 33.7 30.0 - 36.0 g/dL   RDW 13.6 11.5 - 15.5 %   Platelets 285 150 - 400 K/uL  Basic metabolic panel     Status: Abnormal   Collection Time: 10/26/14  5:22 AM  Result Value Ref Range   Sodium 140 135 - 145 mmol/L   Potassium 3.7 3.5 - 5.1 mmol/L    Comment: DELTA CHECK NOTED REPEATED TO VERIFY    Chloride 110 101 - 111 mmol/L   CO2 24 22 - 32 mmol/L   Glucose, Bld 100 (H) 65 - 99 mg/dL   BUN 13 6 - 20 mg/dL   Creatinine, Ser 0.66 0.44 - 1.00 mg/dL   Calcium 9.0 8.9 - 10.3 mg/dL   GFR calc non Af Amer >60 >60 mL/min   GFR calc Af Amer >60 >60 mL/min    Comment: (NOTE) The eGFR has been calculated using the CKD EPI equation. This calculation has not been validated in all clinical situations. eGFR's persistently <60 mL/min signify possible Chronic Kidney Disease.    Anion gap 6 5 - 15    Ct Head Wo Contrast  10/25/2014   CLINICAL DATA:  Head trauma with scalp laceration secondary to a fall today in her room.  EXAM: CT HEAD WITHOUT CONTRAST  TECHNIQUE: Contiguous axial images were obtained from the base of the skull through the vertex without intravenous contrast.  COMPARISON:  CT scan dated 04/28/2005 and brain MR dated 07/01/2005  FINDINGS: No mass lesion. No midline shift. No acute hemorrhage or hematoma. No extra-axial fluid collections. No evidence of acute infarction. There are scattered areas of periventricular white matter lucency consistent with chronic small vessel ischemic disease. There is diffuse cerebral cortical atrophy with secondary  ventricular dilatation, slightly progressed since 2007.  No acute osseous abnormality. Marked degenerative changes at the articulation of the odontoid with the anterior arch of C1.  IMPRESSION: No acute intracranial abnormality. Atrophy with chronic small vessel ischemic disease, slightly progressed since 2007.   Electronically Signed   By: Lorriane Shire M.D.   On: 10/25/2014 08:42   Dg Chest Port 1 View  10/25/2014   CLINICAL DATA:  Recent fall from bed with chest pain and weakness  EXAM: PORTABLE CHEST - 1 VIEW  COMPARISON:  01/13/2014  FINDINGS: Cardiac shadow  is mildly enlarged in size. A small hiatal hernia is noted. Aortic calcifications are again seen. The lungs are well-aerated without focal infiltrate or sizable effusion.  IMPRESSION: Aortic atherosclerotic change.  No acute abnormality is seen.  Small hiatal hernia.   Electronically Signed   By: Inez Catalina M.D.   On: 10/25/2014 11:57   Ct Angio Abd/pel W/ And/or W/o  10/25/2014   CLINICAL DATA:  Right lower quadrant pain and dark stools.  EXAM: CTA ABDOMEN AND PELVIS WITHOUT AND WITH CONTRAST  TECHNIQUE: Multidetector CT imaging of the abdomen and pelvis was performed using the standard protocol during bolus administration of intravenous contrast. Multiplanar reconstructed images and MIPs were obtained and reviewed to evaluate the vascular anatomy.  CONTRAST:  19mL OMNIPAQUE IOHEXOL 350 MG/ML SOLN  COMPARISON:  CT thorax 01/13/2014  FINDINGS: Lower chest: Linear bandlike Atelectasis in the inferior lingula and left lower lobe is similar prior.  Hepatobiliary: No focal hepatic lesion. Postcholecystectomy. No biliary dilatation. Common bile duct is normal caliber through the pancreatic head.  Pancreas: Is atrophy of the pancreas.  Clear ductal dilatation.  Spleen: Normal spleen  Adrenals/urinary tract: Adrenal glands and kidneys are normal. The ureters and bladder normal.  Stomach/Bowel: Stomach, small-bowel, and cecum are normal. The colon is  collapsed from the transverse to the descending colon. No obstructing lesion identified. Diverticula of sigmoid colon without acute inflammation.  Vascular/Lymphatic: Motion degradation of the upper abdomen. CTA examination aorta demonstrates minimal atherosclerotic calcification. The SMA and celiac trunk are widely patent. There is narrowing of the proximal celiac trunk with post stenotic dilatation seen best on sagittal image 86, series 11. The branches of study trunk are patent. The branches of the celiac trunk are patent. SMA is patent distally. The IMA is patent.  There bilateral single renal arteries. There is calcification the ostia of the renal arteries. Iliac arteries are normal. No aneurysm. Abdominal aorta is normal caliber. There is no retroperitoneal or periportal lymphadenopathy. No pelvic lymphadenopathy.  Reproductive: Post hysterectomy  Musculoskeletal: No aggressive osseous lesion. Severe degenerate change in the lumbar spine with endplate and facet hypertrophy.  Other: There is haziness to the upper abdominal mesentery beneath the pancreas. There is motion degradation through this region (image 66, series 4.  Review of the MIP images confirms the above findings.  IMPRESSION: 1. Motion degradation through the upper abdomen. The branches of the SMA and celiac trunk are widely patent. There is poststenotic dilatation of the celiac trunk. 2. Haziness of the mesentery inferior to the pancreas. This is not specific and has more of a pattern of pancreatitis than mesenteric ischemia. 3. Diverticulosis of the sigmoid colon without evidence of acute diverticulitis.   Electronically Signed   By: Suzy Bouchard M.D.   On: 10/25/2014 09:42    ROS negative except above Blood pressure 142/62, pulse 84, temperature 97.4 F (36.3 C), temperature source Oral, resp. rate 18, height $RemoveBe'5\' 6"'reafycGGP$  (1.676 m), weight 67.4 kg (148 lb 9.4 oz), SpO2 97 %. Physical Exam vital signs stable afebrile no acute distress elderly  answers all questions appropriately lungs are clear regular rate and rhythm abdomen is soft very minimal lower discomfort good bowel sounds no guarding or rebound labs and CT reviewed hemoglobin decreased but so did BUN as did white count  Assessment/Plan: GI bleeding in a patient with multiple medical problems Plan: We'll continue clear liquids and observation consider either endoscopy or colonoscopy depending on if bleeding continues or not and please call me if signs of  acute bleeding otherwise we'll continue to follow and think about workup which was discussed with the patient and will add pump inhibitor and hold off on Questran because if a colonoscopy is needed and that will make the prep much harder  Kiyanna Biegler E 10/26/2014, 10:41 AM

## 2014-10-26 NOTE — Clinical Social Work Placement (Signed)
   CLINICAL SOCIAL WORK PLACEMENT  NOTE  Date:  10/26/2014  Patient Details  Name: Brandy Crawford MRN: [id number] Date of Birth: 03-17-1924  Clinical Social Work is seeking post-discharge placement for this patient at the   level of care (*CSW will initial, date and re-position this form in  chart as items are completed):      Patient/family provided with [facility] Work Department's list of facilities offering this level of care within the geographic area requested by the patient (or if unable, by the patient's family).      Patient/family informed of their freedom to choose among providers that offer the needed level of care, that participate in Medicare, Medicaid or managed care program needed by the patient, have an available bed and are willing to accept the patient.      Patient/family informed of [facility]'s ownership interest in [facility] and [facility], as well as of the fact that they are under no obligation to receive care at these facilities.  PASRR submitted to EDS on       PASRR number received on       Existing PASRR number confirmed on       FL2 transmitted to all facilities in geographic area requested by pt/family on       FL2 transmitted to all facilities within larger geographic area on       Patient informed that his/her managed care company has contracts with or will negotiate with certain facilities, including the following:            Patient/family informed of bed offers received.  Patient chooses bed at       Physician recommends and patient chooses bed at      Patient to be transferred to   on  .  Patient to be transferred to facility by       Patient family notified on   of transfer.  Name of family member notified:        PHYSICIAN       Additional Comment:    _______________________________________________ Edson Snowball, LCSW 10/26/2014, 3:39 PM

## 2014-10-26 NOTE — Evaluation (Signed)
Physical Therapy Evaluation Patient Details Name: Brandy Crawford MRN: [id number] DOB: 06-Jul-1923 Today's Date: 10/26/2014   History of Present Illness  81 year old female with history of GERD, TIA, hypertension, hyperlipidemia from ALF and admitted for GI bleed and fall (likely mechanical - reaching for object while sitting on bed)  Clinical Impression  Pt admitted with above diagnosis. Pt currently with functional limitations due to the deficits listed below (see PT Problem List).  Pt will benefit from skilled PT to increase their independence and safety with mobility to allow discharge to the venue listed below.   Pt from ALF and requiring min assist for mobility at this time.  Pt agreeable to return to skilled section of facility for more assist upon d/c.     Follow Up Recommendations SNF    Equipment Recommendations  None recommended by PT    Recommendations for Other Services       Precautions / Restrictions Precautions Precautions: Fall Precaution Comments: incontinent      Mobility  Bed Mobility Overal bed mobility: Needs Assistance Bed Mobility: Supine to Sit     Supine to sit: Min assist        Transfers Overall transfer level: Needs assistance Equipment used: Rolling walker (2 wheeled) Transfers: Sit to/from [facility] Sit to Stand: Min assist Stand pivot transfers: Min assist       General transfer comment: verbal cues for safety, assist to rise and steady  Ambulation/Gait Ambulation/Gait assistance: Min assist Ambulation Distance (Feet): 16 Feet Assistive device: Rolling walker (2 wheeled) Gait Pattern/deviations: Step-to pattern;Step-through pattern;Antalgic     General Gait Details: initially step to gait leading with R LE, unsteady requiring assist, distance limited by pt  Stairs            Wheelchair Mobility    Modified Rankin (Stroke Patients Only)       Balance Overall balance assessment: History of  Falls                                           Pertinent Vitals/Pain Pain Assessment: No/denies pain    Home Living Family/patient expects to be discharged to:: Assisted living               Home Equipment: Walker - 2 wheels      Prior Function Level of Independence: Needs assistance   Gait / Transfers Assistance Needed: ambulates over 100 feet to dining hall 3x/day for meals  ADL's / Homemaking Assistance Needed: states she gets assist with 2 baths a week        Hand Dominance        Extremity/Trunk Assessment               Lower Extremity Assessment: Generalized weakness         Communication   Communication: No difficulties  Cognition Arousal/Alertness: Awake/alert Behavior During Therapy: WFL for tasks assessed/performed Overall Cognitive Status: Within Functional Limits for tasks assessed                      General Comments      Exercises        Assessment/Plan    PT Assessment Patient needs continued PT services  PT Diagnosis Difficulty walking   PT Problem List Decreased strength;Decreased activity tolerance;Decreased balance;Decreased mobility  PT Treatment Interventions DME instruction;Gait training;Functional mobility training;Patient/family education;Therapeutic exercise;Therapeutic activities  PT Goals (Current goals can be found in the Care Plan section) Acute Rehab PT Goals PT Goal Formulation: With patient Time For Goal Achievement: 11/02/14 Potential to Achieve Goals: Good    Frequency Min 3X/week   Barriers to discharge        Co-evaluation               End of Session Equipment Utilized During Treatment: Gait belt Activity Tolerance: Patient limited by fatigue Patient left: in bed;with call bell/phone within reach;with bed alarm set           Time: 8032-1224 PT Time Calculation (min) (ACUTE ONLY): 20 min   Charges:   PT Evaluation $Initial PT Evaluation Tier I: 1  Procedure     PT G Codes:        Mad[place]x First,KATHrine E 10/26/2014, 12:30 PM Carmelia Bake, PT, DPT 10/26/2014 Pager: [phone]

## 2014-10-27 DIAGNOSIS — G319 Degenerative disease of nervous system, unspecified: Secondary | ICD-10-CM

## 2014-10-27 DIAGNOSIS — I700 Atherosclerosis of aorta: Secondary | ICD-10-CM

## 2014-10-27 DIAGNOSIS — B9620 Unspecified Escherichia coli [E. coli] as the cause of diseases classified elsewhere: Secondary | ICD-10-CM

## 2014-10-27 DIAGNOSIS — N390 Urinary tract infection, site not specified: Secondary | ICD-10-CM

## 2014-10-27 DIAGNOSIS — M51369 Other intervertebral disc degeneration, lumbar region without mention of lumbar back pain or lower extremity pain: Secondary | ICD-10-CM

## 2014-10-27 DIAGNOSIS — K5790 Diverticulosis of intestine, part unspecified, without perforation or abscess without bleeding: Secondary | ICD-10-CM

## 2014-10-27 DIAGNOSIS — I679 Cerebrovascular disease, unspecified: Secondary | ICD-10-CM

## 2014-10-27 DIAGNOSIS — M419 Scoliosis, unspecified: Secondary | ICD-10-CM

## 2014-10-27 DIAGNOSIS — M5136 Other intervertebral disc degeneration, lumbar region: Secondary | ICD-10-CM

## 2014-10-27 HISTORY — DX: Scoliosis, unspecified: M41.9

## 2014-10-27 HISTORY — DX: Other intervertebral disc degeneration, lumbar region: M51.36

## 2014-10-27 HISTORY — DX: Cerebrovascular disease, unspecified: I67.9

## 2014-10-27 HISTORY — DX: Atherosclerosis of aorta: I70.0

## 2014-10-27 HISTORY — DX: Degenerative disease of nervous system, unspecified: G31.9

## 2014-10-27 HISTORY — DX: Diverticulosis of intestine, part unspecified, without perforation or abscess without bleeding: K57.90

## 2014-10-27 HISTORY — DX: Other intervertebral disc degeneration, lumbar region without mention of lumbar back pain or lower extremity pain: M51.369

## 2014-10-27 LAB — HEMOGLOBIN AND HEMATOCRIT, BLOOD
HCT: 26.4 % — ABNORMAL LOW (ref 36.0–46.0)
HCT: 27.4 % — ABNORMAL LOW (ref 36.0–46.0)
HEMOGLOBIN: 8.8 g/dL — AB (ref 12.0–15.0)
Hemoglobin: 9.2 g/dL — ABNORMAL LOW (ref 12.0–15.0)

## 2014-10-27 IMAGING — CT CT ANGIO CHEST
1 of 2 series · 19 of 32 positions shown · IV contrast (OMNIPAQUE 350)
Comparison: Prior radiograph performed earlier the same day.

CLINICAL DATA: 89-year-old female with upper back pain radiating
down left shoulder. Pain began few days ago, initially better, but
worsened today. Evaluate for aortic dissection.

EXAM:
CT ANGIOGRAPHY CHEST WITH CONTRAST
TECHNIQUE: Multidetector CT imaging of the chest was performed using the
standard protocol during bolus administration of intravenous
contrast. Multiplanar CT image reconstructions and MIPs were
obtained to evaluate the vascular anatomy.
CONTRAST:  100mL OMNIPAQUE IOHEXOL 350 MG/ML SOLN

[Series 14: thins for pacs · axial · 0.74mm/px · z∈[-136,+113]mm · 19 of 277 slices shown]
[im 14/277  lung]
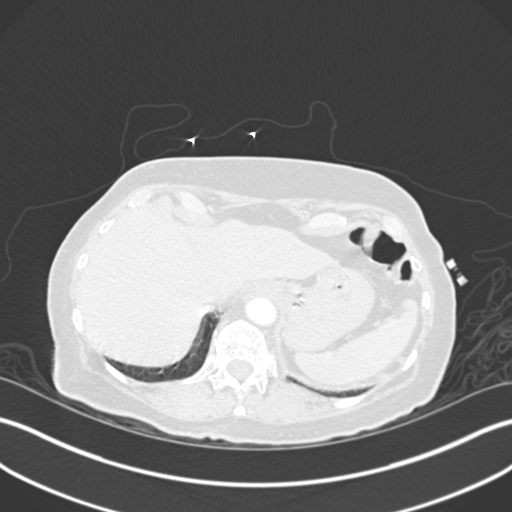
[im 28/277  mediastinal]
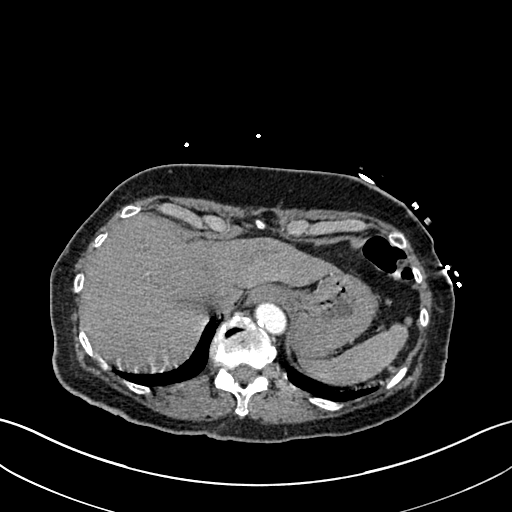
[im 42/277  lung]
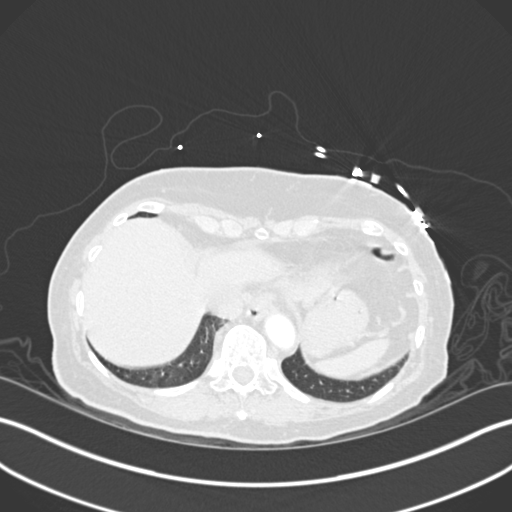
[im 70/277  mediastinal]
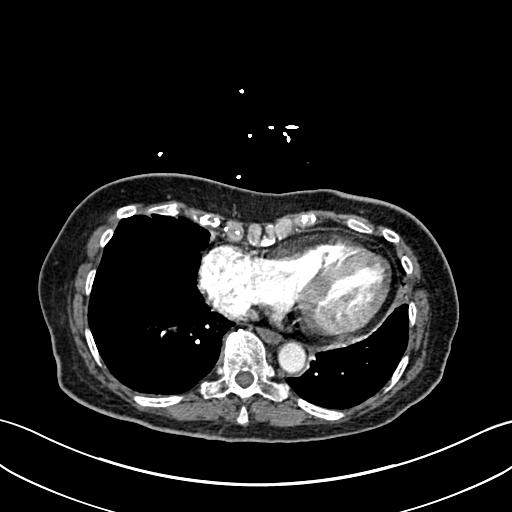
[im 83/277  lung]
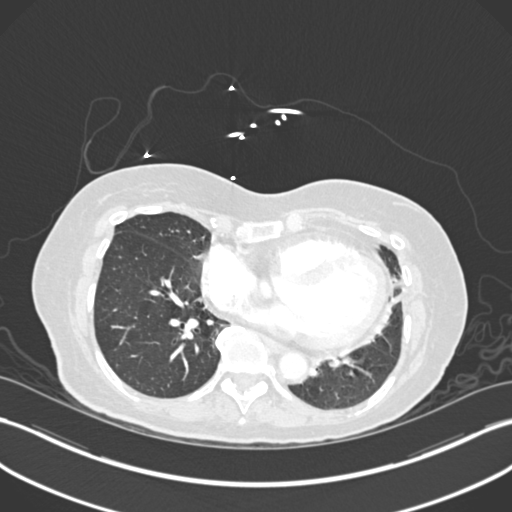
[im 93/277  mediastinal]
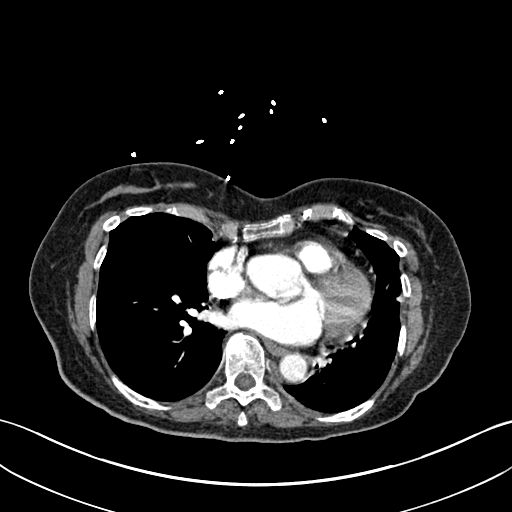
[im 97/277  lung]
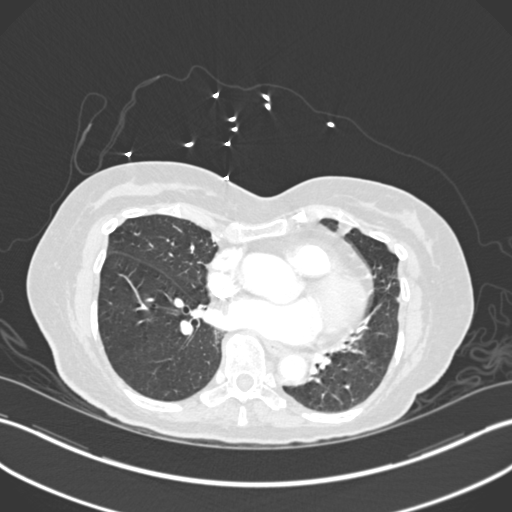
[im 111/277  mediastinal]
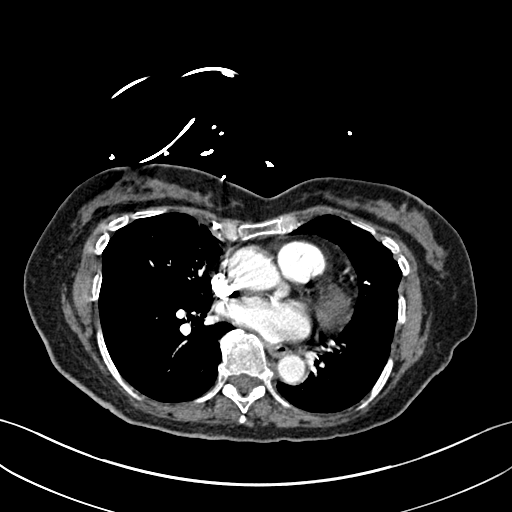
[im 125/277  lung]
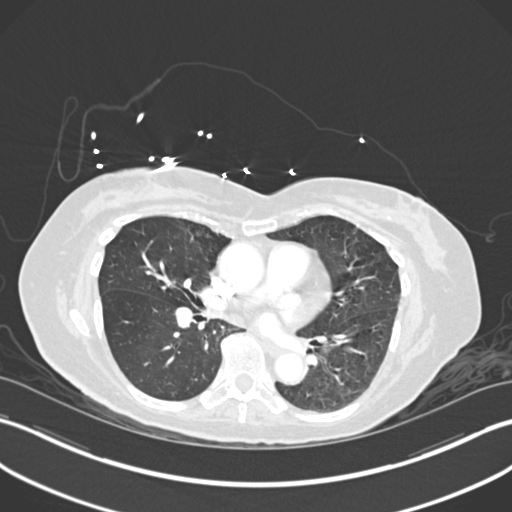
[im 139/277  mediastinal]
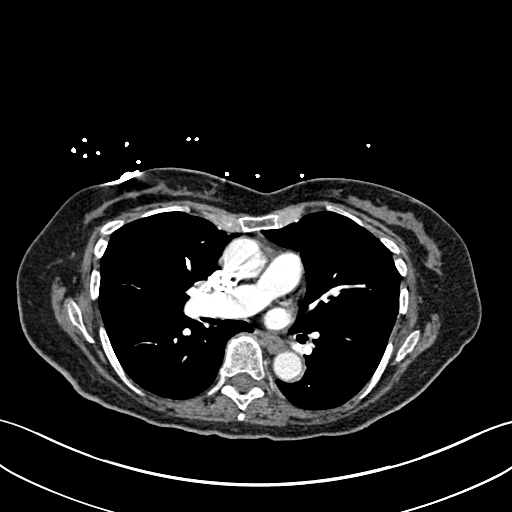
[im 152/277  lung]
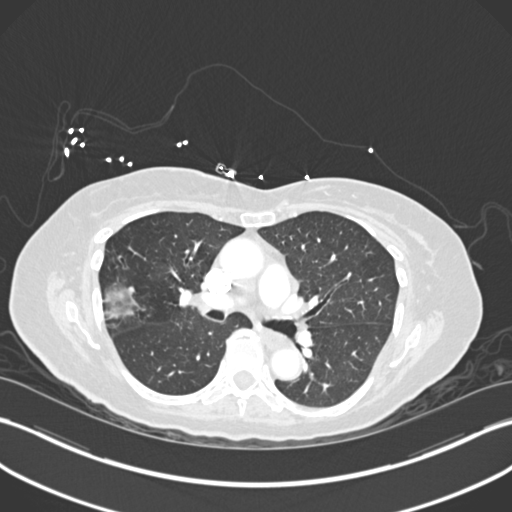
[im 166/277  mediastinal]
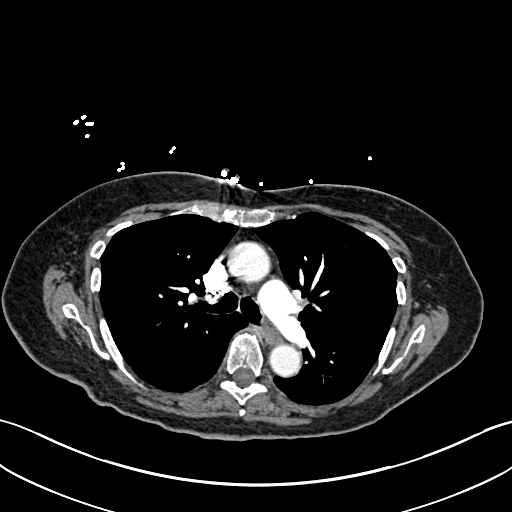
[im 180/277  lung]
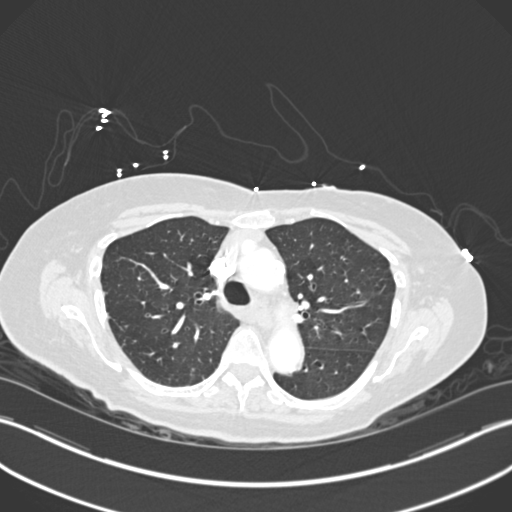
[im 185/277  mediastinal]
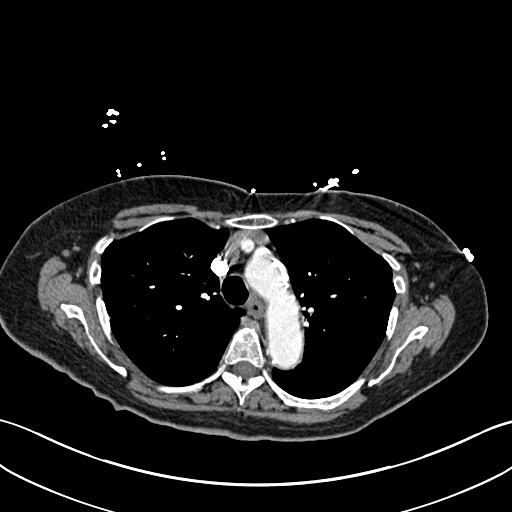
[im 194/277  lung]
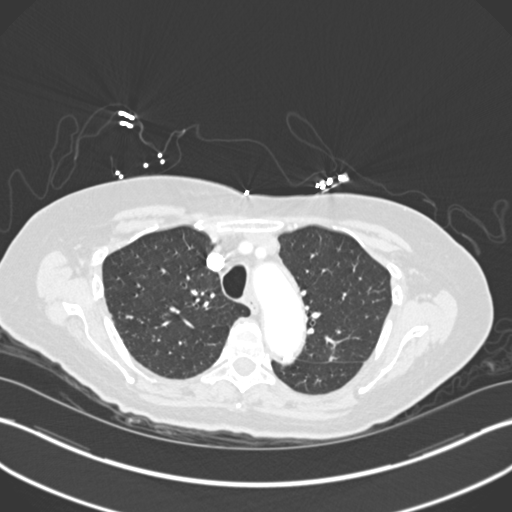
[im 208/277  mediastinal]
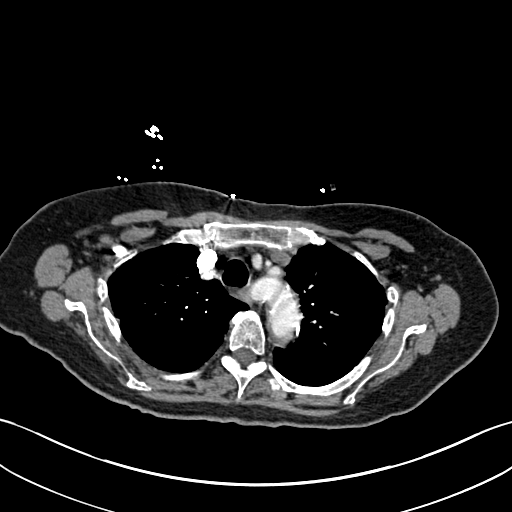
[im 235/277  lung]
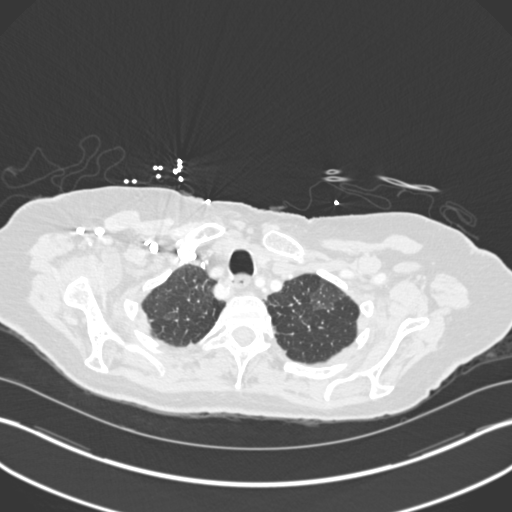
[im 249/277  mediastinal]
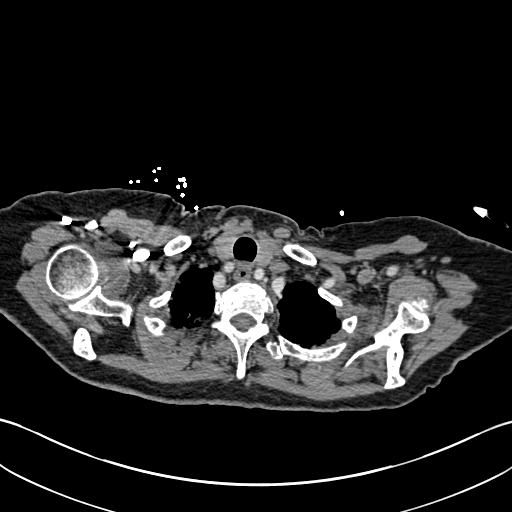
[im 263/277  lung]
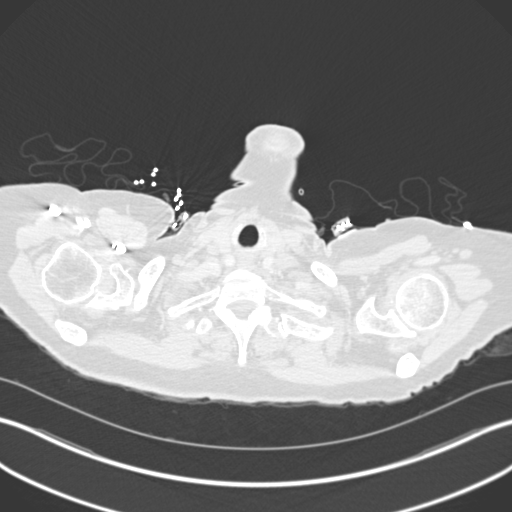

[19 of 32 positions shown; findings below may reference images not displayed]

FINDINGS: Thyroid gland within normal limits. No pathologically enlarged
mediastinal, hilar, or axillary lymph nodes are identified.

Intrathoracic aorta is of normal caliber and appearance. No evidence
of aortic dissection. Aberrant right subclavian artery noted.
Moderate atheromatous disease present within the aortic arch and
origin of the great vessels.

Moderate cardiomegaly present with scattered calcified atheromatous
disease within the left anterior descending coronary artery. No
pericardial effusion.

Pulmonary arteries grossly within normal limits, although this study
is not optimized further evaluation. No central pulmonary embolus.

Upper lobe predominant emphysematous changes present. There are
scattered tree-in-bud nodular opacities within the peripheral right
upper lobe (series 15, image 40), suggestive of possible
endobronchial/atypical infection. No other focal infiltrates
identified. No pulmonary edema or pleural effusion. No pneumothorax.

Visualized portions of the upper abdomen are within normal limits.

No acute osseous abnormality. No worrisome lytic or blastic osseous
lesions.
IMPRESSION: 1. No evidence for aortic dissection identified.
2. Nodular tree-in-bud opacities within the peripheral right upper
lobe, which may reflect atypical/endobronchial infection.
3. Emphysema.

## 2014-10-27 NOTE — Progress Notes (Signed)
Patient with 2 dark bloody stools overnight. No complaints of pain or nausea. Patient was concerned with being tired.

## 2014-10-27 NOTE — Progress Notes (Signed)
[facility] PROGRESS NOTE  Brandy Crawford QIH:[id number] DOB: 05/01/1923 DOA: 10/25/2014 PCP: Estill Dooms, MD Brief narrative 81 year old female with history of GERD, TIA, hypertension, hyperlipidemia sent from skilled nursing facility after sustaining a fall out of bed which appears to be mechanical. She was also having some dark stools at the facility. Patient was septic on presentation with significant leukocytosis and tachycardic with elevated lactate. UA was positive for UTI and patient had abdominal discomfort. CT of the abdomen and pelvis was unremarkable except for diverticulosis. Patient having several episodes of rectal bleed mixed with stool after being admitted to telemetry. [facility] following in consultation.  Assessment/Plan: Sepsis Resolved. Possibly related to Escherichia coli UTI versus lower GI bleed. Doubt C. difficile enteritis. GI pathogen panel pending. Patient having formed stools mixed with blood. Will discontinue oral vancomycin. Continue on IV ciprofloxacin only. Urine culture growing Escherichia coli which is sensitive to Rocephin.   GI bleed Possibly diverticular. Still having dark stools but H&H stable over the past 24 hours. Seen by [facility] and recommend monitor on clear liquid. Will plan on EGD versus colonoscopy if bleeding persist or has further drop in H&H. Added PPI. Discontinued aspirin.. Avoid NSAIDs.  Fall at nursing home Appears to be mechanical. Stable on telemetry.  History of TIA Holding aspirin  DVT prophylaxis: SCDs  Diet: Clears  CODE STATUS: DO NOT RESUSCITATE  Family communication: None at bedside  Disposition: Currently inpatient. Return to SNF once weekly stable and no intervention required (anticipate discharge on 7/18)  HPI/Subjective: Patient seen and examined. 2 episodes of dark bloody stools overnight. Reports some abdominal fullness but no nausea or vomiting.  Objective: Filed Vitals:   10/27/14 0922  BP: 150/51   Pulse: 79  Temp:   Resp:     Intake/Output Summary (Last 24 hours) at 10/27/14 0929 Last data filed at 10/27/14 0600  Gross per 24 hour  Intake   2485 ml  Output      0 ml  Net   2485 ml   Filed Weights   10/25/14 0634 10/25/14 1119  Weight: 68.04 kg (150 lb) 67.4 kg (148 lb 9.4 oz)    Exam:   General:  Elderly female in no acute distress  HEENT: Pallor present, moist oral mucosa,   Chest: Clear to auscultation bilaterally, no added sounds  CVS: Normal S1 and S2, no murmurs rub or gallop  GI: Soft, mild abdominal distention, nontender, bowel sounds present  Musculoskeletal musculoskeletal: Warm, no edema  CNS: Alert and oriented    Data Reviewed: Basic Metabolic Panel:  Recent Labs Lab 10/23/14 10/25/14 0714 10/26/14 0522  NA 141 134* 140  K 4.3 4.6 3.7  CL  --  105 110  CO2  --  23 24  GLUCOSE  --  164* 100*  BUN 15 28* 13  CREATININE 0.6 0.67 0.66  CALCIUM  --  9.5 9.0   Liver Function Tests:  Recent Labs Lab 10/23/14 10/25/14 0714  AST 15 23  ALT 16 22  ALKPHOS 58 55  BILITOT  --  0.9  PROT  --  6.4*  ALBUMIN  --  3.7    Recent Labs Lab 10/25/14 0731  LIPASE 10*   No results for input(s): AMMONIA in the last 168 hours. CBC:  Recent Labs Lab 10/23/14 10/25/14 0714 10/25/14 1135 10/26/14 0522 10/26/14 1650 10/27/14 0537  WBC 7.8 20.2*  --  9.2  --   --   NEUTROABS  --  19.1*  --   --   --   --  HGB 12.5 11.0* 10.0* 8.8* 8.9* 8.8*  HCT 37 32.7* 29.1* 26.1* 26.2* 26.4*  MCV  --  92.1  --  95.3  --   --   PLT 323 333  --  285  --   --    Cardiac Enzymes: No results for input(s): CKTOTAL, CKMB, CKMBINDEX, TROPONINI in the last 168 hours. BNP (last 3 results) No results for input(s): BNP in the last 8760 hours.  ProBNP (last 3 results) No results for input(s): PROBNP in the last 8760 hours.  CBG: No results for input(s): GLUCAP in the last 168 hours.  Recent Results (from the past 240 hour(s))  Urine culture     Status:  None   Collection Time: 10/25/14  9:40 AM  Result Value Ref Range Status   Specimen Description URINE, CATHETERIZED  Final   Special Requests NONE  Final   Culture   Final    >=100,000 COLONIES/mL ESCHERICHIA COLI Performed at [facility]    Report Status 10/27/2014 FINAL  Final   Organism ID, Bacteria ESCHERICHIA COLI  Final      Susceptibility   Escherichia coli - MIC*    AMPICILLIN <=2 SENSITIVE Sensitive     CEFAZOLIN <=4 SENSITIVE Sensitive     CEFTRIAXONE <=1 SENSITIVE Sensitive     CIPROFLOXACIN <=0.25 SENSITIVE Sensitive     GENTAMICIN <=1 SENSITIVE Sensitive     IMIPENEM <=0.25 SENSITIVE Sensitive     NITROFURANTOIN <=16 SENSITIVE Sensitive     TRIMETH/SULFA <=20 SENSITIVE Sensitive     AMPICILLIN/SULBACTAM <=2 SENSITIVE Sensitive     PIP/TAZO <=4 SENSITIVE Sensitive     * >=100,000 COLONIES/mL ESCHERICHIA COLI  Culture, blood (x 2)     Status: None (Preliminary result)   Collection Time: 10/25/14 11:35 AM  Result Value Ref Range Status   Specimen Description BLOOD LEFT ARM  Final   Special Requests BOTTLES DRAWN AEROBIC AND ANAEROBIC 5CC  Final   Culture   Final    NO GROWTH 1 DAY Performed at [facility]    Report Status PENDING  Incomplete  Culture, blood (x 2)     Status: None (Preliminary result)   Collection Time: 10/25/14 11:45 AM  Result Value Ref Range Status   Specimen Description BLOOD RIGHT ARM  Final   Special Requests BOTTLES DRAWN AEROBIC ONLY [place]  Final   Culture   Final    NO GROWTH 1 DAY Performed at [facility]    Report Status PENDING  Incomplete  MRSA PCR Screening     Status: None   Collection Time: 10/25/14 12:31 PM  Result Value Ref Range Status   MRSA by PCR NEGATIVE NEGATIVE Final    Comment:        The GeneXpert MRSA Assay (FDA approved for NASAL specimens only), is one component of a comprehensive MRSA colonization surveillance program. It is not intended to diagnose MRSA infection nor to guide  or monitor treatment for MRSA infections.   Urine culture     Status: None (Preliminary result)   Collection Time: 10/25/14  3:12 PM  Result Value Ref Range Status   Specimen Description URINE, CLEAN CATCH  Final   Special Requests NONE  Final   Culture   Final    NO GROWTH < 24 HOURS Performed at [facility]    Report Status PENDING  Incomplete     Studies: Dg Chest Port 1 View  10/25/2014   CLINICAL DATA:  Recent fall from  bed with chest pain and weakness  EXAM: PORTABLE CHEST - 1 VIEW  COMPARISON:  01/13/2014  FINDINGS: Cardiac shadow is mildly enlarged in size. A small hiatal hernia is noted. Aortic calcifications are again seen. The lungs are well-aerated without focal infiltrate or sizable effusion.  IMPRESSION: Aortic atherosclerotic change.  No acute abnormality is seen.  Small hiatal hernia.   Electronically Signed   By: Inez Catalina M.D.   On: 10/25/2014 11:57    Scheduled Meds: . acetaminophen  650 mg Oral TID  . amLODipine  5 mg Oral Q breakfast  . ciprofloxacin  400 mg Intravenous Q12H  . losartan  100 mg Oral Q breakfast  . metoprolol tartrate  25 mg Oral BID  . mirabegron ER  25 mg Oral Daily  . pantoprazole  40 mg Oral Daily  . pyridOXINE  100 mg Oral Q breakfast  . sodium chloride  3 mL Intravenous Q12H  . traMADol  50 mg Oral q morning - 10a  . vancomycin  500 mg Oral 4 times per day  . vitamin C  500 mg Oral Q breakfast  . vitamin E  400 Units Oral Q breakfast   Continuous Infusions: . sodium chloride 75 mL/hr at 10/27/14 0520      Time spent: 25 minutes    Marithza Malachi, [place]  [facility] Pager [phone]. If 7PM-7AM, please contact night-coverage at [email or web address], password [facility] 10/27/2014, 9:29 AM  LOS: 2 days

## 2014-10-27 NOTE — Progress Notes (Signed)
Pt is tolerating clear liquid diet well. Bowel sounds active in all quads.  Abdomen is tender to palpation. Pt has passed two medium, dark watery/ red bloody stools during day shift. Hgb is 9.2 from 8.8 this morning. Pt is being followed by [facility], MD for possible advancement of diet or appropriate procedures.

## 2014-10-27 NOTE — Progress Notes (Signed)
Brandy Crawford 10:58 AM  Subjective: Patient still with some dark stools but none today and tolerating clear liquids and abdominal pain resolved and no new complaints  Objective: Vital signs stable afebrile no acute distress abdomen is soft nontender hemoglobin stable BUN normal  Assessment: Stable GI bleeding currently  Plan: Will watch one more day on clear liquids and then decide either to advance diet or proceed with appropriate procedures and patient agrees with the plan and the nurse will call me sooner when necessary signs of bleeding who I talked to  [facility]  Pager [phone] After 5PM or if no answer call [phone]

## 2014-10-28 DIAGNOSIS — R109 Unspecified abdominal pain: Secondary | ICD-10-CM

## 2014-10-28 LAB — CBC
HCT: 29.2 % — ABNORMAL LOW (ref 36.0–46.0)
Hemoglobin: 9.7 g/dL — ABNORMAL LOW (ref 12.0–15.0)
MCH: 31.9 pg (ref 26.0–34.0)
MCHC: 33.2 g/dL (ref 30.0–36.0)
MCV: 96.1 fL (ref 78.0–100.0)
PLATELETS: 378 10*3/uL (ref 150–400)
RBC: 3.04 MIL/uL — ABNORMAL LOW (ref 3.87–5.11)
RDW: 13.7 % (ref 11.5–15.5)
WBC: 9.9 10*3/uL (ref 4.0–10.5)

## 2014-10-28 LAB — URINE CULTURE: Culture: NO GROWTH

## 2014-10-28 MED ORDER — POLYETHYLENE GLYCOL 3350 17 GM/SCOOP PO POWD
238.0000 g | Freq: Once | ORAL | Status: AC
  Administered 2014-10-28: 238 g via ORAL
  Filled 2014-10-28: qty 238

## 2014-10-28 MED ORDER — SODIUM CHLORIDE 0.9 % IV SOLN
INTRAVENOUS | Status: DC
  Administered 2014-10-28: 10 mL/h via INTRAVENOUS

## 2014-10-28 MED ORDER — LORAZEPAM 2 MG/ML IJ SOLN
1.0000 mg | Freq: Once | INTRAMUSCULAR | Status: AC
  Administered 2014-10-29: 1 mg via INTRAVENOUS
  Filled 2014-10-28: qty 1

## 2014-10-28 NOTE — Progress Notes (Addendum)
[facility] PROGRESS NOTE  Brandy Crawford YKD:[id number] DOB: [birth year] DOA: 10/25/2014 PCP: Estill Dooms, MD Brief narrative 81 year old female with history of GERD, TIA, hypertension, hyperlipidemia sent from skilled nursing facility after sustaining a fall out of bed which appears to be mechanical. She was also having some dark stools at the facility. Patient was septic on presentation with significant leukocytosis and tachycardic with elevated lactate. UA was positive for UTI and patient had abdominal discomfort. CT of the abdomen and pelvis was unremarkable except for diverticulosis. Patient having several episodes of rectal bleed mixed with stool after being admitted to telemetry. [facility] following in consultation.  Assessment/Plan: Sepsis Resolved. Possibly related ischemic colitis and Ecoli UTI.  GI pathogen panel pending.  Continue IV cipro.   GI bleed Appears to be due to ischemic colitis given abdominal pain on presentation which actually improving. Still having dark stool mixed with bright red blood. H&H however remains stable and improved compared to yesterday. Discussed with Dr. Watt Climes who  recommends to monitor on clears for now without intervention.  Continue Protonix and monitor H&H closely.  Fall at nursing home Appears to be mechanical. Stable on telemetry.  History of TIA Holding aspirin  DVT prophylaxis: SCDs  Diet: Clears  CODE STATUS: DO NOT RESUSCITATE  Family communication: None at bedside  Disposition: Currently inpatient. Return to SNF once weekly stable and no intervention required (anticipate discharge on 7/18)  HPI/Subjective: Patient seen and examined. He has had 2 episodes of dark red in the day yesterday. Patient reports having 1-2 episode during the night as well. Has had abdominal discomfort. Tolerating clears.  Objective: Filed Vitals:   10/28/14 0952  BP: 151/68  Pulse: 84  Temp:   Resp:     Intake/Output Summary (Last 24  hours) at 10/28/14 1126 Last data filed at 10/28/14 0700  Gross per 24 hour  Intake   2275 ml  Output      0 ml  Net   2275 ml   Filed Weights   10/25/14 0634 10/25/14 1119  Weight: 68.04 kg (150 lb) 67.4 kg (148 lb 9.4 oz)    Exam:   General:   no acute distress  HEENT: Pallor present, moist oral mucosa,   Chest: Clear to auscultation bilaterally, no added sounds  CVS: Normal S1 and S2, no murmurs rub or gallop  GI: Soft, mild abdominal distention with minimal tenderness,,bowel sounds present  Musculoskeletal : Warm, no edema  CNS: Alert and oriented    Data Reviewed: Basic Metabolic Panel:  Recent Labs Lab 10/23/14 10/25/14 0714 10/26/14 0522  NA 141 134* 140  K 4.3 4.6 3.7  CL  --  105 110  CO2  --  23 24  GLUCOSE  --  164* 100*  BUN 15 28* 13  CREATININE 0.6 0.67 0.66  CALCIUM  --  9.5 9.0   Liver Function Tests:  Recent Labs Lab 10/23/14 10/25/14 0714  AST 15 23  ALT 16 22  ALKPHOS 58 55  BILITOT  --  0.9  PROT  --  6.4*  ALBUMIN  --  3.7    Recent Labs Lab 10/25/14 0731  LIPASE 10*   No results for input(s): AMMONIA in the last 168 hours. CBC:  Recent Labs Lab 10/23/14 10/25/14 0714  10/26/14 0522 10/26/14 1650 10/27/14 0537 10/27/14 1711 10/28/14 1059  WBC 7.8 20.2*  --  9.2  --   --   --  9.9  NEUTROABS  --  19.1*  --   --   --   --   --   --  HGB 12.5 11.0*  < > 8.8* 8.9* 8.8* 9.2* 9.7*  HCT 37 32.7*  < > 26.1* 26.2* 26.4* 27.4* 29.2*  MCV  --  92.1  --  95.3  --   --   --  96.1  PLT 323 333  --  285  --   --   --  378  < > = values in this interval not displayed. Cardiac Enzymes: No results for input(s): CKTOTAL, CKMB, CKMBINDEX, TROPONINI in the last 168 hours. BNP (last 3 results) No results for input(s): BNP in the last 8760 hours.  ProBNP (last 3 results) No results for input(s): PROBNP in the last 8760 hours.  CBG: No results for input(s): GLUCAP in the last 168 hours.  Recent Results (from the past 240  hour(s))  Urine culture     Status: None   Collection Time: 10/25/14  9:40 AM  Result Value Ref Range Status   Specimen Description URINE, CATHETERIZED  Final   Special Requests NONE  Final   Culture   Final    >=100,000 COLONIES/mL ESCHERICHIA COLI Performed at [facility]    Report Status 10/27/2014 FINAL  Final   Organism ID, Bacteria ESCHERICHIA COLI  Final      Susceptibility   Escherichia coli - MIC*    AMPICILLIN <=2 SENSITIVE Sensitive     CEFAZOLIN <=4 SENSITIVE Sensitive     CEFTRIAXONE <=1 SENSITIVE Sensitive     CIPROFLOXACIN <=0.25 SENSITIVE Sensitive     GENTAMICIN <=1 SENSITIVE Sensitive     IMIPENEM <=0.25 SENSITIVE Sensitive     NITROFURANTOIN <=16 SENSITIVE Sensitive     TRIMETH/SULFA <=20 SENSITIVE Sensitive     AMPICILLIN/SULBACTAM <=2 SENSITIVE Sensitive     PIP/TAZO <=4 SENSITIVE Sensitive     * >=100,000 COLONIES/mL ESCHERICHIA COLI  Culture, blood (x 2)     Status: None (Preliminary result)   Collection Time: 10/25/14 11:35 AM  Result Value Ref Range Status   Specimen Description BLOOD LEFT ARM  Final   Special Requests BOTTLES DRAWN AEROBIC AND ANAEROBIC 5CC  Final   Culture   Final    NO GROWTH 2 DAYS Performed at [facility]    Report Status PENDING  Incomplete  Culture, blood (x 2)     Status: None (Preliminary result)   Collection Time: 10/25/14 11:45 AM  Result Value Ref Range Status   Specimen Description BLOOD RIGHT ARM  Final   Special Requests BOTTLES DRAWN AEROBIC ONLY [place]  Final   Culture   Final    NO GROWTH 2 DAYS Performed at [facility]    Report Status PENDING  Incomplete  MRSA PCR Screening     Status: None   Collection Time: 10/25/14 12:31 PM  Result Value Ref Range Status   MRSA by PCR NEGATIVE NEGATIVE Final    Comment:        The GeneXpert MRSA Assay (FDA approved for NASAL specimens only), is one component of a comprehensive MRSA colonization surveillance program. It is not intended to  diagnose MRSA infection nor to guide or monitor treatment for MRSA infections.   Urine culture     Status: None (Preliminary result)   Collection Time: 10/25/14  3:12 PM  Result Value Ref Range Status   Specimen Description URINE, CLEAN CATCH  Final   Special Requests NONE  Final   Culture   Final    NO GROWTH < 24 HOURS Performed at [facility]    Report  Status PENDING  Incomplete     Studies: No results found.  Scheduled Meds: . acetaminophen  650 mg Oral TID  . amLODipine  5 mg Oral Q breakfast  . ciprofloxacin  400 mg Intravenous Q12H  . losartan  100 mg Oral Q breakfast  . metoprolol tartrate  25 mg Oral BID  . mirabegron ER  25 mg Oral Daily  . pantoprazole  40 mg Oral Daily  . pyridOXINE  100 mg Oral Q breakfast  . sodium chloride  3 mL Intravenous Q12H  . traMADol  50 mg Oral q morning - 10a  . vitamin C  500 mg Oral Q breakfast  . vitamin E  400 Units Oral Q breakfast   Continuous Infusions: . sodium chloride 75 mL/hr at 10/27/14 2302      Time spent: 25 minutes    Brandy Crawford, [place]  [facility] Pager [phone]. If 7PM-7AM, please contact night-coverage at [email or web address], password [facility] 10/28/2014, 11:26 AM  LOS: 3 days

## 2014-10-28 NOTE — Progress Notes (Signed)
Brandy Crawford 12:17 PM  Subjective: Patient continues to have black bowel movements and her abdominal pain is better and I had a very long conversation with her about probable ischemic colitis which I believe is what she had but she still is insistent on wanting procedures despite me discussing with them with her including the risks benefits and methods and her case was discussed with the hospital team as well Objective: Vital signs stable afebrile no acute distress abdomen is soft nontender hemoglobin stable  Assessment: Probable ischemic colitis without CT evidence  Plan: After our prolonged conversation regarding colonoscopy and if nondiagnostic and well-tolerated an endoscopy the patient really wants [place] to proceed tomorrow and we discussed the prep as well and I will have my partner proceed at [place] E  Pager [phone] After 5PM or if no answer call [phone]

## 2014-10-29 ENCOUNTER — Encounter (HOSPITAL_COMMUNITY)
Admission: EM | Disposition: A | Discharge status: Skilled Nursing Facility | Payer: Self-pay | Source: Home / Self Care | Attending: Internal Medicine

## 2014-10-29 ENCOUNTER — Encounter (HOSPITAL_COMMUNITY): Payer: Self-pay | Admitting: Certified Registered Nurse Anesthetist

## 2014-10-29 ENCOUNTER — Inpatient Hospital Stay (HOSPITAL_COMMUNITY): Payer: Medicare Other | Admitting: Anesthesiology

## 2014-10-29 DIAGNOSIS — G9340 Encephalopathy, unspecified: Secondary | ICD-10-CM

## 2014-10-29 LAB — BASIC METABOLIC PANEL
ANION GAP: 5 (ref 5–15)
BUN: <5 mg/dL — ABNORMAL LOW (ref 6–20)
CHLORIDE: 108 mmol/L (ref 101–111)
CO2: 26 mmol/L (ref 22–32)
CREATININE: 0.58 mg/dL (ref 0.44–1.00)
Calcium: 9.1 mg/dL (ref 8.9–10.3)
GFR calc Af Amer: >60 mL/min (ref >60)
GFR calc non Af Amer: >60 mL/min (ref >60)
Glucose, Bld: 167 mg/dL — ABNORMAL HIGH (ref 65–99)
Potassium: 3.9 mmol/L (ref 3.5–5.1)
Sodium: 139 mmol/L (ref 135–145)

## 2014-10-29 LAB — HEMOGLOBIN AND HEMATOCRIT, BLOOD
HCT: 25.1 % — ABNORMAL LOW (ref 36.0–46.0)
Hemoglobin: 8.4 g/dL — ABNORMAL LOW (ref 12.0–15.0)

## 2014-10-29 LAB — CBC
HCT: 28.6 % — ABNORMAL LOW (ref 36.0–46.0)
Hemoglobin: 9.5 g/dL — ABNORMAL LOW (ref 12.0–15.0)
MCH: 32.2 pg (ref 26.0–34.0)
MCHC: 33.2 g/dL (ref 30.0–36.0)
MCV: 96.9 fL (ref 78.0–100.0)
Platelets: 419 10*3/uL — ABNORMAL HIGH (ref 150–400)
RBC: 2.95 MIL/uL — AB (ref 3.87–5.11)
RDW: 14.2 % (ref 11.5–15.5)
WBC: 8 10*3/uL (ref 4.0–10.5)

## 2014-10-29 LAB — TSH: TSH: 2.17 u[IU]/mL (ref 0.350–4.500)

## 2014-10-29 SURGERY — CANCELLED PROCEDURE
Anesthesia: Monitor Anesthesia Care

## 2014-10-29 MED ORDER — HALOPERIDOL LACTATE 5 MG/ML IJ SOLN
2.5000 mg | Freq: Once | INTRAMUSCULAR | Status: AC
Start: 2014-10-29 — End: 2014-10-29
  Administered 2014-10-29: 2.5 mg via INTRAVENOUS
  Filled 2014-10-29: qty 1

## 2014-10-29 MED ORDER — PROPOFOL 10 MG/ML IV BOLUS
INTRAVENOUS | Status: AC
  Filled 2014-10-29: qty 20

## 2014-10-29 MED ORDER — EPHEDRINE SULFATE 50 MG/ML IJ SOLN
INTRAMUSCULAR | Status: AC
  Filled 2014-10-29: qty 1

## 2014-10-29 MED ORDER — PANTOPRAZOLE SODIUM 40 MG PO TBEC
40.0000 mg | DELAYED_RELEASE_TABLET | Freq: Two times a day (BID) | ORAL | Status: DC
  Administered 2014-10-29 – 2014-10-30 (×3): 40 mg via ORAL
  Filled 2014-10-29 (×3): qty 1

## 2014-10-29 MED ORDER — HALOPERIDOL LACTATE 5 MG/ML IJ SOLN: 1.0000 mg | Freq: Four times a day (QID) | INTRAMUSCULAR | Status: DC | PRN

## 2014-10-29 MED ORDER — LORAZEPAM 2 MG/ML IJ SOLN
INTRAMUSCULAR | Status: AC
Start: 2014-10-29 — End: 2014-10-29
  Filled 2014-10-29: qty 1

## 2014-10-29 MED ORDER — PHENYLEPHRINE HCL 10 MG/ML IJ SOLN
INTRAMUSCULAR | Status: AC
  Filled 2014-10-29: qty 1

## 2014-10-29 MED ORDER — QUETIAPINE FUMARATE 25 MG PO TABS
25.0000 mg | ORAL_TABLET | Freq: Every day | ORAL | Status: DC
  Administered 2014-10-29: 25 mg via ORAL
  Filled 2014-10-29: qty 1

## 2014-10-29 MED ORDER — SODIUM CHLORIDE 0.9 % IJ SOLN
INTRAMUSCULAR | Status: AC
  Filled 2014-10-29: qty 10

## 2014-10-29 MED ORDER — SODIUM CHLORIDE 0.9 % IV SOLN
INTRAVENOUS | Status: DC | PRN
  Administered 2014-10-29: 08:00:00 via INTRAVENOUS

## 2014-10-29 NOTE — Progress Notes (Signed)
Events over past 24 hours reviewed. Patient has become very agitated, disoriented, and confused.  There has been a slight drop in hemoglobin over the past 24 hours, from 9.7-8.4.  However, the patient's stool is currently completely nonbloody (incontinent of amber and green tinted fluid following her prep for the colonoscopy that going to be done this morning).  I had a 20 minute telephone conversation with the patient's daughter and health care power of attorney, [name] [phone]) who asked to be called prior to considering doing a colonoscopy and possible endoscopy, as were scheduled for this morning. Those tests were requested by the patient, who apparently has a fear of having colon cancer.   However, Izora Gala was appropriately raising questions about the risks benefits ratio, especially in an elderly patient who has somewhat marginal pulmonary status (dyspnea with mild exertion at baseline) and who has a tendency to become easily agitated.  I told Izora Gala that I agree with her. Since the patient is not showing evidence of current active bleeding (the mild drop in hemoglobin is more consistent with lab variation and equilibration), it is unlikely that the proposed procedures would be in any way therapeutic. They might give [place] a better understanding as to where her bleeding came from, but then the question is, is it worth putting her through the procedures, at her age and in her condition, for the benefit of that information.  After  the above discussion, both Izora Gala and I were very comfortable with continued medical therapy and observation, and canceling the proposed colonoscopy and upper endoscopy. We could revise that decision in the event that the patient has further active bleeding, so I will put her on a clear liquid diet for the next 24 hours so that, in case we do need to scope her, she does not have to be reprepped.  I have ordered additional lab work. For now, I will increase her  pantoprazole to 40 mg twice a day in case she does have aspirin/Aleve-induced gastropathy.  Cleotis Nipper, M.D. Pager [phone] If no answer or after 5 PM call [phone]

## 2014-10-29 NOTE — Progress Notes (Signed)
Family Notified and informed about the patient

## 2014-10-29 NOTE — OR Nursing (Signed)
Daughter and Brandy Crawford, [name], does not feel that surgery at this time would benefit the patient based on the patient's current condition.  Dr. Wallis Mart spoke with the daughter and they both agreed to cancel both procedures at this time per his council.    Laverta Baltimore, RN

## 2014-10-29 NOTE — Clinical Social Work Placement (Signed)
   CLINICAL SOCIAL WORK PLACEMENT  NOTE  Date:  10/29/2014  Patient Details  Name: Brandy Crawford MRN: [id number] Date of Birth: 08-Dec-1923  Clinical Social Work is seeking post-discharge placement for this patient at the [place] level of care (*CSW will initial, date and re-position this form in  chart as items are completed):  Yes   Patient/family provided with [place] Work Department's list of facilities offering this level of care within the geographic area requested by the patient (or if unable, by the patient's family).  Yes   Patient/family informed of their freedom to choose among providers that offer the needed level of care, that participate in Medicare, Medicaid or managed care program needed by the patient, have an available bed and are willing to accept the patient.  Yes   Patient/family informed of [facility]'s ownership interest in [facility] and [facility], as well as of the fact that they are under no obligation to receive care at these facilities.  PASRR submitted to EDS on 10/26/14     PASRR number received on       Existing PASRR number confirmed on 10/26/14     FL2 transmitted to all facilities in geographic area requested by pt/family on       FL2 transmitted to all facilities within larger geographic area on 10/26/14     Patient informed that his/her managed care company has contracts with or will negotiate with certain facilities, including the following:        Yes   Patient/family informed of bed offers received.  Patient chooses bed at [facility]     Physician recommends and patient chooses bed at      Patient to be transferred to   on  .  Patient to be transferred to facility by       Patient family notified on   of transfer.  Name of family member notified:        PHYSICIAN       Additional Comment:    _______________________________________________ Ladell Pier,  LCSW 10/29/2014, 2:28 PM

## 2014-10-29 NOTE — Progress Notes (Signed)
CSW continuing to follow for disposition planning.  Pt admitted from [facility], but [facility] does not have any skilled medicare certified beds available and plan is for pt to discharge to [facility] which has skilled medicare certified beds available and [facility] feels they can continue to meet pt needs.  CSW met with pt daughter, Izora Gala at bedside to provide support. CSW updated pt daughter regarding the bed availability at [facility]. Pt daughter agreeable to plan for [facility]. Pt daughter inquired about if facility provided one-to-one sitters at facility as pt daughter feels that pt will need a private sitter initially until pt can re-orient to facility. CSW discussed that facility could assist pt daughter in arranging private sitter, but private sitter is an out-of-pocket expense. Pt daughter expressed understanding. CSW notified [facility] of pt daughter's wishes to arrange private duty sitter and facility will assist pt daughter with this need.   CSW sent updated clinicals to [facility]. Per MD, hopeful pt can discharge to SNF on Tuesday 10/30/2014. Facility and pt daughter aware.  CSW to continue to follow to provide support and assist with pt disposition needs.   Alison Murray, MSW, [place] Work [phone]

## 2014-10-29 NOTE — Care Management Important Message (Signed)
Important Message  Patient Details  Name: Brandy Crawford MRN: [id number] Date of Birth: 06-10-1923   Medicare Important Message Given:  Yes-second notification given    Camillo Flaming 10/29/2014, 12:03 [place] Message  Patient Details  Name: Brandy Crawford MRN: [id number] Date of Birth: Oct 23, 1923   Medicare Important Message Given:  Yes-second notification given    Camillo Flaming 10/29/2014, 12:03 PM

## 2014-10-29 NOTE — Progress Notes (Signed)
Patient was confused, agitated and combative. Patient pulled her IV, Removed her heart monitor and tried to get up in the bed. While RN trying to get a new IV site patient tried to bite, punch and kick staffs. N.P. Informed. And she ordered wrist restraints and Haldol 2.5mg . Will continue to monitor the pt. RN tried to call family but they did answer. Try to call them again this morning.

## 2014-10-29 NOTE — Progress Notes (Signed)
Physical Therapy Treatment Patient Details Name: Brandy Crawford MRN: [id number] DOB: 04/15/1923 Today's Date: 10/29/2014    History of Present Illness 81 year old female with history of GERD, TIA, hypertension, hyperlipidemia from ALF and admitted for GI bleed and fall (likely mechanical - reaching for object while sitting on bed)    PT Comments    Pt with decreased cognition today however cooperative and able to follow commands.  Pt with urinary incontinence upon standing twice so transferred to [facility] and then ambulated in hallway.  Daughter agreeable with plan for SNF at this time.   Follow Up Recommendations  SNF     Equipment Recommendations  None recommended by PT    Recommendations for Other Services       Precautions / Restrictions Precautions Precautions: Fall Precaution Comments: incontinent    Mobility  Bed Mobility Overal bed mobility: Needs Assistance Bed Mobility: Supine to Sit;Sit to Supine     Supine to sit: Min assist Sit to supine: Min guard   General bed mobility comments: assist for trunk upright, verbal cues for safety  Transfers Overall transfer level: Needs assistance Equipment used: Rolling walker (2 wheeled) Transfers: Sit to/from [facility] Sit to Stand: Min assist Stand pivot transfers: Min assist       General transfer comment: verbal cues for safety, assist to rise and steady  Ambulation/Gait Ambulation/Gait assistance: Min assist Ambulation Distance (Feet): 60 Feet Assistive device: Rolling walker (2 wheeled) Gait Pattern/deviations: Step-through pattern;Step-to pattern;Wide base of support     General Gait Details: initially step to gait leading with R LE, unsteady requiring assist however improved with distance, fatigues quickly however baseline per daughter (typically has to take rest breaks when ambulating to dining hall)   Financial trader [place] (Stroke Patients  Only)       Balance                                    Cognition Arousal/Alertness: Awake/alert Behavior During Therapy: Restless Overall Cognitive Status: Impaired/Different from baseline Area of Impairment: Orientation Orientation Level: Disoriented to;Situation;Time             General Comments: a little more groggy and disorientated today compared to baseline per daughter    Exercises      General Comments        Pertinent Vitals/Pain Pain Assessment: No/denies pain    Home Living                      Prior Function            PT Goals (current goals can now be found in the care plan section) Progress towards PT goals: Progressing toward goals    Frequency  Min 3X/week    PT Plan Current plan remains appropriate    Co-evaluation             End of Session Equipment Utilized During Treatment: Gait belt Activity Tolerance: Patient limited by fatigue Patient left: in bed;with call bell/phone within reach;with bed alarm set;with family/visitor present     Time: 5956-3875 PT Time Calculation (min) (ACUTE ONLY): 26 min  Charges:  $Gait Training: 8-22 mins $Therapeutic Activity: 8-22 mins                    G Codes:  Corleen Otwell,KATHrine E 10/29/2014, 2:11 PM Carmelia Bake, PT, DPT 10/29/2014 Pager: [phone]

## 2014-10-29 NOTE — Anesthesia Preprocedure Evaluation (Addendum)
Anesthesia Evaluation  Patient identified by MRN, date of birth, ID band Patient awake    Reviewed: Allergy & Precautions, NPO status , Patient's Chart, lab work & pertinent test results  History of Anesthesia Complications Negative for: history of anesthetic complications  Airway Mallampati: III  TM Distance: >3 FB Neck ROM: Full    Dental no notable dental hx. (+) Dental Advisory Given, Poor Dentition   Pulmonary asthma ,  Hx of recurrent bronchitis  breath sounds clear to auscultation  Pulmonary exam normal       Cardiovascular hypertension, Pt. on medications Normal cardiovascular examRhythm:Regular Rate:Normal     Neuro/Psych PSYCHIATRIC DISORDERS Anxiety Depression Dementia, altered TIA   GI/Hepatic Neg liver ROS, GERD-  Medicated and Controlled,  Endo/Other  negative endocrine ROS  Renal/GU negative Renal ROS  negative genitourinary   Musculoskeletal  (+) Arthritis -, Osteoarthritis,    Abdominal   Peds negative pediatric ROS (+)  Hematology  (+) anemia ,   Anesthesia Other Findings   Reproductive/Obstetrics negative OB ROS                            Anesthesia Physical Anesthesia Plan  ASA: III  Anesthesia Plan: MAC   Post-op Pain Management:    Induction: Intravenous  Airway Management Planned:   Additional Equipment:   Intra-op Plan:   Post-operative Plan:   Informed Consent: I have reviewed the patients History and Physical, chart, labs and discussed the procedure including the risks, benefits and alternatives for the proposed anesthesia with the patient or authorized representative who has indicated his/her understanding and acceptance.   Dental advisory given and Consent reviewed with POA  Plan Discussed with: CRNA  Anesthesia Plan Comments: (Discussed with daughter, [name], who was aware of her mothers agitation and altered mental status. Discussed  and reviewed medical history and denies any problems with anesthesia in past. Discussed that we would like to attempt this procedure under sedation but may need to convert to general anesthesia. Patient is appropriately NPO. Discussed r/b/o. Daughter would really like to attempt sedation first as to avoid an ETT but is understanding and would be ok if general anesthesia was required. Understanding of also possibility that ETT could have to remain in place after procedure. Discussed all r/b/o of anesthesia and all questions were answered. Daughter would like to discuss the need for this procedure with Dr. Cristina Gong first and then if she decides it is necessary to move forward, is ok with the anesthesia plan. )       Anesthesia Quick Evaluation

## 2014-10-29 NOTE — Progress Notes (Signed)
[facility] PROGRESS NOTE  Brandy Crawford JJK:[id number] DOB: [birth year] DOA: 10/25/2014 PCP: Estill Dooms, MD Brief narrative 81 year old female with history of GERD, TIA, hypertension, hyperlipidemia sent from skilled nursing facility after sustaining a fall out of bed which appears to be mechanical. She was also having some dark stools at the facility. Patient was septic on presentation with significant leukocytosis and tachycardic with elevated lactate. UA was positive for UTI and patient had abdominal discomfort. CT of the abdomen and pelvis was unremarkable except for diverticulosis. Patient having several episodes of rectal bleed mixed with stool after being admitted to telemetry. [facility] following in consultation.  Assessment/Plan:  GI bleed Appears to be due to ischemic colitis given abdominal pain on presentation which actually improving. Still having dark stool mixed with bright red blood. H&H however remains stable and improved compared to yesterday. GI discussed with the daughter and will be done and that seemed patient not having active bleeding with stable H&H, continuing medical therapy with observation was the best course of action for now. Increase PPI dose to twice daily.  acute encephalopathy on 7/17-7/18 During the night patient was hallucinating seeing a Chinese: Interval, sweating,, tremulous and agitated and combative. She pulled out her IV and Demerol heart monitor and was kicking the nursing staff. She was placed on wrist restraints and given a dose of IV Haldol. This morning patient is still quite confused and restless. Motor bedside reports that patient has had similar symptoms of anxiety and confusion during hospitalization . She is currently on ciprofloxacin for Escherichia coli UTI. -Discontinue tramadol. I will monitor her on when necessary IV Haldol and order twice a day seroquel.  Fall at nursing home Appears to be mechanical. Stable on  telemetry.  History of TIA Holding aspirin  DVT prophylaxis: SCDs  Diet: Clears  CODE STATUS: DO NOT RESUSCITATE  Family communication: None at bedside  Disposition: Currently inpatient. Return to SNF possibly tomorrow if mentation improves and H&H stable.  HPI/Subjective: Patient seen and examined. Reportedly was very confused, combative and agitated overnight requiring wrist restraints and IV Haldol. Motor bedside reports patient has shown similar behavior in the past.  Objective: Filed Vitals:   10/29/14 1423  BP: 162/64  Pulse: 64  Temp: 98.5 F (36.9 C)  Resp:     Intake/Output Summary (Last 24 hours) at 10/29/14 1458 Last data filed at 10/29/14 1200  Gross per 24 hour  Intake 981.17 ml  Output      0 ml  Net 981.17 ml   Filed Weights   10/25/14 0634 10/25/14 1119  Weight: 68.04 kg (150 lb) 67.4 kg (148 lb 9.4 oz)    Exam:   General:   Elderly female lying in bed somnolent and confused, restless at times  HEENT: moist oral mucosa,   Chest: Clear to auscultation bilaterally, no added sounds  CVS: Normal S1 and S2, no murmurs rub or gallop  GI: Soft, nontender, nondistended, bowel sounds present  Musculoskeletal : Warm, no edema  CNS: Confused and restless    Data Reviewed: Basic Metabolic Panel:  Recent Labs Lab 10/23/14 10/25/14 0714 10/26/14 0522  NA 141 134* 140  K 4.3 4.6 3.7  CL  --  105 110  CO2  --  23 24  GLUCOSE  --  164* 100*  BUN 15 28* 13  CREATININE 0.6 0.67 0.66  CALCIUM  --  9.5 9.0   Liver Function Tests:  Recent Labs Lab 10/23/14 10/25/14 0714  AST 15  23  ALT 16 22  ALKPHOS 58 55  BILITOT  --  0.9  PROT  --  6.4*  ALBUMIN  --  3.7    Recent Labs Lab 10/25/14 0731  LIPASE 10*   No results for input(s): AMMONIA in the last 168 hours. CBC:  Recent Labs Lab 10/23/14 10/25/14 0714  10/26/14 0522 10/26/14 1650 10/27/14 0537 10/27/14 1711 10/28/14 1059 10/29/14 0413  WBC 7.8 20.2*  --  9.2  --   --    --  9.9  --   NEUTROABS  --  19.1*  --   --   --   --   --   --   --   HGB 12.5 11.0*  < > 8.8* 8.9* 8.8* 9.2* 9.7* 8.4*  HCT 37 32.7*  < > 26.1* 26.2* 26.4* 27.4* 29.2* 25.1*  MCV  --  92.1  --  95.3  --   --   --  96.1  --   PLT 323 333  --  285  --   --   --  378  --   < > = values in this interval not displayed. Cardiac Enzymes: No results for input(s): CKTOTAL, CKMB, CKMBINDEX, TROPONINI in the last 168 hours. BNP (last 3 results) No results for input(s): BNP in the last 8760 hours.  ProBNP (last 3 results) No results for input(s): PROBNP in the last 8760 hours.  CBG: No results for input(s): GLUCAP in the last 168 hours.  Recent Results (from the past 240 hour(s))  Urine culture     Status: None   Collection Time: 10/25/14  9:40 AM  Result Value Ref Range Status   Specimen Description URINE, CATHETERIZED  Final   Special Requests NONE  Final   Culture   Final    >=100,000 COLONIES/mL ESCHERICHIA COLI Performed at [facility]    Report Status 10/28/2014 FINAL  Final   Organism ID, Bacteria ESCHERICHIA COLI  Final      Susceptibility   Escherichia coli - MIC*    AMPICILLIN <=2 SENSITIVE Sensitive     CEFAZOLIN <=4 SENSITIVE Sensitive     CEFTRIAXONE <=1 SENSITIVE Sensitive     CIPROFLOXACIN <=0.25 SENSITIVE Sensitive     GENTAMICIN <=1 SENSITIVE Sensitive     IMIPENEM <=0.25 SENSITIVE Sensitive     NITROFURANTOIN <=16 SENSITIVE Sensitive     TRIMETH/SULFA <=20 SENSITIVE Sensitive     AMPICILLIN/SULBACTAM <=2 SENSITIVE Sensitive     PIP/TAZO <=4 SENSITIVE Sensitive     * >=100,000 COLONIES/mL ESCHERICHIA COLI  Culture, blood (x 2)     Status: None (Preliminary result)   Collection Time: 10/25/14 11:35 AM  Result Value Ref Range Status   Specimen Description BLOOD LEFT ARM  Final   Special Requests BOTTLES DRAWN AEROBIC AND ANAEROBIC 5CC  Final   Culture   Final    NO GROWTH 4 DAYS Performed at [facility]    Report Status PENDING  Incomplete   Culture, blood (x 2)     Status: None (Preliminary result)   Collection Time: 10/25/14 11:45 AM  Result Value Ref Range Status   Specimen Description BLOOD RIGHT ARM  Final   Special Requests BOTTLES DRAWN AEROBIC ONLY [place]  Final   Culture   Final    NO GROWTH 4 DAYS Performed at [facility]    Report Status PENDING  Incomplete  MRSA PCR Screening     Status: None   Collection Time: 10/25/14 12:31  PM  Result Value Ref Range Status   MRSA by PCR NEGATIVE NEGATIVE Final    Comment:        The GeneXpert MRSA Assay (FDA approved for NASAL specimens only), is one component of a comprehensive MRSA colonization surveillance program. It is not intended to diagnose MRSA infection nor to guide or monitor treatment for MRSA infections.   Urine culture     Status: None   Collection Time: 10/25/14  3:12 PM  Result Value Ref Range Status   Specimen Description URINE, CLEAN CATCH  Final   Special Requests NONE  Final   Culture   Final    NO GROWTH 2 DAYS Performed at [facility]    Report Status 10/28/2014 FINAL  Final     Studies: No results found.  Scheduled Meds: . acetaminophen  650 mg Oral TID  . amLODipine  5 mg Oral Q breakfast  . ciprofloxacin  400 mg Intravenous Q12H  . losartan  100 mg Oral Q breakfast  . metoprolol tartrate  25 mg Oral BID  . mirabegron ER  25 mg Oral Daily  . pantoprazole  40 mg Oral BID  . pyridOXINE  100 mg Oral Q breakfast  . sodium chloride  3 mL Intravenous Q12H  . traMADol  50 mg Oral q morning - 10a  . vitamin C  500 mg Oral Q breakfast  . vitamin E  400 Units Oral Q breakfast   Continuous Infusions: . sodium chloride 75 mL/hr at 10/27/14 2302  . sodium chloride 10 mL/hr (10/28/14 1253)      Time spent: 25 minutes    Cassady Stanczak, [facility]  [facility] Pager [phone]. If 7PM-7AM, please contact night-coverage at [email or web address], password [facility] 10/29/2014, 2:58 PM  LOS: 4 days

## 2014-10-29 NOTE — Progress Notes (Signed)
Patient had displayed symptoms of panic attack. She was hallucinating, she said that there was a chinese girl in the other room trying to enter her room. She was sweating, shaking and agitated. She appeared to be scared with disorganized thoughts. Patient's daughter and niece were made aware. Daughter said that these symptoms are not new to patient. They thought patient has dementia but not in any medications for it. Patient's niece came over and consoled the patient and patient has calmed down. I did not give the ativan that Tylene Fantasia NP ordered after her condition changed. We will continue to monitor patient.

## 2014-10-30 DIAGNOSIS — N3281 Overactive bladder: Secondary | ICD-10-CM | POA: Diagnosis not present

## 2014-10-30 DIAGNOSIS — M4806 Spinal stenosis, lumbar region: Secondary | ICD-10-CM | POA: Diagnosis not present

## 2014-10-30 DIAGNOSIS — N390 Urinary tract infection, site not specified: Secondary | ICD-10-CM | POA: Diagnosis not present

## 2014-10-30 DIAGNOSIS — W19XXXA Unspecified fall, initial encounter: Secondary | ICD-10-CM | POA: Diagnosis not present

## 2014-10-30 DIAGNOSIS — R296 Repeated falls: Secondary | ICD-10-CM | POA: Diagnosis not present

## 2014-10-30 DIAGNOSIS — D649 Anemia, unspecified: Secondary | ICD-10-CM | POA: Diagnosis not present

## 2014-10-30 DIAGNOSIS — D62 Acute posthemorrhagic anemia: Secondary | ICD-10-CM | POA: Diagnosis not present

## 2014-10-30 DIAGNOSIS — K922 Gastrointestinal hemorrhage, unspecified: Secondary | ICD-10-CM | POA: Diagnosis not present

## 2014-10-30 DIAGNOSIS — R2681 Unsteadiness on feet: Secondary | ICD-10-CM | POA: Diagnosis not present

## 2014-10-30 DIAGNOSIS — F328 Other depressive episodes: Secondary | ICD-10-CM | POA: Diagnosis not present

## 2014-10-30 DIAGNOSIS — K2971 Gastritis, unspecified, with bleeding: Secondary | ICD-10-CM | POA: Diagnosis not present

## 2014-10-30 DIAGNOSIS — R269 Unspecified abnormalities of gait and mobility: Secondary | ICD-10-CM | POA: Diagnosis not present

## 2014-10-30 DIAGNOSIS — R197 Diarrhea, unspecified: Secondary | ICD-10-CM | POA: Diagnosis not present

## 2014-10-30 DIAGNOSIS — B9620 Unspecified Escherichia coli [E. coli] as the cause of diseases classified elsewhere: Secondary | ICD-10-CM | POA: Diagnosis not present

## 2014-10-30 DIAGNOSIS — E785 Hyperlipidemia, unspecified: Secondary | ICD-10-CM | POA: Diagnosis not present

## 2014-10-30 DIAGNOSIS — Z8679 Personal history of other diseases of the circulatory system: Secondary | ICD-10-CM | POA: Diagnosis not present

## 2014-10-30 DIAGNOSIS — S82001D Unspecified fracture of right patella, subsequent encounter for closed fracture with routine healing: Secondary | ICD-10-CM | POA: Diagnosis not present

## 2014-10-30 DIAGNOSIS — G459 Transient cerebral ischemic attack, unspecified: Secondary | ICD-10-CM | POA: Diagnosis not present

## 2014-10-30 DIAGNOSIS — K921 Melena: Secondary | ICD-10-CM | POA: Diagnosis not present

## 2014-10-30 DIAGNOSIS — H81399 Other peripheral vertigo, unspecified ear: Secondary | ICD-10-CM | POA: Diagnosis not present

## 2014-10-30 DIAGNOSIS — K219 Gastro-esophageal reflux disease without esophagitis: Secondary | ICD-10-CM | POA: Diagnosis not present

## 2014-10-30 DIAGNOSIS — M5137 Other intervertebral disc degeneration, lumbosacral region: Secondary | ICD-10-CM | POA: Diagnosis not present

## 2014-10-30 DIAGNOSIS — M6281 Muscle weakness (generalized): Secondary | ICD-10-CM | POA: Diagnosis not present

## 2014-10-30 DIAGNOSIS — E162 Hypoglycemia, unspecified: Secondary | ICD-10-CM | POA: Diagnosis not present

## 2014-10-30 DIAGNOSIS — J4520 Mild intermittent asthma, uncomplicated: Secondary | ICD-10-CM | POA: Diagnosis not present

## 2014-10-30 DIAGNOSIS — A4151 Sepsis due to Escherichia coli [E. coli]: Secondary | ICD-10-CM | POA: Diagnosis not present

## 2014-10-30 DIAGNOSIS — I10 Essential (primary) hypertension: Secondary | ICD-10-CM | POA: Diagnosis not present

## 2014-10-30 DIAGNOSIS — J45998 Other asthma: Secondary | ICD-10-CM | POA: Diagnosis not present

## 2014-10-30 DIAGNOSIS — M25529 Pain in unspecified elbow: Secondary | ICD-10-CM | POA: Diagnosis not present

## 2014-10-30 DIAGNOSIS — R41841 Cognitive communication deficit: Secondary | ICD-10-CM | POA: Diagnosis not present

## 2014-10-30 DIAGNOSIS — A419 Sepsis, unspecified organism: Secondary | ICD-10-CM | POA: Diagnosis not present

## 2014-10-30 DIAGNOSIS — J158 Pneumonia due to other specified bacteria: Secondary | ICD-10-CM | POA: Diagnosis not present

## 2014-10-30 HISTORY — DX: Gastrointestinal hemorrhage, unspecified: K92.2

## 2014-10-30 LAB — GI PATHOGEN PANEL BY PCR, STOOL
CAMPYLOBACTER BY PCR: NOT DETECTED
Cryptosporidium by PCR: NOT DETECTED
E COLI 0157 BY PCR: NOT DETECTED
E coli (ETEC) LT/ST: NOT DETECTED
E coli (STEC): NOT DETECTED
G LAMBLIA BY PCR: NOT DETECTED
Norovirus GI/GII: NOT DETECTED
Rotavirus A by PCR: NOT DETECTED
SALMONELLA BY PCR: NOT DETECTED
Shigella by PCR: NOT DETECTED

## 2014-10-30 LAB — CULTURE, BLOOD (ROUTINE X 2)
CULTURE: NO GROWTH
CULTURE: NO GROWTH

## 2014-10-30 LAB — CBC
HCT: 28.7 % — ABNORMAL LOW (ref 36.0–46.0)
Hemoglobin: 9.5 g/dL — ABNORMAL LOW (ref 12.0–15.0)
MCH: 31.4 pg (ref 26.0–34.0)
MCHC: 33.1 g/dL (ref 30.0–36.0)
MCV: 94.7 fL (ref 78.0–100.0)
Platelets: 393 10*3/uL (ref 150–400)
RBC: 3.03 MIL/uL — AB (ref 3.87–5.11)
RDW: 14.3 % (ref 11.5–15.5)
WBC: 6.8 10*3/uL (ref 4.0–10.5)

## 2014-10-30 LAB — VITAMIN B12: Vitamin B-12: 387 pg/mL (ref 180–914)

## 2014-10-30 MED ORDER — TRAMADOL HCL 50 MG PO TABS: 50.0000 mg | ORAL_TABLET | Freq: Two times a day (BID) | ORAL | Status: DC | PRN

## 2014-10-30 MED ORDER — PANTOPRAZOLE SODIUM 40 MG PO TBEC: 40.0000 mg | DELAYED_RELEASE_TABLET | Freq: Every day | ORAL | Status: DC

## 2014-10-30 MED ORDER — CIPROFLOXACIN HCL 500 MG PO TABS: 500.0000 mg | ORAL_TABLET | Freq: Two times a day (BID) | ORAL | Status: AC

## 2014-10-30 NOTE — Progress Notes (Signed)
Patient is sleeping, did not awaken. Per discussion with the aide, no bowel movements so far this morning.  Hemoglobin is stable.  Impression: Quiescent GI bleed, almost certainly of lower tract origin, presumably diverticular  Recommendation: We will sign off. Per discussion with patient's daughter yesterday, non-essential scoping not desirable in view of patient's age and medical status  I would recommend continuing twice-daily PPI therapy while in-house, then reducing it to once daily dosing at the time of discharge. I would maintain her on PPI therapy indefinitely for ulcer prophylaxis.   Ideally, the patient would be kept off aspirin for the next several weeks to allow maturation of clot, wherever her hemostasis has occurred. Thereafter, resumption of aspirin (while maintaining her on PPI prophylaxis) would be acceptable from the GI standpoint, if it is felt to be strongly desirable for medical reasons such as stroke prophylaxis.  Please call if you have questions, or if we can be of further assistance with this patient.  Cleotis Nipper, M.D. Pager [phone] If no answer or after 5 PM call [phone]

## 2014-10-30 NOTE — Discharge Instructions (Signed)
Gastrointestinal Bleeding °Gastrointestinal bleeding is bleeding somewhere along the path that food travels through the body (digestive tract). This path is anywhere between the mouth and the opening of the butt (anus). You may have blood in your throw up (vomit) or in your poop (stools). If there is a lot of bleeding, you may need to stay in the hospital. °HOME CARE °· Only take medicine as told by your doctor. °· Eat foods with fiber such as whole grains, fruits, and vegetables. You can also try eating 1 to 3 prunes a day. °· Drink enough fluids to keep your pee (urine) clear or pale yellow. °GET HELP RIGHT AWAY IF:  °· Your bleeding gets worse. °· You feel dizzy, weak, or you pass out (faint). °· You have bad cramps in your back or belly (abdomen). °· You have large blood clumps (clots) in your poop. °· Your problems are getting worse. °MAKE SURE YOU:  °· Understand these instructions. °· Will watch your condition. °· Will get help right away if you are not doing well or get worse. °Document Released: 01/07/2008 Document Revised: 03/16/2012 Document Reviewed: 03/09/2011 °[employer]® Patient Information ©2015 [employer]. This information is not intended to replace advice given to you by your health care provider. Make sure you discuss any questions you have with your health care provider. ° ° °

## 2014-10-30 NOTE — Progress Notes (Signed)
Pt for discharge to [facility] SNF.   CSW facilitated pt discharge needs including contacting facility, faxing pt discharge information to [facility], discussing with pt daughter, Izora Gala at bedside, providing RN phone number to call report, and scheduled ambulance transport for pt pick up at 1:15 pm.   Pt daughter stated that she was able to visit [place] yesterday and is pleased that [facility] is able to accept pt as [facility] was currently full. Pt daughter appreciative of CSW support and assistance.   No further social work needs identified at this time.  CSW signing off.   Alison Murray, MSW, [facility] [employer] [phone]

## 2014-10-30 NOTE — Discharge Summary (Signed)
Physician Discharge Summary  Brandy Crawford QQI:[id number] DOB: May 04, 1923 DOA: 10/25/2014  PCP: Estill Dooms, MD  Admit date: 10/25/2014 Discharge date: 10/30/2014  Time spent: 35 minutes  Recommendations for Outpatient Follow-up:  1. Discharge skilled nursing facility. Please monitor H&H in next 2-3  days 2. Aspirin has been discontinued and recommend to hold for 6-8 weeks. 3. Patient should be on daily PPI indefinitely. 4. Patient will complete course of antibiotic (ciprofloxacin) on 7/20  Discharge Diagnoses:  Principal Problem:   Acute lower GI bleeding   Active Problems:   Sepsis   Escherichia coli UTI   Acute blood loss anemia   Hyperlipidemia   Overactive bladder   Diarrhea   Right sided abdominal pain   Fall at nursing home  acute encephalopathy    Discharge Condition: Fair  Diet recommendation: Soft, advance as abdominal discomfort improves. ( in 1-2 days)  Filed Weights   10/25/14 0634 10/25/14 1119  Weight: 68.04 kg (150 lb) 67.4 kg (148 lb 9.4 oz)    History of present illness:  Please refer to admission H&P for details, and brief, 81 year old female with history of GERD, TIA, hypertension, hyperlipidemia sent from skilled nursing facility after sustaining a fall out of bed which appears to be mechanical. She was also having some dark stools at the facility. Patient was septic on presentation with significant leukocytosis and tachycardic with elevated lactate. UA was positive for UTI and patient had abdominal discomfort. CT of the abdomen and pelvis was unremarkable except for diverticulosis. Patient having several episodes of rectal bleed mixed with stool after being admitted to telemetry. [facility] following in consultation.  Hospital Course:  Lower GI bleed Possibly diverticular. H&H however remains stable after an initial drop with no further rectal bleed for the past 24 hours. Appreciate GI recommendations. Given improvement in symptoms and H&H  being stable recommended for conservative management. Patient stable to be discharged to skilled nursing facility and followed up as outpatient. -She has been started on PPI daily and recommend to be on it indefinitely for ulcer prophylaxis. -Aspirin should be held for several weeks ( 6-8 weeks or so) and if no further symptoms in future can be resumed. -Recommend to follow H&H in next 2-3 days.  Sepsis secondary to lower GI bleed and Escherichia coli UTI Present on admission and has resolved. On ciprofloxacin with plan to treat for 7 day course.  acute encephalopathy on 7/17-7/18 Symptoms of hallucinations, agitation and combative behavior noted. Was monitored with needle when necessary Haldol and discontinued pain medications. As per daughter patient has had similar symptoms in the past. Patient possibly had one done in symptoms. He has improved back to normal since past 12 hours.   Fall at nursing home Appears to be mechanical. Stable on telemetry.  History of TIA Holding aspirin for several weeks for now.  Essential hypertension Stable. Resume blood pressure medications.  Since seen by physical therapy and recommends skilled nursing facility. Patient is clinically stable to be discharged.  I have also instructed patient not to drive as per her daughter's request. patient agrees for it.  CODE STATUS: DO NOT RESUSCITATE  Family communication: Daughter at bedside  Disposition: To skilled nursing facility    Discharge Exam: Filed Vitals:   10/30/14 0545  BP: 138/72  Pulse: 75  Temp: 98.3 F (36.8 C)  Resp: 18    General: Elderly female lying in bed in no acute distress  HEENT: Her present, moist oral mucosa, supple neck  Chest:  Clear to auscultation bilaterally, no added sounds  CVS: Normal S1 and S2, no murmurs rub or gallop  GI: Soft, nontender, mild distention, bowel sounds present  Musculoskeletal : Warm, no edema  CNS: Alert and oriented  Discharge  Instructions    Current Discharge Medication List    START taking these medications   Details  ciprofloxacin (CIPRO) 500 MG tablet Take 1 tablet (500 mg total) by mouth 2 (two) times daily. Qty: 4 tablet, Refills: 0    pantoprazole (PROTONIX) 40 MG tablet Take 1 tablet (40 mg total) by mouth daily. Qty: 30 tablet, Refills: 0      CONTINUE these medications which have CHANGED   Details  traMADol (ULTRAM) 50 MG tablet Take 1 tablet (50 mg total) by mouth every 12 (twelve) hours as needed for moderate pain. Qty: 60 tablet, Refills: 2      CONTINUE these medications which have NOT CHANGED   Details  acetaminophen (TYLENOL) 325 MG tablet Take 650 mg by mouth 3 (three) times daily. Also take 650mg  for fever >101 x1 dose. And notify MD    albuterol (PROVENTIL HFA;VENTOLIN HFA) 108 (90 BASE) MCG/ACT inhaler Inhale 2 puffs into the lungs every 6 (six) hours as needed for wheezing or shortness of breath. Qty: 1 Inhaler, Refills: 3    amLODipine (NORVASC) 5 MG tablet Take 5 mg by mouth daily with breakfast.     Ascorbic Acid (VITAMIN C) 500 MG tablet Take 500 mg by mouth daily with breakfast.     cholestyramine (QUESTRAN) 4 G packet Take 4 g by mouth 2 (two) times daily.    loperamide (IMODIUM) 2 MG capsule Take 2-4 mg by mouth as needed for diarrhea or loose stools. For the initial dose take mg, and then take 2mg  after each loose stool for 48 hours max.    losartan (COZAAR) 100 MG tablet Take 100 mg by mouth daily with breakfast.     Mirabegron ER (MYRBETRIQ) 25 MG TB24 Take 25 mg by mouth daily with breakfast. Prescribed by urology    pyridOXINE (VITAMIN B-6) 100 MG tablet Take 100 mg by mouth daily with breakfast.     vitamin E (VITAMIN E) 400 UNIT capsule Take 400 Units by mouth daily with breakfast.       STOP taking these medications     aspirin 81 MG chewable tablet        Allergies  Allergen Reactions  . Tramadol Other (See Comments)    Hallucinations,  agitation/combativeness  . Amoxicillin     Per MAR  . Oxycodone Other (See Comments)    Per MAR   . Sulfamethoxazole     Per MAR   . Penicillins Rash   Follow-up Information    Follow up with GREEN, Viviann Spare, MD.   Specialty:  Internal Medicine   Why:  1 week. ( at [facility])   Contact information:   [place] [address] [phone]        The results of significant diagnostics from this hospitalization (including imaging, microbiology, ancillary and laboratory) are listed below for reference.    Significant Diagnostic Studies: Ct Head Wo Contrast  10/25/2014   CLINICAL DATA:  Head trauma with scalp laceration secondary to a fall today in her room.  EXAM: CT HEAD WITHOUT CONTRAST  TECHNIQUE: Contiguous axial images were obtained from the base of the skull through the vertex without intravenous contrast.  COMPARISON:  CT scan dated 04/28/2005 and brain MR dated 07/01/2005  FINDINGS: No mass lesion. No midline shift. No acute hemorrhage or hematoma. No extra-axial fluid collections. No evidence of acute infarction. There are scattered areas of periventricular white matter lucency consistent with chronic small vessel ischemic disease. There is diffuse cerebral cortical atrophy with secondary ventricular dilatation, slightly progressed since 2007.  No acute osseous abnormality. Marked degenerative changes at the articulation of the odontoid with the anterior arch of C1.  IMPRESSION: No acute intracranial abnormality. Atrophy with chronic small vessel ischemic disease, slightly progressed since 2007.   Electronically Signed   By: Lorriane Shire M.D.   On: 10/25/2014 08:42   Dg Chest Port 1 View  10/25/2014   CLINICAL DATA:  Recent fall from bed with chest pain and weakness  EXAM: PORTABLE CHEST - 1 VIEW  COMPARISON:  01/13/2014  FINDINGS: Cardiac shadow is mildly enlarged in size. A small hiatal hernia is noted. Aortic calcifications are again seen. The lungs are well-aerated  without focal infiltrate or sizable effusion.  IMPRESSION: Aortic atherosclerotic change.  No acute abnormality is seen.  Small hiatal hernia.   Electronically Signed   By: Inez Catalina M.D.   On: 10/25/2014 11:57   Ct Angio Abd/pel W/ And/or W/o  10/25/2014   CLINICAL DATA:  Right lower quadrant pain and dark stools.  EXAM: CTA ABDOMEN AND PELVIS WITHOUT AND WITH CONTRAST  TECHNIQUE: Multidetector CT imaging of the abdomen and pelvis was performed using the standard protocol during bolus administration of intravenous contrast. Multiplanar reconstructed images and MIPs were obtained and reviewed to evaluate the vascular anatomy.  CONTRAST:  19mL OMNIPAQUE IOHEXOL 350 MG/ML SOLN  COMPARISON:  CT thorax 01/13/2014  FINDINGS: Lower chest: Linear bandlike Atelectasis in the inferior lingula and left lower lobe is similar prior.  Hepatobiliary: No focal hepatic lesion. Postcholecystectomy. No biliary dilatation. Common bile duct is normal caliber through the pancreatic head.  Pancreas: Is atrophy of the pancreas.  Clear ductal dilatation.  Spleen: Normal spleen  Adrenals/urinary tract: Adrenal glands and kidneys are normal. The ureters and bladder normal.  Stomach/Bowel: Stomach, small-bowel, and cecum are normal. The colon is collapsed from the transverse to the descending colon. No obstructing lesion identified. Diverticula of sigmoid colon without acute inflammation.  Vascular/Lymphatic: Motion degradation of the upper abdomen. CTA examination aorta demonstrates minimal atherosclerotic calcification. The SMA and celiac trunk are widely patent. There is narrowing of the proximal celiac trunk with post stenotic dilatation seen best on sagittal image 86, series 11. The branches of study trunk are patent. The branches of the celiac trunk are patent. SMA is patent distally. The IMA is patent.  There bilateral single renal arteries. There is calcification the ostia of the renal arteries. Iliac arteries are normal. No  aneurysm. Abdominal aorta is normal caliber. There is no retroperitoneal or periportal lymphadenopathy. No pelvic lymphadenopathy.  Reproductive: Post hysterectomy  Musculoskeletal: No aggressive osseous lesion. Severe degenerate change in the lumbar spine with endplate and facet hypertrophy.  Other: There is haziness to the upper abdominal mesentery beneath the pancreas. There is motion degradation through this region (image 66, series 4.  Review of the MIP images confirms the above findings.  IMPRESSION: 1. Motion degradation through the upper abdomen. The branches of the SMA and celiac trunk are widely patent. There is poststenotic dilatation of the celiac trunk. 2. Haziness of the mesentery inferior to the pancreas. This is not specific and has more of a pattern of pancreatitis than mesenteric ischemia. 3. Diverticulosis of the sigmoid colon without evidence of  acute diverticulitis.   Electronically Signed   By: Suzy Bouchard M.D.   On: 10/25/2014 09:42    Microbiology: Recent Results (from the past 240 hour(s))  Urine culture     Status: None   Collection Time: 10/25/14  9:40 AM  Result Value Ref Range Status   Specimen Description URINE, CATHETERIZED  Final   Special Requests NONE  Final   Culture   Final    >=100,000 COLONIES/mL ESCHERICHIA COLI Performed at [facility]    Report Status 10/28/2014 FINAL  Final   Organism ID, Bacteria ESCHERICHIA COLI  Final      Susceptibility   Escherichia coli - MIC*    AMPICILLIN <=2 SENSITIVE Sensitive     CEFAZOLIN <=4 SENSITIVE Sensitive     CEFTRIAXONE <=1 SENSITIVE Sensitive     CIPROFLOXACIN <=0.25 SENSITIVE Sensitive     GENTAMICIN <=1 SENSITIVE Sensitive     IMIPENEM <=0.25 SENSITIVE Sensitive     NITROFURANTOIN <=16 SENSITIVE Sensitive     TRIMETH/SULFA <=20 SENSITIVE Sensitive     AMPICILLIN/SULBACTAM <=2 SENSITIVE Sensitive     PIP/TAZO <=4 SENSITIVE Sensitive     * >=100,000 COLONIES/mL ESCHERICHIA COLI  Culture, blood (x  2)     Status: None (Preliminary result)   Collection Time: 10/25/14 11:35 AM  Result Value Ref Range Status   Specimen Description BLOOD LEFT ARM  Final   Special Requests BOTTLES DRAWN AEROBIC AND ANAEROBIC 5CC  Final   Culture   Final    NO GROWTH 4 DAYS Performed at [facility]    Report Status PENDING  Incomplete  Culture, blood (x 2)     Status: None (Preliminary result)   Collection Time: 10/25/14 11:45 AM  Result Value Ref Range Status   Specimen Description BLOOD RIGHT ARM  Final   Special Requests BOTTLES DRAWN AEROBIC ONLY [place]  Final   Culture   Final    NO GROWTH 4 DAYS Performed at [facility]    Report Status PENDING  Incomplete  MRSA PCR Screening     Status: None   Collection Time: 10/25/14 12:31 PM  Result Value Ref Range Status   MRSA by PCR NEGATIVE NEGATIVE Final    Comment:        The GeneXpert MRSA Assay (FDA approved for NASAL specimens only), is one component of a comprehensive MRSA colonization surveillance program. It is not intended to diagnose MRSA infection nor to guide or monitor treatment for MRSA infections.   Urine culture     Status: None   Collection Time: 10/25/14  3:12 PM  Result Value Ref Range Status   Specimen Description URINE, CLEAN CATCH  Final   Special Requests NONE  Final   Culture   Final    NO GROWTH 2 DAYS Performed at [facility]    Report Status 10/28/2014 FINAL  Final     Labs: Basic Metabolic Panel:  Recent Labs Lab 10/25/14 0714 10/26/14 0522 10/29/14 1920  NA 134* 140 139  K 4.6 3.7 3.9  CL 105 110 108  CO2 23 24 26   GLUCOSE 164* 100* 167*  BUN 28* 13 <5*  CREATININE 0.67 0.66 0.58  CALCIUM 9.5 9.0 9.1   Liver Function Tests:  Recent Labs Lab 10/25/14 0714  AST 23  ALT 22  ALKPHOS 55  BILITOT 0.9  PROT 6.4*  ALBUMIN 3.7    Recent Labs Lab 10/25/14 0731  LIPASE 10*   No results for input(s): AMMONIA  in the last 168 hours. CBC:  Recent Labs Lab  10/25/14 0714  10/26/14 0522  10/27/14 1711 10/28/14 1059 10/29/14 0413 10/29/14 1920 10/30/14 0808  WBC 20.2*  --  9.2  --   --  9.9  --  8.0 6.8  NEUTROABS 19.1*  --   --   --   --   --   --   --   --   HGB 11.0*  < > 8.8*  < > 9.2* 9.7* 8.4* 9.5* 9.5*  HCT 32.7*  < > 26.1*  < > 27.4* 29.2* 25.1* 28.6* 28.7*  MCV 92.1  --  95.3  --   --  96.1  --  96.9 94.7  PLT 333  --  285  --   --  378  --  419* 393  < > = values in this interval not displayed. Cardiac Enzymes: No results for input(s): CKTOTAL, CKMB, CKMBINDEX, TROPONINI in the last 168 hours. BNP: BNP (last 3 results) No results for input(s): BNP in the last 8760 hours.  ProBNP (last 3 results) No results for input(s): PROBNP in the last 8760 hours.  CBG: No results for input(s): GLUCAP in the last 168 hours.     SignedLouellen Molder  [facility] 10/30/2014, 10:28 AM

## 2014-10-30 NOTE — Clinical Social Work Placement (Signed)
   CLINICAL SOCIAL WORK PLACEMENT  NOTE  Date:  10/30/2014  Patient Details  Name: KORIE STREAT MRN: [id number] Date of Birth: November 04, 1923  Clinical Social Work is seeking post-discharge placement for this patient at the [place] level of care (*CSW will initial, date and re-position this form in  chart as items are completed):  Yes   Patient/family provided with [place] Work Department's list of facilities offering this level of care within the geographic area requested by the patient (or if unable, by the patient's family).  Yes   Patient/family informed of their freedom to choose among providers that offer the needed level of care, that participate in Medicare, Medicaid or managed care program needed by the patient, have an available bed and are willing to accept the patient.  Yes   Patient/family informed of [facility]'s ownership interest in [facility] and [facility], as well as of the fact that they are under no obligation to receive care at these facilities.  PASRR submitted to EDS on 10/26/14     PASRR number received on       Existing PASRR number confirmed on 10/26/14     FL2 transmitted to all facilities in geographic area requested by pt/family on       FL2 transmitted to all facilities within larger geographic area on 10/26/14     Patient informed that his/her managed care company has contracts with or will negotiate with certain facilities, including the following:        Yes   Patient/family informed of bed offers received.  Patient chooses bed at [facility]     Physician recommends and patient chooses bed at      Patient to be transferred to [facility] on 10/30/14.  Patient to be transferred to facility by ambulance [facility])     Patient family notified on 10/30/14 of transfer.  Name of family member notified:  pt and pt daughter, Izora Gala notified at bedside     PHYSICIAN Please sign DNR, Please  sign FL2     Additional Comment:    _______________________________________________ Ladell Pier, LCSW 10/30/2014, 12:13 PM

## 2014-11-02 ENCOUNTER — Non-Acute Institutional Stay (SKILLED_NURSING_FACILITY): Payer: Medicare Other | Admitting: Adult Health

## 2014-11-02 DIAGNOSIS — I10 Essential (primary) hypertension: Secondary | ICD-10-CM

## 2014-11-02 DIAGNOSIS — M5137 Other intervertebral disc degeneration, lumbosacral region: Secondary | ICD-10-CM

## 2014-11-02 DIAGNOSIS — K922 Gastrointestinal hemorrhage, unspecified: Secondary | ICD-10-CM

## 2014-11-02 DIAGNOSIS — N390 Urinary tract infection, site not specified: Secondary | ICD-10-CM | POA: Diagnosis not present

## 2014-11-02 DIAGNOSIS — N3281 Overactive bladder: Secondary | ICD-10-CM

## 2014-11-02 DIAGNOSIS — B9620 Unspecified Escherichia coli [E. coli] as the cause of diseases classified elsewhere: Secondary | ICD-10-CM

## 2014-11-02 DIAGNOSIS — A4151 Sepsis due to Escherichia coli [E. coli]: Secondary | ICD-10-CM

## 2014-11-02 DIAGNOSIS — J4520 Mild intermittent asthma, uncomplicated: Secondary | ICD-10-CM

## 2014-11-02 DIAGNOSIS — R197 Diarrhea, unspecified: Secondary | ICD-10-CM | POA: Diagnosis not present

## 2014-11-05 ENCOUNTER — Encounter: Payer: Self-pay | Admitting: Adult Health

## 2014-11-05 NOTE — Progress Notes (Signed)
Patient ID: Brandy Crawford, female   DOB: 1923-10-16, 81 y.o.   MRN: [id number]   Facility[facility] [place]       Allergies  Allergen Reactions  . Tramadol Other (See Comments)    Hallucinations, agitation/combativeness  . Amoxicillin     Per MAR  . Oxycodone Other (See Comments)    Per MAR   . Sulfamethoxazole     Per MAR   . Penicillins Rash    Chief Complaint  Patient presents with  . Hospitalization Follow-up    HPI:  She is a resident of assisted who has been hospitalized for lower GI bleed; and sepsis due to e-coli uti. She is here for short term rehab with her goal to return to assisted living once she has completed a coarse of short term rehab.  Her lower GI bleed was felt to be diverticular and was treated conservatively; she will need long term PPI and will be able to restart asa in 8 weeks.    Past Medical History  Diagnosis Date  . Asthma   . GERD (gastroesophageal reflux disease)   . Hypertension   . Osteoporosis   . TIA (transient ischemic attack) 09/20/2006  . Hyperlipemia   . Cervical disc disease   . Anemia   . Depression   . Lumbosacral spondylosis 10/28/2009  . Scoliosis     Past Surgical History  Procedure Laterality Date  . Appendectomy    . Abdominal hysterectomy    . Tonsillectomy    . Cholecystectomy      VITAL SIGNS BP 152/85 mmHg  Pulse 80  Ht 5\' 6"  (1.676 m)  Wt 148 lb (67.132 kg)  BMI 23.90 kg/m2  Patient's Medications  New Prescriptions   No medications on file  Previous Medications   ACETAMINOPHEN (TYLENOL) 325 MG TABLET    Take 650 mg by mouth 3 (three) times daily. Also take 650mg  for fever >101 x1 dose. And notify MD   ALBUTEROL (PROVENTIL HFA;VENTOLIN HFA) 108 (90 BASE) MCG/ACT INHALER    Inhale 2 puffs into the lungs every 6 (six) hours as needed for wheezing or shortness of breath.   AMLODIPINE (NORVASC) 5 MG TABLET    Take 5 mg by mouth daily with breakfast.    ASCORBIC ACID (VITAMIN C) 500 MG TABLET    Take  500 mg by mouth daily with breakfast.    CHOLESTYRAMINE (QUESTRAN) 4 G PACKET    Take 4 g by mouth 2 (two) times daily.   LOPERAMIDE (IMODIUM) 2 MG CAPSULE    Take 2-4 mg by mouth as needed for diarrhea or loose stools. For the initial dose take mg, and then take 2mg  after each loose stool for 48 hours max.   LOSARTAN (COZAAR) 100 MG TABLET    Take 100 mg by mouth daily with breakfast.    MIRABEGRON ER (MYRBETRIQ) 25 MG TB24    Take 25 mg by mouth daily with breakfast. Prescribed by urology   PANTOPRAZOLE (PROTONIX) 40 MG TABLET    Take 1 tablet (40 mg total) by mouth daily.   PYRIDOXINE (VITAMIN B-6) 100 MG TABLET    Take 100 mg by mouth daily with breakfast.    TRAMADOL (ULTRAM) 50 MG TABLET    Take 1 tablet (50 mg total) by mouth every 12 (twelve) hours as needed for moderate pain.   VITAMIN E (VITAMIN E) 400 UNIT CAPSULE    Take 400 Units by mouth daily with breakfast.   Modified Medications  No medications on file  Discontinued Medications   No medications on file     SIGNIFICANT DIAGNOSTIC EXAMS  10-25-14: chest x-ray: Aortic atherosclerotic change. No acute abnormality is seen. Small hiatal hernia.  10-25-14: ct of head: No acute intracranial abnormality. Atrophy with chronic small vessel ischemic disease, slightly progressed since 2007  10-25-14: cta of abdomen and pelvis: 1. Motion degradation through the upper abdomen. The branches of the SMA and celiac trunk are widely patent. There is poststenotic dilatation of the celiac trunk. 2. Haziness of the mesentery inferior to the pancreas. This is not specific and has more of a pattern of pancreatitis than mesenteric ischemia. 3. Diverticulosis of the sigmoid colon without evidence of acute diverticulitis.   LABS REVIEWED:   10-25-14: wbc 20.2; hgb 11.0; hct 32.7; mcv 92.1; plt 333; glucose 164; bun 28; creat 0.67; k+4.6; na++134; liver normal albumin 3.7; urine culture: e-coli 10-29-14: wbc 8.0; hgb 9.5; hct 28.6; mcv 96.9; plt 419;  glucose 167; bun <5; creat 0.58; k+3.9; na++139; tsh 2.170; vit b12: 387 10-13-14: hgb 9.3; hct 28.4     Review of Systems  Constitutional: Negative for appetite change and fatigue.  HENT: Negative for congestion.   Respiratory: Negative for cough, chest tightness and shortness of breath.   Cardiovascular: Negative for chest pain, palpitations and leg swelling.  Gastrointestinal: Negative for nausea, abdominal pain, diarrhea and constipation.  Musculoskeletal: Negative for myalgias and arthralgias.  Skin: Negative for pallor.  Neurological: Negative for dizziness.  Psychiatric/Behavioral: The patient is not nervous/anxious.       Physical Exam  Constitutional: She is oriented to person, place, and time. No distress.  Eyes: Conjunctivae are normal.  Neck: Neck supple. No JVD present. No thyromegaly present.  Cardiovascular: Normal rate, regular rhythm and intact distal pulses.   Respiratory: Effort normal and breath sounds normal. No respiratory distress. She has no wheezes.  GI: Soft. Bowel sounds are normal. She exhibits no distension. There is no tenderness.  Musculoskeletal: She exhibits no edema.  Able to move all extremities   Lymphadenopathy:    She has no cervical adenopathy.  Neurological: She is alert and oriented to person, place, and time.  Skin: Skin is warm and dry. She is not diaphoretic.  Has bruise on left forehead   Psychiatric: She has a normal mood and affect.     ASSESSMENT/ PLAN:  1. UTI/Sepsis: has completed her cipro therapy. Will continue to monitor her status   2. Lower GI bleed: no further bleed present; her hgb is stable at 9.3. Is taking protonix 40 mg daily she will need long term PPI therapy  will monitor   3. Diarrhea: no reports of diarrheal stools present; will continue questran 4 gm twice daily and will monitor   4. Hypertension: will continue cozaar 100 mg daily and norvasc 5 mg daily and will monitor  5. Overactive bladder: will  continue nyrbetriq 25 mg daily will monitor  6. Asthma: will continue albuterol 2 puffs every 6 hours as needed  7. Anemia: her hgb is stable at 9.3; will monitor is presently not on supplement  8. TIA: will hold her asa for 8 weeks will then restart.   9. DDD lumbosacral region: she is presently stable will continue tylenol 650 mg three times daily; and ultram 50 mg twice daily as needed and will monitor   Time spent with patient  50  minutes >50% time spent counseling; reviewing medical record; tests; labs; and developing future plan of care  Ok Edwards NP [facility]  Contact [phone] Monday through Friday 8am- 5pm  After hours call [phone]

## 2014-11-09 ENCOUNTER — Non-Acute Institutional Stay (SKILLED_NURSING_FACILITY): Payer: Medicare Other | Admitting: Internal Medicine

## 2014-11-09 ENCOUNTER — Encounter: Payer: Self-pay | Admitting: Internal Medicine

## 2014-11-09 DIAGNOSIS — I10 Essential (primary) hypertension: Secondary | ICD-10-CM | POA: Diagnosis not present

## 2014-11-09 DIAGNOSIS — B9620 Unspecified Escherichia coli [E. coli] as the cause of diseases classified elsewhere: Secondary | ICD-10-CM

## 2014-11-09 DIAGNOSIS — M5137 Other intervertebral disc degeneration, lumbosacral region: Secondary | ICD-10-CM

## 2014-11-09 DIAGNOSIS — N3281 Overactive bladder: Secondary | ICD-10-CM | POA: Diagnosis not present

## 2014-11-09 DIAGNOSIS — R197 Diarrhea, unspecified: Secondary | ICD-10-CM

## 2014-11-09 DIAGNOSIS — D62 Acute posthemorrhagic anemia: Secondary | ICD-10-CM

## 2014-11-09 DIAGNOSIS — Z8679 Personal history of other diseases of the circulatory system: Secondary | ICD-10-CM | POA: Diagnosis not present

## 2014-11-09 DIAGNOSIS — K922 Gastrointestinal hemorrhage, unspecified: Secondary | ICD-10-CM | POA: Diagnosis not present

## 2014-11-09 DIAGNOSIS — R269 Unspecified abnormalities of gait and mobility: Secondary | ICD-10-CM

## 2014-11-09 DIAGNOSIS — K219 Gastro-esophageal reflux disease without esophagitis: Secondary | ICD-10-CM

## 2014-11-09 DIAGNOSIS — N390 Urinary tract infection, site not specified: Secondary | ICD-10-CM | POA: Diagnosis not present

## 2014-11-09 DIAGNOSIS — E785 Hyperlipidemia, unspecified: Secondary | ICD-10-CM

## 2014-11-09 NOTE — Progress Notes (Signed)
Patient ID: Brandy Crawford, female   DOB: 1923/05/26, 81 y.o.   MRN: [id number]    HISTORY AND PHYSICAL  Location:  [facility] Room Number: [place] of Service: SNF [phone])   Extended Emergency Contact Information Primary Emergency Contact: [name] Address: [place], [place] [address] [name] of [place] Phone: [phone] Work Phone: [phone] Mobile Phone: [phone] Relation: Daughter Secondary Emergency Contact: [name] Address: [address] [place] [address] [name] of [place] Phone: [phone] Mobile Phone: [phone] Relation: Son  Advanced Directive information Does patient have an advance directive?: Yes, Type of Advance Directive: Out of facility DNR (pink MOST or yellow form);[place];Living will, Pre-existing out of facility DNR order (yellow form or pink MOST form): Yellow form placed in chart (order not valid for inpatient use);Pink MOST form placed in chart (order not valid for inpatient use), Does patient want to make changes to advanced directive?: No - Patient declined  Chief Complaint  Patient presents with  . New Admit To SNF    HPI:   Admission to skilled nursing facility following hospitalization 10/25/2014 through 10/30/2014.  Patient was admitted to the hospital with a urinary tract infection secondary to Escherichia coli. She was not septic during this hospital stay.  Patient had melanotic stools and suffered from an acute blood loss anemia related to a lower gastrointestinal bleed. Last hemoglobin was 9.3 g percent.  Patient also had some diarrhea which improved when she was placed on Questran and Imodium.  Patient has a chronic gait instability and generally uses a four-wheel walker with brakes and a seat. She is getting physical therapy at the present time for strengthening and gait training. She is currently using  a 4-point walker with front wheels and rear skids.  She was acutely encephalopathic and delirious with hallucinations, agitation, and combativeness. This cleared as her antibiotics corrected the septic episode.  She has a chronic overactive bladder for which she is treated with Myrbetriq.  Remote history of GERD, but she is asymptomatic at this time. Diverticulosis was noted on x-rays and was the presumed source of blood loss. She was put on pantoprazole because of the history of GERD. Aspirin is to be held until September 2016.  There is a history of asthma and occasional dyspnea for which he receives albuterol nebulized inhalations when necessary.  When delirious she had x-rays of the brain which showed small vessel disease and cerebral atrophy.  Incidental finding of aortic atherosclerosis was noted on x-rays.  She has chronic low back discomfort related to degenerative disc disease of the lumbar spine. She also has scoliosis.   There is a past history of depression, but she is not currently treated with any medications and seems normal emotionally at this time.  Hypertension is treated with amlodipine and losartan  Past Medical History  Diagnosis Date  . Asthma   . GERD (gastroesophageal reflux disease)   . Hypertension   . TIA (transient ischemic attack) 09/20/2006  . Cervical disc disease   . Anemia   . Depression   . Lumbosacral spondylosis 10/28/2009  . Abnormality of gait 04/28/2010    History of fall onto right shoulder-has participated in PT. Uses walker.     . Acute lower GI bleeding 10/30/2014  . Fall at nursing home 10/25/2014  . History of cardiovascular disorder  09/20/2006    X 3 in 2000 now on aspirin.    Marland Kitchen Hyperlipidemia 09/20/2006    05/15/14 LDL 95   . OSTEOPOROSIS 09/20/2006    Noted 2008    . Overactive bladder 11/29/2013    Myrbetriq prescribed by urology.    . SCOLIOSIS 10/28/2009  . Diarrhea 10/25/14  . Acute encephalopathy 10/25/14    Occurred when septic  .  Diverticulosis 10/27/2014  . Cerebrovascular disease 10/27/2014    Small vessel disease  . Cerebral atrophy 10/27/2014  . Aortic atherosclerosis 10/27/2014  . Degenerative disc disease, lumbar 10/27/2014  . Scoliosis 10/27/2014    Past Surgical History  Procedure Laterality Date  . Appendectomy    . Abdominal hysterectomy    . Tonsillectomy    . Cholecystectomy      Patient Care Team: Estill Dooms, MD as PCP - General (Internal Medicine) Man [name] X, NP as Nurse Practitioner (Nurse Practitioner)  History   Social History  . Marital Status: Widowed    Spouse Name: N/A  . Number of Children: N/A  . Years of Education: N/A   Occupational History  . retired Pharmacist, hospital    Social History Main Topics  . Smoking status: Never Smoker   . Smokeless tobacco: Never Used  . Alcohol Use: No  . Drug Use: No  . Sexual Activity: No   Other Topics Concern  . Not on file   Social History Narrative   Widowed from 2nd husband in 2000.    Lives alone at [facility], moved to [place] 01/15/14,  Does ADLs and IADLs.    Hobbies: crossword puzzles, used to knit   Never smoked   Alcohol none   Caffeine none   Exercise walking   Walks with walker        reports that she has never smoked. She has never used smokeless tobacco. She reports that she does not drink alcohol or use illicit drugs.  Family History  Problem Relation Age of Onset  . Pancreatic cancer Father 81    deceased  . Heart failure Mother 104    deceased  . Leukemia Brother 58    deceased  . Diabetes type II    . Alcohol abuse     Family Status  Relation Status Death Age  . Father Deceased   . Mother Deceased   . Brother Deceased 26's  . Daughter Alive   . Son Alive     Immunization History  Administered Date(s) Administered  . HiB (PRP-OMP) 02/12/2004  . Influenza Split 01/11/2012  . Influenza Whole 01/07/2009  . Influenza-Unspecified 01/11/2014  . Pneumococcal Polysaccharide-23 04/28/2010    . Tdap 08/24/2011  . Zoster 04/13/2010    Allergies  Allergen Reactions  . Tramadol Other (See Comments)    Hallucinations, agitation/combativeness  . Amoxicillin     Per MAR  . Oxycodone Other (See Comments)    Per MAR   . Sulfamethoxazole     Per MAR   . Penicillins Rash    Medications: Patient's Medications  New Prescriptions   No medications on file  Previous Medications   ACETAMINOPHEN (TYLENOL) 325 MG TABLET    Take 650 mg by mouth 3 (three) times daily. Also take 684m for fever >101 x1 dose. And notify MD   ALBUTEROL (PROVENTIL HFA;VENTOLIN HFA) 108 (90 BASE) MCG/ACT INHALER    Inhale 2 puffs into the lungs every 6 (six) hours as needed for wheezing or shortness of breath.   AMLODIPINE (  NORVASC) 5 MG TABLET    Take 5 mg by mouth daily with breakfast.    ASCORBIC ACID (VITAMIN C) 500 MG TABLET    Take 500 mg by mouth daily with breakfast.    CHOLESTYRAMINE (QUESTRAN) 4 G PACKET    Take 4 g by mouth 2 (two) times daily.   LOPERAMIDE (IMODIUM) 2 MG CAPSULE    Take 2-4 mg by mouth as needed for diarrhea or loose stools. For the initial dose take mg, and then take 71m after each loose stool for 48 hours max.   LOSARTAN (COZAAR) 100 MG TABLET    Take 100 mg by mouth daily with breakfast.    MIRABEGRON ER (MYRBETRIQ) 25 MG TB24    Take 25 mg by mouth daily with breakfast. Prescribed by urology   PANTOPRAZOLE (PROTONIX) 40 MG TABLET    Take 1 tablet (40 mg total) by mouth daily.   PYRIDOXINE (VITAMIN B-6) 100 MG TABLET    Take 100 mg by mouth daily with breakfast.    TRAMADOL (ULTRAM) 50 MG TABLET    Take 1 tablet (50 mg total) by mouth every 12 (twelve) hours as needed for moderate pain.   VITAMIN E (VITAMIN E) 400 UNIT CAPSULE    Take 400 Units by mouth daily with breakfast.   Modified Medications   No medications on file  Discontinued Medications   No medications on file    Review of Systems  Constitutional: Negative for fever, chills and diaphoresis.  HENT: Positive  for hearing loss. Negative for congestion, ear discharge, ear pain, nosebleeds, sore throat and tinnitus.   Eyes: Negative for photophobia, pain, discharge and redness.  Respiratory: Negative for cough, shortness of breath, wheezing and stridor.   Cardiovascular: Positive for leg swelling. Negative for chest pain and palpitations.       Trace  Gastrointestinal: Positive for diarrhea (Under control with Questran and Imodium). Negative for nausea, vomiting, abdominal pain, constipation and blood in stool.  Endocrine: Negative.  Negative for polydipsia.  Genitourinary: Positive for frequency. Negative for dysuria, urgency, hematuria and flank pain.       1-2x/night. Stress incontinence  Musculoskeletal: Positive for back pain. Negative for myalgias and neck pain.  Skin: Negative.  Negative for rash.  Allergic/Immunologic: Negative.  Negative for environmental allergies.  Neurological: Negative for dizziness, tremors, seizures, weakness and headaches.       Memory loss. Patient had delirium when septic.  Hematological: Does not bruise/bleed easily.       Anemic secondary to acute blood loss from lower GI bleeding  Psychiatric/Behavioral: Positive for confusion and agitation. Negative for suicidal ideas, hallucinations and behavioral problems. The patient is not nervous/anxious.     Filed Vitals:   11/09/14 1239  BP: 150/66  Pulse: 72  Temp: 97.9 F (36.6 C)  Resp: 18  Height: 5' 5.5" (1.664 m)  Weight: 139 lb (63.05 kg)  SpO2: 97%   Body mass index is 22.77 kg/(m^2).  Physical Exam  Constitutional: She is oriented to person, place, and time. She appears well-developed and well-nourished. No distress.  HENT:  Head: Normocephalic and atraumatic.  Right Ear: No lacerations. No drainage. No foreign bodies. No mastoid tenderness. Tympanic membrane is not injected, not scarred, not perforated, not erythematous, not retracted and not bulging. Tympanic membrane mobility is abnormal. No middle  ear effusion. Decreased hearing is noted.  Left Ear: No lacerations. No drainage. No foreign bodies. No mastoid tenderness. Tympanic membrane is not injected, not scarred, not perforated, not  erythematous, not retracted and not bulging. Tympanic membrane mobility is abnormal.  No middle ear effusion. Decreased hearing is noted.  Nose: Nose normal.  Mouth/Throat: Oropharynx is clear and moist. No oropharyngeal exudate.  Eyes: Conjunctivae and EOM are normal. Pupils are equal, round, and reactive to light. Right eye exhibits no discharge. Left eye exhibits no discharge. No scleral icterus.  Neck: Normal range of motion. Neck supple. No JVD present. No tracheal deviation present. No thyromegaly present.  Cardiovascular: Normal rate, regular rhythm, normal heart sounds and intact distal pulses.  Exam reveals no gallop and no friction rub.   No murmur heard. Pulmonary/Chest: Effort normal and breath sounds normal. No stridor. No respiratory distress. She has no wheezes. She has no rales. She exhibits no tenderness.  Abdominal: Soft. Bowel sounds are normal. She exhibits no distension and no mass. There is no tenderness. There is no rebound and no guarding.  Genitourinary: Guaiac negative stool.  Musculoskeletal: She exhibits edema. She exhibits no tenderness.  Trace in ankle.   Lymphadenopathy:    She has no cervical adenopathy.  Neurological: She is alert and oriented to person, place, and time. She has normal reflexes. No cranial nerve deficit. She exhibits normal muscle tone. Coordination normal.  Skin: Skin is warm and dry. No rash noted. She is not diaphoretic. No erythema. No pallor.  Lots of moles.   Psychiatric: She has a normal mood and affect. Her behavior is normal. Judgment and thought content normal.     Labs reviewed: Admission on 10/25/2014, Discharged on 10/30/2014  Component Date Value Ref Range Status  . Sodium 10/25/2014 134* 135 - 145 mmol/L Final  . Potassium 10/25/2014 4.6   3.5 - 5.1 mmol/L Final  . Chloride 10/25/2014 105  101 - 111 mmol/L Final  . CO2 10/25/2014 23  22 - 32 mmol/L Final  . Glucose, Bld 10/25/2014 164* 65 - 99 mg/dL Final  . BUN 10/25/2014 28* 6 - 20 mg/dL Final  . Creatinine, Ser 10/25/2014 0.67  0.44 - 1.00 mg/dL Final  . Calcium 10/25/2014 9.5  8.9 - 10.3 mg/dL Final  . Total Protein 10/25/2014 6.4* 6.5 - 8.1 g/dL Final  . Albumin 10/25/2014 3.7  3.5 - 5.0 g/dL Final  . AST 10/25/2014 23  15 - 41 U/L Final  . ALT 10/25/2014 22  14 - 54 U/L Final  . Alkaline Phosphatase 10/25/2014 55  38 - 126 U/L Final  . Total Bilirubin 10/25/2014 0.9  0.3 - 1.2 mg/dL Final  . GFR calc non Af Amer 10/25/2014 >60  >60 mL/min Final  . GFR calc Af Amer 10/25/2014 >60  >60 mL/min Final   Comment: (NOTE) The eGFR has been calculated using the CKD EPI equation. This calculation has not been validated in all clinical situations. eGFR's persistently <60 mL/min signify possible Chronic Kidney Disease.   . Anion gap 10/25/2014 6  5 - 15 Final  . WBC 10/25/2014 20.2* 4.0 - 10.5 K/uL Final  . RBC 10/25/2014 3.55* 3.87 - 5.11 MIL/uL Final  . Hemoglobin 10/25/2014 11.0* 12.0 - 15.0 g/dL Final  . HCT 10/25/2014 32.7* 36.0 - 46.0 % Final  . MCV 10/25/2014 92.1  78.0 - 100.0 fL Final  . MCH 10/25/2014 31.0  26.0 - 34.0 pg Final  . MCHC 10/25/2014 33.6  30.0 - 36.0 g/dL Final  . RDW 10/25/2014 12.8  11.5 - 15.5 % Final  . Platelets 10/25/2014 333  150 - 400 K/uL Final  . ABO/RH(D) 10/25/2014 O POS  Final  . Antibody Screen 10/25/2014 NEG   Final  . Sample Expiration 10/25/2014 10/28/2014   Final  . Fecal Occult Bld 10/25/2014 POSITIVE* NEGATIVE Final  . Prothrombin Time 10/25/2014 14.2  11.6 - 15.2 seconds Final  . INR 10/25/2014 1.08  0.00 - 1.49 Final  . Lactic Acid, Venous 10/25/2014 2.63* 0.5 - 2.0 mmol/L Final  . Comment 10/25/2014 NOTIFIED PHYSICIAN   Final  . Color, Urine 10/25/2014 YELLOW  YELLOW Final  . APPearance 10/25/2014 CLOUDY* CLEAR Final  .  Specific Gravity, Urine 10/25/2014 1.033* 1.005 - 1.030 Final  . pH 10/25/2014 5.5  5.0 - 8.0 Final  . Glucose, UA 10/25/2014 NEGATIVE  NEGATIVE mg/dL Final  . Hgb urine dipstick 10/25/2014 NEGATIVE  NEGATIVE Final  . Bilirubin Urine 10/25/2014 NEGATIVE  NEGATIVE Final  . Ketones, ur 10/25/2014 NEGATIVE  NEGATIVE mg/dL Final  . Protein, ur 10/25/2014 NEGATIVE  NEGATIVE mg/dL Final  . Urobilinogen, UA 10/25/2014 0.2  0.0 - 1.0 mg/dL Final  . Nitrite 10/25/2014 POSITIVE* NEGATIVE Final  . Leukocytes, UA 10/25/2014 SMALL* NEGATIVE Final  . Lipase 10/25/2014 10* 22 - 51 U/L Final  . ABO/RH(D) 10/25/2014 O POS   Final  . Neutrophils Relative % 10/25/2014 93* 43 - 77 % Final  . Neutro Abs 10/25/2014 19.1* 1.7 - 7.7 K/uL Final  . Lymphocytes Relative 10/25/2014 2* 12 - 46 % Final  . Lymphs Abs 10/25/2014 0.5* 0.7 - 4.0 K/uL Final  . Monocytes Relative 10/25/2014 5  3 - 12 % Final  . Monocytes Absolute 10/25/2014 1.0  0.1 - 1.0 K/uL Final  . Eosinophils Relative 10/25/2014 0  0 - 5 % Final  . Eosinophils Absolute 10/25/2014 0.0  0.0 - 0.7 K/uL Final  . Basophils Relative 10/25/2014 0  0 - 1 % Final  . Basophils Absolute 10/25/2014 0.0  0.0 - 0.1 K/uL Final  . WBC, UA 10/25/2014 11-20  <3 WBC/hpf Final  . Bacteria, UA 10/25/2014 MANY* RARE Final  . Casts 10/25/2014 HYALINE CASTS* NEGATIVE Final  . Urine-Other 10/25/2014 MUCOUS PRESENT   Final  . Specimen Description 10/25/2014 URINE, CATHETERIZED   Final  . Special Requests 10/25/2014 NONE   Final  . Culture 10/25/2014    Final                   Value:>=100,000 COLONIES/mL ESCHERICHIA COLI Performed at [facility] [place]   . Report Status 10/25/2014 10/28/2014 FINAL   Final  . Organism ID, Bacteria 10/25/2014 ESCHERICHIA COLI   Final  . Campylobacter by PCR 10/26/2014 Not Detected   Final  . C difficile toxin A/B 10/26/2014 Comment   Final   POSITIVE  . E coli 0157 by PCR 10/26/2014 Not Detected   Final  . E coli (ETEC) LT/ST 10/26/2014  Not Detected   Final  . E coli (STEC) 10/26/2014 Not Detected   Final  . Salmonella by PCR 10/26/2014 Not Detected   Final  . Shigella by PCR 10/26/2014 Not Detected   Final   Comment: (NOTE) Reference value for all analytes: Not Detected The results of this test should not be used as the sole basis for diagnosis, treatment, or other patient management decisions. xTAG(R) GPP positive results are presumptive and must be confirmed by FDA-cleared tests or other acceptable reference methods. Confirmed positive results do not rule out co-infection with other organisms that are not detected by this test, and may not be the sole or definitive cause of patient illness. Negative xTAG(R) Gastrointestinal Pathogen  Panel results in the setting of clinical illness compatible with gastroenteritis may be due to infection by pathogens that are not detected by this test or non-infectious causes such as ulcerative colitis, irritable bowel syndrome, or Crohn's disease. xTAG GPP is not intended to monitor or guide treatment for C. difficile infections. Performed At: [facility] [place], [place] [id number] Ileana Ladd PhD [id number]   . Norovirus G!/G2 10/26/2014 Not Detected   Final  . Rotavirus A by PCR 10/26/2014 Not Detected   Final  . G lamblia by PCR 10/26/2014 Not Detected   Final  . Cryptosporidium by PCR 10/26/2014 Not Detected   Final  . Specimen Description 10/25/2014 BLOOD RIGHT ARM   Final  . Special Requests 10/25/2014 BOTTLES DRAWN AEROBIC ONLY [place]   Final  . Culture 10/25/2014    Final                   Value:NO GROWTH 5 DAYS Performed at [facility]   . Report Status 10/25/2014 10/30/2014 FINAL   Final  . Specimen Description 10/25/2014 BLOOD LEFT ARM   Final  . Special Requests 10/25/2014 BOTTLES DRAWN AEROBIC AND ANAEROBIC 5CC   Final  . Culture 10/25/2014    Final                    Value:NO GROWTH 5 DAYS Performed at [facility]   . Report Status 10/25/2014 10/30/2014 FINAL   Final  . Lactic Acid, Venous 10/25/2014 2.1* 0.5 - 2.0 mmol/L Final   Comment: REPEATED TO VERIFY CRITICAL RESULT CALLED TO, READ BACK BY AND VERIFIED WITH: DUNK,K. RN AT 0321 10/25/14 MULLINS,T   . Procalcitonin 10/25/2014 <0.10   Final   Comment:        Interpretation: PCT (Procalcitonin) <= 0.5 ng/mL: Systemic infection (sepsis) is not likely. Local bacterial infection is possible. (NOTE)         ICU PCT Algorithm               Non ICU PCT Algorithm    ----------------------------     ------------------------------         PCT < 0.25 ng/mL                 PCT < 0.1 ng/mL     Stopping of antibiotics            Stopping of antibiotics       strongly encouraged.               strongly encouraged.    ----------------------------     ------------------------------       PCT level decrease by               PCT < 0.25 ng/mL       >= 80% from peak PCT       OR PCT 0.25 - 0.5 ng/mL          Stopping of antibiotics                                             encouraged.     Stopping of antibiotics  encouraged.    ----------------------------     ------------------------------       PCT level decrease by              PCT >= 0.25 ng/mL       < 80% from peak PCT        AND PCT >= 0.5 ng/mL            Continuin                          g antibiotics                                              encouraged.       Continuing antibiotics            encouraged.    ----------------------------     ------------------------------     PCT level increase compared          PCT > 0.5 ng/mL         with peak PCT AND          PCT >= 0.5 ng/mL             Escalation of antibiotics                                          strongly encouraged.      Escalation of antibiotics        strongly encouraged.   Marland Kitchen Specimen Description 10/25/2014 URINE, CLEAN CATCH   Final  . Special Requests  10/25/2014 NONE   Final  . Culture 10/25/2014    Final                   Value:NO GROWTH 2 DAYS Performed at [facility]   . Report Status 10/25/2014 10/28/2014 FINAL   Final  . Hemoglobin 10/25/2014 10.0* 12.0 - 15.0 g/dL Final  . HCT 10/25/2014 29.1* 36.0 - 46.0 % Final  . MRSA by PCR 10/25/2014 NEGATIVE  NEGATIVE Final   Comment:        The GeneXpert MRSA Assay (FDA approved for NASAL specimens only), is one component of a comprehensive MRSA colonization surveillance program. It is not intended to diagnose MRSA infection nor to guide or monitor treatment for MRSA infections.   . WBC 10/26/2014 9.2  4.0 - 10.5 K/uL Final  . RBC 10/26/2014 2.74* 3.87 - 5.11 MIL/uL Final  . Hemoglobin 10/26/2014 8.8* 12.0 - 15.0 g/dL Final  . HCT 10/26/2014 26.1* 36.0 - 46.0 % Final  . MCV 10/26/2014 95.3  78.0 - 100.0 fL Final  . MCH 10/26/2014 32.1  26.0 - 34.0 pg Final  . MCHC 10/26/2014 33.7  30.0 - 36.0 g/dL Final  . RDW 10/26/2014 13.6  11.5 - 15.5 % Final  . Platelets 10/26/2014 285  150 - 400 K/uL Final  . Sodium 10/26/2014 140  135 - 145 mmol/L Final  . Potassium 10/26/2014 3.7  3.5 - 5.1 mmol/L Final   Comment: DELTA CHECK NOTED REPEATED TO VERIFY   . Chloride 10/26/2014 110  101 - 111 mmol/L Final  . CO2 10/26/2014 24  22 - 32 mmol/L Final  . Glucose, Bld 10/26/2014 100* 65 - 99  mg/dL Final  . BUN 10/26/2014 13  6 - 20 mg/dL Final  . Creatinine, Ser 10/26/2014 0.66  0.44 - 1.00 mg/dL Final  . Calcium 10/26/2014 9.0  8.9 - 10.3 mg/dL Final  . GFR calc non Af Amer 10/26/2014 >60  >60 mL/min Final  . GFR calc Af Amer 10/26/2014 >60  >60 mL/min Final   Comment: (NOTE) The eGFR has been calculated using the CKD EPI equation. This calculation has not been validated in all clinical situations. eGFR's persistently <60 mL/min signify possible Chronic Kidney Disease.   . Anion gap 10/26/2014 6  5 - 15 Final  . Hemoglobin 10/26/2014 8.9* 12.0 - 15.0 g/dL Final  . HCT  10/26/2014 26.2* 36.0 - 46.0 % Final  . Hemoglobin 10/27/2014 8.8* 12.0 - 15.0 g/dL Final  . HCT 10/27/2014 26.4* 36.0 - 46.0 % Final  . Hemoglobin 10/27/2014 9.2* 12.0 - 15.0 g/dL Final  . HCT 10/27/2014 27.4* 36.0 - 46.0 % Final  . WBC 10/28/2014 9.9  4.0 - 10.5 K/uL Final  . RBC 10/28/2014 3.04* 3.87 - 5.11 MIL/uL Final  . Hemoglobin 10/28/2014 9.7* 12.0 - 15.0 g/dL Final  . HCT 10/28/2014 29.2* 36.0 - 46.0 % Final  . MCV 10/28/2014 96.1  78.0 - 100.0 fL Final  . MCH 10/28/2014 31.9  26.0 - 34.0 pg Final  . MCHC 10/28/2014 33.2  30.0 - 36.0 g/dL Final  . RDW 10/28/2014 13.7  11.5 - 15.5 % Final  . Platelets 10/28/2014 378  150 - 400 K/uL Final  . Hemoglobin 10/29/2014 8.4* 12.0 - 15.0 g/dL Final  . HCT 10/29/2014 25.1* 36.0 - 46.0 % Final  . WBC 10/29/2014 8.0  4.0 - 10.5 K/uL Final  . RBC 10/29/2014 2.95* 3.87 - 5.11 MIL/uL Final  . Hemoglobin 10/29/2014 9.5* 12.0 - 15.0 g/dL Final  . HCT 10/29/2014 28.6* 36.0 - 46.0 % Final  . MCV 10/29/2014 96.9  78.0 - 100.0 fL Final  . MCH 10/29/2014 32.2  26.0 - 34.0 pg Final  . MCHC 10/29/2014 33.2  30.0 - 36.0 g/dL Final  . RDW 10/29/2014 14.2  11.5 - 15.5 % Final  . Platelets 10/29/2014 419* 150 - 400 K/uL Final  . Sodium 10/29/2014 139  135 - 145 mmol/L Final  . Potassium 10/29/2014 3.9  3.5 - 5.1 mmol/L Final  . Chloride 10/29/2014 108  101 - 111 mmol/L Final  . CO2 10/29/2014 26  22 - 32 mmol/L Final  . Glucose, Bld 10/29/2014 167* 65 - 99 mg/dL Final  . BUN 10/29/2014 <5* 6 - 20 mg/dL Final  . Creatinine, Ser 10/29/2014 0.58  0.44 - 1.00 mg/dL Final  . Calcium 10/29/2014 9.1  8.9 - 10.3 mg/dL Final  . GFR calc non Af Amer 10/29/2014 >60  >60 mL/min Final  . GFR calc Af Amer 10/29/2014 >60  >60 mL/min Final   Comment: (NOTE) The eGFR has been calculated using the CKD EPI equation. This calculation has not been validated in all clinical situations. eGFR's persistently <60 mL/min signify possible Chronic Kidney Disease.   . Anion  gap 10/29/2014 5  5 - 15 Final  . TSH 10/29/2014 2.170  0.350 - 4.500 uIU/mL Final  . Vitamin B-12 10/29/2014 387  180 - 914 pg/mL Final   Comment: (NOTE) This assay is not validated for testing neonatal or myeloproliferative syndrome specimens for Vitamin B12 levels. Performed at [facility]   . WBC 10/30/2014 6.8  4.0 - 10.5 K/uL Final  . RBC  10/30/2014 3.03* 3.87 - 5.11 MIL/uL Final  . Hemoglobin 10/30/2014 9.5* 12.0 - 15.0 g/dL Final  . HCT 10/30/2014 28.7* 36.0 - 46.0 % Final  . MCV 10/30/2014 94.7  78.0 - 100.0 fL Final  . MCH 10/30/2014 31.4  26.0 - 34.0 pg Final  . MCHC 10/30/2014 33.1  30.0 - 36.0 g/dL Final  . RDW 10/30/2014 14.3  11.5 - 15.5 % Final  . Platelets 10/30/2014 393  150 - 400 K/uL Final  Lab on 10/25/2014  Component Date Value Ref Range Status  . Hemoglobin 10/23/2014 12.5  12.0 - 16.0 g/dL Final  . HCT 10/23/2014 37  36 - 46 % Final  . Platelets 10/23/2014 323  150 - 399 K/L Final  . WBC 10/23/2014 7.8   Final  . Glucose 10/23/2014 88   Final  . BUN 10/23/2014 15  4 - 21 mg/dL Final  . Creatinine 10/23/2014 0.6  0.5 - 1.1 mg/dL Final  . Potassium 10/23/2014 4.3  3.4 - 5.3 mmol/L Final  . Sodium 10/23/2014 141  137 - 147 mmol/L Final  . Alkaline Phosphatase 10/23/2014 58  25 - 125 U/L Final  . ALT 10/23/2014 16  7 - 35 U/L Final  . AST 10/23/2014 15  13 - 35 U/L Final  . Bilirubin, Total 10/23/2014 0.5   Final    Ct Head Wo Contrast  10/25/2014   CLINICAL DATA:  Head trauma with scalp laceration secondary to a fall today in her room.  EXAM: CT HEAD WITHOUT CONTRAST  TECHNIQUE: Contiguous axial images were obtained from the base of the skull through the vertex without intravenous contrast.  COMPARISON:  CT scan dated 04/28/2005 and brain MR dated 07/01/2005  FINDINGS: No mass lesion. No midline shift. No acute hemorrhage or hematoma. No extra-axial fluid collections. No evidence of acute infarction. There are scattered areas of periventricular white  matter lucency consistent with chronic small vessel ischemic disease. There is diffuse cerebral cortical atrophy with secondary ventricular dilatation, slightly progressed since 2007.  No acute osseous abnormality. Marked degenerative changes at the articulation of the odontoid with the anterior arch of C1.  IMPRESSION: No acute intracranial abnormality. Atrophy with chronic small vessel ischemic disease, slightly progressed since 2007.   Electronically Signed   By: Lorriane Shire M.D.   On: 10/25/2014 08:42   Dg Chest Port 1 View  10/25/2014   CLINICAL DATA:  Recent fall from bed with chest pain and weakness  EXAM: PORTABLE CHEST - 1 VIEW  COMPARISON:  01/13/2014  FINDINGS: Cardiac shadow is mildly enlarged in size. A small hiatal hernia is noted. Aortic calcifications are again seen. The lungs are well-aerated without focal infiltrate or sizable effusion.  IMPRESSION: Aortic atherosclerotic change.  No acute abnormality is seen.  Small hiatal hernia.   Electronically Signed   By: Inez Catalina M.D.   On: 10/25/2014 11:57   Ct Angio Abd/pel W/ And/or W/o  10/25/2014   CLINICAL DATA:  Right lower quadrant pain and dark stools.  EXAM: CTA ABDOMEN AND PELVIS WITHOUT AND WITH CONTRAST  TECHNIQUE: Multidetector CT imaging of the abdomen and pelvis was performed using the standard protocol during bolus administration of intravenous contrast. Multiplanar reconstructed images and MIPs were obtained and reviewed to evaluate the vascular anatomy.  CONTRAST:  145m OMNIPAQUE IOHEXOL 350 MG/ML SOLN  COMPARISON:  CT thorax 01/13/2014  FINDINGS: Lower chest: Linear bandlike Atelectasis in the inferior lingula and left lower lobe is similar prior.  Hepatobiliary: No focal hepatic  lesion. Postcholecystectomy. No biliary dilatation. Common bile duct is normal caliber through the pancreatic head.  Pancreas: Is atrophy of the pancreas.  Clear ductal dilatation.  Spleen: Normal spleen  Adrenals/urinary tract: Adrenal glands and  kidneys are normal. The ureters and bladder normal.  Stomach/Bowel: Stomach, small-bowel, and cecum are normal. The colon is collapsed from the transverse to the descending colon. No obstructing lesion identified. Diverticula of sigmoid colon without acute inflammation.  Vascular/Lymphatic: Motion degradation of the upper abdomen. CTA examination aorta demonstrates minimal atherosclerotic calcification. The SMA and celiac trunk are widely patent. There is narrowing of the proximal celiac trunk with post stenotic dilatation seen best on sagittal image 86, series 11. The branches of study trunk are patent. The branches of the celiac trunk are patent. SMA is patent distally. The IMA is patent.  There bilateral single renal arteries. There is calcification the ostia of the renal arteries. Iliac arteries are normal. No aneurysm. Abdominal aorta is normal caliber. There is no retroperitoneal or periportal lymphadenopathy. No pelvic lymphadenopathy.  Reproductive: Post hysterectomy  Musculoskeletal: No aggressive osseous lesion. Severe degenerate change in the lumbar spine with endplate and facet hypertrophy.  Other: There is haziness to the upper abdominal mesentery beneath the pancreas. There is motion degradation through this region (image 66, series 4.  Review of the MIP images confirms the above findings.  IMPRESSION: 1. Motion degradation through the upper abdomen. The branches of the SMA and celiac trunk are widely patent. There is poststenotic dilatation of the celiac trunk. 2. Haziness of the mesentery inferior to the pancreas. This is not specific and has more of a pattern of pancreatitis than mesenteric ischemia. 3. Diverticulosis of the sigmoid colon without evidence of acute diverticulitis.   Electronically Signed   By: Suzy Bouchard M.D.   On: 10/25/2014 09:42     Assessment/Plan  Patient is elderly and frail. She will need additional recovery time in the skilled nursing facility to monitor her  anemia and recovery from the recent septic episode. She is engaged in physical therapy. Patient previously was living in an assisted living area at [facility]. Following recovery of strength and. Of observation in the SNF, we anticipate she will be able to return to the assisted living area of [facility].  Patient is unable to recall much of her hospitalization. She was delirious during the early phase of the hospital stay. I suspect she has mild underlying memory disorder. MMSE has not been done, but should be done in the future.  1. E-coli UTI Recovering from an episode of urosepsis. Patient finishing her antibiotics treatment, Cipro. Patient has been weakened by this episode.  2. Acute blood loss anemia Routine lab follow-up. Obtain CBC, serum iron and iron-binding capacity, reticulocyte count. Ferrous sulfate was started.  3. Acute lower GI bleeding Resolved and likely diverticular in origin.  4. History of cardiovascular disorder History of cardiovascular disease. Denies angina or palpitations. Continue to observe. Resume aspirin in September 2016 if there is no further bleeding.  5. Essential hypertension Continue amlodipine and losartan. Watch for ankle edema.  6. Abnormality of gait Continue strengthening and gait training with physical therapy  7. DDD (degenerative disc disease), lumbosacral Mild back discomfort of a chronic nature. Observe.   8. Diarrhea Controlled with Questran and Imodium. Diarrhea dates back about a month ago. She may be able to do without these drugs in the future.  9. Gastroesophageal reflux disease without esophagitis Asymptomatic. Continue pantoprazole for now.  10. Hyperlipidemia Follow-up  lab in the future. Not currently on any medications.  11. Overactive bladder Continue Myrbetriq.

## 2014-12-05 DIAGNOSIS — W19XXXA Unspecified fall, initial encounter: Secondary | ICD-10-CM | POA: Diagnosis not present

## 2014-12-05 DIAGNOSIS — M4806 Spinal stenosis, lumbar region: Secondary | ICD-10-CM | POA: Diagnosis not present

## 2014-12-05 DIAGNOSIS — R29898 Other symptoms and signs involving the musculoskeletal system: Secondary | ICD-10-CM | POA: Diagnosis not present

## 2014-12-05 DIAGNOSIS — R269 Unspecified abnormalities of gait and mobility: Secondary | ICD-10-CM | POA: Diagnosis not present

## 2014-12-05 DIAGNOSIS — R1313 Dysphagia, pharyngeal phase: Secondary | ICD-10-CM | POA: Diagnosis not present

## 2014-12-05 DIAGNOSIS — S82001D Unspecified fracture of right patella, subsequent encounter for closed fracture with routine healing: Secondary | ICD-10-CM | POA: Diagnosis not present

## 2014-12-05 DIAGNOSIS — R2681 Unsteadiness on feet: Secondary | ICD-10-CM | POA: Diagnosis not present

## 2014-12-05 DIAGNOSIS — M6281 Muscle weakness (generalized): Secondary | ICD-10-CM | POA: Diagnosis not present

## 2014-12-05 DIAGNOSIS — H81399 Other peripheral vertigo, unspecified ear: Secondary | ICD-10-CM | POA: Diagnosis not present

## 2014-12-05 DIAGNOSIS — Z9181 History of falling: Secondary | ICD-10-CM | POA: Diagnosis not present

## 2014-12-06 DIAGNOSIS — Z9181 History of falling: Secondary | ICD-10-CM | POA: Diagnosis not present

## 2014-12-06 DIAGNOSIS — R2681 Unsteadiness on feet: Secondary | ICD-10-CM | POA: Diagnosis not present

## 2014-12-06 DIAGNOSIS — R29898 Other symptoms and signs involving the musculoskeletal system: Secondary | ICD-10-CM | POA: Diagnosis not present

## 2014-12-06 DIAGNOSIS — R1313 Dysphagia, pharyngeal phase: Secondary | ICD-10-CM | POA: Diagnosis not present

## 2014-12-06 DIAGNOSIS — H81399 Other peripheral vertigo, unspecified ear: Secondary | ICD-10-CM | POA: Diagnosis not present

## 2014-12-06 DIAGNOSIS — M6281 Muscle weakness (generalized): Secondary | ICD-10-CM | POA: Diagnosis not present

## 2014-12-10 DIAGNOSIS — R1313 Dysphagia, pharyngeal phase: Secondary | ICD-10-CM | POA: Diagnosis not present

## 2014-12-10 DIAGNOSIS — M6281 Muscle weakness (generalized): Secondary | ICD-10-CM | POA: Diagnosis not present

## 2014-12-10 DIAGNOSIS — Z9181 History of falling: Secondary | ICD-10-CM | POA: Diagnosis not present

## 2014-12-10 DIAGNOSIS — R2681 Unsteadiness on feet: Secondary | ICD-10-CM | POA: Diagnosis not present

## 2014-12-10 DIAGNOSIS — H81399 Other peripheral vertigo, unspecified ear: Secondary | ICD-10-CM | POA: Diagnosis not present

## 2014-12-10 DIAGNOSIS — R29898 Other symptoms and signs involving the musculoskeletal system: Secondary | ICD-10-CM | POA: Diagnosis not present

## 2014-12-11 DIAGNOSIS — R1313 Dysphagia, pharyngeal phase: Secondary | ICD-10-CM | POA: Diagnosis not present

## 2014-12-11 DIAGNOSIS — R2681 Unsteadiness on feet: Secondary | ICD-10-CM | POA: Diagnosis not present

## 2014-12-11 DIAGNOSIS — R29898 Other symptoms and signs involving the musculoskeletal system: Secondary | ICD-10-CM | POA: Diagnosis not present

## 2014-12-11 DIAGNOSIS — Z9181 History of falling: Secondary | ICD-10-CM | POA: Diagnosis not present

## 2014-12-11 DIAGNOSIS — H81399 Other peripheral vertigo, unspecified ear: Secondary | ICD-10-CM | POA: Diagnosis not present

## 2014-12-11 DIAGNOSIS — M6281 Muscle weakness (generalized): Secondary | ICD-10-CM | POA: Diagnosis not present

## 2014-12-12 DIAGNOSIS — Z9181 History of falling: Secondary | ICD-10-CM | POA: Diagnosis not present

## 2014-12-12 DIAGNOSIS — R29898 Other symptoms and signs involving the musculoskeletal system: Secondary | ICD-10-CM | POA: Diagnosis not present

## 2014-12-12 DIAGNOSIS — R2681 Unsteadiness on feet: Secondary | ICD-10-CM | POA: Diagnosis not present

## 2014-12-12 DIAGNOSIS — M6281 Muscle weakness (generalized): Secondary | ICD-10-CM | POA: Diagnosis not present

## 2014-12-12 DIAGNOSIS — H81399 Other peripheral vertigo, unspecified ear: Secondary | ICD-10-CM | POA: Diagnosis not present

## 2014-12-12 DIAGNOSIS — R1313 Dysphagia, pharyngeal phase: Secondary | ICD-10-CM | POA: Diagnosis not present

## 2014-12-17 DIAGNOSIS — Z9181 History of falling: Secondary | ICD-10-CM | POA: Diagnosis not present

## 2014-12-17 DIAGNOSIS — W19XXXA Unspecified fall, initial encounter: Secondary | ICD-10-CM | POA: Diagnosis not present

## 2014-12-17 DIAGNOSIS — M6281 Muscle weakness (generalized): Secondary | ICD-10-CM | POA: Diagnosis not present

## 2014-12-17 DIAGNOSIS — S82001D Unspecified fracture of right patella, subsequent encounter for closed fracture with routine healing: Secondary | ICD-10-CM | POA: Diagnosis not present

## 2014-12-17 DIAGNOSIS — R269 Unspecified abnormalities of gait and mobility: Secondary | ICD-10-CM | POA: Diagnosis not present

## 2014-12-17 DIAGNOSIS — M4806 Spinal stenosis, lumbar region: Secondary | ICD-10-CM | POA: Diagnosis not present

## 2014-12-17 DIAGNOSIS — K2971 Gastritis, unspecified, with bleeding: Secondary | ICD-10-CM | POA: Diagnosis not present

## 2014-12-17 DIAGNOSIS — R29898 Other symptoms and signs involving the musculoskeletal system: Secondary | ICD-10-CM | POA: Diagnosis not present

## 2014-12-17 DIAGNOSIS — R2681 Unsteadiness on feet: Secondary | ICD-10-CM | POA: Diagnosis not present

## 2014-12-17 DIAGNOSIS — H81399 Other peripheral vertigo, unspecified ear: Secondary | ICD-10-CM | POA: Diagnosis not present

## 2014-12-18 DIAGNOSIS — R2681 Unsteadiness on feet: Secondary | ICD-10-CM | POA: Diagnosis not present

## 2014-12-18 DIAGNOSIS — H81399 Other peripheral vertigo, unspecified ear: Secondary | ICD-10-CM | POA: Diagnosis not present

## 2014-12-18 DIAGNOSIS — K2971 Gastritis, unspecified, with bleeding: Secondary | ICD-10-CM | POA: Diagnosis not present

## 2014-12-18 DIAGNOSIS — R29898 Other symptoms and signs involving the musculoskeletal system: Secondary | ICD-10-CM | POA: Diagnosis not present

## 2014-12-18 DIAGNOSIS — Z9181 History of falling: Secondary | ICD-10-CM | POA: Diagnosis not present

## 2014-12-18 DIAGNOSIS — M6281 Muscle weakness (generalized): Secondary | ICD-10-CM | POA: Diagnosis not present

## 2014-12-19 DIAGNOSIS — M6281 Muscle weakness (generalized): Secondary | ICD-10-CM | POA: Diagnosis not present

## 2014-12-19 DIAGNOSIS — R2681 Unsteadiness on feet: Secondary | ICD-10-CM | POA: Diagnosis not present

## 2014-12-19 DIAGNOSIS — Z9181 History of falling: Secondary | ICD-10-CM | POA: Diagnosis not present

## 2014-12-19 DIAGNOSIS — H81399 Other peripheral vertigo, unspecified ear: Secondary | ICD-10-CM | POA: Diagnosis not present

## 2014-12-19 DIAGNOSIS — R29898 Other symptoms and signs involving the musculoskeletal system: Secondary | ICD-10-CM | POA: Diagnosis not present

## 2014-12-19 DIAGNOSIS — K2971 Gastritis, unspecified, with bleeding: Secondary | ICD-10-CM | POA: Diagnosis not present

## 2014-12-20 DIAGNOSIS — R29898 Other symptoms and signs involving the musculoskeletal system: Secondary | ICD-10-CM | POA: Diagnosis not present

## 2014-12-20 DIAGNOSIS — K2971 Gastritis, unspecified, with bleeding: Secondary | ICD-10-CM | POA: Diagnosis not present

## 2014-12-20 DIAGNOSIS — M6281 Muscle weakness (generalized): Secondary | ICD-10-CM | POA: Diagnosis not present

## 2014-12-20 DIAGNOSIS — R2681 Unsteadiness on feet: Secondary | ICD-10-CM | POA: Diagnosis not present

## 2014-12-20 DIAGNOSIS — Z9181 History of falling: Secondary | ICD-10-CM | POA: Diagnosis not present

## 2014-12-20 DIAGNOSIS — H81399 Other peripheral vertigo, unspecified ear: Secondary | ICD-10-CM | POA: Diagnosis not present

## 2014-12-24 DIAGNOSIS — R29898 Other symptoms and signs involving the musculoskeletal system: Secondary | ICD-10-CM | POA: Diagnosis not present

## 2014-12-24 DIAGNOSIS — K2971 Gastritis, unspecified, with bleeding: Secondary | ICD-10-CM | POA: Diagnosis not present

## 2014-12-24 DIAGNOSIS — M6281 Muscle weakness (generalized): Secondary | ICD-10-CM | POA: Diagnosis not present

## 2014-12-24 DIAGNOSIS — H81399 Other peripheral vertigo, unspecified ear: Secondary | ICD-10-CM | POA: Diagnosis not present

## 2014-12-24 DIAGNOSIS — Z9181 History of falling: Secondary | ICD-10-CM | POA: Diagnosis not present

## 2014-12-24 DIAGNOSIS — R2681 Unsteadiness on feet: Secondary | ICD-10-CM | POA: Diagnosis not present

## 2015-05-09 ENCOUNTER — Encounter: Payer: Self-pay | Admitting: Nurse Practitioner

## 2015-05-09 ENCOUNTER — Non-Acute Institutional Stay: Payer: Medicare Other | Admitting: Nurse Practitioner

## 2015-05-09 DIAGNOSIS — K219 Gastro-esophageal reflux disease without esophagitis: Secondary | ICD-10-CM

## 2015-05-09 DIAGNOSIS — J449 Chronic obstructive pulmonary disease, unspecified: Secondary | ICD-10-CM | POA: Insufficient documentation

## 2015-05-09 DIAGNOSIS — I10 Essential (primary) hypertension: Secondary | ICD-10-CM | POA: Diagnosis not present

## 2015-05-09 DIAGNOSIS — J208 Acute bronchitis due to other specified organisms: Secondary | ICD-10-CM | POA: Diagnosis not present

## 2015-05-09 DIAGNOSIS — R05 Cough: Secondary | ICD-10-CM | POA: Diagnosis not present

## 2015-05-09 DIAGNOSIS — N3281 Overactive bladder: Secondary | ICD-10-CM | POA: Diagnosis not present

## 2015-05-09 DIAGNOSIS — D62 Acute posthemorrhagic anemia: Secondary | ICD-10-CM | POA: Diagnosis not present

## 2015-05-09 NOTE — Assessment & Plan Note (Addendum)
Controlled, takes Amlodipine 5mg , Losartan 100mg , update CMP, TSH

## 2015-05-09 NOTE — Assessment & Plan Note (Signed)
Asymptomatic, continue Omeprazole.  

## 2015-05-09 NOTE — Progress Notes (Signed)
This encounter was created in error - please disregard.

## 2015-05-09 NOTE — Progress Notes (Signed)
Patient ID: Brandy Crawford, female   DOB: 06/02/1923, 82 y.o.   MRN: [id number]  Location:  [facility] Provider:  Marlana Latus NP  Code Status:  DNR Goals of care: Advanced Directive information Does patient have an advance directive?: Yes, Type of Advance Directive: Out of facility DNR (pink MOST or yellow form);Healthcare Power of Geographical information systems officer Complaint  Patient presents with  . Acute Visit    congestive cough, started 3 days ago, yellow mucus     HPI: Patient is a 82 y.o. female seen in the [facility] at [facility] today for evaluation of  Congestive cough, yellow phlegm x 3 days, denied chest pain, SOB, or palpitation, nausea, vomiting, or dysuria. She is afebrile. Hx of HTN, controlled, on Amlodipine, Losartan, anemia on Fe,  urinary frequency, taking Myrbetriq, 3-4x/night bathroom trips, no difficulty return to sleep, GERD on Omeprazole.   Review of Systems  Constitutional: Negative for fever, chills and diaphoresis.  HENT: Positive for hearing loss. Negative for congestion, ear discharge, ear pain, nosebleeds, sore throat and tinnitus.   Eyes: Negative for photophobia, pain, discharge and redness.  Respiratory: Positive for cough and sputum production. Negative for shortness of breath, wheezing and stridor.        Congestive cough, yellow phlegm x 3 days  Cardiovascular: Positive for leg swelling. Negative for chest pain and palpitations.       Trace  Gastrointestinal: Positive for diarrhea (Under control with Questran and Imodium). Negative for nausea, vomiting, abdominal pain, constipation and blood in stool.  Genitourinary: Positive for frequency. Negative for dysuria, urgency, hematuria and flank pain.       1-2x/night. Stress incontinence  Musculoskeletal: Positive for back pain. Negative for myalgias and neck pain.  Skin: Negative.  Negative for rash.  Neurological: Negative for dizziness, tremors, seizures, weakness and headaches.       Memory loss. Patient had  delirium when septic.  Endo/Heme/Allergies: Negative for environmental allergies and polydipsia. Does not bruise/bleed easily.       Anemic secondary to acute blood loss from lower GI bleeding  Psychiatric/Behavioral: Negative for suicidal ideas and hallucinations. The patient is not nervous/anxious.     Past Medical History  Diagnosis Date  . Asthma   . GERD (gastroesophageal reflux disease)   . Hypertension   . TIA (transient ischemic attack) 09/20/2006  . Cervical disc disease   . Anemia   . Depression   . Lumbosacral spondylosis 10/28/2009  . Abnormality of gait 04/28/2010    History of fall onto right shoulder-has participated in PT. Uses walker.     . Acute lower GI bleeding 10/30/2014  . Fall at nursing home 10/25/2014  . History of cardiovascular disorder 09/20/2006    X 3 in 2000 now on aspirin.    Marland Kitchen Hyperlipidemia 09/20/2006    05/15/14 LDL 95   . OSTEOPOROSIS 09/20/2006    Noted 2008    . Overactive bladder 11/29/2013    Myrbetriq prescribed by urology.    . SCOLIOSIS 10/28/2009  . Diarrhea 10/25/14  . Acute encephalopathy 10/25/14    Occurred when septic  . Diverticulosis 10/27/2014  . Cerebrovascular disease 10/27/2014    Small vessel disease  . Cerebral atrophy 10/27/2014  . Aortic atherosclerosis ([place]) 10/27/2014  . Degenerative disc disease, lumbar 10/27/2014  . Scoliosis 10/27/2014    Patient Active Problem List   Diagnosis Date Noted  . Acute bronchitis 05/09/2015  . Acute lower GI bleeding 10/30/2014  . E-coli UTI 10/30/2014  .  Acute blood loss anemia 10/30/2014  . Fall at nursing home 10/25/2014  . Diarrhea 10/19/2014  . Overactive bladder 11/29/2013  . Abnormality of gait 04/28/2010  . DDD (degenerative disc disease), lumbosacral 10/28/2009  . SCOLIOSIS 10/28/2009  . Hyperlipidemia 09/20/2006  . Essential hypertension 09/20/2006  . Asthma 09/20/2006  . GERD 09/20/2006  . OSTEOPOROSIS 09/20/2006  . History of cardiovascular disorder 09/20/2006     Allergies  Allergen Reactions  . Tramadol Other (See Comments)    Hallucinations, agitation/combativeness  . Amoxicillin     Per MAR  . Oxycodone Other (See Comments)    Per MAR   . Sulfamethoxazole     Per MAR   . Penicillins Rash    Medications: Patient's Medications  New Prescriptions   No medications on file  Previous Medications   ACETAMINOPHEN (TYLENOL) 325 MG TABLET    Take 650 mg by mouth 3 (three) times daily. Also take 650mg  for fever >101 x1 dose. And notify MD   ALBUTEROL (PROVENTIL HFA;VENTOLIN HFA) 108 (90 BASE) MCG/ACT INHALER    Inhale 2 puffs into the lungs every 6 (six) hours as needed for wheezing or shortness of breath.   AMLODIPINE (NORVASC) 5 MG TABLET    Take 5 mg by mouth daily with breakfast.    CHOLESTYRAMINE (QUESTRAN) 4 G PACKET    Take 4 g by mouth 2 (two) times daily.   FERROUS SULFATE 325 (65 FE) MG TABLET    Take 325 mg by mouth daily.   LOPERAMIDE (IMODIUM) 2 MG CAPSULE    Take one to two tablets by mouth as needed for diarrhea   LOSARTAN (COZAAR) 100 MG TABLET    Take 100 mg by mouth daily with breakfast.    MIRABEGRON ER (MYRBETRIQ) 25 MG TB24    Take 25 mg by mouth daily with breakfast. Prescribed by urology   OMEPRAZOLE (PRILOSEC) 20 MG CAPSULE    Take 20 mg by mouth daily.   PYRIDOXINE (VITAMIN B-6) 100 MG TABLET    Take 100 mg by mouth daily with breakfast.   Modified Medications   No medications on file  Discontinued Medications   ASCORBIC ACID (VITAMIN C) 500 MG TABLET    Take 500 mg by mouth daily with breakfast. Reported on 05/09/2015   PANTOPRAZOLE (PROTONIX) 40 MG TABLET    Take 1 tablet (40 mg total) by mouth daily.   TRAMADOL (ULTRAM) 50 MG TABLET    Take 1 tablet (50 mg total) by mouth every 12 (twelve) hours as needed for moderate pain.   VITAMIN E (VITAMIN E) 400 UNIT CAPSULE    Take 400 Units by mouth daily with breakfast.     Physical Exam: Filed Vitals:   05/09/15 0913  BP: 139/77  Pulse: 86  Temp: 99.8 F (37.7 C)   TempSrc: Oral   There is no weight on file to calculate BMI.  Physical Exam  Constitutional: She is oriented to person, place, and time. She appears well-developed and well-nourished. No distress.  HENT:  Head: Normocephalic and atraumatic.  Right Ear: No lacerations. No drainage. No foreign bodies. No mastoid tenderness. Tympanic membrane is not injected, not scarred, not perforated, not erythematous, not retracted and not bulging. Tympanic membrane mobility is abnormal. No middle ear effusion. Decreased hearing is noted.  Left Ear: No lacerations. No drainage. No foreign bodies. No mastoid tenderness. Tympanic membrane is not injected, not scarred, not perforated, not erythematous, not retracted and not bulging. Tympanic membrane mobility is abnormal.  No  middle ear effusion. Decreased hearing is noted.  Nose: Nose normal.  Mouth/Throat: Oropharynx is clear and moist. No oropharyngeal exudate.  Eyes: Conjunctivae and EOM are normal. Pupils are equal, round, and reactive to light. Right eye exhibits no discharge. Left eye exhibits no discharge. No scleral icterus.  Neck: Normal range of motion. Neck supple. No JVD present. No tracheal deviation present. No thyromegaly present.  Cardiovascular: Normal rate, regular rhythm, normal heart sounds and intact distal pulses.  Exam reveals no gallop and no friction rub.   No murmur heard. Pulmonary/Chest: Effort normal. No stridor. No respiratory distress. She has no wheezes. She has rales. She exhibits no tenderness.  Coarse rales posterior mid to lower lungs.  Abdominal: Soft. Bowel sounds are normal. She exhibits no distension and no mass. There is no tenderness. There is no rebound and no guarding.  Genitourinary: Guaiac negative stool.  Musculoskeletal: She exhibits edema. She exhibits no tenderness.  Trace in ankle.   Lymphadenopathy:    She has no cervical adenopathy.  Neurological: She is alert and oriented to person, place, and time. She  has normal reflexes. No cranial nerve deficit. She exhibits normal muscle tone. Coordination normal.  Skin: Skin is warm and dry. No rash noted. She is not diaphoretic. No erythema. No pallor.  Lots of moles.   Psychiatric: She has a normal mood and affect. Her behavior is normal. Judgment and thought content normal.    Labs reviewed: Basic Metabolic Panel:  Recent Labs  10/25/14 0714 10/26/14 0522 10/29/14 1920  NA 134* 140 139  K 4.6 3.7 3.9  CL 105 110 108  CO2 23 24 26   GLUCOSE 164* 100* 167*  BUN 28* 13 <5*  CREATININE 0.67 0.66 0.58  CALCIUM 9.5 9.0 9.1    Liver Function Tests:  Recent Labs  05/15/14 10/23/14 10/25/14 0714  AST 18 15 23   ALT 15 16 22   ALKPHOS 49 58 55  BILITOT  --   --  0.9  PROT  --   --  6.4*  ALBUMIN  --   --  3.7    CBC:  Recent Labs  10/25/14 0714  10/28/14 1059 10/29/14 0413 10/29/14 1920 10/30/14 0808  WBC 20.2*  < > 9.9  --  8.0 6.8  NEUTROABS 19.1*  --   --   --   --   --   HGB 11.0*  < > 9.7* 8.4* 9.5* 9.5*  HCT 32.7*  < > 29.2* 25.1* 28.6* 28.7*  MCV 92.1  < > 96.1  --  96.9 94.7  PLT 333  < > 378  --  419* 393  < > = values in this interval not displayed.  Lab Results  Component Value Date   TSH 2.170 10/29/2014   Lab Results  Component Value Date   HGBA1C 5.9 08/24/2011   Lab Results  Component Value Date   CHOL 176 05/15/2014   HDL 62 05/15/2014   LDLCALC 95 05/15/2014   LDLDIRECT 114.3 12/08/2007   TRIG 93 05/15/2014   CHOLHDL 2.9 CALC 12/08/2007    Significant Diagnostic Results since last visit: none  Patient Care Team: Estill Dooms, MD as PCP - General (Internal Medicine) Man [name] X, NP as Nurse Practitioner (Nurse Practitioner)  Assessment/Plan Problem List Items Addressed This Visit    Overactive bladder - Primary (Chronic)    Saw Urology 06/06/14-continued with Myrbetriq-1-2x/nihgt bathroom trips. Stress incontinent in natures. 3-4x/night      GERD (Chronic)    Asymptomatic,  continue  Omeprazole       Relevant Medications   omeprazole (PRILOSEC) 20 MG capsule   Essential hypertension (Chronic)    Controlled, takes Amlodipine 5mg , Losartan 100mg , update CMP, TSH      Acute bronchitis    Congestive cough, yellow phlegm x 3 days, Avelox 400mg  daily x 7 days, Mucinex 600mg  bid x 3 days, CXR to r/o PNA.       Acute blood loss anemia (Chronic)    Continue Fe, update CBC      Relevant Medications   ferrous sulfate 325 (65 FE) MG tablet       Family/ staff Communication: observe the patient  Labs/tests ordered: CBC, CMP, TSH, CXR  [facility] [name] NP Geriatrics [facility] [address], [place] [address] On Call:  [phone] & follow prompts after 5pm & weekends Office Phone:  [phone] Office Fax:  [phone]

## 2015-05-09 NOTE — Assessment & Plan Note (Signed)
Congestive cough, yellow phlegm x 3 days, Avelox 400mg  daily x 7 days, Mucinex 600mg  bid x 3 days, CXR to r/o PNA.

## 2015-05-09 NOTE — Assessment & Plan Note (Signed)
Continue Fe, update CBC

## 2015-05-09 NOTE — Assessment & Plan Note (Signed)
Saw Urology 06/06/14-continued with Myrbetriq-1-2x/nihgt bathroom trips. Stress incontinent in natures. 3-4x/night

## 2015-05-14 DIAGNOSIS — I10 Essential (primary) hypertension: Secondary | ICD-10-CM | POA: Diagnosis not present

## 2015-05-14 LAB — HEPATIC FUNCTION PANEL
ALT: 15 U/L (ref 7–35)
AST: 16 U/L (ref 13–35)
Alkaline Phosphatase: 59 U/L (ref 25–125)
Bilirubin, Total: 0.4 mg/dL

## 2015-05-14 LAB — BASIC METABOLIC PANEL
BUN: 13 mg/dL (ref 4–21)
Creatinine: 0.6 mg/dL (ref 0.5–1.1)
Glucose: 87 mg/dL
Potassium: 4 mmol/L (ref 3.4–5.3)
Sodium: 140 mmol/L (ref 137–147)

## 2015-05-14 LAB — CBC AND DIFFERENTIAL
HCT: 39 % (ref 36–46)
Hemoglobin: 13.2 g/dL (ref 12.0–16.0)
PLATELETS: 322 10*3/uL (ref 150–399)
WBC: 6.7 10*3/mL

## 2015-05-23 ENCOUNTER — Non-Acute Institutional Stay: Payer: Medicare Other | Admitting: Nurse Practitioner

## 2015-05-23 VITALS — BP 162/79 | HR 84 | Temp 97.1°F | Wt 150.8 lb

## 2015-05-23 DIAGNOSIS — I10 Essential (primary) hypertension: Secondary | ICD-10-CM | POA: Diagnosis not present

## 2015-05-23 DIAGNOSIS — M5137 Other intervertebral disc degeneration, lumbosacral region: Secondary | ICD-10-CM

## 2015-05-23 DIAGNOSIS — K219 Gastro-esophageal reflux disease without esophagitis: Secondary | ICD-10-CM | POA: Diagnosis not present

## 2015-05-23 DIAGNOSIS — D62 Acute posthemorrhagic anemia: Secondary | ICD-10-CM

## 2015-05-23 DIAGNOSIS — J209 Acute bronchitis, unspecified: Secondary | ICD-10-CM

## 2015-05-23 DIAGNOSIS — N3281 Overactive bladder: Secondary | ICD-10-CM | POA: Diagnosis not present

## 2015-05-23 MED ORDER — HYDROCODONE-ACETAMINOPHEN 5-325 MG PO TABS: ORAL_TABLET | ORAL | Status: DC

## 2015-05-23 NOTE — Assessment & Plan Note (Signed)
Controlled, continue Amlodipine 5mg , Losartan 100mg ,

## 2015-05-23 NOTE — Progress Notes (Signed)
Patient ID: Brandy Crawford, female   DOB: Feb 01, 1924, 82 y.o.   MRN: [id number]  Location:  Clinic [facility] Provider:  Marlana Latus NP  Code Status:  DNR Goals of care: Advanced Directive information    Chief Complaint  Patient presents with  . Acute Visit    Complains of Pain in Left hip down into leg. Wants referral to Orthopedic Dr. French Ana     HPI: Patient is a 82 y.o. female seen in the clinic at [facility] today for evaluation of left lower back, hip, thigh pain x 1 week, positional and with weight bearing, not waking  her night sleep. Denied fall or trauma. She is afebrile. Completed Avelox for acute bronchitis. Hx of HTN, controlled, on Amlodipine, Losartan, anemia on Fe,  urinary frequency, taking Myrbetriq, 3-4x/night bathroom trips, no difficulty return to sleep, GERD on Omeprazole.   Review of Systems  Constitutional: Negative for fever, chills and diaphoresis.  HENT: Positive for hearing loss. Negative for congestion, ear discharge, ear pain, nosebleeds, sore throat and tinnitus.   Eyes: Negative for photophobia, pain, discharge and redness.  Respiratory: Negative for cough, sputum production, shortness of breath, wheezing and stridor.        Resolved congestive cough, yellow phlegm   Cardiovascular: Positive for leg swelling. Negative for chest pain and palpitations.       Trace  Gastrointestinal: Positive for diarrhea (Under control with Questran and Imodium). Negative for nausea, vomiting, abdominal pain, constipation and blood in stool.  Genitourinary: Positive for frequency. Negative for dysuria, urgency, hematuria and flank pain.       1-2x/night. Stress incontinence  Musculoskeletal: Positive for back pain. Negative for myalgias and neck pain.       Left lower back, left hip, left thigh pain with weight bearing and position changes.   Skin: Negative.  Negative for rash.  Neurological: Negative for dizziness, tremors, seizures, weakness and headaches.   Memory loss. Patient had delirium when septic.  Endo/Heme/Allergies: Negative for environmental allergies and polydipsia. Does not bruise/bleed easily.       Anemic secondary to acute blood loss from lower GI bleeding  Psychiatric/Behavioral: Negative for suicidal ideas and hallucinations. The patient is not nervous/anxious.     Past Medical History  Diagnosis Date  . Asthma   . GERD (gastroesophageal reflux disease)   . Hypertension   . TIA (transient ischemic attack) 09/20/2006  . Cervical disc disease   . Anemia   . Depression   . Lumbosacral spondylosis 10/28/2009  . Abnormality of gait 04/28/2010    History of fall onto right shoulder-has participated in PT. Uses walker.     . Acute lower GI bleeding 10/30/2014  . Fall at nursing home 10/25/2014  . History of cardiovascular disorder 09/20/2006    X 3 in 2000 now on aspirin.    Marland Kitchen Hyperlipidemia 09/20/2006    05/15/14 LDL 95   . OSTEOPOROSIS 09/20/2006    Noted 2008    . Overactive bladder 11/29/2013    Myrbetriq prescribed by urology.    . SCOLIOSIS 10/28/2009  . Diarrhea 10/25/14  . Acute encephalopathy 10/25/14    Occurred when septic  . Diverticulosis 10/27/2014  . Cerebrovascular disease 10/27/2014    Small vessel disease  . Cerebral atrophy 10/27/2014  . Aortic atherosclerosis ([place]) 10/27/2014  . Degenerative disc disease, lumbar 10/27/2014  . Scoliosis 10/27/2014    Patient Active Problem List   Diagnosis Date Noted  . Acute bronchitis 05/09/2015  . Acute lower GI  bleeding 10/30/2014  . E-coli UTI 10/30/2014  . Acute blood loss anemia 10/30/2014  . Fall at nursing home 10/25/2014  . Diarrhea 10/19/2014  . Overactive bladder 11/29/2013  . Abnormality of gait 04/28/2010  . DDD (degenerative disc disease), lumbosacral 10/28/2009  . SCOLIOSIS 10/28/2009  . Hyperlipidemia 09/20/2006  . Essential hypertension 09/20/2006  . Asthma 09/20/2006  . GERD 09/20/2006  . OSTEOPOROSIS 09/20/2006  . History of cardiovascular  disorder 09/20/2006    Allergies  Allergen Reactions  . Tramadol Other (See Comments)    Hallucinations, agitation/combativeness  . Amoxicillin     Per MAR  . Oxycodone Other (See Comments)    Per MAR   . Sulfamethoxazole     Per MAR   . Penicillins Rash    Medications: Patient's Medications  New Prescriptions   HYDROCODONE-ACETAMINOPHEN (NORCO) 5-325 MG TABLET    Take 1/2 tablet by mouth three times daily for pain  Previous Medications   ALBUTEROL (PROVENTIL HFA;VENTOLIN HFA) 108 (90 BASE) MCG/ACT INHALER    Inhale 2 puffs into the lungs every 6 (six) hours as needed for wheezing or shortness of breath.   AMLODIPINE (NORVASC) 5 MG TABLET    Take 5 mg by mouth daily with breakfast.    CHOLESTYRAMINE (QUESTRAN) 4 G PACKET    Take 4 g by mouth 2 (two) times daily.   FERROUS SULFATE 325 (65 FE) MG TABLET    Take 325 mg by mouth daily.   LOPERAMIDE (IMODIUM) 2 MG CAPSULE    Take one to two tablets by mouth as needed for diarrhea   LOSARTAN (COZAAR) 100 MG TABLET    Take 100 mg by mouth daily with breakfast.    MIRABEGRON ER (MYRBETRIQ) 25 MG TB24    Take 25 mg by mouth daily with breakfast. Prescribed by urology   OMEPRAZOLE (PRILOSEC) 20 MG CAPSULE    Take 20 mg by mouth daily.   PYRIDOXINE (VITAMIN B-6) 100 MG TABLET    Take 100 mg by mouth daily with breakfast.   Modified Medications   No medications on file  Discontinued Medications   ACETAMINOPHEN (TYLENOL) 325 MG TABLET    Take 650 mg by mouth 3 (three) times daily. Also take 650mg  for fever >101 x1 dose. And notify MD    Physical Exam: Filed Vitals:   05/23/15 1544  BP: 162/79  Pulse: 84  Temp: 97.1 F (36.2 C)  TempSrc: Oral  Weight: 150 lb 12.8 oz (68.402 kg)  SpO2: 94%   Body mass index is 24.7 kg/(m^2).  Physical Exam  Constitutional: She is oriented to person, place, and time. She appears well-developed and well-nourished. No distress.  HENT:  Head: Normocephalic and atraumatic.  Right Ear: No lacerations.  No drainage. No foreign bodies. No mastoid tenderness. Tympanic membrane is not injected, not scarred, not perforated, not erythematous, not retracted and not bulging. Tympanic membrane mobility is abnormal. No middle ear effusion. Decreased hearing is noted.  Left Ear: No lacerations. No drainage. No foreign bodies. No mastoid tenderness. Tympanic membrane is not injected, not scarred, not perforated, not erythematous, not retracted and not bulging. Tympanic membrane mobility is abnormal.  No middle ear effusion. Decreased hearing is noted.  Nose: Nose normal.  Mouth/Throat: Oropharynx is clear and moist. No oropharyngeal exudate.  Eyes: Conjunctivae and EOM are normal. Pupils are equal, round, and reactive to light. Right eye exhibits no discharge. Left eye exhibits no discharge. No scleral icterus.  Neck: Normal range of motion. Neck supple. No JVD  present. No tracheal deviation present. No thyromegaly present.  Cardiovascular: Normal rate, regular rhythm, normal heart sounds and intact distal pulses.  Exam reveals no gallop and no friction rub.   No murmur heard. Pulmonary/Chest: Effort normal. No stridor. No respiratory distress. She has no wheezes. She has no rales. She exhibits no tenderness.  Coarse rales posterior mid to lower lungs.  Abdominal: Soft. Bowel sounds are normal. She exhibits no distension and no mass. There is no tenderness. There is no rebound and no guarding.  Genitourinary: Guaiac negative stool.  Musculoskeletal: She exhibits edema. She exhibits no tenderness.  Trace in ankle. Left lower back, left hip, left thigh pain with weight bearing and position changes.    Lymphadenopathy:    She has no cervical adenopathy.  Neurological: She is alert and oriented to person, place, and time. She has normal reflexes. No cranial nerve deficit. She exhibits normal muscle tone. Coordination normal.  Skin: Skin is warm and dry. No rash noted. She is not diaphoretic. No erythema. No  pallor.  Lots of moles.   Psychiatric: She has a normal mood and affect. Her behavior is normal. Judgment and thought content normal.    Labs reviewed: Basic Metabolic Panel:  Recent Labs  10/25/14 0714 10/26/14 0522 10/29/14 1920 05/14/15  NA 134* 140 139 140  K 4.6 3.7 3.9 4.0  CL 105 110 108  --   CO2 23 24 26   --   GLUCOSE 164* 100* 167*  --   BUN 28* 13 <5* 13  CREATININE 0.67 0.66 0.58 0.6  CALCIUM 9.5 9.0 9.1  --     Liver Function Tests:  Recent Labs  10/23/14 10/25/14 0714 05/14/15  AST 15 23 16   ALT 16 22 15   ALKPHOS 58 55 59  BILITOT  --  0.9  --   PROT  --  6.4*  --   ALBUMIN  --  3.7  --     CBC:  Recent Labs  10/25/14 0714  10/28/14 1059  10/29/14 1920 10/30/14 0808 05/14/15  WBC 20.2*  < > 9.9  --  8.0 6.8 6.7  NEUTROABS 19.1*  --   --   --   --   --   --   HGB 11.0*  < > 9.7*  < > 9.5* 9.5* 13.2  HCT 32.7*  < > 29.2*  < > 28.6* 28.7* 39  MCV 92.1  < > 96.1  --  96.9 94.7  --   PLT 333  < > 378  --  419* 393 322  < > = values in this interval not displayed.  Lab Results  Component Value Date   TSH 2.170 10/29/2014   Lab Results  Component Value Date   HGBA1C 5.9 08/24/2011   Lab Results  Component Value Date   CHOL 176 05/15/2014   HDL 62 05/15/2014   LDLCALC 95 05/15/2014   LDLDIRECT 114.3 12/08/2007   TRIG 93 05/15/2014   CHOLHDL 2.9 CALC 12/08/2007    Significant Diagnostic Results since last visit: none  Patient Care Team: Estill Dooms, MD as PCP - General (Internal Medicine) Sherrie Marsan X, NP as Nurse Practitioner (Nurse Practitioner)  Assessment/Plan Problem List Items Addressed This Visit    Essential hypertension (Chronic)    Controlled, continue Amlodipine 5mg , Losartan 100mg ,      GERD (Chronic)    Asymptomatic, continue Omeprazole       DDD (degenerative disc disease), lumbosacral (Chronic)    Including lumbar spondylosis.  Regular f/u Dr. French Ana. Receives injections last 01/2013.  Flare up left lower  back, left hip, left thigh pain with weight bearing and position changes. X-ray lumbar spine, hip, thigh. Norco 5/325mg  1/2 tid.       Relevant Medications   HYDROcodone-acetaminophen (NORCO) 5-325 MG tablet   Overactive bladder (Chronic)    Saw Urology 06/06/14-continued with Myrbetriq.  Stress incontinent in natures. 3-4x/night      Acute blood loss anemia (Chronic)    05/14/15 Hgb 13.2, continue Fe      Acute bronchitis - Primary    Congestive cough, yellow phlegm resolved, completed Avelox 400mg  daily x 7 days, Mucinex 600mg  bid x 3 days, CXR ruled out PNA 05/09/15          Family/ staff Communication: observe the patient  Labs/tests ordered: X-ray lumbar spine, pelvic, left hip.   [facility] Axle Parfait NP Geriatrics [facility] [phone], [place] [address] On Call:  [phone] & follow prompts after 5pm & weekends Office Phone:  [phone] Office Fax:  [phone]

## 2015-05-23 NOTE — Assessment & Plan Note (Signed)
Congestive cough, yellow phlegm resolved, completed Avelox 400mg  daily x 7 days, Mucinex 600mg  bid x 3 days, CXR ruled out PNA 05/09/15

## 2015-05-23 NOTE — Assessment & Plan Note (Signed)
Asymptomatic, continue Omeprazole.  

## 2015-05-23 NOTE — Assessment & Plan Note (Signed)
Including lumbar spondylosis. Regular f/u Dr. French Ana. Receives injections last 01/2013.  Flare up left lower back, left hip, left thigh pain with weight bearing and position changes. X-ray lumbar spine, hip, thigh. Norco 5/325mg  1/2 tid.

## 2015-05-23 NOTE — Assessment & Plan Note (Signed)
Saw Urology 06/06/14-continued with Myrbetriq.  Stress incontinent in natures. 3-4x/night

## 2015-05-23 NOTE — Assessment & Plan Note (Signed)
05/14/15 Hgb 13.2, continue Fe

## 2015-05-24 DIAGNOSIS — M545 Low back pain: Secondary | ICD-10-CM | POA: Diagnosis not present

## 2015-05-24 DIAGNOSIS — M25559 Pain in unspecified hip: Secondary | ICD-10-CM | POA: Diagnosis not present

## 2015-05-29 DIAGNOSIS — M542 Cervicalgia: Secondary | ICD-10-CM | POA: Diagnosis not present

## 2015-05-30 ENCOUNTER — Inpatient Hospital Stay (HOSPITAL_COMMUNITY)
Admission: EM | Admit: 2015-05-30 | Discharge: 2015-06-03 | DRG: 871 | Disposition: A | Discharge status: Skilled Nursing Facility | Payer: Medicare Other | Attending: Internal Medicine | Admitting: Internal Medicine

## 2015-05-30 ENCOUNTER — Emergency Department (HOSPITAL_COMMUNITY): Payer: Medicare Other

## 2015-05-30 ENCOUNTER — Non-Acute Institutional Stay: Payer: Medicare Other | Admitting: Nurse Practitioner

## 2015-05-30 ENCOUNTER — Encounter (HOSPITAL_COMMUNITY): Payer: Self-pay | Admitting: *Deleted

## 2015-05-30 ENCOUNTER — Inpatient Hospital Stay (HOSPITAL_COMMUNITY): Payer: Medicare Other

## 2015-05-30 ENCOUNTER — Encounter: Payer: Self-pay | Admitting: Nurse Practitioner

## 2015-05-30 DIAGNOSIS — K2971 Gastritis, unspecified, with bleeding: Secondary | ICD-10-CM | POA: Diagnosis not present

## 2015-05-30 DIAGNOSIS — Z8249 Family history of ischemic heart disease and other diseases of the circulatory system: Secondary | ICD-10-CM | POA: Diagnosis not present

## 2015-05-30 DIAGNOSIS — M810 Age-related osteoporosis without current pathological fracture: Secondary | ICD-10-CM | POA: Diagnosis present

## 2015-05-30 DIAGNOSIS — A419 Sepsis, unspecified organism: Secondary | ICD-10-CM | POA: Diagnosis present

## 2015-05-30 DIAGNOSIS — N3281 Overactive bladder: Secondary | ICD-10-CM

## 2015-05-30 DIAGNOSIS — K219 Gastro-esophageal reflux disease without esophagitis: Secondary | ICD-10-CM | POA: Diagnosis present

## 2015-05-30 DIAGNOSIS — M5134 Other intervertebral disc degeneration, thoracic region: Secondary | ICD-10-CM | POA: Diagnosis not present

## 2015-05-30 DIAGNOSIS — D62 Acute posthemorrhagic anemia: Secondary | ICD-10-CM | POA: Diagnosis not present

## 2015-05-30 DIAGNOSIS — D75839 Thrombocytosis, unspecified: Secondary | ICD-10-CM | POA: Diagnosis present

## 2015-05-30 DIAGNOSIS — J189 Pneumonia, unspecified organism: Secondary | ICD-10-CM | POA: Diagnosis not present

## 2015-05-30 DIAGNOSIS — M25529 Pain in unspecified elbow: Secondary | ICD-10-CM | POA: Diagnosis not present

## 2015-05-30 DIAGNOSIS — R269 Unspecified abnormalities of gait and mobility: Secondary | ICD-10-CM | POA: Diagnosis not present

## 2015-05-30 DIAGNOSIS — M5137 Other intervertebral disc degeneration, lumbosacral region: Secondary | ICD-10-CM | POA: Diagnosis not present

## 2015-05-30 DIAGNOSIS — R079 Chest pain, unspecified: Secondary | ICD-10-CM | POA: Diagnosis not present

## 2015-05-30 DIAGNOSIS — I10 Essential (primary) hypertension: Secondary | ICD-10-CM | POA: Diagnosis present

## 2015-05-30 DIAGNOSIS — J158 Pneumonia due to other specified bacteria: Secondary | ICD-10-CM | POA: Diagnosis not present

## 2015-05-30 DIAGNOSIS — R1312 Dysphagia, oropharyngeal phase: Secondary | ICD-10-CM | POA: Diagnosis not present

## 2015-05-30 DIAGNOSIS — E162 Hypoglycemia, unspecified: Secondary | ICD-10-CM | POA: Diagnosis not present

## 2015-05-30 DIAGNOSIS — E871 Hypo-osmolality and hyponatremia: Secondary | ICD-10-CM | POA: Diagnosis present

## 2015-05-30 DIAGNOSIS — Z8673 Personal history of transient ischemic attack (TIA), and cerebral infarction without residual deficits: Secondary | ICD-10-CM

## 2015-05-30 DIAGNOSIS — R296 Repeated falls: Secondary | ICD-10-CM | POA: Diagnosis not present

## 2015-05-30 DIAGNOSIS — D7589 Other specified diseases of blood and blood-forming organs: Secondary | ICD-10-CM | POA: Diagnosis present

## 2015-05-30 DIAGNOSIS — M546 Pain in thoracic spine: Secondary | ICD-10-CM | POA: Diagnosis not present

## 2015-05-30 DIAGNOSIS — M6281 Muscle weakness (generalized): Secondary | ICD-10-CM | POA: Diagnosis not present

## 2015-05-30 DIAGNOSIS — Z833 Family history of diabetes mellitus: Secondary | ICD-10-CM | POA: Diagnosis not present

## 2015-05-30 DIAGNOSIS — R071 Chest pain on breathing: Secondary | ICD-10-CM | POA: Diagnosis not present

## 2015-05-30 DIAGNOSIS — J188 Other pneumonia, unspecified organism: Secondary | ICD-10-CM | POA: Diagnosis not present

## 2015-05-30 DIAGNOSIS — R41841 Cognitive communication deficit: Secondary | ICD-10-CM | POA: Diagnosis not present

## 2015-05-30 DIAGNOSIS — R2681 Unsteadiness on feet: Secondary | ICD-10-CM | POA: Diagnosis not present

## 2015-05-30 DIAGNOSIS — W19XXXA Unspecified fall, initial encounter: Secondary | ICD-10-CM | POA: Diagnosis not present

## 2015-05-30 DIAGNOSIS — M4806 Spinal stenosis, lumbar region: Secondary | ICD-10-CM | POA: Diagnosis not present

## 2015-05-30 DIAGNOSIS — D473 Essential (hemorrhagic) thrombocythemia: Secondary | ICD-10-CM | POA: Diagnosis not present

## 2015-05-30 DIAGNOSIS — Z66 Do not resuscitate: Secondary | ICD-10-CM | POA: Diagnosis present

## 2015-05-30 DIAGNOSIS — H81399 Other peripheral vertigo, unspecified ear: Secondary | ICD-10-CM | POA: Diagnosis not present

## 2015-05-30 DIAGNOSIS — D649 Anemia, unspecified: Secondary | ICD-10-CM | POA: Diagnosis not present

## 2015-05-30 DIAGNOSIS — G459 Transient cerebral ischemic attack, unspecified: Secondary | ICD-10-CM | POA: Diagnosis not present

## 2015-05-30 DIAGNOSIS — J209 Acute bronchitis, unspecified: Secondary | ICD-10-CM | POA: Diagnosis not present

## 2015-05-30 DIAGNOSIS — R05 Cough: Secondary | ICD-10-CM | POA: Diagnosis not present

## 2015-05-30 DIAGNOSIS — J45998 Other asthma: Secondary | ICD-10-CM | POA: Diagnosis not present

## 2015-05-30 DIAGNOSIS — R0600 Dyspnea, unspecified: Secondary | ICD-10-CM | POA: Diagnosis not present

## 2015-05-30 DIAGNOSIS — M549 Dorsalgia, unspecified: Secondary | ICD-10-CM

## 2015-05-30 DIAGNOSIS — S82001D Unspecified fracture of right patella, subsequent encounter for closed fracture with routine healing: Secondary | ICD-10-CM | POA: Diagnosis not present

## 2015-05-30 LAB — CBC WITH DIFFERENTIAL/PLATELET
BASOS PCT: 0 %
Basophils Absolute: 0 10*3/uL (ref 0.0–0.1)
EOS PCT: 0 %
Eosinophils Absolute: 0 10*3/uL (ref 0.0–0.7)
HEMATOCRIT: 38.3 % (ref 36.0–46.0)
Hemoglobin: 12.9 g/dL (ref 12.0–15.0)
LYMPHS PCT: 7 %
Lymphs Abs: 1.1 10*3/uL (ref 0.7–4.0)
MCH: 31.5 pg (ref 26.0–34.0)
MCHC: 33.7 g/dL (ref 30.0–36.0)
MCV: 93.6 fL (ref 78.0–100.0)
Monocytes Absolute: 1.8 10*3/uL — ABNORMAL HIGH (ref 0.1–1.0)
Monocytes Relative: 12 %
NEUTROS PCT: 81 %
Neutro Abs: 12.4 10*3/uL — ABNORMAL HIGH (ref 1.7–7.7)
Platelets: 415 10*3/uL — ABNORMAL HIGH (ref 150–400)
RBC: 4.09 MIL/uL (ref 3.87–5.11)
RDW: 12.7 % (ref 11.5–15.5)
WBC: 15.3 10*3/uL — ABNORMAL HIGH (ref 4.0–10.5)

## 2015-05-30 LAB — COMPREHENSIVE METABOLIC PANEL
ALBUMIN: 3.6 g/dL (ref 3.5–5.0)
ALT: 28 U/L (ref 14–54)
ANION GAP: 9 (ref 5–15)
AST: 24 U/L (ref 15–41)
Alkaline Phosphatase: 69 U/L (ref 38–126)
BUN: 11 mg/dL (ref 6–20)
CHLORIDE: 101 mmol/L (ref 101–111)
CO2: 23 mmol/L (ref 22–32)
Calcium: 9.4 mg/dL (ref 8.9–10.3)
Creatinine, Ser: 0.65 mg/dL (ref 0.44–1.00)
GFR calc Af Amer: >60 mL/min (ref >60)
GFR calc non Af Amer: >60 mL/min (ref >60)
GLUCOSE: 128 mg/dL — AB (ref 65–99)
POTASSIUM: 3.7 mmol/L (ref 3.5–5.1)
Sodium: 133 mmol/L — ABNORMAL LOW (ref 135–145)
Total Bilirubin: 1.1 mg/dL (ref 0.3–1.2)
Total Protein: 7.4 g/dL (ref 6.5–8.1)

## 2015-05-30 LAB — I-STAT CG4 LACTIC ACID, ED: LACTIC ACID, VENOUS: 1.09 mmol/L (ref 0.5–2.0)

## 2015-05-30 MED ORDER — IPRATROPIUM-ALBUTEROL 0.5-2.5 (3) MG/3ML IN SOLN
3.0000 mL | Freq: Once | RESPIRATORY_TRACT | Status: AC
  Administered 2015-05-30: 3 mL via RESPIRATORY_TRACT
  Filled 2015-05-30: qty 3

## 2015-05-30 MED ORDER — IOHEXOL 350 MG/ML SOLN
100.0000 mL | Freq: Once | INTRAVENOUS | Status: AC | PRN
  Administered 2015-05-30: 100 mL via INTRAVENOUS

## 2015-05-30 MED ORDER — SODIUM CHLORIDE 0.9 % IV BOLUS (SEPSIS)
1000.0000 mL | Freq: Once | INTRAVENOUS | Status: AC
Start: 2015-05-30 — End: 2015-05-30
  Administered 2015-05-30: 1000 mL via INTRAVENOUS

## 2015-05-30 MED ORDER — DEXTROSE 5 % IV SOLN
500.0000 mg | Freq: Once | INTRAVENOUS | Status: AC
  Administered 2015-05-30: 500 mg via INTRAVENOUS
  Filled 2015-05-30: qty 500

## 2015-05-30 MED ORDER — DEXTROSE 5 % IV SOLN
1.0000 g | Freq: Once | INTRAVENOUS | Status: AC
  Administered 2015-05-30: 1 g via INTRAVENOUS
  Filled 2015-05-30: qty 10

## 2015-05-30 NOTE — Progress Notes (Signed)
Patient ID: Brandy Crawford, female   DOB: 06-04-1923, 82 y.o.   MRN: [id number]  Location:  [facility]  [facility] Provider:  Marlana Latus NP  Code Status:  DNR Goals of care: Advanced Directive information Does patient have an advance directive?: Yes, Type of Advance Directive: [place];Out of facility DNR (pink MOST or yellow form)  Chief Complaint  Patient presents with  . Acute Visit    unstable Temp, cough     HPI: Patient is a 82 y.o. female seen in the [place] at [facility] today for evaluation of the right upper back pain today, noted SOB, but no O2 desaturation, worsened with deep breath, pain is constant, dull pain which is migratory, c/o chest pain which was relieved after Mylanta 30ccx1 05/29/15, c/o neck pain 05/28/15, she had X-ray Cervical spine showed no compression deformities,  denied palpitation, a week ago, she was seen in clinic [facility] for  left lower back, hip, thigh pain x 1 week, positional and with weight bearing, not waking  her night sleep. Denied fall or trauma. She is afebrile. She compltedompleted Avelox for acute bronchitis 3 weeks ago. Hx of HTN, controlled, on Amlodipine, Losartan, anemia on Fe,  urinary frequency, taking Myrbetriq, 3-4x/night bathroom trips, no difficulty return to sleep, GERD on Omeprazole.   Review of Systems  Constitutional: Negative for fever, chills and diaphoresis.  HENT: Positive for hearing loss. Negative for congestion, ear discharge, ear pain, nosebleeds, sore throat and tinnitus.   Eyes: Negative for photophobia, pain, discharge and redness.  Respiratory: Positive for cough. Negative for sputum production, shortness of breath, wheezing and stridor.        Resolved congestive cough, yellow phlegm   Cardiovascular: Negative for chest pain, palpitations and leg swelling.  Gastrointestinal: Negative for nausea, vomiting, abdominal pain, diarrhea (Under control with Questran and Imodium), constipation and blood in stool.    Genitourinary: Positive for frequency. Negative for dysuria, urgency, hematuria and flank pain.       1-2x/night. Stress incontinence  Musculoskeletal: Positive for back pain. Negative for myalgias and neck pain.       Left lower back, left hip, left thigh pain with weight bearing and position changes-resolved Chest pain was relieved after Mylanta 05/28/15 Neck pain 05/29/15 Right upper back pin 05/30/15  Skin: Negative.  Negative for rash.  Neurological: Negative for dizziness, tremors, seizures, weakness and headaches.       Memory loss. Patient had delirium when septic.  Endo/Heme/Allergies: Negative for environmental allergies and polydipsia. Does not bruise/bleed easily.       Anemic secondary to acute blood loss from lower GI bleeding  Psychiatric/Behavioral: Negative for suicidal ideas and hallucinations. The patient is not nervous/anxious.     Past Medical History  Diagnosis Date  . Asthma   . GERD (gastroesophageal reflux disease)   . Hypertension   . TIA (transient ischemic attack) 09/20/2006  . Cervical disc disease   . Anemia   . Depression   . Lumbosacral spondylosis 10/28/2009  . Abnormality of gait 04/28/2010    History of fall onto right shoulder-has participated in PT. Uses walker.     . Acute lower GI bleeding 10/30/2014  . Fall at nursing home 10/25/2014  . History of cardiovascular disorder 09/20/2006    X 3 in 2000 now on aspirin.    Marland Kitchen Hyperlipidemia 09/20/2006    05/15/14 LDL 95   . OSTEOPOROSIS 09/20/2006    Noted 2008    . Overactive bladder 11/29/2013  Myrbetriq prescribed by urology.    . SCOLIOSIS 10/28/2009  . Diarrhea 10/25/14  . Acute encephalopathy 10/25/14    Occurred when septic  . Diverticulosis 10/27/2014  . Cerebrovascular disease 10/27/2014    Small vessel disease  . Cerebral atrophy 10/27/2014  . Aortic atherosclerosis ([place]) 10/27/2014  . Degenerative disc disease, lumbar 10/27/2014  . Scoliosis 10/27/2014    Patient Active Problem List    Diagnosis Date Noted  . Upper back pain on right side 05/30/2015  . Acute bronchitis 05/09/2015  . Acute lower GI bleeding 10/30/2014  . E-coli UTI 10/30/2014  . Acute blood loss anemia 10/30/2014  . Fall at nursing home 10/25/2014  . Diarrhea 10/19/2014  . Overactive bladder 11/29/2013  . Abnormality of gait 04/28/2010  . DDD (degenerative disc disease), lumbosacral 10/28/2009  . SCOLIOSIS 10/28/2009  . Hyperlipidemia 09/20/2006  . Essential hypertension 09/20/2006  . Asthma 09/20/2006  . GERD 09/20/2006  . OSTEOPOROSIS 09/20/2006  . History of cardiovascular disorder 09/20/2006    Allergies  Allergen Reactions  . Tramadol Other (See Comments)    Hallucinations, agitation/combativeness  . Amoxicillin     Per MAR  . Oxycodone Other (See Comments)    Per MAR   . Sulfamethoxazole     Per MAR   . Penicillins Rash    Medications: Patient's Medications  New Prescriptions   No medications on file  Previous Medications   ALBUTEROL (PROVENTIL HFA;VENTOLIN HFA) 108 (90 BASE) MCG/ACT INHALER    Inhale 2 puffs into the lungs every 6 (six) hours as needed for wheezing or shortness of breath.   AMLODIPINE (NORVASC) 5 MG TABLET    Take 5 mg by mouth daily with breakfast.    CHOLESTYRAMINE (QUESTRAN) 4 G PACKET    Take 4 g by mouth 2 (two) times daily.   FERROUS SULFATE 325 (65 FE) MG TABLET    Take 325 mg by mouth daily.   LOPERAMIDE (IMODIUM) 2 MG CAPSULE    Take one to two tablets by mouth as needed for diarrhea   LOSARTAN (COZAAR) 100 MG TABLET    Take 100 mg by mouth daily with breakfast.    MIRABEGRON ER (MYRBETRIQ) 25 MG TB24    Take 25 mg by mouth daily with breakfast. Prescribed by urology   OMEPRAZOLE (PRILOSEC) 20 MG CAPSULE    Take 20 mg by mouth daily.   PYRIDOXINE (VITAMIN B-6) 100 MG TABLET    Take 100 mg by mouth daily with breakfast.    TRAMADOL (ULTRAM) 50 MG TABLET    Take 50 mg by mouth every 6 (six) hours as needed for moderate pain or severe pain.  Modified  Medications   No medications on file  Discontinued Medications   HYDROCODONE-ACETAMINOPHEN (NORCO) 5-325 MG TABLET    Take 1/2 tablet by mouth three times daily for pain    Physical Exam: Filed Vitals:   05/30/15 0952  BP: 126/75  Pulse: 70  Temp: 98 F (36.7 C)  TempSrc: Oral  Resp: 14   There is no weight on file to calculate BMI.  Physical Exam  Constitutional: She is oriented to person, place, and time. She appears well-developed and well-nourished. No distress.  HENT:  Head: Normocephalic and atraumatic.  Right Ear: No lacerations. No drainage. No foreign bodies. No mastoid tenderness. Tympanic membrane is not injected, not scarred, not perforated, not erythematous, not retracted and not bulging. Tympanic membrane mobility is abnormal. No middle ear effusion. Decreased hearing is noted.  Left Ear: No  lacerations. No drainage. No foreign bodies. No mastoid tenderness. Tympanic membrane is not injected, not scarred, not perforated, not erythematous, not retracted and not bulging. Tympanic membrane mobility is abnormal.  No middle ear effusion. Decreased hearing is noted.  Nose: Nose normal.  Mouth/Throat: Oropharynx is clear and moist. No oropharyngeal exudate.  Eyes: Conjunctivae and EOM are normal. Pupils are equal, round, and reactive to light. Right eye exhibits no discharge. Left eye exhibits no discharge. No scleral icterus.  Neck: Normal range of motion. Neck supple. No JVD present. No tracheal deviation present. No thyromegaly present.  Cardiovascular: Normal rate, regular rhythm, normal heart sounds and intact distal pulses.  Exam reveals no gallop and no friction rub.   No murmur heard. Pulmonary/Chest: Effort normal. No stridor. No respiratory distress. She has no wheezes. She has no rales. She exhibits no tenderness.  Coarse rales posterior mid to lower lungs.  Abdominal: Soft. Bowel sounds are normal. She exhibits no distension and no mass. There is no tenderness.  There is no rebound and no guarding.  Genitourinary: Guaiac negative stool.  Musculoskeletal: She exhibits no edema or tenderness.   Left lower back, left hip, left thigh pain with weight bearing and position changes-resolved Chest pain was relieved after Mylanta 05/28/15 Neck pain 05/29/15 Right upper back pin 05/30/15  Lymphadenopathy:    She has no cervical adenopathy.  Neurological: She is alert and oriented to person, place, and time. She has normal reflexes. No cranial nerve deficit. She exhibits normal muscle tone. Coordination normal.  Skin: Skin is warm and dry. No rash noted. She is not diaphoretic. No erythema. No pallor.  Lots of moles.   Psychiatric: She has a normal mood and affect. Her behavior is normal. Judgment and thought content normal.    Labs reviewed: Basic Metabolic Panel:  Recent Labs  10/25/14 0714 10/26/14 0522 10/29/14 1920 05/14/15  NA 134* 140 139 140  K 4.6 3.7 3.9 4.0  CL 105 110 108  --   CO2 23 24 26   --   GLUCOSE 164* 100* 167*  --   BUN 28* 13 <5* 13  CREATININE 0.67 0.66 0.58 0.6  CALCIUM 9.5 9.0 9.1  --     Liver Function Tests:  Recent Labs  10/23/14 10/25/14 0714 05/14/15  AST 15 23 16   ALT 16 22 15   ALKPHOS 58 55 59  BILITOT  --  0.9  --   PROT  --  6.4*  --   ALBUMIN  --  3.7  --     CBC:  Recent Labs  10/25/14 0714  10/28/14 1059  10/29/14 1920 10/30/14 0808 05/14/15  WBC 20.2*  < > 9.9  --  8.0 6.8 6.7  NEUTROABS 19.1*  --   --   --   --   --   --   HGB 11.0*  < > 9.7*  < > 9.5* 9.5* 13.2  HCT 32.7*  < > 29.2*  < > 28.6* 28.7* 39  MCV 92.1  < > 96.1  --  96.9 94.7  --   PLT 333  < > 378  --  419* 393 322  < > = values in this interval not displayed.  Lab Results  Component Value Date   TSH 2.170 10/29/2014   Lab Results  Component Value Date   HGBA1C 5.9 08/24/2011   Lab Results  Component Value Date   CHOL 176 05/15/2014   HDL 62 05/15/2014   [place] 95 05/15/2014  LDLDIRECT 114.3 12/08/2007   TRIG  93 05/15/2014   CHOLHDL 2.9 CALC 12/08/2007    Significant Diagnostic Results since last visit: none  Patient Care Team: Estill Dooms, MD as PCP - General (Internal Medicine) Man [name] X, NP as Nurse Practitioner (Nurse Practitioner)  Assessment/Plan Problem List Items Addressed This Visit    Essential hypertension - Primary (Chronic)    Controlled, continue Amlodipine 5mg , Losartan 100mg ,      GERD (Chronic)    Complained chest pain 05/29/15, relived after Mylanta 30ccx1, continue Omeprazole       DDD (degenerative disc disease), lumbosacral (Chronic)    Including lumbar spondylosis. Regular f/u Dr. French Ana. Receives injections last 01/2013.  05/27/15 Xray left hip/pelvis: spondylotic changes, severe osteoarthritis.        Relevant Medications   traMADol (ULTRAM) 50 MG tablet   Overactive bladder (Chronic)    Saw Urology 06/06/14-continued with Myrbetriq.  Stress incontinent in natures. 3-4x/night      Acute blood loss anemia (Chronic)    05/14/15 Hgb 13.2, continue Fe      Acute bronchitis    Congestive cough, yellow phlegm resolved, completed Avelox 400mg  daily x 7 days, Mucinex 600mg  bid x 3 days, CXR ruled out PNA 05/09/15 CXR no radiographic evidence of acute cardiopulmonary disease, cardiac size mildly prominent, mild degree of osteoporosis, mild osteoarthritis.         Upper back pain on right side    In the right scapular area, PE vs MI vs muscle spasm? ED eval.       Relevant Medications   traMADol (ULTRAM) 50 MG tablet       Family/ staff Communication: ED to eval.   Labs/tests ordered: none  [facility] [name] NP Geriatrics [facility] [address], [place] [address] On Call:  [phone] & follow prompts after 5pm & weekends Office Phone:  [phone] Office Fax:  [phone]

## 2015-05-30 NOTE — Progress Notes (Signed)
Patient ID: Brandy Crawford, female   DOB: May 25, 1923, 82 y.o.   MRN: [id number] Subjective: Pt is a resident of [facility] where she was recently treated for CAP with Avelox. The patient herself is a poor historian and states that she came to the ED because of neck pain. The pain is actually localized ot her right scapular region.   I spoke with both the NP from [facility] and her daughter. The history I received from the NP is that she was fully treated for CAP more than 1 week ago and started complaining of pain in the low back and then neck. She had x-rays of both low back and neck which were negative for anything acute. She then started to c/o pain in the right scapular region and was sent over to the ED to be evaluated for the possibility of a PE. Her daughter- HCPOA Delfin Gant [phone]) prefers that the patient not be admitted if at all possible as she states that she does not do well in the hospital.  Her daughter also reports that she had a baseline SOB and has a questionable h/o asthma (She was started on albuterol but not sure if it was just a trial or if a diagnosis was actually made), and intermittently requires oxygen. She also reports that the patient has had the pain in the right scapular region for several years so this is not a new symptom.   In the ED she was found to have a WBC of 15 increased from 13 2 weeks ago. She has been afebrile and CXR is negative for an acute process. She is awaiting a CT angiogram to evaluate for PE.   I have discussed all of the above with the ED Physician Dr. Laneta Simmers and he will pass this on to Dr. Kathrynn Humble who will recall Hospitalist if admission warranted. I have also made SW aware of possible need for skilled placement upon return to [facility].

## 2015-05-30 NOTE — Clinical Social Work Note (Signed)
Clinical Social Work Assessment  Patient Details  Name: Brandy Crawford MRN: [id number] Date of Birth: 1923/06/19  Date of referral:  05/30/15               Reason for consult:   (Patient is from [place] sought to share information with:   (None.) Permission granted to share information::  No  Name::        Agency::     Relationship::     Contact Information:     Housing/Transportation Living arrangements for the past 2 months:  [place] of Information:  Patient, Adult Children Patient Interpreter Needed:  None Criminal Activity/Legal Involvement Pertinent to Current Situation/Hospitalization:  No - Comment as needed Significant Relationships:  Adult Children (Patient's daughter is a primary support. She is also a POA for patient. Brandy Crawford [phone]) Lives with:    Do you feel safe going back to the place where you live?  Yes Need for family participation in patient care:  Yes (Comment)  Care giving concerns:  Physician consulted CSW stating that the patient may be interested in a higher level of care. CSW spoke with patient and patient's daughter via phone. Both state that the believe patient is at higher level of care. CSW also reached out to patient's facility social worker/ Leigh. However, she did not answer the phone. CSW left a message asking for a call back.    Social Worker assessment / plan:  CSW met with patient at bedside. Patient presents to [facility] due to productive cough and back pain. Patient confirms that she is from [facility], and states that she is in the assisted living unit. Patient states that staff assist her with completing her ADL's. Patient informed CSW that she uses a walker to ambulate.  Patient informed CSW that she feels as though she has good support, which consist of her daughter/Brandy Crawford.  Patient informed CSW that she would feel safe to return to facility.  Employment status:   Retired Forensic scientist:  Medicare PT Recommendations:    Information / Referral to community resources:   (CSW spoke with patient and daughter about possible higher level of care. However,both feel that she is at appropriate level at this time.)  Patient/Family's Response to care:  Patient is appropriate at this time.  Patient/Family's Understanding of and Emotional Response to Diagnosis, Current Treatment, and Prognosis:  Patient has no questions for CSW.  Emotional Assessment Appearance:  Appears stated age Attitude/Demeanor/Rapport:   (Appropriate.) Affect (typically observed):  Accepting, Appropriate Orientation:  Oriented to Self, Oriented to Place, Oriented to  Time, Oriented to Situation Alcohol / Substance use:  Not Applicable Psych involvement (Current and /or in the community):  No (Comment)  Discharge Needs  Concerns to be addressed:  Adjustment to Illness Readmission within the last 30 days:  No Current discharge risk:  None Barriers to Discharge:  No Barriers Identified   Bernita Buffy, LCSW 05/30/2015, 8:31 PM

## 2015-05-30 NOTE — ED Notes (Signed)
Pt to ct 

## 2015-05-30 NOTE — Assessment & Plan Note (Signed)
05/14/15 Hgb 13.2, continue Fe

## 2015-05-30 NOTE — Assessment & Plan Note (Signed)
Saw Urology 06/06/14-continued with Myrbetriq.  Stress incontinent in natures. 3-4x/night

## 2015-05-30 NOTE — Assessment & Plan Note (Signed)
Congestive cough, yellow phlegm resolved, completed Avelox 400mg  daily x 7 days, Mucinex 600mg  bid x 3 days, CXR ruled out PNA 05/09/15 CXR no radiographic evidence of acute cardiopulmonary disease, cardiac size mildly prominent, mild degree of osteoporosis, mild osteoarthritis.

## 2015-05-30 NOTE — Assessment & Plan Note (Signed)
In the right scapular area, PE vs MI vs muscle spasm? ED eval.

## 2015-05-30 NOTE — Assessment & Plan Note (Signed)
Complained chest pain 05/29/15, relived after Mylanta 30ccx1, continue Omeprazole

## 2015-05-30 NOTE — ED Notes (Signed)
Unable to get 2nd set of blood cultures. 

## 2015-05-30 NOTE — Assessment & Plan Note (Signed)
Controlled, continue Amlodipine 5mg , Losartan 100mg ,

## 2015-05-30 NOTE — ED Notes (Signed)
Pt's daughter can be reached at [phone] Izora Gala)

## 2015-05-30 NOTE — ED Notes (Signed)
Per EMS pt from [facility] with c/o productive cough and back pain. Fever of 101. Given aspirin at facility. O2 92% RA. IV 20G LFA.

## 2015-05-30 NOTE — ED Provider Notes (Signed)
CSN: [id number]     Arrival date & time 05/30/15  1128 History   First MD Initiated Contact with Patient 05/30/15 1143     Chief Complaint  Patient presents with  . Cough     (Consider location/radiation/quality/duration/timing/severity/associated sxs/prior Treatment) Patient is a 82 y.o. female presenting with cough. The history is provided by the patient.  Cough Cough characteristics:  Productive Sputum characteristics:  Yellow Severity:  Moderate Onset quality:  Gradual Duration:  1 week Timing:  Constant Progression:  Unchanged Chronicity:  New Smoker: no   Context: upper respiratory infection   Relieved by:  Nothing Worsened by:  Nothing tried Ineffective treatments: moxifloxacin. Associated symptoms: chest pain (with cough), chills, fever (pta) and shortness of breath     Past Medical History  Diagnosis Date  . Asthma   . GERD (gastroesophageal reflux disease)   . Hypertension   . TIA (transient ischemic attack) 09/20/2006  . Cervical disc disease   . Anemia   . Depression   . Lumbosacral spondylosis 10/28/2009  . Abnormality of gait 04/28/2010    History of fall onto right shoulder-has participated in PT. Uses walker.     . Acute lower GI bleeding 10/30/2014  . Fall at nursing home 10/25/2014  . History of cardiovascular disorder 09/20/2006    X 3 in 2000 now on aspirin.    Marland Kitchen Hyperlipidemia 09/20/2006    05/15/14 LDL 95   . OSTEOPOROSIS 09/20/2006    Noted 2008    . Overactive bladder 11/29/2013    Myrbetriq prescribed by urology.    . SCOLIOSIS 10/28/2009  . Diarrhea 10/25/14  . Acute encephalopathy 10/25/14    Occurred when septic  . Diverticulosis 10/27/2014  . Cerebrovascular disease 10/27/2014    Small vessel disease  . Cerebral atrophy 10/27/2014  . Aortic atherosclerosis ([place]) 10/27/2014  . Degenerative disc disease, lumbar 10/27/2014  . Scoliosis 10/27/2014   Past Surgical History  Procedure Laterality Date  . Appendectomy    . Abdominal hysterectomy     . Tonsillectomy    . Cholecystectomy     Family History  Problem Relation Age of Onset  . Pancreatic cancer Father 59    deceased  . Heart failure Mother 104    deceased  . Leukemia Brother 93    deceased  . Diabetes type II    . Alcohol abuse     Social History  Substance Use Topics  . Smoking status: Never Smoker   . Smokeless tobacco: Never Used  . Alcohol Use: No   OB History    No data available     Review of Systems  Constitutional: Positive for fever (pta) and chills.  Respiratory: Positive for cough and shortness of breath.   Cardiovascular: Positive for chest pain (with cough).  All other systems reviewed and are negative.     Allergies  Tramadol; Amoxicillin; Oxycodone; Sulfamethoxazole; and Penicillins  Home Medications   Prior to Admission medications   Medication Sig Start Date End Date Taking? Authorizing Provider  acetaminophen (TYLENOL) 325 MG tablet Take 650 mg by mouth every 4 (four) hours as needed for moderate pain or headache.   Yes Historical Provider, MD  albuterol (PROVENTIL HFA;VENTOLIN HFA) 108 (90 BASE) MCG/ACT inhaler Inhale 2 puffs into the lungs every 6 (six) hours as needed for wheezing or shortness of breath. 02/19/14  Yes Marin Olp, MD  alum & mag hydroxide-simeth (MAALOX/MYLANTA) 200-200-20 MG/5ML suspension Take 30 mLs by mouth as needed for indigestion or heartburn.  Yes Historical Provider, MD  amLODipine (NORVASC) 5 MG tablet Take 5 mg by mouth daily with breakfast.    Yes Historical Provider, MD  cholestyramine (QUESTRAN) 4 G packet Take 4 g by mouth 2 (two) times daily.   Yes Historical Provider, MD  ferrous sulfate 325 (65 FE) MG tablet Take 325 mg by mouth daily.   Yes Historical Provider, MD  loperamide (IMODIUM) 2 MG capsule Take one to two tablets by mouth as needed for diarrhea   Yes Historical Provider, MD  losartan (COZAAR) 100 MG tablet Take 100 mg by mouth daily with breakfast.    Yes Historical Provider, MD   Mirabegron ER (MYRBETRIQ) 25 MG TB24 Take 25 mg by mouth daily with breakfast. Prescribed by urology   Yes Historical Provider, MD  nitroGLYCERIN (NITROSTAT) 0.4 MG SL tablet Place 0.4 mg under the tongue every 5 (five) minutes as needed for chest pain.   Yes Historical Provider, MD  omeprazole (PRILOSEC) 20 MG capsule Take 20 mg by mouth daily.   Yes Historical Provider, MD  pyridOXINE (VITAMIN B-6) 100 MG tablet Take 100 mg by mouth daily with breakfast.    Yes Historical Provider, MD  traMADol (ULTRAM) 50 MG tablet Take 50 mg by mouth every 6 (six) hours as needed for moderate pain or severe pain.   Yes Historical Provider, MD   BP 140/71 mmHg  Pulse 102  Temp(Src) 98.9 F (37.2 C) (Oral)  Resp 21  SpO2 95% Physical Exam  Constitutional: She is oriented to person, place, and time. She appears well-developed and well-nourished. No distress.  HENT:  Head: Normocephalic.  Eyes: Conjunctivae are normal.  Neck: Neck supple. Muscular tenderness (on right) present. No tracheal deviation present.  Cardiovascular: Normal rate and regular rhythm.   Pulmonary/Chest: No stridor. Tachypnea noted. No respiratory distress. She has rhonchi (scattered). She has rales (basilar).  Abdominal: Soft. She exhibits no distension. There is no tenderness.  Neurological: She is alert and oriented to person, place, and time.  Skin: Skin is warm and dry.  Psychiatric: She has a normal mood and affect.    ED Course  Procedures (including critical care time) Labs Review Labs Reviewed  CBC WITH DIFFERENTIAL/PLATELET - Abnormal; Notable for the following:    WBC 15.3 (*)    Platelets 415 (*)    Neutro Abs 12.4 (*)    Monocytes Absolute 1.8 (*)    All other components within normal limits  COMPREHENSIVE METABOLIC PANEL - Abnormal; Notable for the following:    Sodium 133 (*)    Glucose, Bld 128 (*)    All other components within normal limits  CULTURE, BLOOD (ROUTINE X 2)  CULTURE, BLOOD (ROUTINE X 2)   I-STAT CG4 LACTIC ACID, ED    Imaging Review Dg Chest 2 View  05/30/2015  CLINICAL DATA:  Productive cough and chest pain EXAM: CHEST  2 VIEW COMPARISON:  10/25/2014 FINDINGS: Cardiac shadow is at the upper limits of normal in size but stable. Aortic calcifications are again seen. The lungs are well aerated bilaterally. No focal infiltrate or sizable effusion is seen. Degenerative changes of thoracic spine are noted. IMPRESSION: No acute abnormality noted. Electronically Signed   By: Inez Catalina M.D.   On: 05/30/2015 13:00   I have personally reviewed and evaluated these images and lab results as part of my medical decision-making.   EKG Interpretation None      MDM   Final diagnoses:  CAP (community acquired pneumonia)   82 y.o.  female presents with productive cough, neck pain, fever yesterday to 101. Received NSAIDs prior to departing facility. On arrival here she has some inducible tachycardia, new onset hypoxemia requiring 2 L of oxygen. She does have some rhonchorous lung sounds bilaterally concerning for developing infection. Chest x-ray noted as negative by radiology but I see increased opacification of the right lung base consistent with her clinical symptoms of community-acquired pneumonia. No elevation of lactic acid or signs of end organ damage to suggest severe sepsis.  The patient was covered with Rocephin and azithromycin for community-acquired pneumonia with outpatient failure on moxifloxacin which she recently completed a 7 day course of. Hospitalist was consulted for admission and will see the patient in the emergency department. After discussion with hospitalist consideration was given to PE with new oxygen requirement and CT angiography chest was ordered for rule out. Family and facility reached out to hospitalist regarding possible return to skilled nursing area of facility for further management but this would be AMA as she has experienced a clinical deterioration with  outpatient efforts to this point and has signs of worsening developing infection.   Leo Grosser, MD 05/30/15 [phone]

## 2015-05-30 NOTE — Assessment & Plan Note (Signed)
Including lumbar spondylosis. Regular f/u Dr. French Ana. Receives injections last 01/2013.  05/27/15 Xray left hip/pelvis: spondylotic changes, severe osteoarthritis.

## 2015-05-31 ENCOUNTER — Encounter (HOSPITAL_COMMUNITY): Payer: Self-pay

## 2015-05-31 DIAGNOSIS — J189 Pneumonia, unspecified organism: Secondary | ICD-10-CM

## 2015-05-31 DIAGNOSIS — D473 Essential (hemorrhagic) thrombocythemia: Secondary | ICD-10-CM | POA: Diagnosis present

## 2015-05-31 DIAGNOSIS — D75839 Thrombocytosis, unspecified: Secondary | ICD-10-CM | POA: Diagnosis present

## 2015-05-31 DIAGNOSIS — I10 Essential (primary) hypertension: Secondary | ICD-10-CM

## 2015-05-31 DIAGNOSIS — E871 Hypo-osmolality and hyponatremia: Secondary | ICD-10-CM

## 2015-05-31 DIAGNOSIS — A419 Sepsis, unspecified organism: Principal | ICD-10-CM

## 2015-05-31 HISTORY — DX: Thrombocytosis, unspecified: D75.839

## 2015-05-31 LAB — URINE MICROSCOPIC-ADD ON: Bacteria, UA: NONE SEEN

## 2015-05-31 LAB — CREATININE, SERUM: Creatinine, Ser: 0.62 mg/dL (ref 0.44–1.00)

## 2015-05-31 LAB — CBC
HEMATOCRIT: 33.4 % — AB (ref 36.0–46.0)
HEMOGLOBIN: 11.7 g/dL — AB (ref 12.0–15.0)
MCH: 32.3 pg (ref 26.0–34.0)
MCHC: 35 g/dL (ref 30.0–36.0)
MCV: 92.3 fL (ref 78.0–100.0)
Platelets: 377 10*3/uL (ref 150–400)
RBC: 3.62 MIL/uL — AB (ref 3.87–5.11)
RDW: 12.4 % (ref 11.5–15.5)
WBC: 12.1 10*3/uL — AB (ref 4.0–10.5)

## 2015-05-31 LAB — URINALYSIS, ROUTINE W REFLEX MICROSCOPIC
Bilirubin Urine: NEGATIVE
GLUCOSE, UA: NEGATIVE mg/dL
KETONES UR: 15 mg/dL — AB
LEUKOCYTES UA: NEGATIVE
NITRITE: NEGATIVE
PROTEIN: NEGATIVE mg/dL
Specific Gravity, Urine: 1.018 (ref 1.005–1.030)
pH: 6 (ref 5.0–8.0)

## 2015-05-31 LAB — MRSA PCR SCREENING: MRSA BY PCR: NEGATIVE

## 2015-05-31 MED ORDER — NITROGLYCERIN 0.4 MG SL SUBL: 0.4000 mg | SUBLINGUAL_TABLET | SUBLINGUAL | Status: DC | PRN

## 2015-05-31 MED ORDER — MIRABEGRON ER 25 MG PO TB24
25.0000 mg | ORAL_TABLET | Freq: Every day | ORAL | Status: DC
  Administered 2015-05-31 – 2015-06-03 (×4): 25 mg via ORAL
  Filled 2015-05-31 (×6): qty 1

## 2015-05-31 MED ORDER — SODIUM CHLORIDE 0.9% FLUSH
3.0000 mL | Freq: Two times a day (BID) | INTRAVENOUS | Status: DC
  Administered 2015-05-31 – 2015-06-02 (×4): 3 mL via INTRAVENOUS

## 2015-05-31 MED ORDER — ALBUTEROL SULFATE (2.5 MG/3ML) 0.083% IN NEBU: 2.5000 mg | INHALATION_SOLUTION | Freq: Four times a day (QID) | RESPIRATORY_TRACT | Status: DC | PRN

## 2015-05-31 MED ORDER — ONDANSETRON HCL 4 MG PO TABS: 4.0000 mg | ORAL_TABLET | Freq: Four times a day (QID) | ORAL | Status: DC | PRN

## 2015-05-31 MED ORDER — LOSARTAN POTASSIUM 50 MG PO TABS
100.0000 mg | ORAL_TABLET | Freq: Every day | ORAL | Status: DC
  Administered 2015-05-31 – 2015-06-03 (×4): 100 mg via ORAL
  Filled 2015-05-31 (×6): qty 2

## 2015-05-31 MED ORDER — AMLODIPINE BESYLATE 5 MG PO TABS
5.0000 mg | ORAL_TABLET | Freq: Every day | ORAL | Status: DC
  Administered 2015-05-31 – 2015-06-03 (×4): 5 mg via ORAL
  Filled 2015-05-31 (×7): qty 1

## 2015-05-31 MED ORDER — DEXTROSE 5 % IV SOLN
2.0000 g | INTRAVENOUS | Status: DC
  Administered 2015-05-31 – 2015-06-02 (×3): 2 g via INTRAVENOUS
  Filled 2015-05-31 (×5): qty 2

## 2015-05-31 MED ORDER — DEXTROSE 5 % IV SOLN
500.0000 mg | Freq: Every day | INTRAVENOUS | Status: DC
  Filled 2015-05-31: qty 500

## 2015-05-31 MED ORDER — ALUM & MAG HYDROXIDE-SIMETH 200-200-20 MG/5ML PO SUSP: 30.0000 mL | ORAL | Status: DC | PRN

## 2015-05-31 MED ORDER — ONDANSETRON HCL 4 MG/2ML IJ SOLN: 4.0000 mg | Freq: Four times a day (QID) | INTRAMUSCULAR | Status: DC | PRN

## 2015-05-31 MED ORDER — FERROUS SULFATE 325 (65 FE) MG PO TABS
325.0000 mg | ORAL_TABLET | Freq: Every day | ORAL | Status: DC
  Administered 2015-05-31 – 2015-06-03 (×4): 325 mg via ORAL
  Filled 2015-05-31 (×5): qty 1

## 2015-05-31 MED ORDER — ACETAMINOPHEN 325 MG PO TABS
650.0000 mg | ORAL_TABLET | Freq: Four times a day (QID) | ORAL | Status: DC | PRN
  Administered 2015-05-31 – 2015-06-02 (×2): 650 mg via ORAL
  Filled 2015-05-31 (×2): qty 2

## 2015-05-31 MED ORDER — ACETAMINOPHEN 325 MG PO TABS: 650.0000 mg | ORAL_TABLET | ORAL | Status: DC | PRN

## 2015-05-31 MED ORDER — VITAMIN B-6 100 MG PO TABS
100.0000 mg | ORAL_TABLET | Freq: Every day | ORAL | Status: DC
  Administered 2015-05-31 – 2015-06-03 (×4): 100 mg via ORAL
  Filled 2015-05-31 (×6): qty 1

## 2015-05-31 MED ORDER — CHOLESTYRAMINE 4 G PO PACK
4.0000 g | PACK | Freq: Two times a day (BID) | ORAL | Status: DC
  Administered 2015-05-31 – 2015-06-02 (×5): 4 g via ORAL
  Filled 2015-05-31 (×9): qty 1

## 2015-05-31 MED ORDER — ENOXAPARIN SODIUM 40 MG/0.4ML ~~LOC~~ SOLN
40.0000 mg | Freq: Every day | SUBCUTANEOUS | Status: DC
  Administered 2015-05-31 – 2015-06-02 (×3): 40 mg via SUBCUTANEOUS
  Filled 2015-05-31 (×4): qty 0.4

## 2015-05-31 MED ORDER — ALBUTEROL SULFATE (2.5 MG/3ML) 0.083% IN NEBU: 2.5000 mg | INHALATION_SOLUTION | RESPIRATORY_TRACT | Status: DC | PRN

## 2015-05-31 MED ORDER — PANTOPRAZOLE SODIUM 40 MG PO TBEC
40.0000 mg | DELAYED_RELEASE_TABLET | Freq: Every day | ORAL | Status: DC
  Administered 2015-05-31 – 2015-06-03 (×4): 40 mg via ORAL
  Filled 2015-05-31 (×5): qty 1

## 2015-05-31 MED ORDER — TRAMADOL HCL 50 MG PO TABS
50.0000 mg | ORAL_TABLET | Freq: Four times a day (QID) | ORAL | Status: DC | PRN
  Administered 2015-05-31 – 2015-06-03 (×7): 50 mg via ORAL
  Filled 2015-05-31 (×7): qty 1

## 2015-05-31 MED ORDER — DEXTROSE 5 % IV SOLN
500.0000 mg | INTRAVENOUS | Status: DC
  Administered 2015-05-31 – 2015-06-02 (×3): 500 mg via INTRAVENOUS
  Filled 2015-05-31 (×4): qty 500

## 2015-05-31 MED ORDER — HYDROCODONE-ACETAMINOPHEN 5-325 MG PO TABS: 1.0000 | ORAL_TABLET | ORAL | Status: DC | PRN

## 2015-05-31 NOTE — Care Management Important Message (Signed)
Important Message  Patient Details  Name: Brandy Crawford MRN: [id number] Date of Birth: Jan 25, 1924   Medicare Important Message Given:  Yes    Camillo Flaming 05/31/2015, 12:47 [place] Message  Patient Details  Name: Brandy Crawford MRN: [id number] Date of Birth: Jun 30, 1923   Medicare Important Message Given:  Yes    Camillo Flaming 05/31/2015, 12:47 PM

## 2015-05-31 NOTE — Evaluation (Addendum)
Physical Therapy Evaluation Patient Details Name: Brandy Crawford MRN: [id number] DOB: Apr 17, 1923 Today's Date: 05/31/2015   History of Present Illness  82 yo female admitted with pna, "neck" pain per pt. Hx of TIA, HTN.   Clinical Impression  On eval, pt required Mod assist for mobility-stood EOB x 2 with RW. Very unsteady. Dyspnea 3/4. Pt c/o neck and L LE sciatica. Daughter present-reports plan is for ST rehab at SNF prior to returning to ALF. Recommend SNF.     Follow Up Recommendations SNF    Equipment Recommendations  None recommended by PT    Recommendations for Other Services       Precautions / Restrictions Precautions Precautions: Fall Restrictions Weight Bearing Restrictions: No      Mobility  Bed Mobility Overal bed mobility: Needs Assistance Bed Mobility: Supine to Sit;Sit to Supine     Supine to sit: Min assist;HOB elevated Sit to supine: Min assist;HOB elevated   General bed mobility comments: Assist to get to EOB. Increased time. Moderate reliance on bedrail. Utilized bedpad to aid with scooting.   Transfers Overall transfer level: Needs assistance Equipment used: Rolling walker (2 wheeled) Transfers: Sit to/from Stand Sit to Stand: Mod assist;From elevated surface         General transfer comment: Assist to rise, stabilize, control descent. x2. Pt stood for ~15 seconds each time. Dyspnea 3/4. Remained on [place] O2. Unsteady.   Ambulation/Gait             General Gait Details: NT-pt unable on today  Stairs            Wheelchair Mobility    Modified Rankin (Stroke Patients Only)       Balance Overall balance assessment: Needs assistance         Standing balance support: Bilateral upper extremity supported;During functional activity Standing balance-Leahy Scale: Poor                               Pertinent Vitals/Pain Pain Assessment: Faces Faces Pain Scale: Hurts whole lot Pain Location: neck, L LE (sciatic  pain) Pain Descriptors / Indicators: Radiating;Sharp;Sore Pain Intervention(s): Monitored during session;Repositioned;Limited activity within patient's tolerance; Made RN aware    Home Living Family/patient expects to be discharged to:: Assisted living               Home Equipment: Walker - 2 wheels      Prior Function Level of Independence: Needs assistance   Gait / Transfers Assistance Needed: ambulates over 100 feet to dining hall 3x/day for meals  ADL's / Homemaking Assistance Needed: assist with bathing, dressing        Hand Dominance        Extremity/Trunk Assessment               Lower Extremity Assessment: Generalized weakness      Cervical / Trunk Assessment: Kyphotic  Communication   Communication: HOH  Cognition Arousal/Alertness: Awake/alert Behavior During Therapy: WFL for tasks assessed/performed Overall Cognitive Status: Within Functional Limits for tasks assessed                      General Comments      Exercises        Assessment/Plan    PT Assessment Patient needs continued PT services  PT Diagnosis Difficulty walking;Generalized weakness   PT Problem List Decreased strength;Decreased activity tolerance;Decreased balance;Decreased mobility;Pain;Decreased knowledge of use of DME  PT Treatment Interventions DME instruction;Gait training;Functional mobility training;Therapeutic activities;Patient/family education;Balance training;Therapeutic exercise   PT Goals (Current goals can be found in the Care Plan section) Acute Rehab PT Goals Patient Stated Goal: none stated PT Goal Formulation: With patient/family Time For Goal Achievement: 06/14/15 Potential to Achieve Goals: Fair    Frequency Min 3X/week   Barriers to discharge        Co-evaluation               End of Session Equipment Utilized During Treatment: Oxygen Activity Tolerance: Patient limited by fatigue;Patient limited by pain Patient left: in  bed;with call bell/phone within reach;with bed alarm set;with family/visitor present           Time: [id number] PT Time Calculation (min) (ACUTE ONLY): 20 min   Charges:   PT Evaluation $PT Eval Moderate Complexity: 1 Procedure     PT G Codes:        Weston Anna, MPT Pager: [phone]

## 2015-05-31 NOTE — Care Management Note (Signed)
Case Management Note  Patient Details  Name: Brandy Crawford MRN: [id number] Date of Birth: 06-Jul-1923  Subjective/Objective: 82 y/o f admitted w/PNA. From ALF. PT cons-await recc.CSW following.                   Action/Plan:d/c plan likely SNF.   Expected Discharge Date:                  Expected Discharge Plan:  Skilled Nursing Facility  In-House Referral:  Clinical Social Work  Discharge planning Services  CM Consult  Post Acute Care Choice:    Choice offered to:     DME Arranged:    DME Agency:     HH Arranged:    [place] Agency:     Status of Service:  In process, will continue to follow  Medicare Important Message Given:  Yes Date Medicare IM Given:    Medicare IM give by:    Date Additional Medicare IM Given:    Additional Medicare Important Message give by:     If discussed at [place] of Stay Meetings, dates discussed:    Additional Comments:  Dessa Phi, RN 05/31/2015, 2:48 PM

## 2015-05-31 NOTE — Progress Notes (Signed)
PHARMACY NOTE -  Ceftriaxone  Pharmacy has been assisting with dosing of ceftriaxone for CAP. Dosage remains stable at 2g IV q24h and need for further dosage adjustment appears unlikely at present.    Will sign off at this time.  Please reconsult if a change in clinical status warrants re-evaluation of dosage.  Hershal Coria, PharmD, BCPS Pager: [phone] 05/31/2015 2:21 PM

## 2015-05-31 NOTE — H&P (Signed)
[facility] History and Physical  Brandy Crawford WIO:[id number] DOB: Nov 12, 1923 DOA: 05/30/2015  Referring physician: ED physician, Dr. Laneta Simmers PCP: Estill Dooms, MD   Chief Complaint: dyspnea and cough   HPI:  Pt is 82 yo female who presented to [facility] ED with main concern of one week duration of progressively worsening dyspnea that initially started with exertion and has progressed to dyspnea at rest in the past 48 hours. This has been associated with productive cough of yellow sputum, subjective fevers, chills, chest tightness with coughing spells. Pt reports she was treated with Moxifloxacin prior to this but her symptoms has not gotten much better and she presented to ED. She denies abd or urinary conerns, no focal neurological symptoms.   In ED, pt noted to be hemodynamically stable, VS notable for T 99.21F, HR up to 102 and RR up to 32 bpm. Initial oxygen saturation 87% on RA requiring placement on supplemental oxygen and TRH asked to admit for further evaluation.  Assessment and Plan:  Principal Problem:    Sepsis due to pneumonia ([place]) - pt met criteria for sepsis on admission, highest temp recorder in the system 101 F (per EMS report), WBC < 12K, RR > 22, HR > 90 - appears to be related to PNA even though this is not clearly evident on imaging studies but pt's symptoms and physical exam findings are highly suggestive of bronchitis vs PNA viral vs bacterial etiology  - as pt with persistent low grade fevers, leukocytosis, persistent mild tachypnea, will continue treating with ABX for now - it looks like pt is doing better though and WBC is trending down  - will repeat CBC in AM and continue to monitor clinical response on ABX and BD's   Active Problems:   Thrombocytosis (HCC) - likely reactive  - CBC in AM    Essential hypertension - continue home medical regimen     Acute hyponatremia - mild, encouraged PO intake - BMP In AM  DVT prophylaxis - Lovenox  SQ  Radiological Exams on Admission: Dg Chest 2 View 05/30/2015 No acute abnormality noted.   Ct Angio Chest Pe W/cm &/or Wo Cm 05/30/2015  Severely limited exam by patient motion artifact. No definitive pulmonary embolism is seen. Bibasilar atelectatic changes.   Code Status: Full Family Communication: Pt at bedside Disposition Plan: Admit for further evaluation    [facility] [phone]   Review of Systems:  Constitutional: NNegative for diaphoresis.  HENT: Negative for hearing loss, ear pain, nosebleeds, congestion, sore throat, neck pain, tinnitus and ear discharge.   Eyes: Negative for blurred vision, double vision, photophobia, pain, discharge and redness.  Respiratory: Per HPI Cardiovascular: Negative for palpitations, orthopnea, claudication and leg swelling.  Gastrointestinal: Negative for nausea, vomiting and abdominal pain.  Genitourinary: Negative for dysuria, urgency, frequency, hematuria and flank pain.  Musculoskeletal: Negative for myalgias, back pain, joint pain and falls.  Skin: Negative for itching and rash.  Neurological: Negative for dizziness and weakness. Endo/Heme/Allergies: Negative for environmental allergies and polydipsia. Does not bruise/bleed easily.  Psychiatric/Behavioral: Negative for suicidal ideas. The patient is not nervous/anxious.      Past Medical History  Diagnosis Date  . Asthma   . GERD (gastroesophageal reflux disease)   . Hypertension   . TIA (transient ischemic attack) 09/20/2006  . Cervical disc disease   . Anemia   . Depression   . Lumbosacral spondylosis 10/28/2009  . Abnormality of gait 04/28/2010    History of fall onto right  shoulder-has participated in PT. Uses walker.     . Acute lower GI bleeding 10/30/2014  . Fall at nursing home 10/25/2014  . History of cardiovascular disorder 09/20/2006    X 3 in 2000 now on aspirin.    Marland Kitchen Hyperlipidemia 09/20/2006    05/15/14 LDL 95   . OSTEOPOROSIS 09/20/2006    Noted 2008    .  Overactive bladder 11/29/2013    Myrbetriq prescribed by urology.    . SCOLIOSIS 10/28/2009  . Diarrhea 10/25/14  . Acute encephalopathy 10/25/14    Occurred when septic  . Diverticulosis 10/27/2014  . Cerebrovascular disease 10/27/2014    Small vessel disease  . Cerebral atrophy 10/27/2014  . Aortic atherosclerosis ([place]) 10/27/2014  . Degenerative disc disease, lumbar 10/27/2014  . Scoliosis 10/27/2014    Past Surgical History  Procedure Laterality Date  . Appendectomy    . Abdominal hysterectomy    . Tonsillectomy    . Cholecystectomy      Social History:  reports that she has never smoked. She has never used smokeless tobacco. She reports that she does not drink alcohol or use illicit drugs.  Allergies  Allergen Reactions  . Tramadol Other (See Comments)    Hallucinations, agitation/combativeness  . Amoxicillin     Per MAR  . Oxycodone Other (See Comments)    Per MAR   . Sulfamethoxazole     Per MAR   . Penicillins Rash    Family History  Problem Relation Age of Onset  . Pancreatic cancer Father 91    deceased  . Heart failure Mother 104    deceased  . Leukemia Brother 34    deceased  . Diabetes type II    . Alcohol abuse      Medication Sig  acetaminophen (TYLENOL) 325 MG tablet Take 650 mg by mouth every 4 (four) hours as needed for moderate pain or headache.  albuterol (PROVENTIL HFA;VENTOLIN HFA) 108 (90 BASE) MCG/ACT inhaler Inhale 2 puffs into the lungs every 6 (six) hours as needed for wheezing or shortness of breath.  alum & mag hydroxide-simeth (MAALOX/MYLANTA) 200-200-20 MG/5ML suspension Take 30 mLs by mouth as needed for indigestion or heartburn.  amLODipine (NORVASC) 5 MG tablet Take 5 mg by mouth daily with breakfast.   cholestyramine (QUESTRAN) 4 G packet Take 4 g by mouth 2 (two) times daily.  ferrous sulfate 325 (65 FE) MG tablet Take 325 mg by mouth daily.  loperamide (IMODIUM) 2 MG capsule Take one to two tablets by mouth as needed for  diarrhea  losartan (COZAAR) 100 MG tablet Take 100 mg by mouth daily with breakfast.   Mirabegron ER (MYRBETRIQ) 25 MG TB24 Take 25 mg by mouth daily with breakfast. Prescribed by urology  nitroGLYCERIN (NITROSTAT) 0.4 MG SL tablet Place 0.4 mg under the tongue every 5 (five) minutes as needed for chest pain.  omeprazole (PRILOSEC) 20 MG capsule Take 20 mg by mouth daily.  pyridOXINE (VITAMIN B-6) 100 MG tablet Take 100 mg by mouth daily with breakfast.   traMADol (ULTRAM) 50 MG tablet Take 50 mg by mouth every 6 (six) hours as needed for moderate pain or severe pain.    Physical Exam: Filed Vitals:   05/30/15 2315 05/31/15 0000 05/31/15 0207 05/31/15 0629  BP:  140/65 157/68 163/64  Pulse: 88  92 88  Temp:   99.9 F (37.7 C) 98.5 F (36.9 C)  TempSrc:   Oral Oral  Resp:    18  Height:  _0  (1.676 m)   Weight:   64 kg (141 lb 1.5 oz)   SpO2: 96%  98% 93%    Physical Exam  Constitutional: Appears calm but with mild tachypnea HENT: Normocephalic. External right and left ear normal. Oropharynx is clear and moist.  Eyes: Conjunctivae and EOM are normal. PERRLA, no scleral icterus.  Neck: Normal ROM. Neck supple. No JVD. No tracheal deviation. No thyromegaly.  CVS: RRR, S1/S2 +, no gallops, no carotid bruit.  Pulmonary: Course breath sounds with some dullness to percussion in lower lobes bilaterally, mild exp wheezing also noted mostly in the lower lobes  Abdominal: Soft. BS +,  no distension, tenderness, rebound or guarding.  Musculoskeletal: Normal range of motion. No edema and no tenderness.  Lymphadenopathy: No lymphadenopathy noted, cervical, inguinal. Neuro: Alert. Normal reflexes, muscle tone coordination. No cranial nerve deficit. Skin: Skin is warm and dry. No rash noted. Not diaphoretic. No erythema. No pallor.  Psychiatric: Normal mood and affect. Behavior, judgment, thought content normal.   Labs on Admission:  Basic Metabolic Panel:  Recent Labs Lab 05/30/15 1248   NA 133*  K 3.7  CL 101  CO2 23  GLUCOSE 128*  BUN 11  CREATININE 0.65  CALCIUM 9.4   Liver Function Tests:  Recent Labs Lab 05/30/15 1248  AST 24  ALT 28  ALKPHOS 69  BILITOT 1.1  PROT 7.4  ALBUMIN 3.6   CBC:  Recent Labs Lab 05/30/15 1237  WBC 15.3*  NEUTROABS 12.4*  HGB 12.9  HCT 38.3  MCV 93.6  PLT 415*   EKG: pending   If 7PM-7AM, please contact night-coverage [email or web address] Password [facility] 05/31/2015, 9:37 AM

## 2015-05-31 NOTE — Progress Notes (Signed)
Pharmacy Antibiotic Note  Brandy Crawford is a 82 y.o. female admitted on 05/30/2015 with pneumonia.  Pharmacy has been consulted for Ceftriaxone dosing.  Plan: Ceftriaxone 2gm iv q24hr  Height: 5\' 6"  (167.6 cm) Weight: 141 lb 1.5 oz (64 kg) IBW/kg (Calculated) : 59.3  Temp (24hrs), Avg:98.8 F (37.1 C), Min:98 F (36.7 C), Max:99.9 F (37.7 C)   Recent Labs Lab 05/30/15 1237 05/30/15 1248  WBC 15.3*  --   CREATININE  --  0.65  LATICACIDVEN  --  1.09    Estimated Creatinine Clearance: 44.6 mL/min (by C-G formula based on Cr of 0.65).    Allergies  Allergen Reactions  . Tramadol Other (See Comments)    Hallucinations, agitation/combativeness  . Amoxicillin     Per MAR  . Oxycodone Other (See Comments)    Per MAR   . Sulfamethoxazole     Per MAR   . Penicillins Rash    Thank you for allowing pharmacy to be a part of this patient's care.  Brandy Crawford 05/31/2015 4:04 AM

## 2015-06-01 ENCOUNTER — Inpatient Hospital Stay (HOSPITAL_COMMUNITY): Payer: Medicare Other

## 2015-06-01 ENCOUNTER — Encounter (HOSPITAL_COMMUNITY): Payer: Self-pay | Admitting: Radiology

## 2015-06-01 DIAGNOSIS — D473 Essential (hemorrhagic) thrombocythemia: Secondary | ICD-10-CM

## 2015-06-01 LAB — BASIC METABOLIC PANEL
Anion gap: 9 (ref 5–15)
BUN: 9 mg/dL (ref 6–20)
CHLORIDE: 104 mmol/L (ref 101–111)
CO2: 26 mmol/L (ref 22–32)
Calcium: 9.8 mg/dL (ref 8.9–10.3)
Creatinine, Ser: 0.5 mg/dL (ref 0.44–1.00)
Glucose, Bld: 109 mg/dL — ABNORMAL HIGH (ref 65–99)
Potassium: 3.5 mmol/L (ref 3.5–5.1)
SODIUM: 139 mmol/L (ref 135–145)

## 2015-06-01 LAB — CBC
HCT: 38.7 % (ref 36.0–46.0)
Hemoglobin: 12.9 g/dL (ref 12.0–15.0)
MCH: 31.4 pg (ref 26.0–34.0)
MCHC: 33.3 g/dL (ref 30.0–36.0)
MCV: 94.2 fL (ref 78.0–100.0)
PLATELETS: 456 10*3/uL — AB (ref 150–400)
RBC: 4.11 MIL/uL (ref 3.87–5.11)
RDW: 12.6 % (ref 11.5–15.5)
WBC: 10.3 10*3/uL (ref 4.0–10.5)

## 2015-06-01 LAB — STREP PNEUMONIAE URINARY ANTIGEN: STREP PNEUMO URINARY ANTIGEN: NEGATIVE

## 2015-06-01 NOTE — NC FL2 (Signed)
[place] LEVEL OF CARE SCREENING TOOL     IDENTIFICATION  Patient Name: Brandy Crawford Birthdate: 10-23-1923 Sex: female Admission Date (Current Location): 05/30/2015  [facility] and [place] Number:  Herbalist and Address:  [facility],  [place] [address] [place]      Provider Number: [id number]  Attending Physician Name and Address:  Theodis Blaze, MD  Relative Name and Phone Number:       Current Level of Care: Hospital Recommended Level of Care: [place] Prior Approval Number:    Date Approved/Denied:   PASRR Number: [id number]  Discharge Plan: SNF    Current Diagnoses: Patient Active Problem List   Diagnosis Date Noted  . Sepsis due to pneumonia ([place]) 05/31/2015  . Thrombocytosis ([place]) 05/31/2015  . Acute hyponatremia 05/31/2015  . Essential hypertension 09/20/2006    Orientation RESPIRATION BLADDER Height & Weight     Self, Time, Situation, Place  O2 (2L) Incontinent Weight: 140 lb 3.4 oz (63.6 kg) Height:  5\' 6"  (167.6 cm)  BEHAVIORAL SYMPTOMS/MOOD NEUROLOGICAL BOWEL NUTRITION STATUS      Continent Diet (Heart)  AMBULATORY STATUS COMMUNICATION OF NEEDS Skin   Extensive Assist Verbally Normal                       Personal Care Assistance Level of Assistance  Bathing, Dressing Bathing Assistance: Limited assistance   Dressing Assistance: Limited assistance     Functional Limitations Info             SPECIAL CARE FACTORS FREQUENCY  PT (By licensed PT), OT (By licensed OT)     PT Frequency: 5 OT Frequency: 5            Contractures      Additional Factors Info  Code Status, Allergies Code Status Info: Fullcode Allergies Info: Allergies:  Tramadol, Amoxicillin, Oxycodone, Sulfamethoxazole, Penicillins           Current Medications (06/01/2015):  This is the current hospital active medication list Current Facility-Administered Medications  Medication Dose Route  Frequency Provider Last Rate Last Dose  . acetaminophen (TYLENOL) tablet 650 mg  650 mg Oral Q6H PRN Theodis Blaze, MD   650 mg at 05/31/15 0847  . albuterol (PROVENTIL) (2.5 MG/3ML) 0.083% nebulizer solution 2.5 mg  2.5 mg Inhalation Q2H PRN Theodis Blaze, MD      . alum & mag hydroxide-simeth (MAALOX/MYLANTA) 200-200-20 MG/5ML suspension 30 mL  30 mL Oral PRN Theodis Blaze, MD      . amLODipine (NORVASC) tablet 5 mg  5 mg Oral Q breakfast Theodis Blaze, MD   5 mg at 06/01/15 1120  . azithromycin (ZITHROMAX) 500 mg in dextrose 5 % 250 mL IVPB  500 mg Intravenous Q24H Leana Gamer, MD   500 mg at 05/31/15 1211  . cefTRIAXone (ROCEPHIN) 2 g in dextrose 5 % 50 mL IVPB  2 g Intravenous Q24H Leana Gamer, MD   2 g at 06/01/15 0755  . cholestyramine (QUESTRAN) packet 4 g  4 g Oral BID Theodis Blaze, MD   4 g at 05/31/15 2237  . enoxaparin (LOVENOX) injection 40 mg  40 mg Subcutaneous Daily Theodis Blaze, MD   40 mg at 06/01/15 1119  . ferrous sulfate tablet 325 mg  325 mg Oral Daily Theodis Blaze, MD   325 mg at 06/01/15 1120  . losartan (COZAAR) tablet 100 mg  100 mg Oral Q breakfast Theodis Blaze, MD   100 mg at 06/01/15 1119  . mirabegron ER (MYRBETRIQ) tablet 25 mg  25 mg Oral Q breakfast Theodis Blaze, MD   25 mg at 06/01/15 1119  . nitroGLYCERIN (NITROSTAT) SL tablet 0.4 mg  0.4 mg Sublingual Q5 min PRN Theodis Blaze, MD      . ondansetron [facility]) tablet 4 mg  4 mg Oral Q6H PRN Theodis Blaze, MD       Or  . ondansetron [facility]) injection 4 mg  4 mg Intravenous Q6H PRN Theodis Blaze, MD      . pantoprazole (PROTONIX) EC tablet 40 mg  40 mg Oral Daily Theodis Blaze, MD   40 mg at 06/01/15 1119  . pyridOXINE (VITAMIN B-6) tablet 100 mg  100 mg Oral Q breakfast Theodis Blaze, MD   100 mg at 06/01/15 1120  . sodium chloride flush (NS) 0.9 % injection 3 mL  3 mL Intravenous Q12H Theodis Blaze, MD   3 mL at 05/31/15 2241  . traMADol (ULTRAM) tablet 50 mg  50 mg Oral Q6H PRN Theodis Blaze,  MD   50 mg at 06/01/15 1244   Facility-Administered Medications Ordered in Other Encounters  Medication Dose Route Frequency Provider Last Rate Last Dose  . 0.9 %  sodium chloride infusion    Continuous PRN Ofilia Neas, CRNA         Discharge Medications: Please see discharge summary for a list of discharge medications.  Relevant Imaging Results:  Relevant Lab Results:   Additional Information SSN: [id number]  Standley Brooking, LCSW

## 2015-06-01 NOTE — Progress Notes (Signed)
Patient ID: Brandy Crawford, female   DOB: Jul 28, 1923, 82 y.o.   MRN: [id number]  [facility] PROGRESS NOTE  Brandy Crawford [id number] DOB: [birth year] DOA: 05/30/2015 PCP: Estill Dooms, MD   Brief narrative:    Pt is 82 yo female who presented to [facility] ED with main concern of one week duration of progressively worsening dyspnea that initially started with exertion and has progressed to dyspnea at rest in the past 48 hours. This has been associated with productive cough of yellow sputum, subjective fevers, chills, chest tightness with coughing spells. Pt reports she was treated with Moxifloxacin prior to this but her symptoms has not gotten much better and she presented to ED. She denies abd or urinary conerns, no focal neurological symptoms.   In ED, pt noted to be hemodynamically stable, VS notable for T 99.46F, HR up to 102 and RR up to 32 bpm. Initial oxygen saturation 87% on RA requiring placement on supplemental oxygen and TRH asked to admit for further evaluation.  Assessment/Plan:    Principal Problem:  Sepsis due to pneumonia ([place]) - pt met criteria for sepsis on admission, highest temp recorder in the system 101 F (per EMS report), WBC < 12K, RR > 22, HR > 90 - appears to be related to PNA even though this is not clearly evident on imaging studies but pt's symptoms and physical exam findings are highly suggestive of bronchitis vs PNA viral vs bacterial etiology  - pt continues to improve, sepsis etiology resolving, sputum cultures negative so far  - CBC in AM  Active Problems:  Thrombocytosis (HCC) - likely reactive  - CBC in AM   Essential hypertension - continue home medical regimen  - reasonable inpatient control    Acute hyponatremia - mild, encouraged PO intake - resolved - BMP in AM  DVT prophylaxis - Lovenox SQ  Code Status: Full.  Family Communication:  plan of care discussed with the patient and daughter at bedside  Disposition Plan: SNF  by 2/20   IV access:  Peripheral IV  Procedures and diagnostic studies:    Dg Chest 2 View  05/30/2015  CLINICAL DATA:  Productive cough and chest pain EXAM: CHEST  2 VIEW COMPARISON:  10/25/2014 FINDINGS: Cardiac shadow is at the upper limits of normal in size but stable. Aortic calcifications are again seen. The lungs are well aerated bilaterally. No focal infiltrate or sizable effusion is seen. Degenerative changes of thoracic spine are noted. IMPRESSION: No acute abnormality noted. Electronically Signed   By: Inez Catalina M.D.   On: 05/30/2015 13:00   Ct Angio Chest Pe W/cm &/or Wo Cm  05/30/2015  CLINICAL DATA:  Recent history of pneumonia with right-sided chest pain, initial encounter EXAM: CT ANGIOGRAPHY CHEST WITH CONTRAST TECHNIQUE: Multidetector CT imaging of the chest was performed using the standard protocol during bolus administration of intravenous contrast. Multiplanar CT image reconstructions and MIPs were obtained to evaluate the vascular anatomy. CONTRAST:  126m OMNIPAQUE IOHEXOL 350 MG/ML SOLN COMPARISON:  01/13/2014 FINDINGS: Lungs are well aerated bilaterally but demonstrate minimal atelectatic changes bilaterally in the lower lobes. No focal infiltrate or sizable effusion is seen. No definitive parenchymal nodule is noted. Inflammatory changes seen on prior examination are not well appreciated on this exam. The thoracic inlet is within normal limits. The thoracic aorta and its branches demonstrate calcifications although no aneurysmal dilatation or dissection is seen. Pulmonary artery demonstrates a normal branching pattern. Considerable respiratory motion limits evaluation of the distal branches.  No definitive pulmonary embolism is seen. The visualized upper abdomen is within normal limits. The bony structures show degenerative change of thoracic spine. No definitive acute bony abnormality is seen. Review of the MIP images confirms the above findings. IMPRESSION: Severely limited  exam by patient motion artifact. No definitive pulmonary embolism is seen. Bibasilar atelectatic changes. Electronically Signed   By: Inez Catalina M.D.   On: 05/30/2015 16:45   Ct Thoracic Spine Wo Contrast  06/01/2015  CLINICAL DATA:  82 year old female with cough and thoracic spine/upper back pain. EXAM: CT THORACIC SPINE WITHOUT CONTRAST TECHNIQUE: Multidetector CT imaging of the thoracic spine was performed without intravenous contrast administration. Multiplanar CT image reconstructions were also generated. COMPARISON:  Concurrently obtained CT scan of the chest FINDINGS: There is no evidence of acute fracture or malalignment. The vertebral body heights are maintained. Mild multilevel degenerative disc disease without focality. No significant facet arthropathy. Gentle levoconvex scoliosis of the thoracic spine with apex at T11-T12. Normal bony mineralization.  No lytic or blastic osseous lesion. IMPRESSION: 1. No acute fracture or malalignment. 2. Levoconvex scoliosis of the thoracic spine. 3. Mild multilevel degenerative disc disease without focality. Electronically Signed   By: Jacqulynn Cadet M.D.   On: 06/01/2015 12:52    Medical Consultants:  None  Other Consultants:  PT - SNF recommended   IAnti-Infectives:   Zithromax 2/17 --> Rocephin 2/17 -->  Faye Ramsay, MD  [facility] Pager [phone]  If 7PM-7AM, please contact night-coverage [email or web address] Password [facility] 06/01/2015, 2:29 PM   LOS: 2 days   HPI/Subjective: No events overnight.   Objective: Filed Vitals:   05/31/15 1500 05/31/15 2152 06/01/15 0619 06/01/15 1340  BP: 152/63 152/58 145/58 125/56  Pulse: 90 87 76 75  Temp: 98.7 F (37.1 C) 98.9 F (37.2 C) 97.5 F (36.4 C) 97.4 F (36.3 C)  TempSrc: Oral Oral Oral Oral  Resp: '16 18 18 24  '$ Height:      Weight:   63.6 kg (140 lb 3.4 oz)   SpO2: 96% 99% 100% 99%    Intake/Output Summary (Last 24 hours) at 06/01/15 1429 Last data filed at 06/01/15 1300  Gross  per 24 hour  Intake    720 ml  Output      5 ml  Net    715 ml    Exam:   General:  Pt is alert, follows commands appropriately, not in acute distress  Cardiovascular: Regular rate and rhythm, no rubs, no gallops  Respiratory: Clear to auscultation bilaterally, no wheezing, no crackles, no rhonchi  Abdomen: Soft, non tender, non distended, bowel sounds present, no guarding  Extremities: No edema, pulses DP and PT palpable bilaterally  Neuro: Grossly nonfocal  Data Reviewed: Basic Metabolic Panel:  Recent Labs Lab 05/30/15 1248 05/31/15 1022 06/01/15 0552  NA 133*  --  139  K 3.7  --  3.5  CL 101  --  104  CO2 23  --  26  GLUCOSE 128*  --  109*  BUN 11  --  9  CREATININE 0.65 0.62 0.50  CALCIUM 9.4  --  9.8   Liver Function Tests:  Recent Labs Lab 05/30/15 1248  AST 24  ALT 28  ALKPHOS 69  BILITOT 1.1  PROT 7.4  ALBUMIN 3.6   CBC:  Recent Labs Lab 05/30/15 1237 05/31/15 1022 06/01/15 0552  WBC 15.3* 12.1* 10.3  NEUTROABS 12.4*  --   --   HGB 12.9 11.7* 12.9  HCT 38.3 33.4* 38.7  MCV  93.6 92.3 94.2  PLT 415* 377 456*    Recent Results (from the past 240 hour(s))  Blood culture (routine x 2)     Status: None (Preliminary result)   Collection Time: 05/30/15 12:37 PM  Result Value Ref Range Status   Specimen Description BLOOD RIGHT HAND  Final   Special Requests BOTTLES DRAWN AEROBIC AND ANAEROBIC 5CC  Final   Culture   Final    NO GROWTH 1 DAY Performed at [facility]    Report Status PENDING  Incomplete  Blood culture (routine x 2)     Status: None (Preliminary result)   Collection Time: 05/30/15  2:41 PM  Result Value Ref Range Status   Specimen Description BLOOD RIGHT ARM  Final   Special Requests BOTTLES DRAWN AEROBIC AND ANAEROBIC 5CC  Final   Culture   Final    NO GROWTH 1 DAY Performed at [facility]    Report Status PENDING  Incomplete  MRSA PCR Screening     Status: None   Collection Time: 05/31/15  5:11 AM   Result Value Ref Range Status   MRSA by PCR NEGATIVE NEGATIVE Final    Comment:        The GeneXpert MRSA Assay (FDA approved for NASAL specimens only), is one component of a comprehensive MRSA colonization surveillance program. It is not intended to diagnose MRSA infection nor to guide or monitor treatment for MRSA infections.      Scheduled Meds: . amLODipine  5 mg Oral Q breakfast  . azithromycin  500 mg Intravenous Q24H  . cefTRIAXone (ROCEPHIN)  IV  2 g Intravenous Q24H  . cholestyramine  4 g Oral BID  . enoxaparin (LOVENOX) injection  40 mg Subcutaneous Daily  . ferrous sulfate  325 mg Oral Daily  . losartan  100 mg Oral Q breakfast  . mirabegron ER  25 mg Oral Q breakfast  . pantoprazole  40 mg Oral Daily  . pyridOXINE  100 mg Oral Q breakfast  . sodium chloride flush  3 mL Intravenous Q12H   Continuous Infusions:

## 2015-06-02 LAB — BASIC METABOLIC PANEL
ANION GAP: 8 (ref 5–15)
BUN: 16 mg/dL (ref 6–20)
CALCIUM: 9.5 mg/dL (ref 8.9–10.3)
CO2: 27 mmol/L (ref 22–32)
Chloride: 100 mmol/L — ABNORMAL LOW (ref 101–111)
Creatinine, Ser: 0.67 mg/dL (ref 0.44–1.00)
GFR calc Af Amer: >60 mL/min (ref >60)
GLUCOSE: 118 mg/dL — AB (ref 65–99)
POTASSIUM: 3.8 mmol/L (ref 3.5–5.1)
Sodium: 135 mmol/L (ref 135–145)

## 2015-06-02 LAB — CBC
HEMATOCRIT: 36.6 % (ref 36.0–46.0)
Hemoglobin: 12.3 g/dL (ref 12.0–15.0)
MCH: 31.1 pg (ref 26.0–34.0)
MCHC: 33.6 g/dL (ref 30.0–36.0)
MCV: 92.4 fL (ref 78.0–100.0)
Platelets: 486 10*3/uL — ABNORMAL HIGH (ref 150–400)
RBC: 3.96 MIL/uL (ref 3.87–5.11)
RDW: 12.2 % (ref 11.5–15.5)
WBC: 10.7 10*3/uL — AB (ref 4.0–10.5)

## 2015-06-02 MED ORDER — HYDRALAZINE HCL 20 MG/ML IJ SOLN: 5.0000 mg | INTRAMUSCULAR | Status: DC | PRN

## 2015-06-02 NOTE — Progress Notes (Signed)
Pt triaged to 1527, report given to [facility]

## 2015-06-02 NOTE — Progress Notes (Signed)
Utilization review completed.  

## 2015-06-02 NOTE — Progress Notes (Signed)
Patient ID: Brandy Crawford, female   DOB: 1924-03-22, 82 y.o.   MRN: [id number]  [facility] PROGRESS NOTE  HAYDIN DUNN [id number] DOB: Apr 19, 1923 DOA: 05/30/2015 PCP: Estill Dooms, MD   Brief narrative:    Pt is 82 yo female who presented to [facility] ED with main concern of one week duration of progressively worsening dyspnea that initially started with exertion and has progressed to dyspnea at rest in the past 48 hours. This has been associated with productive cough of yellow sputum, subjective fevers, chills, chest tightness with coughing spells. Pt reports she was treated with Moxifloxacin prior to this but her symptoms has not gotten much better and she presented to ED. She denies abd or urinary conerns, no focal neurological symptoms.   In ED, pt noted to be hemodynamically stable, VS notable for T 99.77F, HR up to 102 and RR up to 32 bpm. Initial oxygen saturation 87% on RA requiring placement on supplemental oxygen and TRH asked to admit for further evaluation.  Assessment/Plan:    Principal Problem:  Sepsis due to pneumonia ([place]) - pt met criteria for sepsis on admission, highest temp recorder in the system 101 F (per EMS report), WBC < 12K, RR > 22, HR > 90 - appears to be related to PNA even though this is not clearly evident on imaging studies but pt's symptoms and physical exam findings are highly suggestive of bronchitis vs PNA viral vs bacterial etiology  - pt continues to improve, sepsis etiology resolving WBC still slightly elevated, sputum cultures negative so far  - CBC in AM  Active Problems:  Thrombocytosis (HCC) - likely reactive  - CBC in AM   Essential hypertension - continue home medical regimen  - add hydralazine as needed for better BP control    Acute hyponatremia - mild, encouraged PO intake - resolved - BMP in AM  DVT prophylaxis - Lovenox SQ  Code Status: Full.  Family Communication:  plan of care discussed with the patient  and daughter at bedside  Disposition Plan: SNF by 2/20   IV access:  Peripheral IV  Procedures and diagnostic studies:    Dg Chest 2 View 05/30/2015 No acute abnormality noted.   Ct Angio Chest Pe W/cm &/or Wo Cm 05/30/2015 : Severely limited exam by patient motion artifact. No definitive pulmonary embolism is seen. Bibasilar atelectatic changes.   Ct Thoracic Spine Wo Contrast 06/01/2015  No acute fracture or malalignment. 2. Levoconvex scoliosis of the thoracic spine. 3. Mild multilevel degenerative disc disease without focality.  Medical Consultants:  None  Other Consultants:  PT - SNF recommended   IAnti-Infectives:   Zithromax 2/17 --> Rocephin 2/17 -->  Faye Ramsay, MD  [facility] Pager [phone]  If 7PM-7AM, please contact night-coverage [email or web address] Password [email or web address] 06/02/2015, 1:04 PM   LOS: 3 days   HPI/Subjective: No events overnight.   Objective: Filed Vitals:   06/01/15 1340 06/01/15 2039 06/02/15 0559 06/02/15 0906  BP: 125/56 147/68 149/60 162/58  Pulse: 75 83 66   Temp: 97.4 F (36.3 C) 98.5 F (36.9 C) 98.2 F (36.8 C)   TempSrc: Oral Oral Axillary   Resp: '24 22 18   '$ Height:      Weight:   67.2 kg (148 lb 2.4 oz)   SpO2: 99% 96% 98%     Intake/Output Summary (Last 24 hours) at 06/02/15 1304 Last data filed at 06/02/15 1008  Gross per 24 hour  Intake    363 ml  Output  2 ml  Net    361 ml    Exam:   General:  Pt is alert, not in acute distress, HOH  Cardiovascular: Regular rate and rhythm, no rubs, no gallops  Respiratory: Clear to auscultation bilaterally, no wheezing, diminished breath sounds at bases   Abdomen: Soft, non tender, non distended, bowel sounds present, no guarding  Extremities: pulses DP and PT palpable bilaterally  Data Reviewed: Basic Metabolic Panel:  Recent Labs Lab 05/30/15 1248 05/31/15 1022 06/01/15 0552 06/02/15 0520  NA 133*  --  139 135  K 3.7  --  3.5 3.8  CL 101  --  104 100*  CO2 23  --   26 27  GLUCOSE 128*  --  109* 118*  BUN 11  --  9 16  CREATININE 0.65 0.62 0.50 0.67  CALCIUM 9.4  --  9.8 9.5   Liver Function Tests:  Recent Labs Lab 05/30/15 1248  AST 24  ALT 28  ALKPHOS 69  BILITOT 1.1  PROT 7.4  ALBUMIN 3.6   CBC:  Recent Labs Lab 05/30/15 1237 05/31/15 1022 06/01/15 0552 06/02/15 0520  WBC 15.3* 12.1* 10.3 10.7*  NEUTROABS 12.4*  --   --   --   HGB 12.9 11.7* 12.9 12.3  HCT 38.3 33.4* 38.7 36.6  MCV 93.6 92.3 94.2 92.4  PLT 415* 377 456* 486*    Recent Results (from the past 240 hour(s))  Blood culture (routine x 2)     Status: None (Preliminary result)   Collection Time: 05/30/15 12:37 PM  Result Value Ref Range Status   Specimen Description BLOOD RIGHT HAND  Final   Special Requests BOTTLES DRAWN AEROBIC AND ANAEROBIC 5CC  Final   Culture   Final    NO GROWTH 2 DAYS Performed at [facility]    Report Status PENDING  Incomplete  Blood culture (routine x 2)     Status: None (Preliminary result)   Collection Time: 05/30/15  2:41 PM  Result Value Ref Range Status   Specimen Description BLOOD RIGHT ARM  Final   Special Requests BOTTLES DRAWN AEROBIC AND ANAEROBIC 5CC  Final   Culture   Final    NO GROWTH 2 DAYS Performed at [facility]    Report Status PENDING  Incomplete  MRSA PCR Screening     Status: None   Collection Time: 05/31/15  5:11 AM  Result Value Ref Range Status   MRSA by PCR NEGATIVE NEGATIVE Final    Comment:        The GeneXpert MRSA Assay (FDA approved for NASAL specimens only), is one component of a comprehensive MRSA colonization surveillance program. It is not intended to diagnose MRSA infection nor to guide or monitor treatment for MRSA infections.      Scheduled Meds: . amLODipine  5 mg Oral Q breakfast  . azithromycin  500 mg Intravenous Q24H  . cefTRIAXone (ROCEPHIN)  IV  2 g Intravenous Q24H  . cholestyramine  4 g Oral BID  . enoxaparin (LOVENOX) injection  40 mg Subcutaneous  Daily  . ferrous sulfate  325 mg Oral Daily  . losartan  100 mg Oral Q breakfast  . mirabegron ER  25 mg Oral Q breakfast  . pantoprazole  40 mg Oral Daily  . pyridOXINE  100 mg Oral Q breakfast  . sodium chloride flush  3 mL Intravenous Q12H   Continuous Infusions:

## 2015-06-03 DIAGNOSIS — R269 Unspecified abnormalities of gait and mobility: Secondary | ICD-10-CM | POA: Diagnosis not present

## 2015-06-03 DIAGNOSIS — R1312 Dysphagia, oropharyngeal phase: Secondary | ICD-10-CM | POA: Diagnosis not present

## 2015-06-03 DIAGNOSIS — G459 Transient cerebral ischemic attack, unspecified: Secondary | ICD-10-CM | POA: Diagnosis not present

## 2015-06-03 DIAGNOSIS — H81399 Other peripheral vertigo, unspecified ear: Secondary | ICD-10-CM | POA: Diagnosis not present

## 2015-06-03 DIAGNOSIS — D509 Iron deficiency anemia, unspecified: Secondary | ICD-10-CM | POA: Diagnosis not present

## 2015-06-03 DIAGNOSIS — R2681 Unsteadiness on feet: Secondary | ICD-10-CM | POA: Diagnosis not present

## 2015-06-03 DIAGNOSIS — R41841 Cognitive communication deficit: Secondary | ICD-10-CM | POA: Diagnosis not present

## 2015-06-03 DIAGNOSIS — N318 Other neuromuscular dysfunction of bladder: Secondary | ICD-10-CM | POA: Diagnosis not present

## 2015-06-03 DIAGNOSIS — F4323 Adjustment disorder with mixed anxiety and depressed mood: Secondary | ICD-10-CM | POA: Diagnosis not present

## 2015-06-03 DIAGNOSIS — R296 Repeated falls: Secondary | ICD-10-CM | POA: Diagnosis not present

## 2015-06-03 DIAGNOSIS — J45998 Other asthma: Secondary | ICD-10-CM | POA: Diagnosis not present

## 2015-06-03 DIAGNOSIS — M25529 Pain in unspecified elbow: Secondary | ICD-10-CM | POA: Diagnosis not present

## 2015-06-03 DIAGNOSIS — M4806 Spinal stenosis, lumbar region: Secondary | ICD-10-CM | POA: Diagnosis not present

## 2015-06-03 DIAGNOSIS — E871 Hypo-osmolality and hyponatremia: Secondary | ICD-10-CM | POA: Diagnosis not present

## 2015-06-03 DIAGNOSIS — M6281 Muscle weakness (generalized): Secondary | ICD-10-CM | POA: Diagnosis not present

## 2015-06-03 DIAGNOSIS — J188 Other pneumonia, unspecified organism: Secondary | ICD-10-CM | POA: Diagnosis not present

## 2015-06-03 DIAGNOSIS — D473 Essential (hemorrhagic) thrombocythemia: Secondary | ICD-10-CM | POA: Diagnosis not present

## 2015-06-03 DIAGNOSIS — I10 Essential (primary) hypertension: Secondary | ICD-10-CM | POA: Diagnosis not present

## 2015-06-03 DIAGNOSIS — J158 Pneumonia due to other specified bacteria: Secondary | ICD-10-CM | POA: Diagnosis not present

## 2015-06-03 DIAGNOSIS — S82001D Unspecified fracture of right patella, subsequent encounter for closed fracture with routine healing: Secondary | ICD-10-CM | POA: Diagnosis not present

## 2015-06-03 DIAGNOSIS — K2971 Gastritis, unspecified, with bleeding: Secondary | ICD-10-CM | POA: Diagnosis not present

## 2015-06-03 DIAGNOSIS — A419 Sepsis, unspecified organism: Secondary | ICD-10-CM | POA: Diagnosis not present

## 2015-06-03 DIAGNOSIS — W19XXXA Unspecified fall, initial encounter: Secondary | ICD-10-CM | POA: Diagnosis not present

## 2015-06-03 DIAGNOSIS — J189 Pneumonia, unspecified organism: Secondary | ICD-10-CM | POA: Diagnosis not present

## 2015-06-03 DIAGNOSIS — D649 Anemia, unspecified: Secondary | ICD-10-CM | POA: Diagnosis not present

## 2015-06-03 DIAGNOSIS — E162 Hypoglycemia, unspecified: Secondary | ICD-10-CM | POA: Diagnosis not present

## 2015-06-03 DIAGNOSIS — K219 Gastro-esophageal reflux disease without esophagitis: Secondary | ICD-10-CM | POA: Diagnosis not present

## 2015-06-03 DIAGNOSIS — R5381 Other malaise: Secondary | ICD-10-CM | POA: Diagnosis not present

## 2015-06-03 DIAGNOSIS — G8929 Other chronic pain: Secondary | ICD-10-CM | POA: Diagnosis not present

## 2015-06-03 DIAGNOSIS — M545 Low back pain: Secondary | ICD-10-CM | POA: Diagnosis not present

## 2015-06-03 LAB — LEGIONELLA ANTIGEN, URINE

## 2015-06-03 LAB — CBC
HEMATOCRIT: 36.4 % (ref 36.0–46.0)
Hemoglobin: 12.1 g/dL (ref 12.0–15.0)
MCH: 31.3 pg (ref 26.0–34.0)
MCHC: 33.2 g/dL (ref 30.0–36.0)
MCV: 94.3 fL (ref 78.0–100.0)
Platelets: 535 10*3/uL — ABNORMAL HIGH (ref 150–400)
RBC: 3.86 MIL/uL — ABNORMAL LOW (ref 3.87–5.11)
RDW: 12.6 % (ref 11.5–15.5)
WBC: 9.2 10*3/uL (ref 4.0–10.5)

## 2015-06-03 LAB — BASIC METABOLIC PANEL
ANION GAP: 8 (ref 5–15)
BUN: 14 mg/dL (ref 6–20)
CALCIUM: 9.6 mg/dL (ref 8.9–10.3)
CO2: 25 mmol/L (ref 22–32)
Chloride: 102 mmol/L (ref 101–111)
Creatinine, Ser: 0.6 mg/dL (ref 0.44–1.00)
GFR calc Af Amer: >60 mL/min (ref >60)
GFR calc non Af Amer: >60 mL/min (ref >60)
GLUCOSE: 117 mg/dL — AB (ref 65–99)
Potassium: 3.9 mmol/L (ref 3.5–5.1)
Sodium: 135 mmol/L (ref 135–145)

## 2015-06-03 MED ORDER — LEVOFLOXACIN 500 MG PO TABS: 500.0000 mg | ORAL_TABLET | Freq: Every day | ORAL | Status: DC

## 2015-06-03 MED ORDER — TRAMADOL HCL 50 MG PO TABS: 50.0000 mg | ORAL_TABLET | Freq: Four times a day (QID) | ORAL | Status: DC | PRN

## 2015-06-03 MED ORDER — AZITHROMYCIN 500 MG PO TABS
500.0000 mg | ORAL_TABLET | Freq: Every day | ORAL | Status: DC
  Administered 2015-06-03: 500 mg via ORAL
  Filled 2015-06-03: qty 1

## 2015-06-03 NOTE — Discharge Instructions (Signed)

## 2015-06-03 NOTE — Progress Notes (Signed)
PHARMACIST - PHYSICIAN COMMUNICATION DR:   Doyle Askew CONCERNING: Antibiotic IV to Oral Route Change Policy  RECOMMENDATION: This patient is receiving azithromycin by the intravenous route.  Based on criteria approved by the Pharmacy and Therapeutics Committee, the antibiotic(s) is/are being converted to the equivalent oral dose form(s).   DESCRIPTION: These criteria include:  Patient being treated for a respiratory tract infection, urinary tract infection, cellulitis or clostridium difficile associated diarrhea if on metronidazole  The patient is not neutropenic and does not exhibit a GI malabsorption state  The patient is eating (either orally or via tube) and/or has been taking other orally administered medications for a least 24 hours  The patient is improving clinically and has a Tmax < 100.5  If you have questions about this conversion, please contact the Pharmacy Department  []   [phone] )  [facility] []   [phone] )  [facility] []   [phone] )  [facility] []   [phone] )  [facility] [x]   [phone] )  [place], PharmD, BCPS 06/03/2015 9:35 AM

## 2015-06-03 NOTE — Care Management Note (Signed)
Case Management Note  Patient Details  Name: Brandy Crawford MRN: [id number] Date of Birth: 06/04/1923  Subjective/Objective:    Admitted with Sepsis due to pneumonia                 Action/Plan: Discharge planning per CSW  Expected Discharge Date:                  Expected Discharge Plan:  Skilled Nursing Facility  In-House Referral:  Clinical Social Work  Discharge planning Services  CM Consult  Post Acute Care Choice:  NA Choice offered to:  NA  DME Arranged:  N/A DME Agency:  NA  HH Arranged:  NA HH Agency:  NA  Status of Service:  Completed, signed off  Medicare Important Message Given:  Yes Date Medicare IM Given:    Medicare IM give by:    Date Additional Medicare IM Given:    Additional Medicare Important Message give by:     If discussed at [place] of Stay Meetings, dates discussed:    Additional Comments:  Guadalupe Maple, RN 06/03/2015, 9:54 AM [phone]

## 2015-06-03 NOTE — Discharge Summary (Addendum)
Physician Discharge Summary  Brandy Crawford [id number] DOB: 1924/01/29 DOA: 05/30/2015  PCP: Estill Dooms, MD  Admit date: 05/30/2015 Discharge date: 06/03/2015  Recommendations for Outpatient Follow-up:  1. Pt will need to follow up with PCP in 2-3 weeks post discharge 2. Please obtain BMP to evaluate electrolytes and kidney function 3. Please also check CBC to evaluate Hg and Hct levels 4. Complete therapy with Levaquin for 5 more days   Discharge Diagnoses:  Active Problems:   Sepsis due to pneumonia ([place])   Thrombocytosis ([place])   Essential hypertension   Acute hyponatremia  Discharge Condition: Stable  Diet recommendation: Heart healthy diet discussed in details    Brief narrative:    Pt is 82 yo female who presented to [facility] ED with main concern of one week duration of progressively worsening dyspnea that initially started with exertion and has progressed to dyspnea at rest in the past 48 hours. This has been associated with productive cough of yellow sputum, subjective fevers, chills, chest tightness with coughing spells. Pt reports she was treated with Moxifloxacin prior to this but her symptoms has not gotten much better and she presented to ED. She denies abd or urinary conerns, no focal neurological symptoms.   In ED, pt noted to be hemodynamically stable, VS notable for T 99.7F, HR up to 102 and RR up to 32 bpm. Initial oxygen saturation 87% on RA requiring placement on supplemental oxygen and TRH asked to admit for further evaluation.  Assessment/Plan:    Principal Problem:  Sepsis due to pneumonia ([place]) - pt met criteria for sepsis on admission, highest temp recorder in the system 101 F (per EMS report), WBC < 12K, RR > 22, HR > 90 - appears to be related to PNA even though this is not clearly evident on imaging studies but pt's symptoms and physical exam findings are highly suggestive of bronchitis vs PNA viral vs bacterial etiology  - pt continues to  improve, sepsis etiology resolved, pt to continue taking Levaquin to complete therapy for 5 more days   Active Problems:  Thrombocytosis ([place]) - likely reactive  - outpatient follow up recommended    Essential hypertension - continue home medical regimen    Acute hyponatremia - mild, encouraged PO intake - resolved   Code Status: DNR Family Communication: plan of care discussed with the patient and daughter at bedside  Disposition Plan: SNF   IV access:  Peripheral IV  Procedures and diagnostic studies:   Dg Chest 2 View 05/30/2015 No acute abnormality noted.   Ct Angio Chest Pe W/cm &/or Wo Cm 05/30/2015 : Severely limited exam by patient motion artifact. No definitive pulmonary embolism is seen. Bibasilar atelectatic changes.   Ct Thoracic Spine Wo Contrast 06/01/2015 No acute fracture or malalignment. 2. Levoconvex scoliosis of the thoracic spine. 3. Mild multilevel degenerative disc disease without focality.  Medical Consultants:  None  Other Consultants:  PT - SNF recommended   IAnti-Infectives:   Zithromax 2/17 --> 2/20 Rocephin 2/17 --> 2/20 Levaquin to complete therapy upon discharge       Discharge Exam: Filed Vitals:   06/03/15 0540 06/03/15 0900  BP: 131/54 140/62  Pulse:  88  Temp: 98.4 F (36.9 C) 99.3 F (37.4 C)  Resp: 16    Filed Vitals:   06/02/15 1342 06/02/15 2201 06/03/15 0540 06/03/15 0900  BP: 162/66 162/69 131/54 140/62  Pulse: 81 91  88  Temp: 97.4 F (36.3 C) 98.2 F (36.8 C) 98.4 F (  36.9 C) 99.3 F (37.4 C)  TempSrc: Axillary Oral Oral Axillary  Resp: _0 Height:      Weight:   70 kg (154 lb 5.2 oz)   SpO2: 100% 99% 97% 97%    General: Pt is alert, follows commands appropriately, not in acute distress Cardiovascular: Regular rate and rhythm, no rubs, no gallops Respiratory: Clear to auscultation bilaterally, no wheezing, no crackles, no rhonchi Abdominal: Soft, non tender, non distended,  bowel sounds +, no guarding  Discharge Instructions  Discharge Instructions    Diet - low sodium heart healthy    Complete by:  As directed      Increase activity slowly    Complete by:  As directed             Medication List    TAKE these medications        acetaminophen 325 MG tablet  Commonly known as:  TYLENOL  Take 650 mg by mouth every 4 (four) hours as needed for moderate pain or headache.     albuterol 108 (90 Base) MCG/ACT inhaler  Commonly known as:  PROVENTIL HFA;VENTOLIN HFA  Inhale 2 puffs into the lungs every 6 (six) hours as needed for wheezing or shortness of breath.     alum & mag hydroxide-simeth 200-200-20 MG/5ML suspension  Commonly known as:  MAALOX/MYLANTA  Take 30 mLs by mouth as needed for indigestion or heartburn.     amLODipine 5 MG tablet  Commonly known as:  NORVASC  Take 5 mg by mouth daily with breakfast.     cholestyramine 4 g packet  Commonly known as:  QUESTRAN  Take 4 g by mouth 2 (two) times daily.     ferrous sulfate 325 (65 FE) MG tablet  Take 325 mg by mouth daily.     levofloxacin 500 MG tablet  Commonly known as:  LEVAQUIN  Take 1 tablet (500 mg total) by mouth daily.     loperamide 2 MG capsule  Commonly known as:  IMODIUM  Take one to two tablets by mouth as needed for diarrhea     losartan 100 MG tablet  Commonly known as:  COZAAR  Take 100 mg by mouth daily with breakfast.     MYRBETRIQ 25 MG Tb24 tablet  Generic drug:  mirabegron ER  Take 25 mg by mouth daily with breakfast. Prescribed by urology     nitroGLYCERIN 0.4 MG SL tablet  Commonly known as:  NITROSTAT  Place 0.4 mg under the tongue every 5 (five) minutes as needed for chest pain.     omeprazole 20 MG capsule  Commonly known as:  PRILOSEC  Take 20 mg by mouth daily.     pyridOXINE 100 MG tablet  Commonly known as:  VITAMIN B-6  Take 100 mg by mouth daily with breakfast.     traMADol 50 MG tablet  Commonly known as:  ULTRAM  Take 50 mg by mouth  every 6 (six) hours as needed for moderate pain or severe pain.           Follow-up Information    Follow up with GREEN, Viviann Spare, MD.   Specialty:  Internal Medicine   Contact information:   [place] [address] [phone]        The results of significant diagnostics from this hospitalization (including imaging, microbiology, ancillary and laboratory) are listed below for reference.     Microbiology: Recent Results (from the past 240 hour(s))  Blood culture (routine x 2)     Status: None (Preliminary result)   Collection Time: 05/30/15 12:37 PM  Result Value Ref Range Status   Specimen Description BLOOD RIGHT HAND  Final   Special Requests BOTTLES DRAWN AEROBIC AND ANAEROBIC 5CC  Final   Culture   Final    NO GROWTH 3 DAYS Performed at [facility]    Report Status PENDING  Incomplete  Blood culture (routine x 2)     Status: None (Preliminary result)   Collection Time: 05/30/15  2:41 PM  Result Value Ref Range Status   Specimen Description BLOOD RIGHT ARM  Final   Special Requests BOTTLES DRAWN AEROBIC AND ANAEROBIC 5CC  Final   Culture   Final    NO GROWTH 3 DAYS Performed at [facility]    Report Status PENDING  Incomplete  MRSA PCR Screening     Status: None   Collection Time: 05/31/15  5:11 AM  Result Value Ref Range Status   MRSA by PCR NEGATIVE NEGATIVE Final    Comment:        The GeneXpert MRSA Assay (FDA approved for NASAL specimens only), is one component of a comprehensive MRSA colonization surveillance program. It is not intended to diagnose MRSA infection nor to guide or monitor treatment for MRSA infections.      Labs: Basic Metabolic Panel:  Recent Labs Lab 05/30/15 1248 05/31/15 1022 06/01/15 0552 06/02/15 0520 06/03/15 0444  NA 133*  --  139 135 135  K 3.7  --  3.5 3.8 3.9  CL 101  --  104 100* 102  CO2 23  --  _0 GLUCOSE 128*  --  109* 118* 117*  BUN 11  --  _1 CREATININE  0.65 0.62 0.50 0.67 0.60  CALCIUM 9.4  --  9.8 9.5 9.6   Liver Function Tests:  Recent Labs Lab 05/30/15 1248  AST 24  ALT 28  ALKPHOS 69  BILITOT 1.1  PROT 7.4  ALBUMIN 3.6   CBC:  Recent Labs Lab 05/30/15 1237 05/31/15 1022 06/01/15 0552 06/02/15 0520 06/03/15 0444  WBC 15.3* 12.1* 10.3 10.7* 9.2  NEUTROABS 12.4*  --   --   --   --   HGB 12.9 11.7* 12.9 12.3 12.1  HCT 38.3 33.4* 38.7 36.6 36.4  MCV 93.6 92.3 94.2 92.4 94.3  PLT 415* 377 456* 486* 535*   SIGNED: Time coordinating discharge: 30 minutes  MAGICK-Vickki Igou, MD  [facility] 06/03/2015, 9:37 AM Pager [phone]  If 7PM-7AM, please contact night-coverage [email or web address] Password [email or web address]

## 2015-06-03 NOTE — Progress Notes (Signed)
Pt for discharge to [facility] SNF.   CSW facilitated pt discharge needs including contacting facility, faxing pt discharge information via epic hub, discussing with pt and pt daughter at bedside, providing RN phone number to call report, and arranging ambulance transport for pt to [facility].  No further social work needs identified at this time.  CSW signing off.   Alison Murray, MSW, [place] Work [phone]

## 2015-06-03 NOTE — Progress Notes (Signed)
Report called to RN @ [facility].  Pt transported via stretcher.  Ultram 50 mg given prior to transfer.

## 2015-06-03 NOTE — Clinical Social Work Placement (Signed)
   CLINICAL SOCIAL WORK PLACEMENT  NOTE  Date:  06/03/2015  Patient Details  Name: Brandy Crawford MRN: [id number] Date of Birth: 06-22-1923  Clinical Social Work is seeking post-discharge placement for this patient at the [place] level of care (*CSW will initial, date and re-position this form in  chart as items are completed):  Yes (pt from [facility] and will d/c to [facility])   Patient/family provided with [place] Work Department's list of facilities offering this level of care within the geographic area requested by the patient (or if unable, by the patient's family).      Patient/family informed of their freedom to choose among providers that offer the needed level of care, that participate in Medicare, Medicaid or managed care program needed by the patient, have an available bed and are willing to accept the patient.      Patient/family informed of [facility]'s ownership interest in [facility] and [facility], as well as of the fact that they are under no obligation to receive care at these facilities.  PASRR submitted to EDS on 06/03/15     PASRR number received on 06/03/15     Existing PASRR number confirmed on       FL2 transmitted to all facilities in geographic area requested by pt/family on 06/03/15     FL2 transmitted to all facilities within larger geographic area on       Patient informed that his/her managed care company has contracts with or will negotiate with certain facilities, including the following:        Yes   Patient/family informed of bed offers received.  Patient chooses bed at [facility]     Physician recommends and patient chooses bed at      Patient to be transferred to [facility] on 06/03/15.  Patient to be transferred to facility by ambulance [facility])     Patient family notified on 06/03/15 of transfer.  Name of family member notified:  pt and pt daughter at  bedside     PHYSICIAN       Additional Comment:    _______________________________________________ Ladell Pier, LCSW 06/03/2015, 12:47 PM

## 2015-06-04 ENCOUNTER — Non-Acute Institutional Stay (SKILLED_NURSING_FACILITY): Payer: Medicare Other | Admitting: Nurse Practitioner

## 2015-06-04 ENCOUNTER — Encounter: Payer: Self-pay | Admitting: Nurse Practitioner

## 2015-06-04 ENCOUNTER — Non-Acute Institutional Stay (SKILLED_NURSING_FACILITY): Payer: Medicare Other | Admitting: Internal Medicine

## 2015-06-04 DIAGNOSIS — E871 Hypo-osmolality and hyponatremia: Secondary | ICD-10-CM | POA: Diagnosis not present

## 2015-06-04 DIAGNOSIS — J189 Pneumonia, unspecified organism: Secondary | ICD-10-CM

## 2015-06-04 DIAGNOSIS — D509 Iron deficiency anemia, unspecified: Secondary | ICD-10-CM | POA: Diagnosis not present

## 2015-06-04 DIAGNOSIS — D473 Essential (hemorrhagic) thrombocythemia: Secondary | ICD-10-CM | POA: Diagnosis not present

## 2015-06-04 DIAGNOSIS — A419 Sepsis, unspecified organism: Secondary | ICD-10-CM

## 2015-06-04 DIAGNOSIS — F5105 Insomnia due to other mental disorder: Secondary | ICD-10-CM

## 2015-06-04 DIAGNOSIS — F418 Other specified anxiety disorders: Secondary | ICD-10-CM | POA: Insufficient documentation

## 2015-06-04 DIAGNOSIS — G8929 Other chronic pain: Secondary | ICD-10-CM | POA: Diagnosis not present

## 2015-06-04 DIAGNOSIS — D75839 Thrombocytosis, unspecified: Secondary | ICD-10-CM

## 2015-06-04 DIAGNOSIS — M545 Low back pain, unspecified: Secondary | ICD-10-CM

## 2015-06-04 DIAGNOSIS — F4323 Adjustment disorder with mixed anxiety and depressed mood: Secondary | ICD-10-CM | POA: Diagnosis not present

## 2015-06-04 DIAGNOSIS — N318 Other neuromuscular dysfunction of bladder: Secondary | ICD-10-CM | POA: Diagnosis not present

## 2015-06-04 DIAGNOSIS — K219 Gastro-esophageal reflux disease without esophagitis: Secondary | ICD-10-CM | POA: Diagnosis not present

## 2015-06-04 DIAGNOSIS — R5381 Other malaise: Secondary | ICD-10-CM

## 2015-06-04 DIAGNOSIS — I10 Essential (primary) hypertension: Secondary | ICD-10-CM

## 2015-06-04 HISTORY — DX: Adjustment disorder with mixed anxiety and depressed mood: F43.23

## 2015-06-04 HISTORY — DX: Other chronic pain: G89.29

## 2015-06-04 HISTORY — DX: Low back pain, unspecified: M54.50

## 2015-06-04 LAB — CULTURE, BLOOD (ROUTINE X 2)
CULTURE: NO GROWTH
Culture: NO GROWTH

## 2015-06-04 NOTE — Assessment & Plan Note (Signed)
Saw Urology 06/06/14-continued with Myrbetriq.  Stress incontinent in natures. 3-4x/night

## 2015-06-04 NOTE — Progress Notes (Signed)
Patient ID: Brandy Crawford, female   DOB: 08/19/1923, 82 y.o.   MRN: [id number]  Location:  [facility] Room Number: n 65 Place of Service:  SNF (31) Provider: Lennie Odor Chelbi Herber NP  GREEN, Viviann Spare, MD  Patient Care Team: Estill Dooms, MD as PCP - General (Internal Medicine) Congetta Odriscoll X, NP as Nurse Practitioner (Nurse Practitioner)  Extended Emergency Contact Information Primary Emergency Contact: [name] Address: [place], [place] [address] [name] of [place] Phone: [phone] Work Phone: [phone] Mobile Phone: [phone] Relation: Daughter Secondary Emergency Contact: [name] Address: [address] [place] [address] [name] of [place] Phone: [phone] Mobile Phone: [phone] Relation: Son  Code Status:  DNR  Goals of care: Advanced Directive information Advanced Directives 06/04/2015  Does patient have an advance directive? Yes  Type of Paramedic of [place];Living will;Out of facility DNR (pink MOST or yellow form)  Does patient want to make changes to advanced directive? No - Patient declined  Copy of advanced directive(s) in chart? Yes     Chief Complaint  Patient presents with  . Hospitalization Follow-up    HPI:  Pt is a 82 y.o. female seen today for a hospital f/u s/p admission from [place]. 05/30/15 to 06/03/15 hospitalized for Sepsis due to pneumonia [facility]), pt continues to improve, sepsis etiology resolved, pt to continue taking Levaquin to complete therapy for 5 more days. Staff reported the patient c/o back pain, hx of spinal stenosis, recently has been migratory from lower back, left hip, left leg, neck, right shoulder blade, and now mid to lower back more on the right side. Tramadol and Tylenol prn are not adequate. She is anxious and restless at times.   Past Medical History  Diagnosis Date  . Asthma   . GERD  (gastroesophageal reflux disease)   . Hypertension   . TIA (transient ischemic attack) 09/20/2006  . Cervical disc disease   . Anemia   . Depression   . Lumbosacral spondylosis 10/28/2009  . Abnormality of gait 04/28/2010    History of fall onto right shoulder-has participated in PT. Uses walker.     . Acute lower GI bleeding 10/30/2014  . Fall at nursing home 10/25/2014  . History of cardiovascular disorder 09/20/2006    X 3 in 2000 now on aspirin.    Marland Kitchen Hyperlipidemia 09/20/2006    05/15/14 LDL 95   . OSTEOPOROSIS 09/20/2006    Noted 2008    . Overactive bladder 11/29/2013    Myrbetriq prescribed by urology.    . SCOLIOSIS 10/28/2009  . Diarrhea 10/25/14  . Acute encephalopathy 10/25/14    Occurred when septic  . Diverticulosis 10/27/2014  . Cerebrovascular disease 10/27/2014    Small vessel disease  . Cerebral atrophy 10/27/2014  . Aortic atherosclerosis ([place]) 10/27/2014  . Degenerative disc disease, lumbar 10/27/2014  . Scoliosis 10/27/2014   Past Surgical History  Procedure Laterality Date  . Appendectomy    . Abdominal hysterectomy    . Tonsillectomy    . Cholecystectomy      Allergies  Allergen Reactions  . Tramadol Other (See Comments)    Hallucinations, agitation/combativeness  . Amoxicillin     Per MAR  . Oxycodone Other (See Comments)    Per MAR   . Sulfamethoxazole     Per MAR   .  Penicillins Rash      Medication List       This list is accurate as of: 06/04/15 12:57 PM.  Always use your most recent med list.               acetaminophen 325 MG tablet  Commonly known as:  TYLENOL  Take 650 mg by mouth every 4 (four) hours as needed for moderate pain or headache.     albuterol 108 (90 Base) MCG/ACT inhaler  Commonly known as:  PROVENTIL HFA;VENTOLIN HFA  Inhale 2 puffs into the lungs every 6 (six) hours as needed for wheezing or shortness of breath.     alum & mag hydroxide-simeth 200-200-20 MG/5ML suspension  Commonly known as:  MAALOX/MYLANTA  Take 30  mLs by mouth as needed for indigestion or heartburn.     amLODipine 5 MG tablet  Commonly known as:  NORVASC  Take 5 mg by mouth daily with breakfast.     cholestyramine 4 g packet  Commonly known as:  QUESTRAN  Take 4 g by mouth 2 (two) times daily.     ferrous sulfate 325 (65 FE) MG tablet  Take 325 mg by mouth daily.     levofloxacin 500 MG tablet  Commonly known as:  LEVAQUIN  Take 1 tablet (500 mg total) by mouth daily.     loperamide 2 MG capsule  Commonly known as:  IMODIUM  Take one to two tablets by mouth as needed for diarrhea     losartan 100 MG tablet  Commonly known as:  COZAAR  Take 100 mg by mouth daily with breakfast.     MYRBETRIQ 25 MG Tb24 tablet  Generic drug:  mirabegron ER  Take 25 mg by mouth daily with breakfast. Prescribed by urology     nitroGLYCERIN 0.4 MG SL tablet  Commonly known as:  NITROSTAT  Place 0.4 mg under the tongue every 5 (five) minutes as needed for chest pain.     omeprazole 20 MG capsule  Commonly known as:  PRILOSEC  Take 20 mg by mouth daily.     pyridOXINE 100 MG tablet  Commonly known as:  VITAMIN B-6  Take 100 mg by mouth daily with breakfast.     traMADol 50 MG tablet  Commonly known as:  ULTRAM  Take 1 tablet (50 mg total) by mouth every 6 (six) hours as needed for moderate pain or severe pain.        Review of Systems  Constitutional: Negative for fever, chills and diaphoresis.  HENT: Positive for hearing loss. Negative for congestion, ear discharge, ear pain, nosebleeds, sore throat and tinnitus.   Eyes: Negative for photophobia, pain, discharge and redness.  Respiratory: Positive for cough. Negative for shortness of breath, wheezing and stridor.        Resolved congestive cough, yellow phlegm   Cardiovascular: Negative for chest pain, palpitations and leg swelling.  Gastrointestinal: Negative for nausea, vomiting, abdominal pain, diarrhea (Under control with Questran and Imodium), constipation and blood in  stool.  Endocrine: Negative for polydipsia.  Genitourinary: Positive for frequency. Negative for dysuria, urgency, hematuria and flank pain.       1-2x/night. Stress incontinence  Musculoskeletal: Positive for back pain. Negative for myalgias and neck pain.       Left lower back, left hip, left thigh pain with weight bearing and position changes-resolved Chest pain was relieved after Mylanta 05/28/15 Neck pain 05/29/15 Right upper back pin 05/30/15  Skin: Negative.  Negative for rash.  Allergic/Immunologic: Negative for environmental allergies.  Neurological: Negative for dizziness, tremors, seizures, weakness and headaches.       Memory loss. Patient had delirium when septic.  Hematological: Does not bruise/bleed easily.       Anemic secondary to acute blood loss from lower GI bleeding  Psychiatric/Behavioral: Positive for confusion and agitation. Negative for suicidal ideas and hallucinations. The patient is nervous/anxious.     Immunization History  Administered Date(s) Administered  . HiB (PRP-OMP) 02/12/2004  . Influenza Split 01/11/2012  . Influenza Whole 01/07/2009  . Influenza-Unspecified 01/11/2014, 01/01/2015  . PPD Test 11/14/2014  . Pneumococcal Polysaccharide-23 04/28/2010  . Tdap 08/24/2011  . Zoster 04/13/2010   Pertinent  Health Maintenance Due  Topic Date Due  . DEXA SCAN  01/28/1991  . PNA vac Low Risk Adult (2 of 2 - PCV13) 04/29/2011  . INFLUENZA VACCINE  11/12/2015   Fall Risk  05/23/2015 11/09/2014 05/10/2014  Falls in the past year? No No No   Functional Status Survey:    Filed Vitals:   06/04/15 1216  BP: 164/74  Pulse: 90  Temp: 100.3 F (37.9 C)  TempSrc: Oral  Resp: 18  SpO2: 94%   There is no weight on file to calculate BMI. Physical Exam  Constitutional: She is oriented to person, place, and time. She appears well-developed and well-nourished. No distress.  HENT:  Head: Normocephalic and atraumatic.  Right Ear: No lacerations. No drainage.  No foreign bodies. No mastoid tenderness. Tympanic membrane is not injected, not scarred, not perforated, not erythematous, not retracted and not bulging. Tympanic membrane mobility is abnormal. No middle ear effusion. Decreased hearing is noted.  Left Ear: No lacerations. No drainage. No foreign bodies. No mastoid tenderness. Tympanic membrane is not injected, not scarred, not perforated, not erythematous, not retracted and not bulging. Tympanic membrane mobility is abnormal.  No middle ear effusion. Decreased hearing is noted.  Nose: Nose normal.  Mouth/Throat: Oropharynx is clear and moist. No oropharyngeal exudate.  Eyes: Conjunctivae and EOM are normal. Pupils are equal, round, and reactive to light. Right eye exhibits no discharge. Left eye exhibits no discharge. No scleral icterus.  Neck: Normal range of motion. Neck supple. No JVD present. No tracheal deviation present. No thyromegaly present.  Cardiovascular: Normal rate, regular rhythm, normal heart sounds and intact distal pulses.  Exam reveals no gallop and no friction rub.   No murmur heard. Pulmonary/Chest: Effort normal. No stridor. No respiratory distress. She has no wheezes. She has no rales. She exhibits no tenderness.  Coarse rales posterior mid to lower lungs.  Abdominal: Soft. Bowel sounds are normal. She exhibits no distension and no mass. There is no tenderness. There is no rebound and no guarding.  Genitourinary: Guaiac negative stool.  Musculoskeletal: She exhibits no edema or tenderness.  Left lower back, left hip, left thigh pain with weight bearing and position changes-resolved Chest pain was relieved after Mylanta 05/28/15 Neck pain 05/29/15 Right upper back pin 05/30/15 06/04/15 mid to lower back pain, more on the right side  Lymphadenopathy:    She has no cervical adenopathy.  Neurological: She is alert and oriented to person, place, and time. She has normal reflexes. No cranial nerve deficit. She exhibits normal muscle  tone. Coordination normal.  Skin: Skin is warm and dry. No rash noted. She is not diaphoretic. No erythema. No pallor.  Lots of moles.   Psychiatric: Her behavior is normal. Thought content normal.    Labs reviewed:  Recent Labs  06/01/15  7903 06/02/15 0520 06/03/15 0444  NA 139 135 135  K 3.5 3.8 3.9  CL 104 100* 102  CO2 _0 GLUCOSE 109* 118* 117*  BUN _1 CREATININE 0.50 0.67 0.60  CALCIUM 9.8 9.5 9.6    Recent Labs  10/25/14 0714 05/14/15 05/30/15 1248  AST _2 ALT _3 ALKPHOS 55 59 69  BILITOT 0.9  --  1.1  PROT 6.4*  --  7.4  ALBUMIN 3.7  --  3.6    Recent Labs  10/25/14 0714  05/30/15 1237  06/01/15 0552 06/02/15 0520 06/03/15 0444  WBC 20.2*  < > 15.3*  < > 10.3 10.7* 9.2  NEUTROABS 19.1*  --  12.4*  --   --   --   --   HGB 11.0*  < > 12.9  < > 12.9 12.3 12.1  HCT 32.7*  < > 38.3  < > 38.7 36.6 36.4  MCV 92.1  < > 93.6  < > 94.2 92.4 94.3  PLT 333  < > 415*  < > 456* 486* 535*  < > = values in this interval not displayed. Lab Results  Component Value Date   TSH 2.170 10/29/2014   Lab Results  Component Value Date   HGBA1C 5.9 08/24/2011   Lab Results  Component Value Date   CHOL 176 05/15/2014   HDL 62 05/15/2014   LDLCALC 95 05/15/2014   LDLDIRECT 114.3 12/08/2007   TRIG 93 05/15/2014   CHOLHDL 2.9 CALC 12/08/2007    Significant Diagnostic Results in last 30 days:  Dg Chest 2 View  05/30/2015  CLINICAL DATA:  Productive cough and chest pain EXAM: CHEST  2 VIEW COMPARISON:  10/25/2014 FINDINGS: Cardiac shadow is at the upper limits of normal in size but stable. Aortic calcifications are again seen. The lungs are well aerated bilaterally. No focal infiltrate or sizable effusion is seen. Degenerative changes of thoracic spine are noted. IMPRESSION: No acute abnormality noted. Electronically Signed   By: Inez Catalina M.D.   On: 05/30/2015 13:00   Ct Angio Chest Pe W/cm &/or Wo Cm  05/30/2015  CLINICAL DATA:  Recent  history of pneumonia with right-sided chest pain, initial encounter EXAM: CT ANGIOGRAPHY CHEST WITH CONTRAST TECHNIQUE: Multidetector CT imaging of the chest was performed using the standard protocol during bolus administration of intravenous contrast. Multiplanar CT image reconstructions and MIPs were obtained to evaluate the vascular anatomy. CONTRAST:  174m OMNIPAQUE IOHEXOL 350 MG/ML SOLN COMPARISON:  01/13/2014 FINDINGS: Lungs are well aerated bilaterally but demonstrate minimal atelectatic changes bilaterally in the lower lobes. No focal infiltrate or sizable effusion is seen. No definitive parenchymal nodule is noted. Inflammatory changes seen on prior examination are not well appreciated on this exam. The thoracic inlet is within normal limits. The thoracic aorta and its branches demonstrate calcifications although no aneurysmal dilatation or dissection is seen. Pulmonary artery demonstrates a normal branching pattern. Considerable respiratory motion limits evaluation of the distal branches. No definitive pulmonary embolism is seen. The visualized upper abdomen is within normal limits. The bony structures show degenerative change of thoracic spine. No definitive acute bony abnormality is seen. Review of the MIP images confirms the above findings. IMPRESSION: Severely limited exam by patient motion artifact. No definitive pulmonary embolism is seen. Bibasilar atelectatic changes. Electronically Signed   By: MInez CatalinaM.D.   On: 05/30/2015 16:45    Assessment/Plan [place]epsis [place] - pt met  criteria for sepsis on admission, highest temp recorder in the system 101 F (per EMS report), WBC < 12K, RR > 22, HR > 90 - appears to be related to PNA even though this is not clearly evident on imaging studies but pt's symptoms and physical exam findings are highly suggestive of bronchitis vs PNA viral vs bacterial etiology  - pt continues to improve, sepsis etiology resolved, pt to continue taking  Levaquin to complete therapy for 5 more days    Essential hypertension Controlled, continue Amlodipine 7m, Losartan 1058m  Acute hyponatremia 06/02/14 Na 135, update BMP  Adjustment disorder with mixed anxiety and depressed mood In setting of migratory aches and pains, will try Cymbalta 2015maily, observe the patient.    IDA (iron deficiency anemia) Hgb 12.1 06/03/15, continue Fe, update CBC  GERD (gastroesophageal reflux disease) Complained chest pain 05/29/15, relieved after Mylanta 30ccx1, continue Omeprazole   Hypertonicity of bladder Saw Urology 06/06/14-continued with Myrbetriq.  Stress incontinent in natures. 3-4x/night  Chronic lower back pain Including lumbar spondylosis. Regular f/u Dr. CafFrench Anaeceives injections last 01/2013.  05/27/15 Xray left hip/pelvis: spondylotic changes, severe osteoarthritis Continue Tylenol and Tramadol prn for pain, observe the patient, adding Cymbalta may help. Mid to lower back pain more on the right side back, may consider X-ray of thoracic spine. 06/01/15 CT thoracic spine showed no acute changes.     Family/ staff Communication: managing pain, PT for strengthening, ambulation, therapeutic exercise, goal is AL when able.   Labs/tests ordered: CBC, BMP

## 2015-06-04 NOTE — Assessment & Plan Note (Signed)
Controlled, continue Amlodipine 5mg , Losartan 100mg ,

## 2015-06-04 NOTE — Assessment & Plan Note (Signed)
Hgb 12.1 06/03/15, continue Fe, update CBC

## 2015-06-04 NOTE — Assessment & Plan Note (Signed)
In setting of migratory aches and pains, will try Cymbalta 20mg  daily, observe the patient.

## 2015-06-04 NOTE — Assessment & Plan Note (Addendum)
Including lumbar spondylosis. Regular f/u Dr. French Ana. Receives injections last 01/2013.  05/27/15 Xray left hip/pelvis: spondylotic changes, severe osteoarthritis Continue Tylenol and Tramadol prn for pain, observe the patient, adding Cymbalta may help. Mid to lower back pain more on the right side back, may consider X-ray of thoracic spine. 06/01/15 CT thoracic spine showed no acute changes.

## 2015-06-04 NOTE — Assessment & Plan Note (Signed)
Complained chest pain 05/29/15, relieved after Mylanta 30ccx1, continue Omeprazole

## 2015-06-04 NOTE — Assessment & Plan Note (Signed)
-  pt met criteria for sepsis on admission, highest temp recorder in the system 101 F (per EMS report), WBC < 12K, RR > 22, HR > 90 - appears to be related to PNA even though this is not clearly evident on imaging studies but pt's symptoms and physical exam findings are highly suggestive of bronchitis vs PNA viral vs bacterial etiology  - pt continues to improve, sepsis etiology resolved, pt to continue taking Levaquin to complete therapy for 5 more days

## 2015-06-04 NOTE — Assessment & Plan Note (Signed)
06/02/14 Na 135, update BMP

## 2015-06-06 LAB — HEPATIC FUNCTION PANEL
ALT: 19 U/L (ref 7–35)
AST: 16 U/L (ref 13–35)
Alkaline Phosphatase: 71 U/L (ref 25–125)
BILIRUBIN, TOTAL: 0.5 mg/dL

## 2015-06-06 LAB — BASIC METABOLIC PANEL
BUN: 19 mg/dL (ref 4–21)
CREATININE: 0.8 mg/dL (ref 0.5–1.1)
GLUCOSE: 102 mg/dL

## 2015-06-06 LAB — CBC AND DIFFERENTIAL
HCT: 37 % (ref 36–46)
Hemoglobin: 12.4 g/dL (ref 12.0–16.0)
PLATELETS: 565 10*3/uL — AB (ref 150–399)
WBC: 8.4 10*3/mL

## 2015-06-07 ENCOUNTER — Encounter: Payer: Self-pay | Admitting: Nurse Practitioner

## 2015-06-07 DIAGNOSIS — E46 Unspecified protein-calorie malnutrition: Secondary | ICD-10-CM | POA: Insufficient documentation

## 2015-06-10 LAB — BASIC METABOLIC PANEL
BUN: 10 mg/dL (ref 4–21)
CREATININE: 0.6 mg/dL (ref 0.5–1.1)
Glucose: 91 mg/dL
Potassium: 4.5 mmol/L (ref 3.4–5.3)
SODIUM: 139 mmol/L (ref 137–147)

## 2015-06-10 LAB — CBC AND DIFFERENTIAL
HCT: 39 % (ref 36–46)
Hemoglobin: 13.1 g/dL (ref 12.0–16.0)
Platelets: 602 10*3/uL — AB (ref 150–399)
WBC: 7.3 10^3/mL

## 2015-06-20 ENCOUNTER — Encounter: Payer: Self-pay | Admitting: Internal Medicine

## 2015-06-20 ENCOUNTER — Non-Acute Institutional Stay (SKILLED_NURSING_FACILITY): Payer: Medicare Other | Admitting: Internal Medicine

## 2015-06-20 DIAGNOSIS — R5381 Other malaise: Secondary | ICD-10-CM

## 2015-06-20 DIAGNOSIS — J189 Pneumonia, unspecified organism: Secondary | ICD-10-CM

## 2015-06-20 DIAGNOSIS — M545 Low back pain: Secondary | ICD-10-CM | POA: Diagnosis not present

## 2015-06-20 DIAGNOSIS — G8929 Other chronic pain: Secondary | ICD-10-CM

## 2015-06-20 DIAGNOSIS — D473 Essential (hemorrhagic) thrombocythemia: Secondary | ICD-10-CM

## 2015-06-20 DIAGNOSIS — F4323 Adjustment disorder with mixed anxiety and depressed mood: Secondary | ICD-10-CM

## 2015-06-20 DIAGNOSIS — A419 Sepsis, unspecified organism: Secondary | ICD-10-CM | POA: Diagnosis not present

## 2015-06-20 DIAGNOSIS — I10 Essential (primary) hypertension: Secondary | ICD-10-CM | POA: Diagnosis not present

## 2015-06-20 DIAGNOSIS — D75839 Thrombocytosis, unspecified: Secondary | ICD-10-CM

## 2015-06-20 HISTORY — DX: Other malaise: R53.81

## 2015-06-20 NOTE — Progress Notes (Signed)
Location:  [facility] Room Number: N-34 Place of Service:  SNF (31) Provider:  Estill Dooms, MD  Patient Care Team: Estill Dooms, MD as PCP - General (Internal Medicine) Man [name] Rhunette Croft, NP as Nurse Practitioner (Nurse Practitioner)  Extended Emergency Contact Information Primary Emergency Contact: [name] Address: [place], [place] [address] [name] of [place] Phone: [phone] Work Phone: [phone] Mobile Phone: [phone] Relation: Daughter Secondary Emergency Contact: [name] Address: [address] [place] [address] [name] of [place] Phone: [phone] Mobile Phone: [phone] Relation: Son  Code Status:  DNR Goals of care: Advanced Directive information Advanced Directives 06/20/2015  Does patient have an advance directive? Yes  Type of Paramedic of [place];Living will;Out of facility DNR (pink MOST or yellow form)  Does patient want to make changes to advanced directive? No - Patient declined  Copy of advanced directive(s) in chart? Yes     Chief Complaint  Patient presents with  . Medical Management of Chronic Issues    HPI:  Pt is a 82 y.o. female seen today for medical management of chronic diseases.  She was admitted to skilled nursing facility about a month ago. This was following a hospitalization for sepsis and pneumonia. She was quite weak at that time. She has gained strength but continues to depend on staff to help with standing. She is able to get in and out of the bathroom by using grab bars. Overall she is feeling well and her appetite has improved.  Chronic lower back pain - continues with some low back discomfort which creates problem with her mobility  Debility - slowly improving  Sepsis due to pneumonia ([place]) - resolved  Thrombocytosis ([place]) - last platelet count [phone]  Essential hypertension -  controlled  Adjustment disorder with mixed anxiety and depressed mood - stable with improvement in mood overall     Past Medical History  Diagnosis Date  . Asthma   . GERD (gastroesophageal reflux disease)   . Hypertension   . TIA (transient ischemic attack) 09/20/2006  . Cervical disc disease   . Anemia   . Depression   . Lumbosacral spondylosis 10/28/2009  . Abnormality of gait 04/28/2010    History of fall onto right shoulder-has participated in PT. Uses walker.     . Acute lower GI bleeding 10/30/2014  . Fall at nursing home 10/25/2014  . History of cardiovascular disorder 09/20/2006    X 3 in 2000 now on aspirin.    Marland Kitchen Hyperlipidemia 09/20/2006    05/15/14 LDL 95   . OSTEOPOROSIS 09/20/2006    Noted 2008    . Overactive bladder 11/29/2013    Myrbetriq prescribed by urology.    . SCOLIOSIS 10/28/2009  . Diarrhea 10/25/14  . Acute encephalopathy 10/25/14    Occurred when septic  . Diverticulosis 10/27/2014  . Cerebrovascular disease 10/27/2014    Small vessel disease  . Cerebral atrophy 10/27/2014  . Aortic atherosclerosis ([place]) 10/27/2014  . Degenerative disc disease, lumbar 10/27/2014  . Scoliosis 10/27/2014  . Protein calorie malnutrition ([place])     06/06/15 TP 6.0 albumin 2.9   Past Surgical History  Procedure Laterality Date  . Appendectomy    . Abdominal hysterectomy    . Tonsillectomy    . Cholecystectomy      Allergies  Allergen Reactions  .  Tramadol Other (See Comments)    Hallucinations, agitation/combativeness  . Amoxicillin     Per MAR  . Oxycodone Other (See Comments)    Per MAR   . Sulfamethoxazole     Per MAR   . Penicillins Rash      Medication List       This list is accurate as of: 06/20/15  3:15 PM.  Always use your most recent med list.               acetaminophen 325 MG tablet  Commonly known as:  TYLENOL  Take 650 mg by mouth every 4 (four) hours as needed for moderate pain or headache.     albuterol 108 (90 Base) MCG/ACT inhaler    Commonly known as:  PROVENTIL HFA;VENTOLIN HFA  Inhale 2 puffs into the lungs every 6 (six) hours as needed for wheezing or shortness of breath.     alum & mag hydroxide-simeth 200-200-20 MG/5ML suspension  Commonly known as:  MAALOX/MYLANTA  Take 30 mLs by mouth as needed for indigestion or heartburn.     amLODipine 5 MG tablet  Commonly known as:  NORVASC  Take 5 mg by mouth daily with breakfast.     cholestyramine 4 g packet  Commonly known as:  QUESTRAN  Take 4 g by mouth 2 (two) times daily.     ferrous sulfate 325 (65 FE) MG tablet  Take 325 mg by mouth daily.     loperamide 2 MG capsule  Commonly known as:  IMODIUM  Take one to two tablets by mouth as needed for diarrhea     losartan 100 MG tablet  Commonly known as:  COZAAR  Take 100 mg by mouth daily with breakfast.     MYRBETRIQ 25 MG Tb24 tablet  Generic drug:  mirabegron ER  Take 25 mg by mouth daily with breakfast. Prescribed by urology     nitroGLYCERIN 0.4 MG SL tablet  Commonly known as:  NITROSTAT  Place 0.4 mg under the tongue every 5 (five) minutes as needed for chest pain.     omeprazole 20 MG capsule  Commonly known as:  PRILOSEC  Take 20 mg by mouth daily.     pyridOXINE 100 MG tablet  Commonly known as:  VITAMIN B-6  Take 100 mg by mouth daily with breakfast.     traMADol 50 MG tablet  Commonly known as:  ULTRAM  Take 1 tablet (50 mg total) by mouth every 6 (six) hours as needed for moderate pain or severe pain.        Review of Systems  Constitutional: Negative for fever, chills and diaphoresis.  HENT: Positive for hearing loss. Negative for congestion, ear discharge, ear pain, nosebleeds, sore throat and tinnitus.   Eyes: Negative for photophobia, pain, discharge and redness.  Respiratory: Positive for cough. Negative for shortness of breath, wheezing and stridor.        Resolved congestive cough, yellow phlegm   Cardiovascular: Negative for chest pain, palpitations and leg swelling.   Gastrointestinal: Negative for nausea, vomiting, abdominal pain, diarrhea (Under control with Questran and Imodium), constipation and blood in stool.  Endocrine: Negative for polydipsia.  Genitourinary: Positive for frequency. Negative for dysuria, urgency, hematuria and flank pain.       1-2x/night. Stress incontinence  Musculoskeletal: Positive for back pain. Negative for myalgias and neck pain.       Left lower back, left hip, left thigh pain with weight bearing and position changes-resolved Chest pain  was relieved after Mylanta 05/28/15 Neck pain 05/29/15 Right upper back pin 05/30/15  Skin: Negative.  Negative for rash.  Allergic/Immunologic: Negative for environmental allergies.  Neurological: Negative for dizziness, tremors, seizures, weakness and headaches.       Memory loss. Patient had delirium when septic.  Hematological: Does not bruise/bleed easily.       Anemic secondary to acute blood loss from lower GI bleeding  Psychiatric/Behavioral: Positive for confusion and agitation. Negative for suicidal ideas and hallucinations. The patient is nervous/anxious.     Immunization History  Administered Date(s) Administered  . HiB (PRP-OMP) 02/12/2004  . Influenza Split 01/11/2012  . Influenza Whole 01/07/2009  . Influenza-Unspecified 01/11/2014, 01/01/2015  . PPD Test 11/14/2014  . Pneumococcal Polysaccharide-23 04/28/2010  . Tdap 08/24/2011  . Zoster 04/13/2010   Pertinent  Health Maintenance Due  Topic Date Due  . DEXA SCAN  01/28/1991  . PNA vac Low Risk Adult (2 of 2 - PCV13) 04/29/2011  . INFLUENZA VACCINE  11/12/2015   Fall Risk  05/23/2015 11/09/2014 05/10/2014  Falls in the past year? No No No   Functional Status Survey:    Filed Vitals:   06/20/15 1507  BP: 114/55  Pulse: 82  Temp: 97 F (36.1 C)  TempSrc: Oral  Resp: 18   There is no weight on file to calculate BMI. Physical Exam  Constitutional: She is oriented to person, place, and time. She appears  well-developed and well-nourished. No distress.  HENT:  Head: Normocephalic and atraumatic.  Right Ear: No lacerations. No drainage. No foreign bodies. No mastoid tenderness. Tympanic membrane is not injected, not scarred, not perforated, not erythematous, not retracted and not bulging. Tympanic membrane mobility is abnormal. No middle ear effusion. Decreased hearing is noted.  Left Ear: No lacerations. No drainage. No foreign bodies. No mastoid tenderness. Tympanic membrane is not injected, not scarred, not perforated, not erythematous, not retracted and not bulging. Tympanic membrane mobility is abnormal.  No middle ear effusion. Decreased hearing is noted.  Nose: Nose normal.  Mouth/Throat: Oropharynx is clear and moist. No oropharyngeal exudate.  Eyes: Conjunctivae and EOM are normal. Pupils are equal, round, and reactive to light. Right eye exhibits no discharge. Left eye exhibits no discharge. No scleral icterus.  Neck: Normal range of motion. Neck supple. No JVD present. No tracheal deviation present. No thyromegaly present.  Cardiovascular: Normal rate, regular rhythm, normal heart sounds and intact distal pulses.  Exam reveals no gallop and no friction rub.   No murmur heard. Pulmonary/Chest: Effort normal. No stridor. No respiratory distress. She has no wheezes. She has no rales. She exhibits no tenderness.  Coarse rales posterior mid to lower lungs.  Abdominal: Soft. Bowel sounds are normal. She exhibits no distension and no mass. There is no tenderness. There is no rebound and no guarding.  Genitourinary: Guaiac negative stool.  Musculoskeletal: She exhibits no edema or tenderness.  Left lower back, left hip, left thigh pain with weight bearing and position changes-resolved Chest pain was relieved after Mylanta 05/28/15 Neck pain 05/29/15 Right upper back pin 05/30/15 06/04/15 mid to lower back pain, more on the right side  Lymphadenopathy:    She has no cervical adenopathy.    Neurological: She is alert and oriented to person, place, and time. She has normal reflexes. No cranial nerve deficit. She exhibits normal muscle tone. Coordination normal.  Skin: Skin is warm and dry. No rash noted. She is not diaphoretic. No erythema. No pallor.  Lots of moles.  Psychiatric: Her behavior is normal. Thought content normal.    Labs reviewed:  Recent Labs  06/01/15 0552 06/02/15 0520 06/03/15 0444 06/06/15 06/10/15  NA 139 135 135  --  139  K 3.5 3.8 3.9  --  4.5  CL 104 100* 102  --   --   CO2 26 27 25   --   --   GLUCOSE 109* 118* 117*  --   --   BUN 9 16 14 19 10   CREATININE 0.50 0.67 0.60 0.8 0.6  CALCIUM 9.8 9.5 9.6  --   --     Recent Labs  10/25/14 0714 05/14/15 05/30/15 1248 06/06/15  AST 23 16 24 16   ALT 22 15 28 19   ALKPHOS 55 59 69 71  BILITOT 0.9  --  1.1  --   PROT 6.4*  --  7.4  --   ALBUMIN 3.7  --  3.6  --     Recent Labs  10/25/14 0714  05/30/15 1237  06/01/15 0552 06/02/15 0520 06/03/15 0444 06/06/15 06/10/15  WBC 20.2*  < > 15.3*  < > 10.3 10.7* 9.2 8.4 7.3  NEUTROABS 19.1*  --  12.4*  --   --   --   --   --   --   HGB 11.0*  < > 12.9  < > 12.9 12.3 12.1 12.4 13.1  HCT 32.7*  < > 38.3  < > 38.7 36.6 36.4 37 39  MCV 92.1  < > 93.6  < > 94.2 92.4 94.3  --   --   PLT 333  < > 415*  < > 456* 486* 535* 565* 602*  < > = values in this interval not displayed. Lab Results  Component Value Date   TSH 2.170 10/29/2014   Lab Results  Component Value Date   HGBA1C 5.9 08/24/2011   Lab Results  Component Value Date   CHOL 176 05/15/2014   HDL 62 05/15/2014   LDLCALC 95 05/15/2014   LDLDIRECT 114.3 12/08/2007   TRIG 93 05/15/2014   CHOLHDL 2.9 CALC 12/08/2007    Significant Diagnostic Results in last 30 days:  Dg Chest 2 View  05/30/2015  CLINICAL DATA:  Productive cough and chest pain EXAM: CHEST  2 VIEW COMPARISON:  10/25/2014 FINDINGS: Cardiac shadow is at the upper limits of normal in size but stable. Aortic  calcifications are again seen. The lungs are well aerated bilaterally. No focal infiltrate or sizable effusion is seen. Degenerative changes of thoracic spine are noted. IMPRESSION: No acute abnormality noted. Electronically Signed   By: Inez Catalina M.D.   On: 05/30/2015 13:00   Ct Angio Chest Pe W/cm &/or Wo Cm  05/30/2015  CLINICAL DATA:  Recent history of pneumonia with right-sided chest pain, initial encounter EXAM: CT ANGIOGRAPHY CHEST WITH CONTRAST TECHNIQUE: Multidetector CT imaging of the chest was performed using the standard protocol during bolus administration of intravenous contrast. Multiplanar CT image reconstructions and MIPs were obtained to evaluate the vascular anatomy. CONTRAST:  156mL OMNIPAQUE IOHEXOL 350 MG/ML SOLN COMPARISON:  01/13/2014 FINDINGS: Lungs are well aerated bilaterally but demonstrate minimal atelectatic changes bilaterally in the lower lobes. No focal infiltrate or sizable effusion is seen. No definitive parenchymal nodule is noted. Inflammatory changes seen on prior examination are not well appreciated on this exam. The thoracic inlet is within normal limits. The thoracic aorta and its branches demonstrate calcifications although no aneurysmal dilatation or dissection is seen. Pulmonary artery demonstrates a normal branching pattern.  Considerable respiratory motion limits evaluation of the distal branches. No definitive pulmonary embolism is seen. The visualized upper abdomen is within normal limits. The bony structures show degenerative change of thoracic spine. No definitive acute bony abnormality is seen. Review of the MIP images confirms the above findings. IMPRESSION: Severely limited exam by patient motion artifact. No definitive pulmonary embolism is seen. Bibasilar atelectatic changes. Electronically Signed   By: Inez Catalina M.D.   On: 05/30/2015 16:45   Ct Thoracic Spine Wo Contrast  06/01/2015  CLINICAL DATA:  82 year old female with cough and thoracic  spine/upper back pain. EXAM: CT THORACIC SPINE WITHOUT CONTRAST TECHNIQUE: Multidetector CT imaging of the thoracic spine was performed without intravenous contrast administration. Multiplanar CT image reconstructions were also generated. COMPARISON:  Concurrently obtained CT scan of the chest FINDINGS: There is no evidence of acute fracture or malalignment. The vertebral body heights are maintained. Mild multilevel degenerative disc disease without focality. No significant facet arthropathy. Gentle levoconvex scoliosis of the thoracic spine with apex at T11-T12. Normal bony mineralization.  No lytic or blastic osseous lesion. IMPRESSION: 1. No acute fracture or malalignment. 2. Levoconvex scoliosis of the thoracic spine. 3. Mild multilevel degenerative disc disease without focality. Electronically Signed   By: Jacqulynn Cadet M.D.   On: 06/01/2015 12:52    Assessment/Plan 1. Chronic lower back pain Continue current medication  2. Debility Continue physical therapy  3. Sepsis due to pneumonia ([place]) Resolved  4. Thrombocytosis ([facility]) Stable  5. Essential hypertension Controlled  6. Adjustment disorder with mixed anxiety and depressed mood Improved

## 2015-06-20 NOTE — Progress Notes (Signed)
Patient ID: Brandy Crawford, female   DOB: 1923-11-02, 82 y.o.   MRN: [id number]    HISTORY AND PHYSICAL  Location:  [place] Room Number: N 34 Place of Service: SNF [phone])   Extended Emergency Contact Information Primary Emergency Contact: [name] Address: [place], [place] [address] [name] of [place] Phone: [phone] Work Phone: [phone] Mobile Phone: [phone] Relation: Daughter Secondary Emergency Contact: [name] Address: [address] [place] [address] [name] of [place] Phone: [phone] Mobile Phone: [phone] Relation: Son  Advanced Directive information Does patient have an advance directive?: Yes, Type of Advance Directive: Healthcare Power of [place];Living will;Out of facility DNR (pink MOST or yellow form), Pre-existing out of facility DNR order (yellow form or pink MOST form): Yellow form placed in chart (order not valid for inpatient use), Does patient want to make changes to advanced directive?: No - Patient declined  Chief Complaint  Patient presents with  . New Admit To SNF    HPI:  Admitted to skilled nursing facility 06/03/2015. Hospitalized 05/30/2015 06/13/2015 for pneumonia.  Other problems included thrombocytosis, hypertension, hypoxia, hyponatremia to 133.   Chronic diseases previously documented include asthma, GERD, cervical disc disease, depression, hyperlipidemia, osteoporosis, cerebrovascular disease and cerebral atrophy, aortic atherosclerosis, lumbar degenerative disc disease, diverticulosis, chronic low back pain, osteoarthritis of the hips.  Patient complains of difficulty with weightbearing due to pain in the back.  Patient was admitted to SNF for strengthening, pain control, instruction in safe mobility, and improvement in self-care skills   Past Medical History  Diagnosis Date  . Asthma   . GERD (gastroesophageal  reflux disease)   . Hypertension   . TIA (transient ischemic attack) 09/20/2006  . Cervical disc disease   . Anemia   . Depression   . Lumbosacral spondylosis 10/28/2009  . Abnormality of gait 04/28/2010    History of fall onto right shoulder-has participated in PT. Uses walker.     . Acute lower GI bleeding 10/30/2014  . Fall at nursing home 10/25/2014  . History of cardiovascular disorder 09/20/2006    X 3 in 2000 now on aspirin.    Marland Kitchen Hyperlipidemia 09/20/2006    05/15/14 LDL 95   . OSTEOPOROSIS 09/20/2006    Noted 2008    . Overactive bladder 11/29/2013    Myrbetriq prescribed by urology.    . SCOLIOSIS 10/28/2009  . Diarrhea 10/25/14  . Acute encephalopathy 10/25/14    Occurred when septic  . Diverticulosis 10/27/2014  . Cerebrovascular disease 10/27/2014    Small vessel disease  . Cerebral atrophy 10/27/2014  . Aortic atherosclerosis ([place]) 10/27/2014  . Degenerative disc disease, lumbar 10/27/2014  . Scoliosis 10/27/2014  . Protein calorie malnutrition ([place])     06/06/15 TP 6.0 albumin 2.9    Past Surgical History  Procedure Laterality Date  . Appendectomy    . Abdominal hysterectomy    . Tonsillectomy    . Cholecystectomy      Patient Care Team: Estill Dooms, MD as PCP - General (Internal Medicine) Man [name] X, NP as Nurse Practitioner (Nurse Practitioner)  Social History   Social History  . Marital Status: Widowed    Spouse Name: N/A  . Number of Children: N/A  . Years of Education: N/A   Occupational History  . retired Pharmacist, hospital    Social History Main  Topics  . Smoking status: Never Smoker   . Smokeless tobacco: Never Used  . Alcohol Use: No  . Drug Use: No  . Sexual Activity: No   Other Topics Concern  . Not on file   Social History Narrative   Widowed from 2nd husband in 2000.    Lives alone at [facility], moved to [place] 01/15/14,  Does ADLs and IADLs.    Hobbies: crossword puzzles, used to knit   Never smoked   Alcohol none   Caffeine none     Exercise walking   Walks with walker       reports that she has never smoked. She has never used smokeless tobacco. She reports that she does not drink alcohol or use illicit drugs.  Family History  Problem Relation Age of Onset  . Pancreatic cancer Father 32    deceased  . Heart failure Mother 104    deceased  . Leukemia Brother 4    deceased  . Diabetes type II    . Alcohol abuse     Family Status  Relation Status Death Age  . Father Deceased   . Mother Deceased   . Brother Deceased 66's  . Daughter Alive   . Son Alive     Immunization History  Administered Date(s) Administered  . HiB (PRP-OMP) 02/12/2004  . Influenza Split 01/11/2012  . Influenza Whole 01/07/2009  . Influenza-Unspecified 01/11/2014, 01/01/2015  . PPD Test 11/14/2014  . Pneumococcal Polysaccharide-23 04/28/2010  . Tdap 08/24/2011  . Zoster 04/13/2010    Allergies  Allergen Reactions  . Tramadol Other (See Comments)    Hallucinations, agitation/combativeness  . Amoxicillin     Per MAR  . Oxycodone Other (See Comments)    Per MAR   . Sulfamethoxazole     Per MAR   . Penicillins Rash    Medications: Patient's Medications  New Prescriptions   No medications on file  Previous Medications   ACETAMINOPHEN (TYLENOL) 325 MG TABLET    Take 650 mg by mouth every 4 (four) hours as needed for moderate pain or headache.   ALBUTEROL (PROVENTIL HFA;VENTOLIN HFA) 108 (90 BASE) MCG/ACT INHALER    Inhale 2 puffs into the lungs every 6 (six) hours as needed for wheezing or shortness of breath.   ALUM & MAG HYDROXIDE-SIMETH (MAALOX/MYLANTA) 200-200-20 MG/5ML SUSPENSION    Take 30 mLs by mouth as needed for indigestion or heartburn.   AMLODIPINE (NORVASC) 5 MG TABLET    Take 5 mg by mouth daily with breakfast.    CHOLESTYRAMINE (QUESTRAN) 4 G PACKET    Take 4 g by mouth 2 (two) times daily.   FERROUS SULFATE 325 (65 FE) MG TABLET    Take 325 mg by mouth daily.   LOPERAMIDE (IMODIUM) 2 MG CAPSULE    Take  one to two tablets by mouth as needed for diarrhea   LOSARTAN (COZAAR) 100 MG TABLET    Take 100 mg by mouth daily with breakfast.    MIRABEGRON ER (MYRBETRIQ) 25 MG TB24    Take 25 mg by mouth daily with breakfast. Prescribed by urology   NITROGLYCERIN (NITROSTAT) 0.4 MG SL TABLET    Place 0.4 mg under the tongue every 5 (five) minutes as needed for chest pain.   OMEPRAZOLE (PRILOSEC) 20 MG CAPSULE    Take 20 mg by mouth daily.   PYRIDOXINE (VITAMIN B-6) 100 MG TABLET    Take 100 mg by mouth daily with breakfast.  TRAMADOL (ULTRAM) 50 MG TABLET    Take 1 tablet (50 mg total) by mouth every 6 (six) hours as needed for moderate pain or severe pain.  Modified Medications   No medications on file  Discontinued Medications   No medications on file    Review of Systems  Constitutional: Negative for fever, chills and diaphoresis.  HENT: Positive for hearing loss. Negative for congestion, ear discharge, ear pain, nosebleeds, sore throat and tinnitus.   Eyes: Negative for photophobia, pain, discharge and redness.  Respiratory: Positive for cough. Negative for shortness of breath, wheezing and stridor.        Congestive cough, yellow phlegm x 3 days  Cardiovascular: Positive for leg swelling. Negative for chest pain and palpitations.       Trace  Gastrointestinal: Positive for diarrhea (Under control with Questran and Imodium). Negative for nausea, vomiting, abdominal pain, constipation and blood in stool.  Endocrine: Negative for polydipsia.  Genitourinary: Positive for frequency. Negative for dysuria, urgency, hematuria and flank pain.       1-2x/night. Stress incontinence  Musculoskeletal: Positive for back pain. Negative for myalgias and neck pain.  Skin: Negative.  Negative for rash.  Allergic/Immunologic: Negative for environmental allergies.  Neurological: Negative for dizziness, tremors, seizures, weakness and headaches.       Memory loss. Patient had delirium when septic.    Hematological: Does not bruise/bleed easily.       Anemic secondary to acute blood loss from lower GI bleeding  Psychiatric/Behavioral: Negative for suicidal ideas and hallucinations. The patient is not nervous/anxious.     Filed Vitals:   06/20/15 1609  BP: 143/69  Pulse: 94  Temp: 101.1 F (38.4 C)  Resp: 18  Height: '5\' 6"'$  (1.676 m)  Weight: 141 lb 8 oz (64.184 kg)  SpO2: 98%   Body mass index is 22.85 kg/(m^2). Filed Weights   06/20/15 1609  Weight: 141 lb 8 oz (64.184 kg)     Physical Exam  Constitutional: She is oriented to person, place, and time. She appears well-developed and well-nourished. No distress.  HENT:  Head: Normocephalic and atraumatic.  Right Ear: No lacerations. No drainage. No foreign bodies. No mastoid tenderness. Tympanic membrane is not injected, not scarred, not perforated, not erythematous, not retracted and not bulging. Tympanic membrane mobility is abnormal. No middle ear effusion. Decreased hearing is noted.  Left Ear: No lacerations. No drainage. No foreign bodies. No mastoid tenderness. Tympanic membrane is not injected, not scarred, not perforated, not erythematous, not retracted and not bulging. Tympanic membrane mobility is abnormal.  No middle ear effusion. Decreased hearing is noted.  Nose: Nose normal.  Mouth/Throat: Oropharynx is clear and moist. No oropharyngeal exudate.  Eyes: Conjunctivae and EOM are normal. Pupils are equal, round, and reactive to light. Right eye exhibits no discharge. Left eye exhibits no discharge. No scleral icterus.  Neck: Normal range of motion. Neck supple. No JVD present. No tracheal deviation present. No thyromegaly present.  Cardiovascular: Normal rate, regular rhythm, normal heart sounds and intact distal pulses.  Exam reveals no gallop and no friction rub.   No murmur heard. Pulmonary/Chest: Effort normal. No stridor. No respiratory distress. She has no wheezes. She has no rales. She exhibits no tenderness.   Coarse rales posterior mid to lower lungs.  Abdominal: Soft. Bowel sounds are normal. She exhibits no distension and no mass. There is no tenderness. There is no rebound and no guarding.  Genitourinary: Guaiac negative stool.  Musculoskeletal: She exhibits no edema or  tenderness.  Left lower back, left hip, left thigh pain with weight bearing and position changes-resolved Chest pain was relieved after Mylanta 05/28/15 Neck pain 05/29/15 Right upper back pin 05/30/15 06/04/15 mid to lower back pain, more on the right side  Lymphadenopathy:    She has no cervical adenopathy.  Neurological: She is alert and oriented to person, place, and time. She has normal reflexes. No cranial nerve deficit. She exhibits normal muscle tone. Coordination normal.  Skin: Skin is warm and dry. No rash noted. She is not diaphoretic. No erythema. No pallor.  Lots of moles.   Psychiatric: Her behavior is normal. Thought content normal.    Labs reviewed: Lab Summary Latest Ref Rng 06/10/2015 06/06/2015 06/03/2015 06/02/2015 06/01/2015  Hemoglobin 12.0 - 16.0 g/dL 78.2 97.6 50.2 28.6 14.3  Hematocrit 36 - 46 % 39 37 36.4 36.6 38.7  White count - 7.3 8.4 9.2 10.7(H) 10.3  Platelet count 150 - 399 K/L 602(A) 565(A) 535(H) 486(H) 456(H)  Sodium 137 - 147 mmol/L 139 (None) 135 135 139  Potassium 3.4 - 5.3 mmol/L 4.5 (None) 3.9 3.8 3.5  Calcium 8.9 - 10.3 mg/dL (None) (None) 9.6 9.5 9.8  Phosphorus - (None) (None) (None) (None) (None)  Creatinine 0.5 - 1.1 mg/dL 0.6 0.8 0.15 6.34 9.38  AST 13 - 35 U/L (None) 16 (None) (None) (None)  Alk Phos 25 - 125 U/L (None) 71 (None) (None) (None)  Bilirubin - (None) (None) (None) (None) (None)  Glucose - 91 102 117(H) 118(H) 109(H)  Cholesterol - (None) (None) (None) (None) (None)  HDL cholesterol - (None) (None) (None) (None) (None)  Triglycerides - (None) (None) (None) (None) (None)  LDL Direct - (None) (None) (None) (None) (None)  LDL Calc - (None) (None) (None) (None) (None)   Total protein - (None) (None) (None) (None) (None)  Albumin - (None) (None) (None) (None) (None)   Lab Results  Component Value Date   BUN 10 06/10/2015   Lab Results  Component Value Date   HGBA1C 5.9 08/24/2011   Lab Results  Component Value Date   TSH 2.170 10/29/2014          Dg Chest 2 View  05/30/2015  CLINICAL DATA:  Productive cough and chest pain EXAM: CHEST  2 VIEW COMPARISON:  10/25/2014 FINDINGS: Cardiac shadow is at the upper limits of normal in size but stable. Aortic calcifications are again seen. The lungs are well aerated bilaterally. No focal infiltrate or sizable effusion is seen. Degenerative changes of thoracic spine are noted. IMPRESSION: No acute abnormality noted. Electronically Signed   By: Alcide Clever M.D.   On: 05/30/2015 13:00   Ct Angio Chest Pe W/cm &/or Wo Cm  05/30/2015  CLINICAL DATA:  Recent history of pneumonia with right-sided chest pain, initial encounter EXAM: CT ANGIOGRAPHY CHEST WITH CONTRAST TECHNIQUE: Multidetector CT imaging of the chest was performed using the standard protocol during bolus administration of intravenous contrast. Multiplanar CT image reconstructions and MIPs were obtained to evaluate the vascular anatomy. CONTRAST:  OMNIPAQUE IOHEXOL 350 MG/ML SOLN COMPARISON:  01/13/2014 FINDINGS: Lungs are well aerated bilaterally but demonstrate minimal atelectatic changes bilaterally in the lower lobes. No focal infiltrate or sizable effusion is seen. No definitive parenchymal nodule is noted. Inflammatory changes seen on prior examination are not well appreciated on this exam. The thoracic inlet is within normal limits. The thoracic aorta and its branches demonstrate calcifications although no aneurysmal dilatation or dissection is seen. Pulmonary artery demonstrates a normal branching pattern. Considerable  respiratory motion limits evaluation of the distal branches. No definitive pulmonary embolism is seen. The visualized upper  abdomen is within normal limits. The bony structures show degenerative change of thoracic spine. No definitive acute bony abnormality is seen. Review of the MIP images confirms the above findings. IMPRESSION: Severely limited exam by patient motion artifact. No definitive pulmonary embolism is seen. Bibasilar atelectatic changes. Electronically Signed   By: Inez Catalina M.D.   On: 05/30/2015 16:45   Ct Thoracic Spine Wo Contrast  06/01/2015  CLINICAL DATA:  82 year old female with cough and thoracic spine/upper back pain. EXAM: CT THORACIC SPINE WITHOUT CONTRAST TECHNIQUE: Multidetector CT imaging of the thoracic spine was performed without intravenous contrast administration. Multiplanar CT image reconstructions were also generated. COMPARISON:  Concurrently obtained CT scan of the chest FINDINGS: There is no evidence of acute fracture or malalignment. The vertebral body heights are maintained. Mild multilevel degenerative disc disease without focality. No significant facet arthropathy. Gentle levoconvex scoliosis of the thoracic spine with apex at T11-T12. Normal bony mineralization.  No lytic or blastic osseous lesion. IMPRESSION: 1. No acute fracture or malalignment. 2. Levoconvex scoliosis of the thoracic spine. 3. Mild multilevel degenerative disc disease without focality. Electronically Signed   By: Jacqulynn Cadet M.D.   On: 06/01/2015 12:52     Assessment/Plan  1. Sepsis due to pneumonia ([place]) Resolving. Patient remains weak.  2. Thrombocytosis ([place]) Follow-up with lab.  3. IDA (iron deficiency anemia) Monitor with lab  4. Chronic lower back pain Add Aleve 220 mg twice daily  5. Acute hyponatremia Monitor lab  6. Essential hypertension Controlled  7. Debility Engaged in physical therapy and occupational therapy.

## 2015-06-25 ENCOUNTER — Encounter: Payer: Self-pay | Admitting: Nurse Practitioner

## 2015-07-08 DIAGNOSIS — R2681 Unsteadiness on feet: Secondary | ICD-10-CM | POA: Diagnosis not present

## 2015-07-08 DIAGNOSIS — R2689 Other abnormalities of gait and mobility: Secondary | ICD-10-CM | POA: Diagnosis not present

## 2015-07-08 DIAGNOSIS — S82001D Unspecified fracture of right patella, subsequent encounter for closed fracture with routine healing: Secondary | ICD-10-CM | POA: Diagnosis not present

## 2015-07-08 DIAGNOSIS — Z9181 History of falling: Secondary | ICD-10-CM | POA: Diagnosis not present

## 2015-07-08 DIAGNOSIS — K2971 Gastritis, unspecified, with bleeding: Secondary | ICD-10-CM | POA: Diagnosis not present

## 2015-07-08 DIAGNOSIS — R29898 Other symptoms and signs involving the musculoskeletal system: Secondary | ICD-10-CM | POA: Diagnosis not present

## 2015-07-08 DIAGNOSIS — M25552 Pain in left hip: Secondary | ICD-10-CM | POA: Diagnosis not present

## 2015-07-08 DIAGNOSIS — W19XXXA Unspecified fall, initial encounter: Secondary | ICD-10-CM | POA: Diagnosis not present

## 2015-07-08 DIAGNOSIS — M6281 Muscle weakness (generalized): Secondary | ICD-10-CM | POA: Diagnosis not present

## 2015-07-08 DIAGNOSIS — M4806 Spinal stenosis, lumbar region: Secondary | ICD-10-CM | POA: Diagnosis not present

## 2015-07-08 DIAGNOSIS — H81399 Other peripheral vertigo, unspecified ear: Secondary | ICD-10-CM | POA: Diagnosis not present

## 2015-07-08 DIAGNOSIS — R269 Unspecified abnormalities of gait and mobility: Secondary | ICD-10-CM | POA: Diagnosis not present

## 2015-07-11 DIAGNOSIS — R29898 Other symptoms and signs involving the musculoskeletal system: Secondary | ICD-10-CM | POA: Diagnosis not present

## 2015-07-11 DIAGNOSIS — K2971 Gastritis, unspecified, with bleeding: Secondary | ICD-10-CM | POA: Diagnosis not present

## 2015-07-11 DIAGNOSIS — M25552 Pain in left hip: Secondary | ICD-10-CM | POA: Diagnosis not present

## 2015-07-11 DIAGNOSIS — M6281 Muscle weakness (generalized): Secondary | ICD-10-CM | POA: Diagnosis not present

## 2015-07-11 DIAGNOSIS — R2681 Unsteadiness on feet: Secondary | ICD-10-CM | POA: Diagnosis not present

## 2015-07-11 DIAGNOSIS — R2689 Other abnormalities of gait and mobility: Secondary | ICD-10-CM | POA: Diagnosis not present

## 2015-07-15 DIAGNOSIS — W19XXXA Unspecified fall, initial encounter: Secondary | ICD-10-CM | POA: Diagnosis not present

## 2015-07-15 DIAGNOSIS — M25552 Pain in left hip: Secondary | ICD-10-CM | POA: Diagnosis not present

## 2015-07-15 DIAGNOSIS — H81399 Other peripheral vertigo, unspecified ear: Secondary | ICD-10-CM | POA: Diagnosis not present

## 2015-07-15 DIAGNOSIS — R2681 Unsteadiness on feet: Secondary | ICD-10-CM | POA: Diagnosis not present

## 2015-07-15 DIAGNOSIS — R2689 Other abnormalities of gait and mobility: Secondary | ICD-10-CM | POA: Diagnosis not present

## 2015-07-15 DIAGNOSIS — M4806 Spinal stenosis, lumbar region: Secondary | ICD-10-CM | POA: Diagnosis not present

## 2015-07-15 DIAGNOSIS — S82001D Unspecified fracture of right patella, subsequent encounter for closed fracture with routine healing: Secondary | ICD-10-CM | POA: Diagnosis not present

## 2015-07-15 DIAGNOSIS — K2971 Gastritis, unspecified, with bleeding: Secondary | ICD-10-CM | POA: Diagnosis not present

## 2015-07-15 DIAGNOSIS — Z9181 History of falling: Secondary | ICD-10-CM | POA: Diagnosis not present

## 2015-07-15 DIAGNOSIS — R269 Unspecified abnormalities of gait and mobility: Secondary | ICD-10-CM | POA: Diagnosis not present

## 2015-07-15 DIAGNOSIS — M6281 Muscle weakness (generalized): Secondary | ICD-10-CM | POA: Diagnosis not present

## 2015-07-15 DIAGNOSIS — R29898 Other symptoms and signs involving the musculoskeletal system: Secondary | ICD-10-CM | POA: Diagnosis not present

## 2015-07-17 DIAGNOSIS — M6281 Muscle weakness (generalized): Secondary | ICD-10-CM | POA: Diagnosis not present

## 2015-07-17 DIAGNOSIS — M25552 Pain in left hip: Secondary | ICD-10-CM | POA: Diagnosis not present

## 2015-07-17 DIAGNOSIS — R29898 Other symptoms and signs involving the musculoskeletal system: Secondary | ICD-10-CM | POA: Diagnosis not present

## 2015-07-17 DIAGNOSIS — R2681 Unsteadiness on feet: Secondary | ICD-10-CM | POA: Diagnosis not present

## 2015-07-17 DIAGNOSIS — R2689 Other abnormalities of gait and mobility: Secondary | ICD-10-CM | POA: Diagnosis not present

## 2015-07-17 DIAGNOSIS — K2971 Gastritis, unspecified, with bleeding: Secondary | ICD-10-CM | POA: Diagnosis not present

## 2015-07-18 ENCOUNTER — Non-Acute Institutional Stay: Payer: Medicare Other | Admitting: Nurse Practitioner

## 2015-07-18 ENCOUNTER — Encounter: Payer: Self-pay | Admitting: Nurse Practitioner

## 2015-07-18 DIAGNOSIS — N318 Other neuromuscular dysfunction of bladder: Secondary | ICD-10-CM

## 2015-07-18 DIAGNOSIS — D649 Anemia, unspecified: Secondary | ICD-10-CM | POA: Diagnosis not present

## 2015-07-18 DIAGNOSIS — R2689 Other abnormalities of gait and mobility: Secondary | ICD-10-CM | POA: Diagnosis not present

## 2015-07-18 DIAGNOSIS — M25552 Pain in left hip: Secondary | ICD-10-CM

## 2015-07-18 DIAGNOSIS — I10 Essential (primary) hypertension: Secondary | ICD-10-CM

## 2015-07-18 DIAGNOSIS — M6281 Muscle weakness (generalized): Secondary | ICD-10-CM | POA: Diagnosis not present

## 2015-07-18 DIAGNOSIS — R29898 Other symptoms and signs involving the musculoskeletal system: Secondary | ICD-10-CM | POA: Diagnosis not present

## 2015-07-18 DIAGNOSIS — M25551 Pain in right hip: Secondary | ICD-10-CM | POA: Insufficient documentation

## 2015-07-18 DIAGNOSIS — K219 Gastro-esophageal reflux disease without esophagitis: Secondary | ICD-10-CM | POA: Diagnosis not present

## 2015-07-18 DIAGNOSIS — F4323 Adjustment disorder with mixed anxiety and depressed mood: Secondary | ICD-10-CM

## 2015-07-18 DIAGNOSIS — K2971 Gastritis, unspecified, with bleeding: Secondary | ICD-10-CM | POA: Diagnosis not present

## 2015-07-18 DIAGNOSIS — R2681 Unsteadiness on feet: Secondary | ICD-10-CM | POA: Diagnosis not present

## 2015-07-18 NOTE — Assessment & Plan Note (Signed)
Controlled, continue Amlodipine 5mg, Losartan 100mg 

## 2015-07-18 NOTE — Progress Notes (Signed)
Patient ID: Brandy Crawford, female   DOB: 04-14-1923, 82 y.o.   MRN: [id number]  Location:  [facility] Room Number: [id number] Place of Service: [facility] Provider:  Lennie Odor Kegan Shepardson NP  GREEN, Viviann Spare, MD  Patient Care Team: Estill Dooms, MD as PCP - General (Internal Medicine) Kriston Mckinnie X, NP as Nurse Practitioner (Nurse Practitioner)  Extended Emergency Contact Information Primary Emergency Contact: [name] Address: [place], [place] [address] [name] of [place] Phone: [phone] Work Phone: [phone] Mobile Phone: [phone] Relation: Daughter Secondary Emergency Contact: [name] Address: [address] [place] [address] [name] of [place] Phone: [phone] Mobile Phone: [phone] Relation: Son  Code Status:  DNR Goals of care: Advanced Directive information Advanced Directives 07/18/2015  Does patient have an advance directive? Yes  Type of Paramedic of [place];Living will;Out of facility DNR (pink MOST or yellow form)  Does patient want to make changes to advanced directive? No - Patient declined  Copy of advanced directive(s) in chart? Yes     Chief Complaint  Patient presents with  . Medical Management of Chronic Issues    Routine Visit    HPI:  Pt is a 82 y.o. female seen today for medical management of chronic diseases.  Left hip pain, ambulates with walker, Ortho consult next Monday. Taking Naproxen 220 bid, Tylenol 650mg  qhr prn. Blood pressure is controlled Amlodipine 5mg , Losartan 100mg . Urinary frequent, managed with Myrbetriq 25mg , mood is stable Cymbalta 20mg .    05/30/15 to 06/03/15 hospitalized for Sepsis due to pneumonia  Past Medical History  Diagnosis Date  . Asthma   . GERD (gastroesophageal reflux disease)   . Hypertension   . TIA (transient ischemic attack) 09/20/2006  . Cervical disc disease   . Anemia   .  Depression   . Lumbosacral spondylosis 10/28/2009  . Abnormality of gait 04/28/2010    History of fall onto right shoulder-has participated in PT. Uses walker.     . Acute lower GI bleeding 10/30/2014  . Fall at nursing home 10/25/2014  . History of cardiovascular disorder 09/20/2006    X 3 in 2000 now on aspirin.    Marland Kitchen Hyperlipidemia 09/20/2006    05/15/14 LDL 95   . OSTEOPOROSIS 09/20/2006    Noted 2008    . Overactive bladder 11/29/2013    Myrbetriq prescribed by urology.    . SCOLIOSIS 10/28/2009  . Diarrhea 10/25/14  . Acute encephalopathy 10/25/14    Occurred when septic  . Diverticulosis 10/27/2014  . Cerebrovascular disease 10/27/2014    Small vessel disease  . Cerebral atrophy 10/27/2014  . Aortic atherosclerosis ([place]) 10/27/2014  . Degenerative disc disease, lumbar 10/27/2014  . Scoliosis 10/27/2014  . Protein calorie malnutrition ([place])     06/06/15 TP 6.0 albumin 2.9   Past Surgical History  Procedure Laterality Date  . Appendectomy    . Abdominal hysterectomy    . Tonsillectomy    . Cholecystectomy      Allergies  Allergen Reactions  . Tramadol Other (See Comments)    Hallucinations, agitation/combativeness  . Amoxicillin     Per MAR  . Oxycodone Other (See Comments)    Per MAR   . Sulfamethoxazole     Per MAR   . Penicillins Rash      Medication List  This list is accurate as of: 07/18/15  5:21 PM.  Always use your most recent med list.               acetaminophen 325 MG tablet  Commonly known as:  TYLENOL  Take 650 mg by mouth every 4 (four) hours as needed for moderate pain or headache.     albuterol 108 (90 Base) MCG/ACT inhaler  Commonly known as:  PROVENTIL HFA;VENTOLIN HFA  Inhale 2 puffs into the lungs every 6 (six) hours as needed for wheezing or shortness of breath.     alum & mag hydroxide-simeth 200-200-20 MG/5ML suspension  Commonly known as:  MAALOX/MYLANTA  Take 30 mLs by mouth as needed for indigestion or heartburn.     amLODipine 5  MG tablet  Commonly known as:  NORVASC  Take 5 mg by mouth daily with breakfast.     cholestyramine 4 g packet  Commonly known as:  QUESTRAN  Take 4 g by mouth 2 (two) times daily.     DULoxetine 20 MG capsule  Commonly known as:  CYMBALTA  Take 20 mg by mouth daily.     ferrous sulfate 325 (65 FE) MG tablet  Take 325 mg by mouth daily.     loperamide 2 MG capsule  Commonly known as:  IMODIUM  Take one to two tablets by mouth as needed for diarrhea, Max 16 mg/24 hours call MD if Diarrhea continues     losartan 100 MG tablet  Commonly known as:  COZAAR  Take 100 mg by mouth daily with breakfast.     MYRBETRIQ 25 MG Tb24 tablet  Generic drug:  mirabegron ER  Take 25 mg by mouth daily with breakfast. Prescribed by urology     naproxen sodium 220 MG tablet  Commonly known as:  ANAPROX  Take 220 mg by mouth 2 (two) times daily with a meal.     nitroGLYCERIN 0.4 MG SL tablet  Commonly known as:  NITROSTAT  Place 0.4 mg under the tongue every 5 (five) minutes as needed for chest pain.     omeprazole 20 MG capsule  Commonly known as:  PRILOSEC  Take 20 mg by mouth daily.     pyridOXINE 100 MG tablet  Commonly known as:  VITAMIN B-6  Take 100 mg by mouth daily with breakfast.        Review of Systems  Constitutional: Negative for fever, chills and diaphoresis.  HENT: Positive for hearing loss. Negative for congestion, ear discharge, ear pain, nosebleeds, sore throat and tinnitus.   Eyes: Negative for photophobia, pain, discharge and redness.  Respiratory: Positive for cough. Negative for shortness of breath, wheezing and stridor.        Resolved congestive cough, yellow phlegm   Cardiovascular: Positive for leg swelling. Negative for chest pain and palpitations.       Trace edema BLE  Gastrointestinal: Negative for nausea, vomiting, abdominal pain, diarrhea (Under control with Questran and Imodium), constipation and blood in stool.  Endocrine: Negative for polydipsia.    Genitourinary: Positive for frequency. Negative for dysuria, urgency, hematuria and flank pain.       1-2x/night. Stress incontinence  Musculoskeletal: Positive for back pain, arthralgias and gait problem. Negative for myalgias and neck pain.       Left lower back, left hip, left thigh pain with weight bearing and position changes Chest pain was relieved after Mylanta 05/28/15 Neck pain 05/29/15 Right upper back pin 05/30/15  Skin: Negative.  Negative for  rash.  Allergic/Immunologic: Negative for environmental allergies.  Neurological: Negative for dizziness, tremors, seizures, weakness and headaches.       Memory loss. Patient had delirium when septic.  Hematological: Does not bruise/bleed easily.       Anemic secondary to acute blood loss from lower GI bleeding  Psychiatric/Behavioral: Negative for suicidal ideas, hallucinations, confusion and agitation. The patient is nervous/anxious.     Immunization History  Administered Date(s) Administered  . HiB (PRP-OMP) 02/12/2004  . Influenza Split 01/11/2012  . Influenza Whole 01/07/2009  . Influenza-Unspecified 01/11/2014, 01/01/2015  . PPD Test 11/14/2014, 06/03/2015, 06/17/2015  . Pneumococcal Polysaccharide-23 04/28/2010  . Tdap 08/24/2011  . Zoster 04/13/2010   Pertinent  Health Maintenance Due  Topic Date Due  . DEXA SCAN  01/28/1991  . PNA vac Low Risk Adult (2 of 2 - PCV13) 04/29/2011  . INFLUENZA VACCINE  11/12/2015   Fall Risk  06/20/2015 05/23/2015 11/09/2014 05/10/2014  Falls in the past year? No No No No   Functional Status Survey:    Filed Vitals:   07/18/15 1015  BP: 112/60  Pulse: 86  Temp: 98.5 F (36.9 C)  TempSrc: Oral  Resp: 20  Height: 5\' 6"  (1.676 m)   There is no weight on file to calculate BMI. Physical Exam  Constitutional: She is oriented to person, place, and time. She appears well-developed and well-nourished. No distress.  HENT:  Head: Normocephalic and atraumatic.  Right Ear: No lacerations. No  drainage. No foreign bodies. No mastoid tenderness. Tympanic membrane is not injected, not scarred, not perforated, not erythematous, not retracted and not bulging. Tympanic membrane mobility is abnormal. No middle ear effusion. Decreased hearing is noted.  Left Ear: No lacerations. No drainage. No foreign bodies. No mastoid tenderness. Tympanic membrane is not injected, not scarred, not perforated, not erythematous, not retracted and not bulging. Tympanic membrane mobility is abnormal.  No middle ear effusion. Decreased hearing is noted.  Nose: Nose normal.  Mouth/Throat: Oropharynx is clear and moist. No oropharyngeal exudate.  Eyes: Conjunctivae and EOM are normal. Pupils are equal, round, and reactive to light. Right eye exhibits no discharge. Left eye exhibits no discharge. No scleral icterus.  Neck: Normal range of motion. Neck supple. No JVD present. No tracheal deviation present. No thyromegaly present.  Cardiovascular: Normal rate, regular rhythm, normal heart sounds and intact distal pulses.  Exam reveals no gallop and no friction rub.   No murmur heard. Pulmonary/Chest: Effort normal. No stridor. No respiratory distress. She has no wheezes. She has no rales. She exhibits no tenderness.  Coarse rales posterior mid to lower lungs.  Abdominal: Soft. Bowel sounds are normal. She exhibits no distension and no mass. There is no tenderness. There is no rebound and no guarding.  Genitourinary: Guaiac negative stool.  Musculoskeletal: She exhibits edema and tenderness.  Left lower back, left hip, left thigh pain with weight bearing and position changes Chest pain was relieved after Mylanta 05/28/15 Neck pain 05/29/15 Right upper back pin 05/30/15 06/04/15 mid to lower back pain, more on the right side BLE trace edema  Lymphadenopathy:    She has no cervical adenopathy.  Neurological: She is alert and oriented to person, place, and time. She has normal reflexes. No cranial nerve deficit. She  exhibits normal muscle tone. Coordination normal.  Skin: Skin is warm and dry. No rash noted. She is not diaphoretic. No erythema. No pallor.  Lots of moles.   Psychiatric: Her behavior is normal. Thought content normal.  Labs reviewed:  Recent Labs  06/01/15 0552 06/02/15 0520 06/03/15 0444 06/06/15 06/10/15  NA 139 135 135  --  139  K 3.5 3.8 3.9  --  4.5  CL 104 100* 102  --   --   CO2 26 27 25   --   --   GLUCOSE 109* 118* 117*  --   --   BUN 9 16 14 19 10   CREATININE 0.50 0.67 0.60 0.8 0.6  CALCIUM 9.8 9.5 9.6  --   --     Recent Labs  10/25/14 0714 05/14/15 05/30/15 1248 06/06/15  AST 23 16 24 16   ALT 22 15 28 19   ALKPHOS 55 59 69 71  BILITOT 0.9  --  1.1  --   PROT 6.4*  --  7.4  --   ALBUMIN 3.7  --  3.6  --     Recent Labs  10/25/14 0714  05/30/15 1237  06/01/15 0552 06/02/15 0520 06/03/15 0444 06/06/15 06/10/15  WBC 20.2*  < > 15.3*  < > 10.3 10.7* 9.2 8.4 7.3  NEUTROABS 19.1*  --  12.4*  --   --   --   --   --   --   HGB 11.0*  < > 12.9  < > 12.9 12.3 12.1 12.4 13.1  HCT 32.7*  < > 38.3  < > 38.7 36.6 36.4 37 39  MCV 92.1  < > 93.6  < > 94.2 92.4 94.3  --   --   PLT 333  < > 415*  < > 456* 486* 535* 565* 602*  < > = values in this interval not displayed. Lab Results  Component Value Date   TSH 2.170 10/29/2014   Lab Results  Component Value Date   HGBA1C 5.9 08/24/2011   Lab Results  Component Value Date   CHOL 176 05/15/2014   HDL 62 05/15/2014   LDLCALC 95 05/15/2014   LDLDIRECT 114.3 12/08/2007   TRIG 93 05/15/2014   CHOLHDL 2.9 CALC 12/08/2007    Significant Diagnostic Results in last 30 days:  No results found.  Assessment/Plan  Left hip pain Left hip pain, ambulates with walker, Ortho consult next Monday. Taking Naproxen 220 bid, Tylenol 650mg  qhr prn. Add Flexeril 5mg  daily.    Essential hypertension Controlled, continue Amlodipine 5mg , Losartan 100mg   GERD (gastroesophageal reflux disease) Complained chest pain  05/29/15, relieved after Mylanta 30ccx1, continue Omeprazole 20mg   Hypertonicity of bladder Saw Urology 06/06/14-continued with Myrbetriq 25mg .  Stress incontinent in natures. 3-4x/night  Adjustment disorder with mixed anxiety and depressed mood In setting of migratory aches and pains, will try Cymbalta 20mg  daily, observe the patient.    Anemia 06/10/15 Hgb 13.1 Update CBC, may consider dc Fe     Family/ staff Communication: continue [facility]  Labs/tests ordered:  X-ray left hip 2 views. CBC

## 2015-07-18 NOTE — Assessment & Plan Note (Signed)
Left hip pain, ambulates with walker, Ortho consult next Monday. Taking Naproxen 220 bid, Tylenol 650mg  qhr prn. Add Flexeril 5mg  daily.

## 2015-07-18 NOTE — Assessment & Plan Note (Signed)
Complained chest pain 05/29/15, relieved after Mylanta 30ccx1, continue Omeprazole 20mg 

## 2015-07-18 NOTE — Assessment & Plan Note (Signed)
In setting of migratory aches and pains, will try Cymbalta 20mg  daily, observe the patient.

## 2015-07-18 NOTE — Assessment & Plan Note (Signed)
06/10/15 Hgb 13.1 Update CBC, may consider dc Fe

## 2015-07-18 NOTE — Assessment & Plan Note (Signed)
Saw Urology 06/06/14-continued with Myrbetriq 25mg.  Stress incontinent in natures. 3-4x/night   

## 2015-07-19 DIAGNOSIS — M25559 Pain in unspecified hip: Secondary | ICD-10-CM | POA: Diagnosis not present

## 2015-07-22 DIAGNOSIS — R29898 Other symptoms and signs involving the musculoskeletal system: Secondary | ICD-10-CM | POA: Diagnosis not present

## 2015-07-22 DIAGNOSIS — R2681 Unsteadiness on feet: Secondary | ICD-10-CM | POA: Diagnosis not present

## 2015-07-22 DIAGNOSIS — M6281 Muscle weakness (generalized): Secondary | ICD-10-CM | POA: Diagnosis not present

## 2015-07-22 DIAGNOSIS — R2689 Other abnormalities of gait and mobility: Secondary | ICD-10-CM | POA: Diagnosis not present

## 2015-07-22 DIAGNOSIS — K2971 Gastritis, unspecified, with bleeding: Secondary | ICD-10-CM | POA: Diagnosis not present

## 2015-07-22 DIAGNOSIS — M545 Low back pain: Secondary | ICD-10-CM | POA: Diagnosis not present

## 2015-07-22 DIAGNOSIS — M25552 Pain in left hip: Secondary | ICD-10-CM | POA: Diagnosis not present

## 2015-07-23 DIAGNOSIS — K2971 Gastritis, unspecified, with bleeding: Secondary | ICD-10-CM | POA: Diagnosis not present

## 2015-07-23 DIAGNOSIS — M25552 Pain in left hip: Secondary | ICD-10-CM | POA: Diagnosis not present

## 2015-07-23 DIAGNOSIS — R2681 Unsteadiness on feet: Secondary | ICD-10-CM | POA: Diagnosis not present

## 2015-07-23 DIAGNOSIS — R2689 Other abnormalities of gait and mobility: Secondary | ICD-10-CM | POA: Diagnosis not present

## 2015-07-23 DIAGNOSIS — I10 Essential (primary) hypertension: Secondary | ICD-10-CM | POA: Diagnosis not present

## 2015-07-23 DIAGNOSIS — R29898 Other symptoms and signs involving the musculoskeletal system: Secondary | ICD-10-CM | POA: Diagnosis not present

## 2015-07-23 DIAGNOSIS — M6281 Muscle weakness (generalized): Secondary | ICD-10-CM | POA: Diagnosis not present

## 2015-07-23 LAB — CBC AND DIFFERENTIAL
HCT: 42 % (ref 36–46)
Hemoglobin: 14.1 g/dL (ref 12.0–16.0)
Platelets: 335 10*3/uL (ref 150–399)
WBC: 6.1 10^3/mL

## 2015-07-24 DIAGNOSIS — R29898 Other symptoms and signs involving the musculoskeletal system: Secondary | ICD-10-CM | POA: Diagnosis not present

## 2015-07-24 DIAGNOSIS — R2689 Other abnormalities of gait and mobility: Secondary | ICD-10-CM | POA: Diagnosis not present

## 2015-07-24 DIAGNOSIS — K2971 Gastritis, unspecified, with bleeding: Secondary | ICD-10-CM | POA: Diagnosis not present

## 2015-07-24 DIAGNOSIS — M6281 Muscle weakness (generalized): Secondary | ICD-10-CM | POA: Diagnosis not present

## 2015-07-24 DIAGNOSIS — M25552 Pain in left hip: Secondary | ICD-10-CM | POA: Diagnosis not present

## 2015-07-24 DIAGNOSIS — R2681 Unsteadiness on feet: Secondary | ICD-10-CM | POA: Diagnosis not present

## 2015-07-25 DIAGNOSIS — R2689 Other abnormalities of gait and mobility: Secondary | ICD-10-CM | POA: Diagnosis not present

## 2015-07-25 DIAGNOSIS — R29898 Other symptoms and signs involving the musculoskeletal system: Secondary | ICD-10-CM | POA: Diagnosis not present

## 2015-07-25 DIAGNOSIS — M6281 Muscle weakness (generalized): Secondary | ICD-10-CM | POA: Diagnosis not present

## 2015-07-25 DIAGNOSIS — M25552 Pain in left hip: Secondary | ICD-10-CM | POA: Diagnosis not present

## 2015-07-25 DIAGNOSIS — K2971 Gastritis, unspecified, with bleeding: Secondary | ICD-10-CM | POA: Diagnosis not present

## 2015-07-25 DIAGNOSIS — R2681 Unsteadiness on feet: Secondary | ICD-10-CM | POA: Diagnosis not present

## 2015-07-29 DIAGNOSIS — M545 Low back pain: Secondary | ICD-10-CM | POA: Diagnosis not present

## 2015-07-30 DIAGNOSIS — R2689 Other abnormalities of gait and mobility: Secondary | ICD-10-CM | POA: Diagnosis not present

## 2015-07-30 DIAGNOSIS — R29898 Other symptoms and signs involving the musculoskeletal system: Secondary | ICD-10-CM | POA: Diagnosis not present

## 2015-07-30 DIAGNOSIS — M25552 Pain in left hip: Secondary | ICD-10-CM | POA: Diagnosis not present

## 2015-07-30 DIAGNOSIS — R2681 Unsteadiness on feet: Secondary | ICD-10-CM | POA: Diagnosis not present

## 2015-07-30 DIAGNOSIS — M6281 Muscle weakness (generalized): Secondary | ICD-10-CM | POA: Diagnosis not present

## 2015-07-30 DIAGNOSIS — K2971 Gastritis, unspecified, with bleeding: Secondary | ICD-10-CM | POA: Diagnosis not present

## 2015-07-31 DIAGNOSIS — M6281 Muscle weakness (generalized): Secondary | ICD-10-CM | POA: Diagnosis not present

## 2015-07-31 DIAGNOSIS — R2689 Other abnormalities of gait and mobility: Secondary | ICD-10-CM | POA: Diagnosis not present

## 2015-07-31 DIAGNOSIS — M25552 Pain in left hip: Secondary | ICD-10-CM | POA: Diagnosis not present

## 2015-07-31 DIAGNOSIS — R29898 Other symptoms and signs involving the musculoskeletal system: Secondary | ICD-10-CM | POA: Diagnosis not present

## 2015-07-31 DIAGNOSIS — K2971 Gastritis, unspecified, with bleeding: Secondary | ICD-10-CM | POA: Diagnosis not present

## 2015-07-31 DIAGNOSIS — R2681 Unsteadiness on feet: Secondary | ICD-10-CM | POA: Diagnosis not present

## 2015-08-01 DIAGNOSIS — M6281 Muscle weakness (generalized): Secondary | ICD-10-CM | POA: Diagnosis not present

## 2015-08-01 DIAGNOSIS — K2971 Gastritis, unspecified, with bleeding: Secondary | ICD-10-CM | POA: Diagnosis not present

## 2015-08-01 DIAGNOSIS — R2681 Unsteadiness on feet: Secondary | ICD-10-CM | POA: Diagnosis not present

## 2015-08-01 DIAGNOSIS — R2689 Other abnormalities of gait and mobility: Secondary | ICD-10-CM | POA: Diagnosis not present

## 2015-08-01 DIAGNOSIS — R29898 Other symptoms and signs involving the musculoskeletal system: Secondary | ICD-10-CM | POA: Diagnosis not present

## 2015-08-01 DIAGNOSIS — M25552 Pain in left hip: Secondary | ICD-10-CM | POA: Diagnosis not present

## 2015-08-01 DIAGNOSIS — M545 Low back pain: Secondary | ICD-10-CM | POA: Diagnosis not present

## 2015-08-02 DIAGNOSIS — R2689 Other abnormalities of gait and mobility: Secondary | ICD-10-CM | POA: Diagnosis not present

## 2015-08-02 DIAGNOSIS — R2681 Unsteadiness on feet: Secondary | ICD-10-CM | POA: Diagnosis not present

## 2015-08-02 DIAGNOSIS — R29898 Other symptoms and signs involving the musculoskeletal system: Secondary | ICD-10-CM | POA: Diagnosis not present

## 2015-08-02 DIAGNOSIS — M25552 Pain in left hip: Secondary | ICD-10-CM | POA: Diagnosis not present

## 2015-08-02 DIAGNOSIS — K2971 Gastritis, unspecified, with bleeding: Secondary | ICD-10-CM | POA: Diagnosis not present

## 2015-08-02 DIAGNOSIS — M6281 Muscle weakness (generalized): Secondary | ICD-10-CM | POA: Diagnosis not present

## 2015-08-05 DIAGNOSIS — K2971 Gastritis, unspecified, with bleeding: Secondary | ICD-10-CM | POA: Diagnosis not present

## 2015-08-05 DIAGNOSIS — M25552 Pain in left hip: Secondary | ICD-10-CM | POA: Diagnosis not present

## 2015-08-05 DIAGNOSIS — R2681 Unsteadiness on feet: Secondary | ICD-10-CM | POA: Diagnosis not present

## 2015-08-05 DIAGNOSIS — R2689 Other abnormalities of gait and mobility: Secondary | ICD-10-CM | POA: Diagnosis not present

## 2015-08-05 DIAGNOSIS — M6281 Muscle weakness (generalized): Secondary | ICD-10-CM | POA: Diagnosis not present

## 2015-08-05 DIAGNOSIS — R29898 Other symptoms and signs involving the musculoskeletal system: Secondary | ICD-10-CM | POA: Diagnosis not present

## 2015-08-06 DIAGNOSIS — R2681 Unsteadiness on feet: Secondary | ICD-10-CM | POA: Diagnosis not present

## 2015-08-06 DIAGNOSIS — R29898 Other symptoms and signs involving the musculoskeletal system: Secondary | ICD-10-CM | POA: Diagnosis not present

## 2015-08-06 DIAGNOSIS — R2689 Other abnormalities of gait and mobility: Secondary | ICD-10-CM | POA: Diagnosis not present

## 2015-08-06 DIAGNOSIS — M6281 Muscle weakness (generalized): Secondary | ICD-10-CM | POA: Diagnosis not present

## 2015-08-06 DIAGNOSIS — K2971 Gastritis, unspecified, with bleeding: Secondary | ICD-10-CM | POA: Diagnosis not present

## 2015-08-06 DIAGNOSIS — M25552 Pain in left hip: Secondary | ICD-10-CM | POA: Diagnosis not present

## 2015-08-07 DIAGNOSIS — R2681 Unsteadiness on feet: Secondary | ICD-10-CM | POA: Diagnosis not present

## 2015-08-07 DIAGNOSIS — K2971 Gastritis, unspecified, with bleeding: Secondary | ICD-10-CM | POA: Diagnosis not present

## 2015-08-07 DIAGNOSIS — M6281 Muscle weakness (generalized): Secondary | ICD-10-CM | POA: Diagnosis not present

## 2015-08-07 DIAGNOSIS — R2689 Other abnormalities of gait and mobility: Secondary | ICD-10-CM | POA: Diagnosis not present

## 2015-08-07 DIAGNOSIS — M25552 Pain in left hip: Secondary | ICD-10-CM | POA: Diagnosis not present

## 2015-08-07 DIAGNOSIS — R29898 Other symptoms and signs involving the musculoskeletal system: Secondary | ICD-10-CM | POA: Diagnosis not present

## 2015-08-08 DIAGNOSIS — R2681 Unsteadiness on feet: Secondary | ICD-10-CM | POA: Diagnosis not present

## 2015-08-08 DIAGNOSIS — R29898 Other symptoms and signs involving the musculoskeletal system: Secondary | ICD-10-CM | POA: Diagnosis not present

## 2015-08-08 DIAGNOSIS — K2971 Gastritis, unspecified, with bleeding: Secondary | ICD-10-CM | POA: Diagnosis not present

## 2015-08-08 DIAGNOSIS — M25552 Pain in left hip: Secondary | ICD-10-CM | POA: Diagnosis not present

## 2015-08-08 DIAGNOSIS — R2689 Other abnormalities of gait and mobility: Secondary | ICD-10-CM | POA: Diagnosis not present

## 2015-08-08 DIAGNOSIS — M6281 Muscle weakness (generalized): Secondary | ICD-10-CM | POA: Diagnosis not present

## 2015-08-09 DIAGNOSIS — M6281 Muscle weakness (generalized): Secondary | ICD-10-CM | POA: Diagnosis not present

## 2015-08-09 DIAGNOSIS — K2971 Gastritis, unspecified, with bleeding: Secondary | ICD-10-CM | POA: Diagnosis not present

## 2015-08-09 DIAGNOSIS — R2689 Other abnormalities of gait and mobility: Secondary | ICD-10-CM | POA: Diagnosis not present

## 2015-08-09 DIAGNOSIS — R2681 Unsteadiness on feet: Secondary | ICD-10-CM | POA: Diagnosis not present

## 2015-08-09 DIAGNOSIS — R29898 Other symptoms and signs involving the musculoskeletal system: Secondary | ICD-10-CM | POA: Diagnosis not present

## 2015-08-09 DIAGNOSIS — M25552 Pain in left hip: Secondary | ICD-10-CM | POA: Diagnosis not present

## 2015-08-12 DIAGNOSIS — K2971 Gastritis, unspecified, with bleeding: Secondary | ICD-10-CM | POA: Diagnosis not present

## 2015-08-12 DIAGNOSIS — S82001D Unspecified fracture of right patella, subsequent encounter for closed fracture with routine healing: Secondary | ICD-10-CM | POA: Diagnosis not present

## 2015-08-12 DIAGNOSIS — M25552 Pain in left hip: Secondary | ICD-10-CM | POA: Diagnosis not present

## 2015-08-12 DIAGNOSIS — M4806 Spinal stenosis, lumbar region: Secondary | ICD-10-CM | POA: Diagnosis not present

## 2015-08-12 DIAGNOSIS — R2681 Unsteadiness on feet: Secondary | ICD-10-CM | POA: Diagnosis not present

## 2015-08-12 DIAGNOSIS — M6281 Muscle weakness (generalized): Secondary | ICD-10-CM | POA: Diagnosis not present

## 2015-08-12 DIAGNOSIS — W19XXXA Unspecified fall, initial encounter: Secondary | ICD-10-CM | POA: Diagnosis not present

## 2015-08-12 DIAGNOSIS — H81399 Other peripheral vertigo, unspecified ear: Secondary | ICD-10-CM | POA: Diagnosis not present

## 2015-08-12 DIAGNOSIS — R269 Unspecified abnormalities of gait and mobility: Secondary | ICD-10-CM | POA: Diagnosis not present

## 2015-08-12 DIAGNOSIS — Z9181 History of falling: Secondary | ICD-10-CM | POA: Diagnosis not present

## 2015-08-12 DIAGNOSIS — R29898 Other symptoms and signs involving the musculoskeletal system: Secondary | ICD-10-CM | POA: Diagnosis not present

## 2015-08-12 DIAGNOSIS — R2689 Other abnormalities of gait and mobility: Secondary | ICD-10-CM | POA: Diagnosis not present

## 2015-08-13 DIAGNOSIS — M6281 Muscle weakness (generalized): Secondary | ICD-10-CM | POA: Diagnosis not present

## 2015-08-13 DIAGNOSIS — R29898 Other symptoms and signs involving the musculoskeletal system: Secondary | ICD-10-CM | POA: Diagnosis not present

## 2015-08-13 DIAGNOSIS — M25552 Pain in left hip: Secondary | ICD-10-CM | POA: Diagnosis not present

## 2015-08-13 DIAGNOSIS — K2971 Gastritis, unspecified, with bleeding: Secondary | ICD-10-CM | POA: Diagnosis not present

## 2015-08-13 DIAGNOSIS — R2681 Unsteadiness on feet: Secondary | ICD-10-CM | POA: Diagnosis not present

## 2015-08-13 DIAGNOSIS — R2689 Other abnormalities of gait and mobility: Secondary | ICD-10-CM | POA: Diagnosis not present

## 2015-08-14 DIAGNOSIS — R2689 Other abnormalities of gait and mobility: Secondary | ICD-10-CM | POA: Diagnosis not present

## 2015-08-14 DIAGNOSIS — R29898 Other symptoms and signs involving the musculoskeletal system: Secondary | ICD-10-CM | POA: Diagnosis not present

## 2015-08-14 DIAGNOSIS — M25552 Pain in left hip: Secondary | ICD-10-CM | POA: Diagnosis not present

## 2015-08-14 DIAGNOSIS — R2681 Unsteadiness on feet: Secondary | ICD-10-CM | POA: Diagnosis not present

## 2015-08-14 DIAGNOSIS — M6281 Muscle weakness (generalized): Secondary | ICD-10-CM | POA: Diagnosis not present

## 2015-08-14 DIAGNOSIS — K2971 Gastritis, unspecified, with bleeding: Secondary | ICD-10-CM | POA: Diagnosis not present

## 2015-08-15 ENCOUNTER — Encounter: Payer: Medicare Other | Admitting: Nurse Practitioner

## 2015-08-15 DIAGNOSIS — R29898 Other symptoms and signs involving the musculoskeletal system: Secondary | ICD-10-CM | POA: Diagnosis not present

## 2015-08-15 DIAGNOSIS — K2971 Gastritis, unspecified, with bleeding: Secondary | ICD-10-CM | POA: Diagnosis not present

## 2015-08-15 DIAGNOSIS — R2689 Other abnormalities of gait and mobility: Secondary | ICD-10-CM | POA: Diagnosis not present

## 2015-08-15 DIAGNOSIS — M25552 Pain in left hip: Secondary | ICD-10-CM | POA: Diagnosis not present

## 2015-08-15 DIAGNOSIS — R2681 Unsteadiness on feet: Secondary | ICD-10-CM | POA: Diagnosis not present

## 2015-08-15 DIAGNOSIS — M6281 Muscle weakness (generalized): Secondary | ICD-10-CM | POA: Diagnosis not present

## 2015-08-16 DIAGNOSIS — R2689 Other abnormalities of gait and mobility: Secondary | ICD-10-CM | POA: Diagnosis not present

## 2015-08-16 DIAGNOSIS — R29898 Other symptoms and signs involving the musculoskeletal system: Secondary | ICD-10-CM | POA: Diagnosis not present

## 2015-08-16 DIAGNOSIS — M6281 Muscle weakness (generalized): Secondary | ICD-10-CM | POA: Diagnosis not present

## 2015-08-16 DIAGNOSIS — M25552 Pain in left hip: Secondary | ICD-10-CM | POA: Diagnosis not present

## 2015-08-16 DIAGNOSIS — R2681 Unsteadiness on feet: Secondary | ICD-10-CM | POA: Diagnosis not present

## 2015-08-16 DIAGNOSIS — K2971 Gastritis, unspecified, with bleeding: Secondary | ICD-10-CM | POA: Diagnosis not present

## 2015-08-19 DIAGNOSIS — R29898 Other symptoms and signs involving the musculoskeletal system: Secondary | ICD-10-CM | POA: Diagnosis not present

## 2015-08-19 DIAGNOSIS — M6281 Muscle weakness (generalized): Secondary | ICD-10-CM | POA: Diagnosis not present

## 2015-08-19 DIAGNOSIS — R2689 Other abnormalities of gait and mobility: Secondary | ICD-10-CM | POA: Diagnosis not present

## 2015-08-19 DIAGNOSIS — K2971 Gastritis, unspecified, with bleeding: Secondary | ICD-10-CM | POA: Diagnosis not present

## 2015-08-19 DIAGNOSIS — R2681 Unsteadiness on feet: Secondary | ICD-10-CM | POA: Diagnosis not present

## 2015-08-19 DIAGNOSIS — M25552 Pain in left hip: Secondary | ICD-10-CM | POA: Diagnosis not present

## 2015-08-21 DIAGNOSIS — R2689 Other abnormalities of gait and mobility: Secondary | ICD-10-CM | POA: Diagnosis not present

## 2015-08-21 DIAGNOSIS — R29898 Other symptoms and signs involving the musculoskeletal system: Secondary | ICD-10-CM | POA: Diagnosis not present

## 2015-08-21 DIAGNOSIS — R2681 Unsteadiness on feet: Secondary | ICD-10-CM | POA: Diagnosis not present

## 2015-08-21 DIAGNOSIS — M25552 Pain in left hip: Secondary | ICD-10-CM | POA: Diagnosis not present

## 2015-08-21 DIAGNOSIS — M6281 Muscle weakness (generalized): Secondary | ICD-10-CM | POA: Diagnosis not present

## 2015-08-21 DIAGNOSIS — K2971 Gastritis, unspecified, with bleeding: Secondary | ICD-10-CM | POA: Diagnosis not present

## 2015-08-22 DIAGNOSIS — M25552 Pain in left hip: Secondary | ICD-10-CM | POA: Diagnosis not present

## 2015-08-22 DIAGNOSIS — K2971 Gastritis, unspecified, with bleeding: Secondary | ICD-10-CM | POA: Diagnosis not present

## 2015-08-22 DIAGNOSIS — R2681 Unsteadiness on feet: Secondary | ICD-10-CM | POA: Diagnosis not present

## 2015-08-22 DIAGNOSIS — M6281 Muscle weakness (generalized): Secondary | ICD-10-CM | POA: Diagnosis not present

## 2015-08-22 DIAGNOSIS — M5416 Radiculopathy, lumbar region: Secondary | ICD-10-CM | POA: Diagnosis not present

## 2015-08-22 DIAGNOSIS — M545 Low back pain: Secondary | ICD-10-CM | POA: Diagnosis not present

## 2015-08-22 DIAGNOSIS — R2689 Other abnormalities of gait and mobility: Secondary | ICD-10-CM | POA: Diagnosis not present

## 2015-08-22 DIAGNOSIS — R29898 Other symptoms and signs involving the musculoskeletal system: Secondary | ICD-10-CM | POA: Diagnosis not present

## 2015-08-22 DIAGNOSIS — M47817 Spondylosis without myelopathy or radiculopathy, lumbosacral region: Secondary | ICD-10-CM | POA: Diagnosis not present

## 2015-08-26 DIAGNOSIS — M6281 Muscle weakness (generalized): Secondary | ICD-10-CM | POA: Diagnosis not present

## 2015-08-26 DIAGNOSIS — K2971 Gastritis, unspecified, with bleeding: Secondary | ICD-10-CM | POA: Diagnosis not present

## 2015-08-26 DIAGNOSIS — R29898 Other symptoms and signs involving the musculoskeletal system: Secondary | ICD-10-CM | POA: Diagnosis not present

## 2015-08-26 DIAGNOSIS — R2681 Unsteadiness on feet: Secondary | ICD-10-CM | POA: Diagnosis not present

## 2015-08-26 DIAGNOSIS — M25552 Pain in left hip: Secondary | ICD-10-CM | POA: Diagnosis not present

## 2015-08-26 DIAGNOSIS — R2689 Other abnormalities of gait and mobility: Secondary | ICD-10-CM | POA: Diagnosis not present

## 2015-08-28 DIAGNOSIS — R2681 Unsteadiness on feet: Secondary | ICD-10-CM | POA: Diagnosis not present

## 2015-08-28 DIAGNOSIS — R2689 Other abnormalities of gait and mobility: Secondary | ICD-10-CM | POA: Diagnosis not present

## 2015-08-28 DIAGNOSIS — M25552 Pain in left hip: Secondary | ICD-10-CM | POA: Diagnosis not present

## 2015-08-28 DIAGNOSIS — K2971 Gastritis, unspecified, with bleeding: Secondary | ICD-10-CM | POA: Diagnosis not present

## 2015-08-28 DIAGNOSIS — R29898 Other symptoms and signs involving the musculoskeletal system: Secondary | ICD-10-CM | POA: Diagnosis not present

## 2015-08-28 DIAGNOSIS — M6281 Muscle weakness (generalized): Secondary | ICD-10-CM | POA: Diagnosis not present

## 2015-08-29 ENCOUNTER — Encounter: Payer: Self-pay | Admitting: Nurse Practitioner

## 2015-08-29 DIAGNOSIS — R29898 Other symptoms and signs involving the musculoskeletal system: Secondary | ICD-10-CM | POA: Diagnosis not present

## 2015-08-29 DIAGNOSIS — K2971 Gastritis, unspecified, with bleeding: Secondary | ICD-10-CM | POA: Diagnosis not present

## 2015-08-29 DIAGNOSIS — M6281 Muscle weakness (generalized): Secondary | ICD-10-CM | POA: Diagnosis not present

## 2015-08-29 DIAGNOSIS — M25552 Pain in left hip: Secondary | ICD-10-CM | POA: Diagnosis not present

## 2015-08-29 DIAGNOSIS — R2681 Unsteadiness on feet: Secondary | ICD-10-CM | POA: Diagnosis not present

## 2015-08-29 DIAGNOSIS — R2689 Other abnormalities of gait and mobility: Secondary | ICD-10-CM | POA: Diagnosis not present

## 2015-08-30 DIAGNOSIS — M5416 Radiculopathy, lumbar region: Secondary | ICD-10-CM | POA: Diagnosis not present

## 2015-08-30 DIAGNOSIS — M545 Low back pain: Secondary | ICD-10-CM | POA: Diagnosis not present

## 2015-09-03 DIAGNOSIS — R2689 Other abnormalities of gait and mobility: Secondary | ICD-10-CM | POA: Diagnosis not present

## 2015-09-03 DIAGNOSIS — M25552 Pain in left hip: Secondary | ICD-10-CM | POA: Diagnosis not present

## 2015-09-03 DIAGNOSIS — R29898 Other symptoms and signs involving the musculoskeletal system: Secondary | ICD-10-CM | POA: Diagnosis not present

## 2015-09-03 DIAGNOSIS — K2971 Gastritis, unspecified, with bleeding: Secondary | ICD-10-CM | POA: Diagnosis not present

## 2015-09-03 DIAGNOSIS — M6281 Muscle weakness (generalized): Secondary | ICD-10-CM | POA: Diagnosis not present

## 2015-09-03 DIAGNOSIS — R2681 Unsteadiness on feet: Secondary | ICD-10-CM | POA: Diagnosis not present

## 2015-09-04 DIAGNOSIS — R2681 Unsteadiness on feet: Secondary | ICD-10-CM | POA: Diagnosis not present

## 2015-09-04 DIAGNOSIS — R29898 Other symptoms and signs involving the musculoskeletal system: Secondary | ICD-10-CM | POA: Diagnosis not present

## 2015-09-04 DIAGNOSIS — M6281 Muscle weakness (generalized): Secondary | ICD-10-CM | POA: Diagnosis not present

## 2015-09-04 DIAGNOSIS — R2689 Other abnormalities of gait and mobility: Secondary | ICD-10-CM | POA: Diagnosis not present

## 2015-09-04 DIAGNOSIS — M25552 Pain in left hip: Secondary | ICD-10-CM | POA: Diagnosis not present

## 2015-09-04 DIAGNOSIS — K2971 Gastritis, unspecified, with bleeding: Secondary | ICD-10-CM | POA: Diagnosis not present

## 2015-09-19 ENCOUNTER — Encounter: Payer: Self-pay | Admitting: Nurse Practitioner

## 2015-09-19 ENCOUNTER — Non-Acute Institutional Stay: Payer: Medicare Other | Admitting: Nurse Practitioner

## 2015-09-19 DIAGNOSIS — K219 Gastro-esophageal reflux disease without esophagitis: Secondary | ICD-10-CM | POA: Diagnosis not present

## 2015-09-19 DIAGNOSIS — D75839 Thrombocytosis, unspecified: Secondary | ICD-10-CM

## 2015-09-19 DIAGNOSIS — F4323 Adjustment disorder with mixed anxiety and depressed mood: Secondary | ICD-10-CM

## 2015-09-19 DIAGNOSIS — D649 Anemia, unspecified: Secondary | ICD-10-CM

## 2015-09-19 DIAGNOSIS — K5901 Slow transit constipation: Secondary | ICD-10-CM | POA: Insufficient documentation

## 2015-09-19 DIAGNOSIS — K5900 Constipation, unspecified: Secondary | ICD-10-CM

## 2015-09-19 DIAGNOSIS — G8929 Other chronic pain: Secondary | ICD-10-CM | POA: Diagnosis not present

## 2015-09-19 DIAGNOSIS — M545 Low back pain, unspecified: Secondary | ICD-10-CM

## 2015-09-19 DIAGNOSIS — I10 Essential (primary) hypertension: Secondary | ICD-10-CM | POA: Diagnosis not present

## 2015-09-19 DIAGNOSIS — D473 Essential (hemorrhagic) thrombocythemia: Secondary | ICD-10-CM

## 2015-09-19 DIAGNOSIS — N318 Other neuromuscular dysfunction of bladder: Secondary | ICD-10-CM

## 2015-09-19 NOTE — Progress Notes (Signed)
Patient ID: Brandy Crawford, female   DOB: 1923-11-12, 82 y.o.   MRN: [id number]  Location:  [place] Room Number: [place] of Service: [place] [facility] Provider:  [facility] Meribeth Vitug NP  GREEN, Viviann Spare, MD  Patient Care Team: Estill Dooms, MD as PCP - General (Internal Medicine) Marquisa Salih Otho Darner, NP as Nurse Practitioner (Nurse Practitioner)  Extended Emergency Contact Information Primary Emergency Contact: [name] Address: [place], [place] [address] [name] of [place] Phone: [phone] Work Phone: [phone] Mobile Phone: [phone] Relation: Daughter Secondary Emergency Contact: [name] Address: [address] [place] [address] [name] of [place] Phone: [phone] Mobile Phone: [phone] Relation: Son  Code Status:  DNR Goals of care: Advanced Directive information Advanced Directives 09/19/2015  Does patient have an advance directive? Yes  Type of Paramedic of [place];Out of facility DNR (pink MOST or yellow form)  Does patient want to make changes to advanced directive? No - Patient declined  Copy of advanced directive(s) in chart? Yes     Chief Complaint  Patient presents with  . Medical Management of Chronic Issues    HPI:  Pt is a 82 y.o. female seen today for medical management of chronic diseases.  Left hip pain, ambulates with walker, Ortho consult next Monday. Taking Naproxen 220 bid, Tylenol 650mg  qhr prn. Blood pressure is controlled Amlodipine 5mg , Losartan 100mg . Urinary frequent, managed with Myrbetriq 25mg , mood is stable Cymbalta 20mg .    05/30/15 to 06/03/15 hospitalized for Sepsis due to pneumonia  Past Medical History  Diagnosis Date  . Asthma   . GERD (gastroesophageal reflux disease)   . Hypertension   . TIA (transient ischemic attack) 09/20/2006  . Cervical disc disease   . Anemia   . Depression   . Lumbosacral  spondylosis 10/28/2009  . Abnormality of gait 04/28/2010    History of fall onto right shoulder-has participated in PT. Uses walker.     . Acute lower GI bleeding 10/30/2014  . Fall at nursing home 10/25/2014  . History of cardiovascular disorder 09/20/2006    X 3 in 2000 now on aspirin.    Marland Kitchen Hyperlipidemia 09/20/2006    05/15/14 LDL 95   . OSTEOPOROSIS 09/20/2006    Noted 2008    . Overactive bladder 11/29/2013    Myrbetriq prescribed by urology.    . SCOLIOSIS 10/28/2009  . Diarrhea 10/25/14  . Acute encephalopathy 10/25/14    Occurred when septic  . Diverticulosis 10/27/2014  . Cerebrovascular disease 10/27/2014    Small vessel disease  . Cerebral atrophy 10/27/2014  . Aortic atherosclerosis ([place]) 10/27/2014  . Degenerative disc disease, lumbar 10/27/2014  . Scoliosis 10/27/2014  . Protein calorie malnutrition ([place])     06/06/15 TP 6.0 albumin 2.9   Past Surgical History  Procedure Laterality Date  . Appendectomy    . Abdominal hysterectomy    . Tonsillectomy    . Cholecystectomy      Allergies  Allergen Reactions  . Tramadol Other (See Comments)    Hallucinations, agitation/combativeness  . Amoxicillin     Per MAR  . Oxycodone Other (See Comments)    Per MAR   . Sulfamethoxazole     Per MAR   . Penicillins Rash      Medication List       This  list is accurate as of: 09/19/15 11:59 PM.  Always use your most recent med list.               acetaminophen 325 MG tablet  Commonly known as:  TYLENOL  Take 650 mg by mouth every 4 (four) hours as needed for moderate pain or headache.     albuterol 108 (90 Base) MCG/ACT inhaler  Commonly known as:  PROVENTIL HFA;VENTOLIN HFA  Inhale 2 puffs into the lungs every 6 (six) hours as needed for wheezing or shortness of breath.     alum & mag hydroxide-simeth 200-200-20 MG/5ML suspension  Commonly known as:  MAALOX/MYLANTA  Take 30 mLs by mouth as needed for indigestion or heartburn.     amLODipine 5 MG tablet  Commonly known  as:  NORVASC  Take 5 mg by mouth daily with breakfast.     cholestyramine 4 g packet  Commonly known as:  QUESTRAN  Take 4 g by mouth 2 (two) times daily.     DULoxetine 20 MG capsule  Commonly known as:  CYMBALTA  Take 20 mg by mouth daily.     ferrous sulfate 325 (65 FE) MG tablet  Take 325 mg by mouth daily.     loperamide 2 MG capsule  Commonly known as:  IMODIUM  Take one to two tablets by mouth as needed for diarrhea, Max 16 mg/24 hours call MD if Diarrhea continues     losartan 100 MG tablet  Commonly known as:  COZAAR  Take 100 mg by mouth daily with breakfast.     MYRBETRIQ 25 MG Tb24 tablet  Generic drug:  mirabegron ER  Take 25 mg by mouth daily with breakfast. Prescribed by urology     naproxen sodium 220 MG tablet  Commonly known as:  ANAPROX  Take 220 mg by mouth 2 (two) times daily with a meal.     nitroGLYCERIN 0.4 MG SL tablet  Commonly known as:  NITROSTAT  Place 0.4 mg under the tongue every 5 (five) minutes as needed for chest pain. After taking three times and no relief call MD.     omeprazole 20 MG capsule  Commonly known as:  PRILOSEC  Take 20 mg by mouth daily.     pyridOXINE 100 MG tablet  Commonly known as:  VITAMIN B-6  Take 100 mg by mouth daily with breakfast.        Review of Systems  Constitutional: Negative for fever, chills and diaphoresis.  HENT: Positive for hearing loss. Negative for congestion, ear discharge, ear pain, nosebleeds, sore throat and tinnitus.   Eyes: Negative for photophobia, pain, discharge and redness.  Respiratory: Positive for cough. Negative for shortness of breath, wheezing and stridor.        Resolved congestive cough, yellow phlegm   Cardiovascular: Positive for leg swelling. Negative for chest pain and palpitations.       Trace edema BLE  Gastrointestinal: Negative for nausea, vomiting, abdominal pain, diarrhea (Under control with Questran and Imodium), constipation and blood in stool.  Endocrine:  Negative for polydipsia.  Genitourinary: Positive for frequency. Negative for dysuria, urgency, hematuria and flank pain.       1-2x/night. Stress incontinence  Musculoskeletal: Positive for back pain, arthralgias and gait problem. Negative for myalgias and neck pain.       Left lower back, left hip, left thigh pain with weight bearing and position changes Chest pain was relieved after Mylanta 05/28/15 Neck pain 05/29/15 Right upper back pin 05/30/15  Skin: Negative.  Negative for rash.  Allergic/Immunologic: Negative for environmental allergies.  Neurological: Negative for dizziness, tremors, seizures, weakness and headaches.       Memory loss. Patient had delirium when septic.  Hematological: Does not bruise/bleed easily.       Anemic secondary to acute blood loss from lower GI bleeding  Psychiatric/Behavioral: Negative for suicidal ideas, hallucinations, confusion and agitation. The patient is nervous/anxious.     Immunization History  Administered Date(s) Administered  . HiB (PRP-OMP) 02/12/2004  . Influenza Split 01/11/2012  . Influenza Whole 01/07/2009  . Influenza-Unspecified 01/11/2014, 01/01/2015  . PPD Test 11/14/2014, 06/03/2015, 06/17/2015  . Pneumococcal Polysaccharide-23 04/28/2010  . Tdap 08/24/2011  . Zoster 04/13/2010   Pertinent  Health Maintenance Due  Topic Date Due  . DEXA SCAN  01/28/1991  . PNA vac Low Risk Adult (2 of 2 - PCV13) 04/29/2011  . INFLUENZA VACCINE  11/12/2015   Fall Risk  06/20/2015 05/23/2015 11/09/2014 05/10/2014  Falls in the past year? No No No No   Functional Status Survey:    Filed Vitals:   09/19/15 1033  BP: 130/70  Pulse: 76  Temp: 97.6 F (36.4 C)  TempSrc: Oral  Resp: 18  Height: 5\' 6"  (1.676 m)  Weight: 144 lb 3.2 oz (65.409 kg)   Body mass index is 23.29 kg/(m^2). Physical Exam  Constitutional: She is oriented to person, place, and time. She appears well-developed and well-nourished. No distress.  HENT:  Head:  Normocephalic and atraumatic.  Right Ear: No lacerations. No drainage. No foreign bodies. No mastoid tenderness. Tympanic membrane is not injected, not scarred, not perforated, not erythematous, not retracted and not bulging. Tympanic membrane mobility is abnormal. No middle ear effusion. Decreased hearing is noted.  Left Ear: No lacerations. No drainage. No foreign bodies. No mastoid tenderness. Tympanic membrane is not injected, not scarred, not perforated, not erythematous, not retracted and not bulging. Tympanic membrane mobility is abnormal.  No middle ear effusion. Decreased hearing is noted.  Nose: Nose normal.  Mouth/Throat: Oropharynx is clear and moist. No oropharyngeal exudate.  Eyes: Conjunctivae and EOM are normal. Pupils are equal, round, and reactive to light. Right eye exhibits no discharge. Left eye exhibits no discharge. No scleral icterus.  Neck: Normal range of motion. Neck supple. No JVD present. No tracheal deviation present. No thyromegaly present.  Cardiovascular: Normal rate, regular rhythm, normal heart sounds and intact distal pulses.  Exam reveals no gallop and no friction rub.   No murmur heard. Pulmonary/Chest: Effort normal. No stridor. No respiratory distress. She has no wheezes. She has no rales. She exhibits no tenderness.  Coarse rales posterior mid to lower lungs.  Abdominal: Soft. Bowel sounds are normal. She exhibits no distension and no mass. There is no tenderness. There is no rebound and no guarding.  Genitourinary: Guaiac negative stool.  Musculoskeletal: She exhibits edema and tenderness.  Left lower back, left hip, left thigh pain with weight bearing and position changes Chest pain was relieved after Mylanta 05/28/15 Neck pain 05/29/15 Right upper back pin 05/30/15 06/04/15 mid to lower back pain, more on the right side BLE trace edema  Lymphadenopathy:    She has no cervical adenopathy.  Neurological: She is alert and oriented to person, place, and time.  She has normal reflexes. No cranial nerve deficit. She exhibits normal muscle tone. Coordination normal.  Skin: Skin is warm and dry. No rash noted. She is not diaphoretic. No erythema. No pallor.  Lots of moles.  Psychiatric: Her behavior is normal. Thought content normal.    Labs reviewed:  Recent Labs  06/01/15 0552 06/02/15 0520 06/03/15 0444 06/06/15 06/10/15  NA 139 135 135  --  139  K 3.5 3.8 3.9  --  4.5  CL 104 100* 102  --   --   CO2 26 27 25   --   --   GLUCOSE 109* 118* 117*  --   --   BUN 9 16 14 19 10   CREATININE 0.50 0.67 0.60 0.8 0.6  CALCIUM 9.8 9.5 9.6  --   --     Recent Labs  10/25/14 0714 05/14/15 05/30/15 1248 06/06/15  AST 23 16 24 16   ALT 22 15 28 19   ALKPHOS 55 59 69 71  BILITOT 0.9  --  1.1  --   PROT 6.4*  --  7.4  --   ALBUMIN 3.7  --  3.6  --     Recent Labs  10/25/14 0714  05/30/15 1237  06/01/15 0552 06/02/15 0520 06/03/15 0444 06/06/15 06/10/15 07/23/15  WBC 20.2*  < > 15.3*  < > 10.3 10.7* 9.2 8.4 7.3 6.1  NEUTROABS 19.1*  --  12.4*  --   --   --   --   --   --   --   HGB 11.0*  < > 12.9  < > 12.9 12.3 12.1 12.4 13.1 14.1  HCT 32.7*  < > 38.3  < > 38.7 36.6 36.4 37 39 42  MCV 92.1  < > 93.6  < > 94.2 92.4 94.3  --   --   --   PLT 333  < > 415*  < > 456* 486* 535* 565* 602* 335  < > = values in this interval not displayed. Lab Results  Component Value Date   TSH 2.170 10/29/2014   Lab Results  Component Value Date   HGBA1C 5.9 08/24/2011   Lab Results  Component Value Date   CHOL 176 05/15/2014   HDL 62 05/15/2014   LDLCALC 95 05/15/2014   LDLDIRECT 114.3 12/08/2007   TRIG 93 05/15/2014   CHOLHDL 2.9 CALC 12/08/2007    Significant Diagnostic Results in last 30 days:  No results found.  Assessment/Plan  Essential hypertension Controlled, continue Amlodipine 5mg , Losartan 100mg   GERD (gastroesophageal reflux disease) Complained chest pain 05/29/15, relieved after Mylanta 30ccx1, continue Omeprazole  20mg   Hypertonicity of bladder Saw Urology 06/06/14-continued with Myrbetriq 25mg .  Stress incontinent in natures. 3-4x/night  Thrombocytosis ([place]) 07/23/15 Plt 335  Adjustment disorder with mixed anxiety and depressed mood In setting of migratory aches and pains, stabilized, continue Cymbalta 20mg  daily, observe the patient.     Chronic lower back pain Including lumbar spondylosis. Regular f/u Dr. French Ana. Receives injections last 01/2013.  05/27/15 Xray left hip/pelvis: spondylotic changes, severe osteoarthritis Continue Tylenol and Tramadol prn for pain, observe the patient, adding Cymbalta may help. Mid to lower back pain more on the right side back, may consider X-ray of thoracic spine. 06/01/15 CT thoracic spine showed no acute changes.    Anemia 07/23/15 Hgb 14.1  Constipation MiraLax daily. Observe.     Family/ staff Communication: continue AL  Labs/tests ordered: none

## 2015-09-19 NOTE — Assessment & Plan Note (Signed)
Controlled, continue Amlodipine 5mg, Losartan 100mg 

## 2015-09-19 NOTE — Assessment & Plan Note (Signed)
07/23/15 Hgb 14.1

## 2015-09-19 NOTE — Assessment & Plan Note (Signed)
In setting of migratory aches and pains, stabilized, continue Cymbalta 20mg daily, observe the patient.  

## 2015-09-19 NOTE — Assessment & Plan Note (Signed)
Complained chest pain 05/29/15, relieved after Mylanta 30ccx1, continue Omeprazole 20mg 

## 2015-09-19 NOTE — Assessment & Plan Note (Signed)
07/23/15 Plt 335

## 2015-09-19 NOTE — Assessment & Plan Note (Signed)
Saw Urology 06/06/14-continued with Myrbetriq 25mg.  Stress incontinent in natures. 3-4x/night   

## 2015-09-19 NOTE — Assessment & Plan Note (Signed)
Including lumbar spondylosis. Regular f/u Dr. French Ana. Receives injections last 01/2013.  05/27/15 Xray left hip/pelvis: spondylotic changes, severe osteoarthritis Continue Tylenol and Tramadol prn for pain, observe the patient, adding Cymbalta may help. Mid to lower back pain more on the right side back, may consider X-ray of thoracic spine. 06/01/15 CT thoracic spine showed no acute changes.

## 2015-09-19 NOTE — Assessment & Plan Note (Signed)
MiraLax daily. Observe.

## 2015-10-01 DIAGNOSIS — M5416 Radiculopathy, lumbar region: Secondary | ICD-10-CM | POA: Diagnosis not present

## 2015-10-01 DIAGNOSIS — M47817 Spondylosis without myelopathy or radiculopathy, lumbosacral region: Secondary | ICD-10-CM | POA: Diagnosis not present

## 2015-10-01 DIAGNOSIS — M545 Low back pain: Secondary | ICD-10-CM | POA: Diagnosis not present

## 2015-10-03 DIAGNOSIS — M6281 Muscle weakness (generalized): Secondary | ICD-10-CM | POA: Diagnosis not present

## 2015-10-03 DIAGNOSIS — R2689 Other abnormalities of gait and mobility: Secondary | ICD-10-CM | POA: Diagnosis not present

## 2015-10-03 DIAGNOSIS — R29898 Other symptoms and signs involving the musculoskeletal system: Secondary | ICD-10-CM | POA: Diagnosis not present

## 2015-10-03 DIAGNOSIS — W19XXXA Unspecified fall, initial encounter: Secondary | ICD-10-CM | POA: Diagnosis not present

## 2015-10-03 DIAGNOSIS — K2971 Gastritis, unspecified, with bleeding: Secondary | ICD-10-CM | POA: Diagnosis not present

## 2015-10-03 DIAGNOSIS — M545 Low back pain: Secondary | ICD-10-CM | POA: Diagnosis not present

## 2015-10-03 DIAGNOSIS — H81399 Other peripheral vertigo, unspecified ear: Secondary | ICD-10-CM | POA: Diagnosis not present

## 2015-10-03 DIAGNOSIS — S82001D Unspecified fracture of right patella, subsequent encounter for closed fracture with routine healing: Secondary | ICD-10-CM | POA: Diagnosis not present

## 2015-10-03 DIAGNOSIS — M4806 Spinal stenosis, lumbar region: Secondary | ICD-10-CM | POA: Diagnosis not present

## 2015-10-03 DIAGNOSIS — R269 Unspecified abnormalities of gait and mobility: Secondary | ICD-10-CM | POA: Diagnosis not present

## 2015-10-03 DIAGNOSIS — Z9181 History of falling: Secondary | ICD-10-CM | POA: Diagnosis not present

## 2015-10-03 DIAGNOSIS — R2681 Unsteadiness on feet: Secondary | ICD-10-CM | POA: Diagnosis not present

## 2015-10-03 DIAGNOSIS — M25552 Pain in left hip: Secondary | ICD-10-CM | POA: Diagnosis not present

## 2015-10-08 DIAGNOSIS — M25552 Pain in left hip: Secondary | ICD-10-CM | POA: Diagnosis not present

## 2015-10-08 DIAGNOSIS — R29898 Other symptoms and signs involving the musculoskeletal system: Secondary | ICD-10-CM | POA: Diagnosis not present

## 2015-10-08 DIAGNOSIS — M545 Low back pain: Secondary | ICD-10-CM | POA: Diagnosis not present

## 2015-10-08 DIAGNOSIS — K2971 Gastritis, unspecified, with bleeding: Secondary | ICD-10-CM | POA: Diagnosis not present

## 2015-10-08 DIAGNOSIS — R2689 Other abnormalities of gait and mobility: Secondary | ICD-10-CM | POA: Diagnosis not present

## 2015-10-08 DIAGNOSIS — M6281 Muscle weakness (generalized): Secondary | ICD-10-CM | POA: Diagnosis not present

## 2015-10-10 DIAGNOSIS — R29898 Other symptoms and signs involving the musculoskeletal system: Secondary | ICD-10-CM | POA: Diagnosis not present

## 2015-10-10 DIAGNOSIS — R2689 Other abnormalities of gait and mobility: Secondary | ICD-10-CM | POA: Diagnosis not present

## 2015-10-10 DIAGNOSIS — M25552 Pain in left hip: Secondary | ICD-10-CM | POA: Diagnosis not present

## 2015-10-10 DIAGNOSIS — M545 Low back pain: Secondary | ICD-10-CM | POA: Diagnosis not present

## 2015-10-10 DIAGNOSIS — M6281 Muscle weakness (generalized): Secondary | ICD-10-CM | POA: Diagnosis not present

## 2015-10-10 DIAGNOSIS — K2971 Gastritis, unspecified, with bleeding: Secondary | ICD-10-CM | POA: Diagnosis not present

## 2015-10-15 DIAGNOSIS — M545 Low back pain: Secondary | ICD-10-CM | POA: Diagnosis not present

## 2015-10-15 DIAGNOSIS — M4806 Spinal stenosis, lumbar region: Secondary | ICD-10-CM | POA: Diagnosis not present

## 2015-10-15 DIAGNOSIS — M6281 Muscle weakness (generalized): Secondary | ICD-10-CM | POA: Diagnosis not present

## 2015-10-15 DIAGNOSIS — K2971 Gastritis, unspecified, with bleeding: Secondary | ICD-10-CM | POA: Diagnosis not present

## 2015-10-15 DIAGNOSIS — Z9181 History of falling: Secondary | ICD-10-CM | POA: Diagnosis not present

## 2015-10-15 DIAGNOSIS — S82001D Unspecified fracture of right patella, subsequent encounter for closed fracture with routine healing: Secondary | ICD-10-CM | POA: Diagnosis not present

## 2015-10-15 DIAGNOSIS — H81399 Other peripheral vertigo, unspecified ear: Secondary | ICD-10-CM | POA: Diagnosis not present

## 2015-10-15 DIAGNOSIS — R29898 Other symptoms and signs involving the musculoskeletal system: Secondary | ICD-10-CM | POA: Diagnosis not present

## 2015-10-15 DIAGNOSIS — R269 Unspecified abnormalities of gait and mobility: Secondary | ICD-10-CM | POA: Diagnosis not present

## 2015-10-15 DIAGNOSIS — R2689 Other abnormalities of gait and mobility: Secondary | ICD-10-CM | POA: Diagnosis not present

## 2015-10-15 DIAGNOSIS — R2681 Unsteadiness on feet: Secondary | ICD-10-CM | POA: Diagnosis not present

## 2015-10-15 DIAGNOSIS — M25552 Pain in left hip: Secondary | ICD-10-CM | POA: Diagnosis not present

## 2015-10-15 DIAGNOSIS — W19XXXA Unspecified fall, initial encounter: Secondary | ICD-10-CM | POA: Diagnosis not present

## 2015-10-17 DIAGNOSIS — R29898 Other symptoms and signs involving the musculoskeletal system: Secondary | ICD-10-CM | POA: Diagnosis not present

## 2015-10-17 DIAGNOSIS — M25552 Pain in left hip: Secondary | ICD-10-CM | POA: Diagnosis not present

## 2015-10-17 DIAGNOSIS — M545 Low back pain: Secondary | ICD-10-CM | POA: Diagnosis not present

## 2015-10-17 DIAGNOSIS — M6281 Muscle weakness (generalized): Secondary | ICD-10-CM | POA: Diagnosis not present

## 2015-10-17 DIAGNOSIS — R2689 Other abnormalities of gait and mobility: Secondary | ICD-10-CM | POA: Diagnosis not present

## 2015-10-17 DIAGNOSIS — K2971 Gastritis, unspecified, with bleeding: Secondary | ICD-10-CM | POA: Diagnosis not present

## 2015-10-22 DIAGNOSIS — K2971 Gastritis, unspecified, with bleeding: Secondary | ICD-10-CM | POA: Diagnosis not present

## 2015-10-22 DIAGNOSIS — R2689 Other abnormalities of gait and mobility: Secondary | ICD-10-CM | POA: Diagnosis not present

## 2015-10-22 DIAGNOSIS — M545 Low back pain: Secondary | ICD-10-CM | POA: Diagnosis not present

## 2015-10-22 DIAGNOSIS — M25552 Pain in left hip: Secondary | ICD-10-CM | POA: Diagnosis not present

## 2015-10-22 DIAGNOSIS — M6281 Muscle weakness (generalized): Secondary | ICD-10-CM | POA: Diagnosis not present

## 2015-10-22 DIAGNOSIS — R29898 Other symptoms and signs involving the musculoskeletal system: Secondary | ICD-10-CM | POA: Diagnosis not present

## 2015-10-24 DIAGNOSIS — M545 Low back pain: Secondary | ICD-10-CM | POA: Diagnosis not present

## 2015-10-24 DIAGNOSIS — K2971 Gastritis, unspecified, with bleeding: Secondary | ICD-10-CM | POA: Diagnosis not present

## 2015-10-24 DIAGNOSIS — R29898 Other symptoms and signs involving the musculoskeletal system: Secondary | ICD-10-CM | POA: Diagnosis not present

## 2015-10-24 DIAGNOSIS — R2689 Other abnormalities of gait and mobility: Secondary | ICD-10-CM | POA: Diagnosis not present

## 2015-10-24 DIAGNOSIS — M25552 Pain in left hip: Secondary | ICD-10-CM | POA: Diagnosis not present

## 2015-10-24 DIAGNOSIS — M6281 Muscle weakness (generalized): Secondary | ICD-10-CM | POA: Diagnosis not present

## 2015-10-29 DIAGNOSIS — R29898 Other symptoms and signs involving the musculoskeletal system: Secondary | ICD-10-CM | POA: Diagnosis not present

## 2015-10-29 DIAGNOSIS — M545 Low back pain: Secondary | ICD-10-CM | POA: Diagnosis not present

## 2015-10-29 DIAGNOSIS — K2971 Gastritis, unspecified, with bleeding: Secondary | ICD-10-CM | POA: Diagnosis not present

## 2015-10-29 DIAGNOSIS — M6281 Muscle weakness (generalized): Secondary | ICD-10-CM | POA: Diagnosis not present

## 2015-10-29 DIAGNOSIS — M25552 Pain in left hip: Secondary | ICD-10-CM | POA: Diagnosis not present

## 2015-10-29 DIAGNOSIS — R2689 Other abnormalities of gait and mobility: Secondary | ICD-10-CM | POA: Diagnosis not present

## 2015-10-31 DIAGNOSIS — M25552 Pain in left hip: Secondary | ICD-10-CM | POA: Diagnosis not present

## 2015-10-31 DIAGNOSIS — M545 Low back pain: Secondary | ICD-10-CM | POA: Diagnosis not present

## 2015-10-31 DIAGNOSIS — R29898 Other symptoms and signs involving the musculoskeletal system: Secondary | ICD-10-CM | POA: Diagnosis not present

## 2015-10-31 DIAGNOSIS — K2971 Gastritis, unspecified, with bleeding: Secondary | ICD-10-CM | POA: Diagnosis not present

## 2015-10-31 DIAGNOSIS — M6281 Muscle weakness (generalized): Secondary | ICD-10-CM | POA: Diagnosis not present

## 2015-10-31 DIAGNOSIS — R2689 Other abnormalities of gait and mobility: Secondary | ICD-10-CM | POA: Diagnosis not present

## 2015-11-13 ENCOUNTER — Encounter: Payer: Self-pay | Admitting: Nurse Practitioner

## 2015-11-13 ENCOUNTER — Non-Acute Institutional Stay: Payer: Medicare Other | Admitting: Nurse Practitioner

## 2015-11-13 DIAGNOSIS — D500 Iron deficiency anemia secondary to blood loss (chronic): Secondary | ICD-10-CM

## 2015-11-13 DIAGNOSIS — K5900 Constipation, unspecified: Secondary | ICD-10-CM

## 2015-11-13 DIAGNOSIS — N318 Other neuromuscular dysfunction of bladder: Secondary | ICD-10-CM | POA: Diagnosis not present

## 2015-11-13 DIAGNOSIS — I10 Essential (primary) hypertension: Secondary | ICD-10-CM | POA: Diagnosis not present

## 2015-11-13 DIAGNOSIS — M25552 Pain in left hip: Secondary | ICD-10-CM | POA: Diagnosis not present

## 2015-11-13 DIAGNOSIS — K219 Gastro-esophageal reflux disease without esophagitis: Secondary | ICD-10-CM

## 2015-11-13 DIAGNOSIS — F4323 Adjustment disorder with mixed anxiety and depressed mood: Secondary | ICD-10-CM | POA: Diagnosis not present

## 2015-11-13 NOTE — Assessment & Plan Note (Signed)
Stable, continue MiraLax daily. Observe.

## 2015-11-13 NOTE — Assessment & Plan Note (Signed)
Saw Urology 06/06/14-continued with Myrbetriq 25mg.  Stress incontinent in natures. 3-4x/night   

## 2015-11-13 NOTE — Progress Notes (Signed)
Location:   [facility] Room Number: [place] Service:  SNF (31) Provider:  Lotus Gover  NP   Patient Care Team: Estill Dooms, MD as PCP - General (Internal Medicine) Lavon Horn Otho Darner, NP as Nurse Practitioner (Nurse Practitioner)  Extended Emergency Contact Information Primary Emergency Contact: [name] Address: [place], [place] [address] [name] of [place] Phone: [phone] Work Phone: [phone] Mobile Phone: [phone] Relation: Daughter Secondary Emergency Contact: [name] Address: [address] [place] [address] [name] of [place] Phone: [phone] Mobile Phone: [phone] Relation: Son  Code Status:  DNR Goals of care: Advanced Directive information Advanced Directives 11/13/2015  Does patient have an advance directive? Yes  Type of Paramedic of [place];Out of facility DNR (pink MOST or yellow form)  Does patient want to make changes to advanced directive? No - Patient declined  Copy of advanced directive(s) in chart? Yes  Pre-existing out of facility DNR order (yellow form or pink MOST form) -     Chief Complaint  Patient presents with  . Medical Management of Chronic Issues    HPI:  Pt is a 82 y.o. female seen today for medical management of chronic diseases.      Hx of Left hip pain, ambulates with walker, resolved after spinal inj, takes Naproxen 220mg  tid.  Blood pressure is controlled Amlodipine 5mg , Losartan 100mg . Urinary frequent, managed with Myrbetriq 25mg , mood is stable Cymbalta 20mg . Taking Iron for anemia.   Past Medical History:  Diagnosis Date  . Abnormality of gait 04/28/2010   History of fall onto right shoulder-has participated in PT. Uses walker.     . Acute encephalopathy 10/25/14   Occurred when septic  . Acute lower GI bleeding 10/30/2014  . Anemia   . Aortic atherosclerosis ([place]) 10/27/2014    . Asthma   . Cerebral atrophy 10/27/2014  . Cerebrovascular disease 10/27/2014   Small vessel disease  . Cervical disc disease   . Degenerative disc disease, lumbar 10/27/2014  . Depression   . Diarrhea 10/25/14  . Diverticulosis 10/27/2014  . Fall at nursing home 10/25/2014  . GERD (gastroesophageal reflux disease)   . History of cardiovascular disorder 09/20/2006   X 3 in 2000 now on aspirin.    Marland Kitchen Hyperlipidemia 09/20/2006   05/15/14 LDL 95   . Hypertension   . Lumbosacral spondylosis 10/28/2009  . OSTEOPOROSIS 09/20/2006   Noted 2008    . Overactive bladder 11/29/2013   Myrbetriq prescribed by urology.    . Protein calorie malnutrition ([place])    06/06/15 TP 6.0 albumin 2.9  . SCOLIOSIS 10/28/2009  . Scoliosis 10/27/2014  . TIA (transient ischemic attack) 09/20/2006   Past Surgical History:  Procedure Laterality Date  . ABDOMINAL HYSTERECTOMY    . APPENDECTOMY    . CHOLECYSTECTOMY    . TONSILLECTOMY      Allergies  Allergen Reactions  . Tramadol Other (See Comments)    Hallucinations, agitation/combativeness  . Amoxicillin     Per MAR  . Oxycodone Other (See Comments)    Per MAR   . Sulfamethoxazole     Per MAR   . Penicillins Rash      Medication List       Accurate as of 11/13/15  4:11 PM. Always use your most recent med list.  acetaminophen 325 MG tablet Commonly known as:  TYLENOL Take 650 mg by mouth every 4 (four) hours as needed for moderate pain or headache.   albuterol 108 (90 Base) MCG/ACT inhaler Commonly known as:  PROVENTIL HFA;VENTOLIN HFA Inhale 2 puffs into the lungs every 6 (six) hours as needed for wheezing or shortness of breath.   alum & mag hydroxide-simeth 200-200-20 MG/5ML suspension Commonly known as:  MAALOX/MYLANTA Take 30 mLs by mouth as needed for indigestion or heartburn.   amLODipine 5 MG tablet Commonly known as:  NORVASC Take 5 mg by mouth daily with breakfast.   cholestyramine 4 g packet Commonly known as:   QUESTRAN Take 4 g by mouth 2 (two) times daily.   DULoxetine 20 MG capsule Commonly known as:  CYMBALTA Take 20 mg by mouth daily.   ferrous sulfate 325 (65 FE) MG tablet Take 325 mg by mouth daily.   loperamide 2 MG capsule Commonly known as:  IMODIUM Take one to two tablets by mouth as needed for diarrhea, Max 16 mg/24 hours call MD if Diarrhea continues   losartan 100 MG tablet Commonly known as:  COZAAR Take 100 mg by mouth daily with breakfast.   MYRBETRIQ 25 MG Tb24 tablet Generic drug:  mirabegron ER Take 25 mg by mouth daily with breakfast. Prescribed by urology   naproxen sodium 220 MG tablet Commonly known as:  ANAPROX Take 220 mg by mouth 2 (two) times daily with a meal.   nitroGLYCERIN 0.4 MG SL tablet Commonly known as:  NITROSTAT Place 0.4 mg under the tongue every 5 (five) minutes as needed for chest pain. After taking three times and no relief call MD.   omeprazole 20 MG capsule Commonly known as:  PRILOSEC Take 20 mg by mouth daily.   polyethylene glycol packet Commonly known as:  MIRALAX / GLYCOLAX Take 17 g by mouth daily.   pyridOXINE 100 MG tablet Commonly known as:  VITAMIN B-6 Take 100 mg by mouth daily with breakfast.       Review of Systems  Constitutional: Negative for chills, diaphoresis and fever.  HENT: Positive for hearing loss. Negative for congestion, ear discharge, ear pain, nosebleeds, sore throat and tinnitus.   Eyes: Negative for photophobia, pain, discharge and redness.  Respiratory: Positive for cough. Negative for shortness of breath, wheezing and stridor.        Chronic mild hacking cough, scant clear sputum  Cardiovascular: Positive for leg swelling. Negative for chest pain and palpitations.       Trace edema BLE  Gastrointestinal: Negative for abdominal pain, blood in stool, constipation, diarrhea (Under control with Questran and Imodium), nausea and vomiting.  Endocrine: Negative for polydipsia.  Genitourinary: Positive  for frequency. Negative for dysuria, flank pain, hematuria and urgency.       1-2x/night. Stress incontinence  Musculoskeletal: Positive for arthralgias, back pain and gait problem. Negative for myalgias and neck pain.       Left lower back, left hip, left thigh pain with weight bearing and position changes Chest pain was relieved after Mylanta 05/28/15 Neck pain 05/29/15 Right upper back pin 05/30/15 11/13/15 denied pain or aches.   Skin: Negative.  Negative for rash.  Allergic/Immunologic: Negative for environmental allergies.  Neurological: Negative for dizziness, tremors, seizures, weakness and headaches.       Memory loss. Patient had delirium when septic.  Hematological: Does not bruise/bleed easily.       Anemic secondary to acute blood loss from lower GI bleeding  Psychiatric/Behavioral: Negative for agitation, confusion, hallucinations and suicidal ideas. The patient is nervous/anxious.     Immunization History  Administered Date(s) Administered  . HiB (PRP-OMP) 02/12/2004  . Influenza Split 01/11/2012  . Influenza Whole 01/07/2009  . Influenza-Unspecified 01/11/2014, 01/01/2015  . PPD Test 11/14/2014, 06/03/2015, 06/17/2015  . Pneumococcal Polysaccharide-23 04/28/2010  . Tdap 08/24/2011  . Zoster 04/13/2010   Pertinent  Health Maintenance Due  Topic Date Due  . DEXA SCAN  01/28/1991  . PNA vac Low Risk Adult (2 of 2 - PCV13) 04/29/2011  . INFLUENZA VACCINE  11/12/2015   Fall Risk  06/20/2015 05/23/2015 11/09/2014 05/10/2014  Falls in the past year? No No No No   Functional Status Survey:    Vitals:   11/13/15 1139  BP: 122/70  Pulse: 82  Resp: 18  Temp: 98.7 F (37.1 C)  Weight: 144 lb 3.2 oz (65.4 kg)  Height: 5\' 6"  (1.676 m)   Body mass index is 23.27 kg/m. Physical Exam  Constitutional: She is oriented to person, place, and time. She appears well-developed and well-nourished. No distress.  HENT:  Head: Normocephalic and atraumatic.  Right Ear: No lacerations.  No drainage. No foreign bodies. No mastoid tenderness. Tympanic membrane is not injected, not scarred, not perforated, not erythematous, not retracted and not bulging. Tympanic membrane mobility is abnormal. No middle ear effusion. Decreased hearing is noted.  Left Ear: No lacerations. No drainage. No foreign bodies. No mastoid tenderness. Tympanic membrane is not injected, not scarred, not perforated, not erythematous, not retracted and not bulging. Tympanic membrane mobility is abnormal.  No middle ear effusion. Decreased hearing is noted.  Nose: Nose normal.  Mouth/Throat: Oropharynx is clear and moist. No oropharyngeal exudate.  Eyes: Conjunctivae and EOM are normal. Pupils are equal, round, and reactive to light. Right eye exhibits no discharge. Left eye exhibits no discharge. No scleral icterus.  Neck: Normal range of motion. Neck supple. No JVD present. No tracheal deviation present. No thyromegaly present.  Cardiovascular: Normal rate, regular rhythm, normal heart sounds and intact distal pulses.  Exam reveals no gallop and no friction rub.   No murmur heard. Pulmonary/Chest: Effort normal. No stridor. No respiratory distress. She has no wheezes. She has no rales. She exhibits no tenderness.  Coarse rales posterior mid to lower lungs.  Abdominal: Soft. Bowel sounds are normal. She exhibits no distension and no mass. There is no tenderness. There is no rebound and no guarding.  Genitourinary: Rectal exam shows guaiac negative stool.  Musculoskeletal: She exhibits edema and tenderness.  Left lower back, left hip, left thigh pain with weight bearing and position changes Chest pain was relieved after Mylanta 05/28/15 Neck pain 05/29/15 Right upper back pin 05/30/15 06/04/15 mid to lower back pain, more on the right side BLE trace edema 11/13/15 no c/o pain today  Lymphadenopathy:    She has no cervical adenopathy.  Neurological: She is alert and oriented to person, place, and time. She has normal  reflexes. No cranial nerve deficit. She exhibits normal muscle tone. Coordination normal.  Skin: Skin is warm and dry. No rash noted. She is not diaphoretic. No erythema. No pallor.  Lots of moles.   Psychiatric: Her behavior is normal. Thought content normal.    Labs reviewed:  Recent Labs  06/01/15 0552 06/02/15 0520 06/03/15 0444 06/06/15 06/10/15  NA 139 135 135  --  139  K 3.5 3.8 3.9  --  4.5  CL 104 100* 102  --   --  CO2 26 27 25   --   --   GLUCOSE 109* 118* 117*  --   --   BUN 9 16 14 19 10   CREATININE 0.50 0.67 0.60 0.8 0.6  CALCIUM 9.8 9.5 9.6  --   --     Recent Labs  05/14/15 05/30/15 1248 06/06/15  AST 16 24 16   ALT 15 28 19   ALKPHOS 59 69 71  BILITOT  --  1.1  --   PROT  --  7.4  --   ALBUMIN  --  3.6  --     Recent Labs  05/30/15 1237  06/01/15 0552 06/02/15 0520 06/03/15 0444 06/06/15 06/10/15 07/23/15  WBC 15.3*  < > 10.3 10.7* 9.2 8.4 7.3 6.1  NEUTROABS 12.4*  --   --   --   --   --   --   --   HGB 12.9  < > 12.9 12.3 12.1 12.4 13.1 14.1  HCT 38.3  < > 38.7 36.6 36.4 37 39 42  MCV 93.6  < > 94.2 92.4 94.3  --   --   --   PLT 415*  < > 456* 486* 535* 565* 602* 335  < > = values in this interval not displayed. Lab Results  Component Value Date   TSH 2.170 10/29/2014   Lab Results  Component Value Date   HGBA1C 5.9 08/24/2011   Lab Results  Component Value Date   CHOL 176 05/15/2014   HDL 62 05/15/2014   LDLCALC 95 05/15/2014   LDLDIRECT 114.3 12/08/2007   TRIG 93 05/15/2014   CHOLHDL 2.9 CALC 12/08/2007    Significant Diagnostic Results in last 30 days:  No results found.  Assessment/Plan There are no diagnoses linked to this encounter.Essential hypertension Controlled, continue Amlodipine 5mg , Losartan 100mg , update CBC, CMP, TSH, Hgb a1c   GERD (gastroesophageal reflux disease) Complained chest pain 05/29/15, relieved after Mylanta 30ccx1, continue Omeprazole 20mg    Constipation Stable, continue MiraLax daily. Observe.      Hypertonicity of bladder Saw Urology 06/06/14-continued with Myrbetriq 25mg .  Stress incontinent in natures. 3-4x/night  Anemia 07/23/15 Hgb 14.1, repeat CBC, may dc Fe  Left hip pain Well controlled, s/p spinal inj, continue Naproxen,   Adjustment disorder with mixed anxiety and depressed mood In setting of migratory aches and pains, stabilized, continue Cymbalta 20mg  daily, observe the patient.       Family/ staff Communication: continue AL for care assistance  Labs/tests ordered:  CBC, CMP, TSH, Hgb A1c

## 2015-11-13 NOTE — Assessment & Plan Note (Signed)
07/23/15 Hgb 14.1, repeat CBC, may dc Fe

## 2015-11-13 NOTE — Assessment & Plan Note (Signed)
Complained chest pain 05/29/15, relieved after Mylanta 30ccx1, continue Omeprazole 20mg 

## 2015-11-13 NOTE — Assessment & Plan Note (Signed)
Controlled, continue Amlodipine 5mg , Losartan 100mg , update CBC, CMP, TSH, Hgb a1c

## 2015-11-13 NOTE — Assessment & Plan Note (Signed)
Well controlled, s/p spinal inj, continue Naproxen,

## 2015-11-13 NOTE — Assessment & Plan Note (Signed)
In setting of migratory aches and pains, stabilized, continue Cymbalta 20mg daily, observe the patient.  

## 2015-11-14 DIAGNOSIS — I10 Essential (primary) hypertension: Secondary | ICD-10-CM | POA: Diagnosis not present

## 2015-11-14 DIAGNOSIS — D649 Anemia, unspecified: Secondary | ICD-10-CM | POA: Diagnosis not present

## 2015-11-14 DIAGNOSIS — E162 Hypoglycemia, unspecified: Secondary | ICD-10-CM | POA: Diagnosis not present

## 2015-11-14 DIAGNOSIS — Z5181 Encounter for therapeutic drug level monitoring: Secondary | ICD-10-CM | POA: Diagnosis not present

## 2015-11-14 LAB — HEPATIC FUNCTION PANEL
ALT: 15 U/L (ref 7–35)
AST: 17 U/L (ref 13–35)
Alkaline Phosphatase: 67 U/L (ref 25–125)
Bilirubin, Total: 0.6 mg/dL

## 2015-11-14 LAB — BASIC METABOLIC PANEL
BUN: 14 mg/dL (ref 4–21)
Creatinine: 0.7 mg/dL (ref <=1.1)
Glucose: 84 mg/dL
POTASSIUM: 4.2 mmol/L (ref 3.4–5.3)
SODIUM: 139 mmol/L (ref 137–147)

## 2015-11-14 LAB — CBC AND DIFFERENTIAL
HEMATOCRIT: 38 % (ref 36–46)
HEMOGLOBIN: 12.8 g/dL (ref 12.0–16.0)
Platelets: 317 10*3/uL (ref 150–399)
WBC: 6 10*3/mL

## 2015-11-14 LAB — HEMOGLOBIN A1C: Hemoglobin A1C: 4.4

## 2015-11-14 LAB — TSH: TSH: 1.54 u[IU]/mL (ref <=5.90)

## 2015-11-20 ENCOUNTER — Other Ambulatory Visit: Payer: Self-pay | Admitting: *Deleted

## 2015-12-19 ENCOUNTER — Non-Acute Institutional Stay: Payer: Medicare Other | Admitting: Internal Medicine

## 2015-12-19 ENCOUNTER — Encounter: Payer: Self-pay | Admitting: Internal Medicine

## 2015-12-19 DIAGNOSIS — D473 Essential (hemorrhagic) thrombocythemia: Secondary | ICD-10-CM

## 2015-12-19 DIAGNOSIS — M545 Low back pain: Secondary | ICD-10-CM

## 2015-12-19 DIAGNOSIS — I10 Essential (primary) hypertension: Secondary | ICD-10-CM | POA: Diagnosis not present

## 2015-12-19 DIAGNOSIS — M25552 Pain in left hip: Secondary | ICD-10-CM

## 2015-12-19 DIAGNOSIS — G8929 Other chronic pain: Secondary | ICD-10-CM

## 2015-12-19 DIAGNOSIS — D75839 Thrombocytosis, unspecified: Secondary | ICD-10-CM

## 2015-12-19 NOTE — Progress Notes (Signed)
Progress Note    Location:   [facility] Room Number: [phone] Place of Service:  ALF [phone]) Provider:  Jeanmarie Hubert, MD  Patient Care Team: Estill Dooms, MD as PCP - General (Internal Medicine) Man Otho Darner, NP as Nurse Practitioner (Nurse Practitioner)  Extended Emergency Contact Information Primary Emergency Contact: [name] Address: [place], [place] [address] [name] of [place] Phone: [phone] Work Phone: [phone] Mobile Phone: [phone] Relation: Daughter Secondary Emergency Contact: [name] Address: [address] [place] [address] [name] of [place] Phone: [phone] Mobile Phone: [phone] Relation: Son  Code Status:  DNR Goals of care: Advanced Directive information Advanced Directives 12/19/2015  Does patient have an advance directive? Yes  Type of Paramedic of [place];Living will;Out of facility DNR (pink MOST or yellow form)  Does patient want to make changes to advanced directive? -  Copy of advanced directive(s) in chart? Yes  Pre-existing out of facility DNR order (yellow form or pink MOST form) -     Chief Complaint  Patient presents with  . Medical Management of Chronic Issues    routine    HPI:  Pt is a 82 y.o. female seen today for medical management of chronic diseases: HTN, Chronic LBP, thrombocytosis.  Patient received 10 injection in the back through Dr. Alvester Morin office. She says the previous problems of discomfort in the back and down the leg have completely resolved.  Last blood count showed her platelets in the normal range.  Debility has substantially improved over the last several months.  Blood pressure is under good control.   Past Medical History:  Diagnosis Date  . Abnormality of gait 04/28/2010   History of fall onto right shoulder-has participated in PT. Uses walker.     . Acute  encephalopathy 10/25/14   Occurred when septic  . Acute lower GI bleeding 10/30/2014  . Anemia   . Aortic atherosclerosis ([place]) 10/27/2014  . Asthma   . Cerebral atrophy 10/27/2014  . Cerebrovascular disease 10/27/2014   Small vessel disease  . Cervical disc disease   . Degenerative disc disease, lumbar 10/27/2014  . Depression   . Diarrhea 10/25/14  . Diverticulosis 10/27/2014  . Fall at nursing home 10/25/2014  . GERD (gastroesophageal reflux disease)   . History of cardiovascular disorder 09/20/2006   X 3 in 2000 now on aspirin.    Marland Kitchen Hyperlipidemia 09/20/2006   05/15/14 LDL 95   . Hypertension   . Lumbosacral spondylosis 10/28/2009  . OSTEOPOROSIS 09/20/2006   Noted 2008    . Overactive bladder 11/29/2013   Myrbetriq prescribed by urology.    . Protein calorie malnutrition ([place])    06/06/15 TP 6.0 albumin 2.9  . SCOLIOSIS 10/28/2009  . Scoliosis 10/27/2014  . TIA (transient ischemic attack) 09/20/2006   Past Surgical History:  Procedure Laterality Date  . ABDOMINAL HYSTERECTOMY    . APPENDECTOMY    . CHOLECYSTECTOMY    . TONSILLECTOMY      Allergies  Allergen Reactions  . Tramadol Other (See Comments)    Hallucinations, agitation/combativeness  . Amoxicillin     Per MAR  . Oxycodone Other (See Comments)    Per MAR   . Sulfamethoxazole     Per MAR   . Penicillins Rash      Medication List  Accurate as of 12/19/15  2:15 PM. Always use your most recent med list.          acetaminophen 325 MG tablet Commonly known as:  TYLENOL Take 650 mg by mouth every 4 (four) hours as needed for moderate pain or headache.   albuterol 108 (90 Base) MCG/ACT inhaler Commonly known as:  PROVENTIL HFA;VENTOLIN HFA Inhale 2 puffs into the lungs every 6 (six) hours as needed for wheezing or shortness of breath.   alum & mag hydroxide-simeth 200-200-20 MG/5ML suspension Commonly known as:  MAALOX/MYLANTA Take 30 mLs by mouth as needed for indigestion or heartburn.   amLODipine 5  MG tablet Commonly known as:  NORVASC Take 5 mg by mouth daily with breakfast.   cholestyramine 4 g packet Commonly known as:  QUESTRAN Take 4 g by mouth 2 (two) times daily.   cyclobenzaprine 5 MG tablet Commonly known as:  FLEXERIL Take 5 mg by mouth. Take one tablet every day as needed for muscle pain   DULoxetine 20 MG capsule Commonly known as:  CYMBALTA Take 20 mg by mouth daily.   ferrous sulfate 325 (65 FE) MG tablet Take 325 mg by mouth daily.   loperamide 2 MG capsule Commonly known as:  IMODIUM Take one to two tablets by mouth as needed for diarrhea, Max 16 mg/24 hours call MD if Diarrhea continues   losartan 100 MG tablet Commonly known as:  COZAAR Take 100 mg by mouth daily with breakfast.   MYRBETRIQ 25 MG Tb24 tablet Generic drug:  mirabegron ER Take 25 mg by mouth daily with breakfast. Prescribed by urology   naproxen sodium 220 MG tablet Commonly known as:  ANAPROX Take 220 mg by mouth 2 (two) times daily with a meal.   nitroGLYCERIN 0.4 MG SL tablet Commonly known as:  NITROSTAT Place 0.4 mg under the tongue every 5 (five) minutes as needed for chest pain. After taking three times and no relief call MD.   omeprazole 20 MG capsule Commonly known as:  PRILOSEC Take 20 mg by mouth daily.   polyethylene glycol packet Commonly known as:  MIRALAX / GLYCOLAX Take 17 g by mouth daily.   pyridOXINE 100 MG tablet Commonly known as:  VITAMIN B-6 Take 100 mg by mouth daily with breakfast.       Review of Systems  Constitutional: Negative for chills, diaphoresis and fever.  HENT: Positive for hearing loss. Negative for congestion, ear discharge, ear pain, nosebleeds, sore throat and tinnitus.   Eyes: Negative for photophobia, pain, discharge and redness.  Respiratory: Negative for cough, shortness of breath, wheezing and stridor.   Cardiovascular: Positive for leg swelling. Negative for chest pain and palpitations.       Trace edema BLE    Gastrointestinal: Negative for abdominal pain, blood in stool, constipation, diarrhea (Under control with Questran and Imodium), nausea and vomiting.  Endocrine: Negative for polydipsia.  Genitourinary: Positive for frequency. Negative for dysuria, flank pain, hematuria and urgency.       1-2x/night. Stress incontinence  Musculoskeletal: Positive for arthralgias, back pain and gait problem. Negative for myalgias and neck pain.       Resolution of Left lower back, left hip, left thigh pain with weight bearing and position changes Chest pain was relieved after Mylanta 05/28/15 Neck pain 05/29/15  Skin: Negative.  Negative for rash.  Allergic/Immunologic: Negative for environmental allergies.  Neurological: Negative for dizziness, tremors, seizures, weakness and headaches.       Memory loss. Patient had  delirium when septic.  Hematological: Does not bruise/bleed easily.       Resolved anemia secondary to acute blood loss from lower GI bleeding. Plt count has returned to normal.  Psychiatric/Behavioral: Negative for agitation, confusion, hallucinations and suicidal ideas. The patient is nervous/anxious.     Immunization History  Administered Date(s) Administered  . HiB (PRP-OMP) 02/12/2004  . Influenza Split 01/11/2012  . Influenza Whole 01/07/2009  . Influenza-Unspecified 01/11/2014, 01/01/2015  . PPD Test 11/14/2014, 06/03/2015, 06/17/2015  . Pneumococcal Polysaccharide-23 04/28/2010  . Tdap 08/24/2011  . Zoster 04/13/2010   Pertinent  Health Maintenance Due  Topic Date Due  . DEXA SCAN  01/28/1991  . PNA vac Low Risk Adult (2 of 2 - PCV13) 04/29/2011  . INFLUENZA VACCINE  11/12/2015   Fall Risk  06/20/2015 05/23/2015 11/09/2014 05/10/2014  Falls in the past year? No No No No     Vitals:   12/19/15 1406  BP: 138/76  Pulse: 78  Resp: 18  Temp: 97.3 F (36.3 C)  Weight: 144 lb (65.3 kg)  Height: 5\' 6"  (1.676 m)   Body mass index is 23.24 kg/m. Physical Exam  Constitutional:  She is oriented to person, place, and time. She appears well-developed and well-nourished. No distress.  HENT:  Head: Normocephalic and atraumatic.  Right Ear: No lacerations. No drainage. No foreign bodies. No mastoid tenderness. Tympanic membrane is not injected, not scarred, not perforated, not erythematous, not retracted and not bulging. Tympanic membrane mobility is abnormal. No middle ear effusion. Decreased hearing is noted.  Left Ear: No lacerations. No drainage. No foreign bodies. No mastoid tenderness. Tympanic membrane is not injected, not scarred, not perforated, not erythematous, not retracted and not bulging. Tympanic membrane mobility is abnormal.  No middle ear effusion. Decreased hearing is noted.  Nose: Nose normal.  Mouth/Throat: Oropharynx is clear and moist. No oropharyngeal exudate.  Eyes: Conjunctivae and EOM are normal. Pupils are equal, round, and reactive to light. Right eye exhibits no discharge. Left eye exhibits no discharge. No scleral icterus.  Neck: Normal range of motion. Neck supple. No JVD present. No tracheal deviation present. No thyromegaly present.  Cardiovascular: Normal rate, regular rhythm, normal heart sounds and intact distal pulses.  Exam reveals no gallop and no friction rub.   No murmur heard. Pulmonary/Chest: Effort normal. No stridor. No respiratory distress. She has no wheezes. She has no rales. She exhibits no tenderness.  Coarse rales posterior mid to lower lungs.  Abdominal: Soft. Bowel sounds are normal. She exhibits no distension and no mass. There is no tenderness. There is no rebound and no guarding.  Genitourinary: Rectal exam shows guaiac negative stool.  Musculoskeletal: She exhibits edema and tenderness.  Left lower back, left hip, left thigh pain with weight bearing and position changes Chest pain was relieved after Mylanta 05/28/15 Neck pain 05/29/15 BLE trace edema  Lymphadenopathy:    She has no cervical adenopathy.  Neurological:  She is alert and oriented to person, place, and time. She has normal reflexes. No cranial nerve deficit. She exhibits normal muscle tone. Coordination normal.  Skin: Skin is warm and dry. No rash noted. She is not diaphoretic. No erythema. No pallor.  Lots of moles.   Psychiatric: Her behavior is normal. Thought content normal.    Labs reviewed:  Recent Labs  06/01/15 0552 06/02/15 0520 06/03/15 0444 06/06/15 06/10/15 11/14/15  NA 139 135 135  --  139 139  K 3.5 3.8 3.9  --  4.5 4.2  CL  104 100* 102  --   --   --   CO2 26 27 25   --   --   --   GLUCOSE 109* 118* 117*  --   --   --   BUN 9 16 14 19 10 14   CREATININE 0.50 0.67 0.60 0.8 0.6 0.7  CALCIUM 9.8 9.5 9.6  --   --   --     Recent Labs  05/30/15 1248 06/06/15 11/14/15  AST 24 16 17   ALT 28 19 15   ALKPHOS 69 71 67  BILITOT 1.1  --   --   PROT 7.4  --   --   ALBUMIN 3.6  --   --     Recent Labs  05/30/15 1237  06/01/15 0552 06/02/15 0520 06/03/15 0444  06/10/15 07/23/15 11/14/15  WBC 15.3*  < > 10.3 10.7* 9.2  < > 7.3 6.1 6.0  NEUTROABS 12.4*  --   --   --   --   --   --   --   --   HGB 12.9  < > 12.9 12.3 12.1  < > 13.1 14.1 12.8  HCT 38.3  < > 38.7 36.6 36.4  < > 39 42 38  MCV 93.6  < > 94.2 92.4 94.3  --   --   --   --   PLT 415*  < > 456* 486* 535*  < > 602* 335 317  < > = values in this interval not displayed. Lab Results  Component Value Date   TSH 1.54 11/14/2015   Lab Results  Component Value Date   HGBA1C 4.4 11/14/2015   Lab Results  Component Value Date   CHOL 176 05/15/2014   HDL 62 05/15/2014   LDLCALC 95 05/15/2014   LDLDIRECT 114.3 12/08/2007   TRIG 93 05/15/2014   CHOLHDL 2.9 CALC 12/08/2007    Assessment/Plan 1. Essential hypertension controlled  2. Chronic lower back pain Resolved after spine injection  3. Left hip pain resolved  4. Thrombocytosis ([place]) resolved

## 2016-01-02 DIAGNOSIS — K219 Gastro-esophageal reflux disease without esophagitis: Secondary | ICD-10-CM | POA: Diagnosis not present

## 2016-01-15 ENCOUNTER — Non-Acute Institutional Stay: Payer: Medicare Other | Admitting: Nurse Practitioner

## 2016-01-15 ENCOUNTER — Encounter: Payer: Self-pay | Admitting: Nurse Practitioner

## 2016-01-15 DIAGNOSIS — F4323 Adjustment disorder with mixed anxiety and depressed mood: Secondary | ICD-10-CM | POA: Diagnosis not present

## 2016-01-15 DIAGNOSIS — K219 Gastro-esophageal reflux disease without esophagitis: Secondary | ICD-10-CM | POA: Diagnosis not present

## 2016-01-15 DIAGNOSIS — M5440 Lumbago with sciatica, unspecified side: Secondary | ICD-10-CM

## 2016-01-15 DIAGNOSIS — D75839 Thrombocytosis, unspecified: Secondary | ICD-10-CM

## 2016-01-15 DIAGNOSIS — G8929 Other chronic pain: Secondary | ICD-10-CM

## 2016-01-15 DIAGNOSIS — I10 Essential (primary) hypertension: Secondary | ICD-10-CM

## 2016-01-15 DIAGNOSIS — D473 Essential (hemorrhagic) thrombocythemia: Secondary | ICD-10-CM

## 2016-01-15 DIAGNOSIS — N318 Other neuromuscular dysfunction of bladder: Secondary | ICD-10-CM

## 2016-01-15 NOTE — Assessment & Plan Note (Signed)
11/15/15 Plt 317

## 2016-01-15 NOTE — Assessment & Plan Note (Signed)
Saw Urology 06/06/14-continued with Myrbetriq 25mg.  Stress incontinent in natures. 3-4x/night   

## 2016-01-15 NOTE — Progress Notes (Signed)
Location:  [place] Room Number: [place] of Service:  ALF [phone]) Provider:  Leandra Vanderweele, Manxie  NP  Jeanmarie Hubert, MD  Patient Care Team: Estill Dooms, MD as PCP - General (Internal Medicine) Simrin Vegh Otho Darner, NP as Nurse Practitioner (Nurse Practitioner)  Extended Emergency Contact Information Primary Emergency Contact: [name] Address: [place], [place] [address] [name] of [place] Phone: [phone] Work Phone: [phone] Mobile Phone: [phone] Relation: Daughter Secondary Emergency Contact: [name] Address: [address] [place] [address] [name] of [place] Phone: [phone] Mobile Phone: [phone] Relation: Son Code Status:  DNR Goals of care: Advanced Directive information   Advanced Directives 12/19/2015  Does patient have an advance directive? Yes  Type of Paramedic of [place];Living will;Out of facility DNR (pink MOST or yellow form)  Does patient want to make changes to advanced directive? -  Copy of advanced directive(s) in chart? Yes  Pre-existing out of facility DNR order (yellow form or pink MOST form) -     Chief Complaint  Patient presents with  . Medical Management of Chronic Issues    HPI:  Pt is a 82 y.o. female seen today for medical management of chronic diseases.    Patient received 10 injection in the back through Dr. Alvester Morin office. She says the previous problems of discomfort in the back and down the leg have completely resolved.  She takes Naproxen 220mg  tid for back and hip pain.  Blood pressure is controlled Amlodipine 5mg , Losartan 100mg . Urinary frequent, managed with Myrbetriq 25mg , mood is stable Cymbalta 20mg . Taking Iron for anemia.      Past Medical History:  Diagnosis Date  . Abnormality of gait 04/28/2010   History of fall onto right shoulder-has participated in PT. Uses walker.     .  Acute encephalopathy 10/25/14   Occurred when septic  . Acute lower GI bleeding 10/30/2014  . Anemia   . Aortic atherosclerosis ([place]) 10/27/2014  . Asthma   . Cerebral atrophy 10/27/2014  . Cerebrovascular disease 10/27/2014   Small vessel disease  . Cervical disc disease   . Degenerative disc disease, lumbar 10/27/2014  . Depression   . Diarrhea 10/25/14  . Diverticulosis 10/27/2014  . Fall at nursing home 10/25/2014  . GERD (gastroesophageal reflux disease)   . History of cardiovascular disorder 09/20/2006   X 3 in 2000 now on aspirin.    Marland Kitchen Hyperlipidemia 09/20/2006   05/15/14 LDL 95   . Hypertension   . Lumbosacral spondylosis 10/28/2009  . OSTEOPOROSIS 09/20/2006   Noted 2008    . Overactive bladder 11/29/2013   Myrbetriq prescribed by urology.    . Protein calorie malnutrition ([place])    06/06/15 TP 6.0 albumin 2.9  . SCOLIOSIS 10/28/2009  . Scoliosis 10/27/2014  . TIA (transient ischemic attack) 09/20/2006   Past Surgical History:  Procedure Laterality Date  . ABDOMINAL HYSTERECTOMY    . APPENDECTOMY    . CHOLECYSTECTOMY    . TONSILLECTOMY      Allergies  Allergen Reactions  . Tramadol Other (See Comments)    Hallucinations, agitation/combativeness  . Amoxicillin     Per MAR  . Oxycodone Other (See Comments)    Per MAR   . Sulfamethoxazole     Per MAR   . Penicillins Rash      Medication List  Accurate as of 01/15/16  4:35 PM. Always use your most recent med list.          acetaminophen 325 MG tablet Commonly known as:  TYLENOL Take 650 mg by mouth every 4 (four) hours as needed for moderate pain or headache.   albuterol 108 (90 Base) MCG/ACT inhaler Commonly known as:  PROVENTIL HFA;VENTOLIN HFA Inhale 2 puffs into the lungs every 6 (six) hours as needed for wheezing or shortness of breath.   alum & mag hydroxide-simeth 200-200-20 MG/5ML suspension Commonly known as:  MAALOX/MYLANTA Take 30 mLs by mouth as needed for indigestion or heartburn.     amLODipine 5 MG tablet Commonly known as:  NORVASC Take 5 mg by mouth daily with breakfast.   cholestyramine 4 g packet Commonly known as:  QUESTRAN Take 4 g by mouth 2 (two) times daily.   cyclobenzaprine 5 MG tablet Commonly known as:  FLEXERIL Take 5 mg by mouth. Take one tablet every day as needed for muscle pain   DULoxetine 20 MG capsule Commonly known as:  CYMBALTA Take 20 mg by mouth daily.   ferrous sulfate 325 (65 FE) MG tablet Take 325 mg by mouth daily.   loperamide 2 MG capsule Commonly known as:  IMODIUM Take one to two tablets by mouth as needed for diarrhea, Max 16 mg/24 hours call MD if Diarrhea continues   losartan 100 MG tablet Commonly known as:  COZAAR Take 100 mg by mouth daily with breakfast.   MYRBETRIQ 25 MG Tb24 tablet Generic drug:  mirabegron ER Take 25 mg by mouth daily with breakfast. Prescribed by urology   naproxen sodium 220 MG tablet Commonly known as:  ANAPROX Take 220 mg by mouth 2 (two) times daily with a meal.   nitroGLYCERIN 0.4 MG SL tablet Commonly known as:  NITROSTAT Place 0.4 mg under the tongue every 5 (five) minutes as needed for chest pain. After taking three times and no relief call MD.   omeprazole 20 MG capsule Commonly known as:  PRILOSEC Take 20 mg by mouth daily.   polyethylene glycol packet Commonly known as:  MIRALAX / GLYCOLAX Take 17 g by mouth daily.   pyridOXINE 100 MG tablet Commonly known as:  VITAMIN B-6 Take 100 mg by mouth daily with breakfast.       Review of Systems  Constitutional: Negative for chills, diaphoresis and fever.  HENT: Positive for hearing loss. Negative for congestion, ear discharge, ear pain, nosebleeds, sore throat and tinnitus.   Eyes: Negative for photophobia, pain, discharge and redness.  Respiratory: Negative for cough, shortness of breath, wheezing and stridor.   Cardiovascular: Positive for leg swelling. Negative for chest pain and palpitations.       Trace edema BLE   Gastrointestinal: Negative for abdominal pain, blood in stool, constipation, diarrhea (Under control with Questran and Imodium), nausea and vomiting.  Endocrine: Negative for polydipsia.  Genitourinary: Positive for frequency. Negative for dysuria, flank pain, hematuria and urgency.       1-2x/night. Stress incontinence  Musculoskeletal: Positive for arthralgias and gait problem. Negative for back pain, myalgias and neck pain.       Resolution of Left lower back, left hip, left thigh pain with weight bearing and position changes Chest pain was relieved after Mylanta 05/28/15 Neck pain 05/29/15  Skin: Negative.  Negative for rash.  Allergic/Immunologic: Negative for environmental allergies.  Neurological: Negative for dizziness, tremors, seizures, weakness and headaches.       Memory loss. Patient had  delirium when septic.  Hematological: Does not bruise/bleed easily.       Resolved anemia secondary to acute blood loss from lower GI bleeding. Plt count has returned to normal.  Psychiatric/Behavioral: Negative for agitation, confusion, hallucinations and suicidal ideas. The patient is nervous/anxious.     Immunization History  Administered Date(s) Administered  . HiB (PRP-OMP) 02/12/2004  . Influenza Split 01/11/2012  . Influenza Whole 01/07/2009  . Influenza-Unspecified 01/11/2014, 01/01/2015  . PPD Test 11/14/2014, 06/03/2015, 06/17/2015  . Pneumococcal Polysaccharide-23 04/28/2010  . Tdap 08/24/2011  . Zoster 04/13/2010   Pertinent  Health Maintenance Due  Topic Date Due  . DEXA SCAN  01/28/1991  . PNA vac Low Risk Adult (2 of 2 - PCV13) 04/29/2011  . INFLUENZA VACCINE  11/12/2015   Fall Risk  06/20/2015 05/23/2015 11/09/2014 05/10/2014  Falls in the past year? No No No No   Functional Status Survey:    Vitals:   01/15/16 1217  BP: 126/72  Pulse: 77  Resp: 18  Temp: 97.9 F (36.6 C)  Weight: 146 lb 6.4 oz (66.4 kg)  Height: 5\' 6"  (1.676 m)   Body mass index is 23.63  kg/m. Physical Exam  Constitutional: She is oriented to person, place, and time. She appears well-developed and well-nourished. No distress.  HENT:  Head: Normocephalic and atraumatic.  Right Ear: No lacerations. No drainage. No foreign bodies. No mastoid tenderness. Tympanic membrane is not injected, not scarred, not perforated, not erythematous, not retracted and not bulging. Tympanic membrane mobility is abnormal. No middle ear effusion. Decreased hearing is noted.  Left Ear: No lacerations. No drainage. No foreign bodies. No mastoid tenderness. Tympanic membrane is not injected, not scarred, not perforated, not erythematous, not retracted and not bulging. Tympanic membrane mobility is abnormal.  No middle ear effusion. Decreased hearing is noted.  Nose: Nose normal.  Mouth/Throat: Oropharynx is clear and moist. No oropharyngeal exudate.  Eyes: Conjunctivae and EOM are normal. Pupils are equal, round, and reactive to light. Right eye exhibits no discharge. Left eye exhibits no discharge. No scleral icterus.  Neck: Normal range of motion. Neck supple. No JVD present. No tracheal deviation present. No thyromegaly present.  Cardiovascular: Normal rate, regular rhythm, normal heart sounds and intact distal pulses.  Exam reveals no gallop and no friction rub.   No murmur heard. Pulmonary/Chest: Effort normal. No stridor. No respiratory distress. She has no wheezes. She has no rales. She exhibits no tenderness.  Coarse rales posterior mid to lower lungs.  Abdominal: Soft. Bowel sounds are normal. She exhibits no distension and no mass. There is no tenderness. There is no rebound and no guarding.  Genitourinary: Rectal exam shows guaiac negative stool.  Musculoskeletal: She exhibits edema and tenderness.  Resolved Left lower back, left hip, left thigh pain with weight bearing and position changes Chest pain was relieved after Mylanta 05/28/15 Neck pain 05/29/15 BLE trace edema  Lymphadenopathy:     She has no cervical adenopathy.  Neurological: She is alert and oriented to person, place, and time. She has normal reflexes. No cranial nerve deficit. She exhibits normal muscle tone. Coordination normal.  Skin: Skin is warm and dry. No rash noted. She is not diaphoretic. No erythema. No pallor.  Lots of moles.   Psychiatric: Her behavior is normal. Thought content normal.    Labs reviewed:  Recent Labs  06/01/15 0552 06/02/15 0520 06/03/15 0444 06/06/15 06/10/15 11/14/15  NA 139 135 135  --  139 139  K 3.5 3.8 3.9  --  4.5 4.2  CL 104 100* 102  --   --   --   CO2 26 27 25   --   --   --   GLUCOSE 109* 118* 117*  --   --   --   BUN 9 16 14 19 10 14   CREATININE 0.50 0.67 0.60 0.8 0.6 0.7  CALCIUM 9.8 9.5 9.6  --   --   --     Recent Labs  05/30/15 1248 06/06/15 11/14/15  AST 24 16 17   ALT 28 19 15   ALKPHOS 69 71 67  BILITOT 1.1  --   --   PROT 7.4  --   --   ALBUMIN 3.6  --   --     Recent Labs  05/30/15 1237  06/01/15 0552 06/02/15 0520 06/03/15 0444  06/10/15 07/23/15 11/14/15  WBC 15.3*  < > 10.3 10.7* 9.2  < > 7.3 6.1 6.0  NEUTROABS 12.4*  --   --   --   --   --   --   --   --   HGB 12.9  < > 12.9 12.3 12.1  < > 13.1 14.1 12.8  HCT 38.3  < > 38.7 36.6 36.4  < > 39 42 38  MCV 93.6  < > 94.2 92.4 94.3  --   --   --   --   PLT 415*  < > 456* 486* 535*  < > 602* 335 317  < > = values in this interval not displayed. Lab Results  Component Value Date   TSH 1.54 11/14/2015   Lab Results  Component Value Date   HGBA1C 4.4 11/14/2015   Lab Results  Component Value Date   CHOL 176 05/15/2014   HDL 62 05/15/2014   LDLCALC 95 05/15/2014   LDLDIRECT 114.3 12/08/2007   TRIG 93 05/15/2014   CHOLHDL 2.9 CALC 12/08/2007    Significant Diagnostic Results in last 30 days:  No results found.  Assessment/Plan Essential hypertension Controlled, continue Amlodipine 5mg , Losartan 100mg   GERD (gastroesophageal reflux disease) Complained chest pain 05/29/15, relieved  after Mylanta 30ccx1, continue Omeprazole 20mg     Hypertonicity of bladder Saw Urology 06/06/14-continued with Myrbetriq 25mg .  Stress incontinent in natures. 3-4x/night   Chronic lower back pain ncluding lumbar spondylosis. Regular f/u Dr. French Ana. Receives injections last 01/2013.  05/27/15 Xray left hip/pelvis: spondylotic changes, severe osteoarthritis Continue Tylenol and Tramadol prn for pain, observe the patient, adding Cymbalta may help. Mid to lower back pain more on the right side back, may consider X-ray of thoracic spine. 06/01/15 CT thoracic spine showed no acute changes.  Patient received 10 injection in the back through Dr. Alvester Morin office. She says the previous problems of discomfort in the back and down the leg have completely resolved. Takes Aleve 220mg  bid   Adjustment disorder with mixed anxiety and depressed mood In setting of migratory aches and pains, stabilized, continue Cymbalta 20mg  daily, observe the patient.     Thrombocytosis ([place]) 11/15/15 Plt 317     Family/ staff Communication: continue AL for care assistance.   Labs/tests ordered:  none

## 2016-01-15 NOTE — Assessment & Plan Note (Signed)
ncluding lumbar spondylosis. Regular f/u Dr. French Ana. Receives injections last 01/2013.  05/27/15 Xray left hip/pelvis: spondylotic changes, severe osteoarthritis Continue Tylenol and Tramadol prn for pain, observe the patient, adding Cymbalta may help. Mid to lower back pain more on the right side back, may consider X-ray of thoracic spine. 06/01/15 CT thoracic spine showed no acute changes.  Patient received 10 injection in the back through Dr. Alvester Morin office. She says the previous problems of discomfort in the back and down the leg have completely resolved. Takes Aleve 220mg  bid

## 2016-01-15 NOTE — Assessment & Plan Note (Signed)
In setting of migratory aches and pains, stabilized, continue Cymbalta 20mg daily, observe the patient.  

## 2016-01-15 NOTE — Assessment & Plan Note (Signed)
Controlled, continue Amlodipine 5mg, Losartan 100mg 

## 2016-01-15 NOTE — Assessment & Plan Note (Signed)
Complained chest pain 05/29/15, relieved after Mylanta 30ccx1, continue Omeprazole 20mg 

## 2016-02-12 ENCOUNTER — Encounter: Payer: Self-pay | Admitting: Nurse Practitioner

## 2016-02-12 ENCOUNTER — Non-Acute Institutional Stay: Payer: Medicare Other | Admitting: Nurse Practitioner

## 2016-02-12 DIAGNOSIS — G8929 Other chronic pain: Secondary | ICD-10-CM | POA: Diagnosis not present

## 2016-02-12 DIAGNOSIS — N318 Other neuromuscular dysfunction of bladder: Secondary | ICD-10-CM

## 2016-02-12 DIAGNOSIS — M5440 Lumbago with sciatica, unspecified side: Secondary | ICD-10-CM

## 2016-02-12 DIAGNOSIS — K219 Gastro-esophageal reflux disease without esophagitis: Secondary | ICD-10-CM

## 2016-02-12 DIAGNOSIS — I10 Essential (primary) hypertension: Secondary | ICD-10-CM | POA: Diagnosis not present

## 2016-02-12 DIAGNOSIS — F4323 Adjustment disorder with mixed anxiety and depressed mood: Secondary | ICD-10-CM

## 2016-02-12 NOTE — Assessment & Plan Note (Signed)
Stable, continue prn Mylanta, continue Omeprazole 20mg 

## 2016-02-12 NOTE — Progress Notes (Signed)
Location:  [facility] Room Number: [id number] Place of Service:  ALF [phone]) Provider:  Kaliyah Gladman, Manxie  NP  Jeanmarie Hubert, MD  Patient Care Team: Estill Dooms, MD as PCP - General (Internal Medicine) Jawana Reagor Otho Darner, NP as Nurse Practitioner (Nurse Practitioner)  Extended Emergency Contact Information Primary Emergency Contact: [name] Address: [place], [place] [address] [name] of [place] Phone: [phone] Work Phone: [phone] Mobile Phone: [phone] Relation: Daughter Secondary Emergency Contact: [name] Address: [address] [place] [place] [address] [name] of [place] Phone: [phone] Mobile Phone: [phone] Relation: Son  Code Status:  DNR Goals of care: Advanced Directive information Advanced Directives 02/12/2016  Does patient have an advance directive? Yes  Type of Paramedic of [place];Living will;Out of facility DNR (pink MOST or yellow form)  Does patient want to make changes to advanced directive? No - Patient declined  Copy of advanced directive(s) in chart? Yes  Pre-existing out of facility DNR order (yellow form or pink MOST form) -     Chief Complaint  Patient presents with  . Medical Management of Chronic Issues    HPI:  Pt is a 82 y.o. female seen today for medical management of chronic diseases.    Hx of back and hip pain, She takes Naproxen 220mg  tid for back and hip pain. Patient received 10 injection in the back through Dr. Alvester Morin office. She says the previous problems of discomfort in the back and down the leg have completely resolved. Blood pressure is controlled Amlodipine 5mg , Losartan 100mg . Urinary frequent, managed with Myrbetriq 25mg , mood is stable Cymbalta 20mg . Off Iron for anemia. Last Hgb 12.8 11/14/15 Past Medical History:  Diagnosis Date  . Abnormality of gait 04/28/2010   History of fall onto right  shoulder-has participated in PT. Uses walker.     . Acute encephalopathy 10/25/14   Occurred when septic  . Acute lower GI bleeding 10/30/2014  . Anemia   . Aortic atherosclerosis ([place]) 10/27/2014  . Asthma   . Cerebral atrophy 10/27/2014  . Cerebrovascular disease 10/27/2014   Small vessel disease  . Cervical disc disease   . Degenerative disc disease, lumbar 10/27/2014  . Depression   . Diarrhea 10/25/14  . Diverticulosis 10/27/2014  . Fall at nursing home 10/25/2014  . GERD (gastroesophageal reflux disease)   . History of cardiovascular disorder 09/20/2006   X 3 in 2000 now on aspirin.    Marland Kitchen Hyperlipidemia 09/20/2006   05/15/14 LDL 95   . Hypertension   . Lumbosacral spondylosis 10/28/2009  . OSTEOPOROSIS 09/20/2006   Noted 2008    . Overactive bladder 11/29/2013   Myrbetriq prescribed by urology.    . Protein calorie malnutrition ([place])    06/06/15 TP 6.0 albumin 2.9  . SCOLIOSIS 10/28/2009  . Scoliosis 10/27/2014  . TIA (transient ischemic attack) 09/20/2006   Past Surgical History:  Procedure Laterality Date  . ABDOMINAL HYSTERECTOMY    . APPENDECTOMY    . CHOLECYSTECTOMY    . TONSILLECTOMY      Allergies  Allergen Reactions  . Tramadol Other (See Comments)    Hallucinations, agitation/combativeness  . Amoxicillin     Per MAR  . Oxycodone Other (See Comments)    Per MAR   . Sulfamethoxazole     Per MAR   . Penicillins Rash  Medication List       Accurate as of 02/12/16  1:12 PM. Always use your most recent med list.          acetaminophen 325 MG tablet Commonly known as:  TYLENOL Take 650 mg by mouth every 4 (four) hours as needed for moderate pain or headache.   albuterol 108 (90 Base) MCG/ACT inhaler Commonly known as:  PROVENTIL HFA;VENTOLIN HFA Inhale 2 puffs into the lungs every 6 (six) hours as needed for wheezing or shortness of breath.   alum & mag hydroxide-simeth 200-200-20 MG/5ML suspension Commonly known as:  MAALOX/MYLANTA Take 30 mLs by  mouth as needed for indigestion or heartburn.   amLODipine 5 MG tablet Commonly known as:  NORVASC Take 5 mg by mouth daily with breakfast.   DULoxetine 20 MG capsule Commonly known as:  CYMBALTA Take 20 mg by mouth daily.   loperamide 2 MG capsule Commonly known as:  IMODIUM Take one to two tablets by mouth as needed for diarrhea, Max 16 mg/24 hours call MD if Diarrhea continues   losartan 100 MG tablet Commonly known as:  COZAAR Take 100 mg by mouth daily with breakfast.   MYRBETRIQ 25 MG Tb24 tablet Generic drug:  mirabegron ER Take 25 mg by mouth daily with breakfast. Prescribed by urology   naproxen sodium 220 MG tablet Commonly known as:  ANAPROX Take 220 mg by mouth 2 (two) times daily with a meal.   nitroGLYCERIN 0.4 MG SL tablet Commonly known as:  NITROSTAT Place 0.4 mg under the tongue every 5 (five) minutes as needed for chest pain. After taking three times and no relief call MD.   omeprazole 20 MG capsule Commonly known as:  PRILOSEC Take 20 mg by mouth daily.   polyethylene glycol packet Commonly known as:  MIRALAX / GLYCOLAX Take 17 g by mouth daily.   pyridOXINE 100 MG tablet Commonly known as:  VITAMIN B-6 Take 100 mg by mouth daily with breakfast.       Review of Systems  Constitutional: Negative for chills, diaphoresis and fever.  HENT: Positive for hearing loss. Negative for congestion, ear discharge, ear pain, nosebleeds, sore throat and tinnitus.   Eyes: Negative for photophobia, pain, discharge and redness.  Respiratory: Negative for cough, shortness of breath, wheezing and stridor.   Cardiovascular: Positive for leg swelling. Negative for chest pain and palpitations.       Trace edema BLE  Gastrointestinal: Negative for abdominal pain, blood in stool, constipation, diarrhea (Under control with Questran and Imodium), nausea and vomiting.  Endocrine: Negative for polydipsia.  Genitourinary: Positive for frequency. Negative for dysuria, flank  pain, hematuria and urgency.       1-2x/night. Stress incontinence  Musculoskeletal: Positive for arthralgias and gait problem. Negative for back pain, myalgias and neck pain.       Resolution of Left lower back, left hip, left thigh pain with weight bearing and position changes Chest pain was relieved after Mylanta 05/28/15 Neck pain 05/29/15  Skin: Negative.  Negative for rash.  Allergic/Immunologic: Negative for environmental allergies.  Neurological: Negative for dizziness, tremors, seizures, weakness and headaches.       Memory loss. Patient had delirium when septic.  Hematological: Does not bruise/bleed easily.       Resolved anemia secondary to acute blood loss from lower GI bleeding. Plt count has returned to normal.  Psychiatric/Behavioral: Negative for agitation, confusion, hallucinations and suicidal ideas. The patient is nervous/anxious.     Immunization History  Administered  Date(s) Administered  . HiB (PRP-OMP) 02/12/2004  . Influenza Split 01/11/2012  . Influenza Whole 01/07/2009  . Influenza-Unspecified 01/11/2014, 01/01/2015  . PPD Test 11/14/2014, 06/03/2015, 06/17/2015  . Pneumococcal Polysaccharide-23 04/28/2010  . Tdap 08/24/2011  . Zoster 04/13/2010   Pertinent  Health Maintenance Due  Topic Date Due  . DEXA SCAN  01/28/1991  . PNA vac Low Risk Adult (2 of 2 - PCV13) 04/29/2011  . INFLUENZA VACCINE  11/12/2015   Fall Risk  06/20/2015 05/23/2015 11/09/2014 05/10/2014  Falls in the past year? No No No No   Functional Status Survey:    Vitals:   02/12/16 1142  BP: 138/62  Pulse: 72  Resp: 16  Temp: 98.3 F (36.8 C)  Weight: 146 lb 6.4 oz (66.4 kg)  Height: 5\' 6"  (1.676 m)   Body mass index is 23.63 kg/m. Physical Exam  Constitutional: She is oriented to person, place, and time. She appears well-developed and well-nourished. No distress.  HENT:  Head: Normocephalic and atraumatic.  Right Ear: No lacerations. No drainage. No foreign bodies. No mastoid  tenderness. Tympanic membrane is not injected, not scarred, not perforated, not erythematous, not retracted and not bulging. Tympanic membrane mobility is abnormal. No middle ear effusion. Decreased hearing is noted.  Left Ear: No lacerations. No drainage. No foreign bodies. No mastoid tenderness. Tympanic membrane is not injected, not scarred, not perforated, not erythematous, not retracted and not bulging. Tympanic membrane mobility is abnormal.  No middle ear effusion. Decreased hearing is noted.  Nose: Nose normal.  Mouth/Throat: Oropharynx is clear and moist. No oropharyngeal exudate.  Eyes: Conjunctivae and EOM are normal. Pupils are equal, round, and reactive to light. Right eye exhibits no discharge. Left eye exhibits no discharge. No scleral icterus.  Neck: Normal range of motion. Neck supple. No JVD present. No tracheal deviation present. No thyromegaly present.  Cardiovascular: Normal rate, regular rhythm, normal heart sounds and intact distal pulses.  Exam reveals no gallop and no friction rub.   No murmur heard. Pulmonary/Chest: Effort normal. No stridor. No respiratory distress. She has no wheezes. She has no rales. She exhibits no tenderness.  Coarse rales posterior mid to lower lungs.  Abdominal: Soft. Bowel sounds are normal. She exhibits no distension and no mass. There is no tenderness. There is no rebound and no guarding.  Genitourinary: Rectal exam shows guaiac negative stool.  Musculoskeletal: She exhibits edema and tenderness.  Resolved Left lower back, left hip, left thigh pain with weight bearing and position changes Chest pain was relieved after Mylanta 05/28/15 Neck pain 05/29/15 BLE trace edema  Lymphadenopathy:    She has no cervical adenopathy.  Neurological: She is alert and oriented to person, place, and time. She has normal reflexes. No cranial nerve deficit. She exhibits normal muscle tone. Coordination normal.  Skin: Skin is warm and dry. No rash noted. She is not  diaphoretic. No erythema. No pallor.  Lots of moles.   Psychiatric: Her behavior is normal. Thought content normal.    Labs reviewed:  Recent Labs  06/01/15 0552 06/02/15 0520 06/03/15 0444 06/06/15 06/10/15 11/14/15  NA 139 135 135  --  139 139  K 3.5 3.8 3.9  --  4.5 4.2  CL 104 100* 102  --   --   --   CO2 26 27 25   --   --   --   GLUCOSE 109* 118* 117*  --   --   --   BUN 9 16 14 19  10  14  CREATININE 0.50 0.67 0.60 0.8 0.6 0.7  CALCIUM 9.8 9.5 9.6  --   --   --     Recent Labs  05/30/15 1248 06/06/15 11/14/15  AST 24 16 17   ALT 28 19 15   ALKPHOS 69 71 67  BILITOT 1.1  --   --   PROT 7.4  --   --   ALBUMIN 3.6  --   --     Recent Labs  05/30/15 1237  06/01/15 0552 06/02/15 0520 06/03/15 0444  06/10/15 07/23/15 11/14/15  WBC 15.3*  < > 10.3 10.7* 9.2  < > 7.3 6.1 6.0  NEUTROABS 12.4*  --   --   --   --   --   --   --   --   HGB 12.9  < > 12.9 12.3 12.1  < > 13.1 14.1 12.8  HCT 38.3  < > 38.7 36.6 36.4  < > 39 42 38  MCV 93.6  < > 94.2 92.4 94.3  --   --   --   --   PLT 415*  < > 456* 486* 535*  < > 602* 335 317  < > = values in this interval not displayed. Lab Results  Component Value Date   TSH 1.54 11/14/2015   Lab Results  Component Value Date   HGBA1C 4.4 11/14/2015   Lab Results  Component Value Date   CHOL 176 05/15/2014   HDL 62 05/15/2014   LDLCALC 95 05/15/2014   LDLDIRECT 114.3 12/08/2007   TRIG 93 05/15/2014   CHOLHDL 2.9 CALC 12/08/2007    Significant Diagnostic Results in last 30 days:  No results found.  Assessment/Plan Essential hypertension Controlled, continue Amlodipine 5mg , Losartan 100mg   GERD (gastroesophageal reflux disease) Stable, continue prn Mylanta, continue Omeprazole 20mg    Hypertonicity of bladder Saw Urology 06/06/14-continued with Myrbetriq 25mg .  Stress incontinent in natures. 3-4x/night    Chronic lower back pain lumbar spondylosis. Regular f/u Dr. French Ana. Receives injections last 01/2013.  05/27/15  Xray left hip/pelvis: spondylotic changes, severe osteoarthritis Continue Tylenol and Tramadol prn for pain, observe the patient, adding Cymbalta may help. Mid to lower back pain more on the right side back, may consider X-ray of thoracic spine. 06/01/15 CT thoracic spine showed no acute changes.  Patient received 10 injection in the back through Dr. Alvester Morin office. She says the previous problems of discomfort in the back and down the leg have completely resolved. Takes Aleve 220mg  bid  Adjustment disorder with mixed anxiety and depressed mood In setting of migratory aches and pains, stabilized, continue Cymbalta 20mg  daily, observe the patient.      Family/ staff Communication: continue AL for care assistance  Labs/tests ordered:  none

## 2016-02-12 NOTE — Assessment & Plan Note (Signed)
Saw Urology 06/06/14-continued with Myrbetriq 25mg.  Stress incontinent in natures. 3-4x/night   

## 2016-02-12 NOTE — Assessment & Plan Note (Signed)
lumbar spondylosis. Regular f/u Dr. French Ana. Receives injections last 01/2013.  05/27/15 Xray left hip/pelvis: spondylotic changes, severe osteoarthritis Continue Tylenol and Tramadol prn for pain, observe the patient, adding Cymbalta may help. Mid to lower back pain more on the right side back, may consider X-ray of thoracic spine. 06/01/15 CT thoracic spine showed no acute changes.  Patient received 10 injection in the back through Dr. Alvester Morin office. She says the previous problems of discomfort in the back and down the leg have completely resolved. Takes Aleve 220mg  bid

## 2016-02-12 NOTE — Assessment & Plan Note (Signed)
In setting of migratory aches and pains, stabilized, continue Cymbalta 20mg daily, observe the patient.  

## 2016-02-12 NOTE — Assessment & Plan Note (Signed)
Controlled, continue Amlodipine 5mg, Losartan 100mg 

## 2016-03-25 ENCOUNTER — Encounter: Payer: Self-pay | Admitting: Nurse Practitioner

## 2016-03-25 ENCOUNTER — Non-Acute Institutional Stay: Payer: Medicare Other | Admitting: Nurse Practitioner

## 2016-03-25 DIAGNOSIS — I10 Essential (primary) hypertension: Secondary | ICD-10-CM

## 2016-03-25 DIAGNOSIS — K219 Gastro-esophageal reflux disease without esophagitis: Secondary | ICD-10-CM

## 2016-03-25 DIAGNOSIS — G8929 Other chronic pain: Secondary | ICD-10-CM

## 2016-03-25 DIAGNOSIS — N318 Other neuromuscular dysfunction of bladder: Secondary | ICD-10-CM | POA: Diagnosis not present

## 2016-03-25 DIAGNOSIS — M5440 Lumbago with sciatica, unspecified side: Secondary | ICD-10-CM

## 2016-03-25 DIAGNOSIS — F4323 Adjustment disorder with mixed anxiety and depressed mood: Secondary | ICD-10-CM | POA: Diagnosis not present

## 2016-03-25 DIAGNOSIS — I872 Venous insufficiency (chronic) (peripheral): Secondary | ICD-10-CM | POA: Insufficient documentation

## 2016-03-25 DIAGNOSIS — L309 Dermatitis, unspecified: Secondary | ICD-10-CM | POA: Diagnosis not present

## 2016-03-25 HISTORY — DX: Dermatitis, unspecified: L30.9

## 2016-03-25 NOTE — Assessment & Plan Note (Signed)
Controlled, continue Amlodipine 5mg, Losartan 100mg 

## 2016-03-25 NOTE — Assessment & Plan Note (Signed)
In setting of migratory aches and pains, stabilized, continue Cymbalta 20mg daily, observe the patient.  

## 2016-03-25 NOTE — Assessment & Plan Note (Signed)
Saw Urology 06/06/14-continued with Myrbetriq 25mg.  Stress incontinent in natures. 3-4x/night   

## 2016-03-25 NOTE — Assessment & Plan Note (Signed)
Stable, continue prn Mylanta, continue Omeprazole 20mg 

## 2016-03-25 NOTE — Assessment & Plan Note (Signed)
lumbar spondylosis. Regular f/u Dr. French Ana. Receives injections last 01/2013.  05/27/15 Xray left hip/pelvis: spondylotic changes, severe osteoarthritis Continue Tylenol and Tramadol prn for pain, observe the patient, adding Cymbalta may help. Mid to lower back pain more on the right side back, may consider X-ray of thoracic spine. 06/01/15 CT thoracic spine showed no acute changes.  Patient received 10 injection in the back through Dr. Alvester Morin office. She says the previous problems of discomfort in the back and down the leg have completely resolved. Takes Aleve 220mg  bid

## 2016-03-25 NOTE — Assessment & Plan Note (Signed)
Forearms and thighs small red spots, the patient stated it occurred about 6 months ago, mild irritation. Prn Mycology bid, observe.

## 2016-03-25 NOTE — Progress Notes (Signed)
Location:  [facility] Room Number: [id number] Place of Service:  ALF [phone]) Provider: Treshaun Carrico, Manxie  NP  Jeanmarie Hubert, MD  Patient Care Team: Estill Dooms, MD as PCP - General (Internal Medicine) Lakiyah Arntson Otho Darner, NP as Nurse Practitioner (Nurse Practitioner)  Extended Emergency Contact Information Primary Emergency Contact: [name] Address: [place], [place] [address] [name] of [place] Phone: [phone] Work Phone: [phone] Mobile Phone: [phone] Relation: Daughter Secondary Emergency Contact: [name] Address: [address] [place] [address] [name] of [place] Phone: [phone] Mobile Phone: [phone] Relation: Son  Code Status:  DNR Goals of care: Advanced Directive information Advanced Directives 03/25/2016  Does Patient Have a Medical Advance Directive? Yes  Type of Paramedic of [place];Living will;Out of facility DNR (pink MOST or yellow form)  Does patient want to make changes to medical advance directive? No - Patient declined  Copy of [place] in Chart? Yes  Pre-existing out of facility DNR order (yellow form or pink MOST form) -     Chief Complaint  Patient presents with  . Acute Visit    Rash on bilateral legs and arms    HPI:  Pt is a 82 y.o. female seen today for an acute visit for Forearms and thighs small red spots, the patient stated it occurred about 6 months ago, mild irritation.   Hx of back and hip pain, She takes Naproxen 220mg  tid for back and hip pain.Patient received 10 injection in the back through Dr. Alvester Morin office. She says the previous problems of discomfort in the back and down the leg have completely resolved. Blood pressure is controlled Amlodipine 5mg , Losartan 100mg . Urinary frequent, managed with Myrbetriq 25mg , mood is stable Cymbalta 20mg . Off Iron for anemia. Last Hgb 12.8  11/14/15    Past Medical History:  Diagnosis Date  . Abnormality of gait 04/28/2010   History of fall onto right shoulder-has participated in PT. Uses walker.     . Acute encephalopathy 10/25/14   Occurred when septic  . Acute lower GI bleeding 10/30/2014  . Anemia   . Aortic atherosclerosis ([place]) 10/27/2014  . Asthma   . Cerebral atrophy 10/27/2014  . Cerebrovascular disease 10/27/2014   Small vessel disease  . Cervical disc disease   . Degenerative disc disease, lumbar 10/27/2014  . Depression   . Diarrhea 10/25/14  . Diverticulosis 10/27/2014  . Fall at nursing home 10/25/2014  . GERD (gastroesophageal reflux disease)   . History of cardiovascular disorder 09/20/2006   X 3 in 2000 now on aspirin.    Marland Kitchen Hyperlipidemia 09/20/2006   05/15/14 LDL 95   . Hypertension   . Lumbosacral spondylosis 10/28/2009  . OSTEOPOROSIS 09/20/2006   Noted 2008    . Overactive bladder 11/29/2013   Myrbetriq prescribed by urology.    . Protein calorie malnutrition ([place])    06/06/15 TP 6.0 albumin 2.9  . SCOLIOSIS 10/28/2009  . Scoliosis 10/27/2014  . TIA (transient ischemic attack) 09/20/2006   Past Surgical History:  Procedure Laterality Date  . ABDOMINAL HYSTERECTOMY    . APPENDECTOMY    . CHOLECYSTECTOMY    . TONSILLECTOMY      Allergies  Allergen Reactions  . Tramadol Other (See Comments)    Hallucinations, agitation/combativeness  . Amoxicillin     Per MAR  . Oxycodone  Other (See Comments)    Per MAR   . Sulfamethoxazole     Per MAR   . Penicillins Rash      Medication List       Accurate as of 03/25/16  4:14 PM. Always use your most recent med list.          acetaminophen 325 MG tablet Commonly known as:  TYLENOL Take 650 mg by mouth every 4 (four) hours as needed for moderate pain or headache.   albuterol 108 (90 Base) MCG/ACT inhaler Commonly known as:  PROVENTIL HFA;VENTOLIN HFA Inhale 2 puffs into the lungs every 6 (six) hours as needed for wheezing or shortness of  breath.   alum & mag hydroxide-simeth 200-200-20 MG/5ML suspension Commonly known as:  MAALOX/MYLANTA Take 30 mLs by mouth as needed for indigestion or heartburn.   amLODipine 5 MG tablet Commonly known as:  NORVASC Take 5 mg by mouth daily with breakfast.   DULoxetine 20 MG capsule Commonly known as:  CYMBALTA Take 20 mg by mouth daily.   loperamide 2 MG capsule Commonly known as:  IMODIUM Take one to two tablets by mouth as needed for diarrhea, Max 16 mg/24 hours call MD if Diarrhea continues   losartan 100 MG tablet Commonly known as:  COZAAR Take 100 mg by mouth daily with breakfast.   MYRBETRIQ 25 MG Tb24 tablet Generic drug:  mirabegron ER Take 25 mg by mouth daily with breakfast. Prescribed by urology   naproxen sodium 220 MG tablet Commonly known as:  ANAPROX Take 220 mg by mouth 2 (two) times daily with a meal.   nitroGLYCERIN 0.4 MG SL tablet Commonly known as:  NITROSTAT Place 0.4 mg under the tongue every 5 (five) minutes as needed for chest pain. After taking three times and no relief call MD.   omeprazole 20 MG capsule Commonly known as:  PRILOSEC Take 20 mg by mouth daily.   polyethylene glycol packet Commonly known as:  MIRALAX / GLYCOLAX Take 17 g by mouth daily.   pyridOXINE 100 MG tablet Commonly known as:  VITAMIN B-6 Take 100 mg by mouth daily with breakfast.       Review of Systems  Constitutional: Negative for chills, diaphoresis and fever.  HENT: Positive for hearing loss. Negative for congestion, ear discharge, ear pain, nosebleeds, sore throat and tinnitus.   Eyes: Negative for photophobia, pain, discharge and redness.  Respiratory: Negative for cough, shortness of breath, wheezing and stridor.   Cardiovascular: Positive for leg swelling. Negative for chest pain and palpitations.       Trace edema BLE  Gastrointestinal: Negative for abdominal pain, blood in stool, constipation, diarrhea (Under control with Questran and Imodium), nausea  and vomiting.  Endocrine: Negative for polydipsia.  Genitourinary: Positive for frequency. Negative for dysuria, flank pain, hematuria and urgency.       1-2x/night. Stress incontinence  Musculoskeletal: Positive for arthralgias and gait problem. Negative for back pain, myalgias and neck pain.       Resolution of Left lower back, left hip, left thigh pain with weight bearing and position changes Chest pain was relieved after Mylanta 05/28/15 Neck pain 05/29/15  Skin: Negative.  Negative for rash.       Forearms and thighs small red spots, the patient stated it occurred about 6 months ago, mild irritation.   Allergic/Immunologic: Negative for environmental allergies.  Neurological: Negative for dizziness, tremors, seizures, weakness and headaches.       Memory loss. Patient had delirium when  septic.  Hematological: Does not bruise/bleed easily.       Resolved anemia secondary to acute blood loss from lower GI bleeding. Plt count has returned to normal.  Psychiatric/Behavioral: Negative for agitation, confusion, hallucinations and suicidal ideas. The patient is nervous/anxious.     Immunization History  Administered Date(s) Administered  . HiB (PRP-OMP) 02/12/2004  . Influenza Split 01/11/2012  . Influenza Whole 01/07/2009  . Influenza-Unspecified 01/11/2014, 01/01/2015, 01/29/2016  . PPD Test 11/14/2014, 06/03/2015, 06/17/2015  . Pneumococcal Polysaccharide-23 04/28/2010  . Tdap 08/24/2011  . Zoster 04/13/2010   Pertinent  Health Maintenance Due  Topic Date Due  . DEXA SCAN  01/28/1991  . PNA vac Low Risk Adult (2 of 2 - PCV13) 04/29/2011  . INFLUENZA VACCINE  Completed   Fall Risk  06/20/2015 05/23/2015 11/09/2014 05/10/2014  Falls in the past year? No No No No   Functional Status Survey:    Vitals:   03/25/16 1422  BP: 140/80  Pulse: 78  Resp: 20  Temp: 98.2 F (36.8 C)  Weight: 153 lb 3.2 oz (69.5 kg)  Height: 5\' 6"  (1.676 m)   Body mass index is 24.73 kg/m. Physical  Exam  Labs reviewed:  Recent Labs  06/01/15 0552 06/02/15 0520 06/03/15 0444 06/06/15 06/10/15 11/14/15  NA 139 135 135  --  139 139  K 3.5 3.8 3.9  --  4.5 4.2  CL 104 100* 102  --   --   --   CO2 26 27 25   --   --   --   GLUCOSE 109* 118* 117*  --   --   --   BUN 9 16 14 19 10 14   CREATININE 0.50 0.67 0.60 0.8 0.6 0.7  CALCIUM 9.8 9.5 9.6  --   --   --     Recent Labs  05/30/15 1248 06/06/15 11/14/15  AST 24 16 17   ALT 28 19 15   ALKPHOS 69 71 67  BILITOT 1.1  --   --   PROT 7.4  --   --   ALBUMIN 3.6  --   --     Recent Labs  05/30/15 1237  06/01/15 0552 06/02/15 0520 06/03/15 0444  06/10/15 07/23/15 11/14/15  WBC 15.3*  < > 10.3 10.7* 9.2  < > 7.3 6.1 6.0  NEUTROABS 12.4*  --   --   --   --   --   --   --   --   HGB 12.9  < > 12.9 12.3 12.1  < > 13.1 14.1 12.8  HCT 38.3  < > 38.7 36.6 36.4  < > 39 42 38  MCV 93.6  < > 94.2 92.4 94.3  --   --   --   --   PLT 415*  < > 456* 486* 535*  < > 602* 335 317  < > = values in this interval not displayed. Lab Results  Component Value Date   TSH 1.54 11/14/2015   Lab Results  Component Value Date   HGBA1C 4.4 11/14/2015   Lab Results  Component Value Date   CHOL 176 05/15/2014   HDL 62 05/15/2014   LDLCALC 95 05/15/2014   LDLDIRECT 114.3 12/08/2007   TRIG 93 05/15/2014   CHOLHDL 2.9 CALC 12/08/2007    Significant Diagnostic Results in last 30 days:  No results found.  Assessment/Plan Dermatitis Forearms and thighs small red spots, the patient stated it occurred about 6 months ago, mild irritation. Prn Mycology bid,  observe.    Essential hypertension Controlled, continue Amlodipine 5mg , Losartan 100mg   GERD (gastroesophageal reflux disease) Stable, continue prn Mylanta, continue Omeprazole 20mg   Hypertonicity of bladder Saw Urology 06/06/14-continued with Myrbetriq 25mg .  Stress incontinent in natures. 3-4x/night  Chronic lower back pain lumbar spondylosis. Regular f/u Dr. French Ana. Receives  injections last 01/2013.  05/27/15 Xray left hip/pelvis: spondylotic changes, severe osteoarthritis Continue Tylenol and Tramadol prn for pain, observe the patient, adding Cymbalta may help. Mid to lower back pain more on the right side back, may consider X-ray of thoracic spine. 06/01/15 CT thoracic spine showed no acute changes.  Patient received 10 injection in the back through Dr. Alvester Morin office. She says the previous problems of discomfort in the back and down the leg have completely resolved. Takes Aleve 220mg  bid   Adjustment disorder with mixed anxiety and depressed mood In setting of migratory aches and pains, stabilized, continue Cymbalta 20mg  daily, observe the patient.      Family/ staff Communication: AL  Labs/tests ordered: none

## 2016-04-02 ENCOUNTER — Non-Acute Institutional Stay: Payer: Medicare Other | Admitting: Internal Medicine

## 2016-04-02 ENCOUNTER — Encounter: Payer: Self-pay | Admitting: Internal Medicine

## 2016-04-02 DIAGNOSIS — M5440 Lumbago with sciatica, unspecified side: Secondary | ICD-10-CM

## 2016-04-02 DIAGNOSIS — G8929 Other chronic pain: Secondary | ICD-10-CM

## 2016-04-02 DIAGNOSIS — D473 Essential (hemorrhagic) thrombocythemia: Secondary | ICD-10-CM | POA: Diagnosis not present

## 2016-04-02 DIAGNOSIS — I10 Essential (primary) hypertension: Secondary | ICD-10-CM | POA: Diagnosis not present

## 2016-04-02 DIAGNOSIS — D75839 Thrombocytosis, unspecified: Secondary | ICD-10-CM

## 2016-04-02 NOTE — Progress Notes (Signed)
Progress Note    Location:  [facility] Room Number: [phone] Place of Service:  ALF [phone]) Provider:  Jeanmarie Hubert, MD  Patient Care Team: Estill Dooms, MD as PCP - General (Internal Medicine) Man Otho Darner, NP as Nurse Practitioner (Nurse Practitioner)  Extended Emergency Contact Information Primary Emergency Contact: [name] Address: [place], [place] [address] [name] of [place] Phone: [phone] Work Phone: [phone] Mobile Phone: [phone] Relation: Daughter Secondary Emergency Contact: [name] Address: [address] [place] [place] [address] [name] of [place] Phone: [phone] Mobile Phone: [phone] Relation: Son  Code Status:  DNR Goals of care: Advanced Directive information Advanced Directives 04/02/2016  Does Patient Have a Medical Advance Directive? Yes  Type of Paramedic of [place];Living will;Out of facility DNR (pink MOST or yellow form)  Does patient want to make changes to medical advance directive? -  Copy of [place] in Chart? Yes  Pre-existing out of facility DNR order (yellow form or pink MOST form) Yellow form placed in chart (order not valid for inpatient use)     Chief Complaint  Patient presents with  . Medical Management of Chronic Issues    routine visit    HPI:  Pt is a 82 y.o. female seen today for medical management of chronic diseases.  Essential hypertension - controlled  Chronic low back pain with sciatica, sciatica laterality unspecified, unspecified back pain laterality - improved  Thrombocytosis (HCC) - last platelet count normal       Past Medical History:  Diagnosis Date  . Abnormality of gait 04/28/2010   History of fall onto right shoulder-has participated in PT. Uses walker.     . Acute encephalopathy 10/25/14   Occurred when septic  . Acute lower GI bleeding  10/30/2014  . Adjustment disorder with mixed anxiety and depressed mood 06/04/2015   11/15/15 Hgb 12.8, Na 139, K 4.2, Bun 14, creat 0.68, TP 5.7, albumin 3.5, TSH 1.54, Hgb a1c 4.4   . Anemia   . Aortic atherosclerosis ([place]) 10/27/2014  . Asthma   . Cerebral atrophy 10/27/2014  . Cerebrovascular disease 10/27/2014   Small vessel disease  . Cervical disc disease   . Chronic lower back pain 06/04/2015   lumbar spondylosis. Regular f/u Dr. French Ana. Receives injections last 01/2013.  05/27/15 Xray left hip/pelvis: spondylotic changes, severe osteoarthritis Continue Tylenol and Tramadol prn for pain, observe the patient, adding Cymbalta may help. Mid to lower back pain more on the right side back, may consider X-ray of thoracic spine. 06/01/15 CT thoracic spine showed no acute changes.  Patient received 10 injection in the back through Dr. Alvester Morin office. She says the previous problems of discomfort in the back and down the leg have completely resolved. Takes Aleve 220mg  bid   . Debility 06/20/2015   11/15/15 Hgb 12.8, Na 139, K 4.2, Bun 14, creat 0.68, TP 5.7, albumin 3.5, TSH 1.54, Hgb a1c 4.4   . Degenerative disc disease, lumbar 10/27/2014  . Depression   . Dermatitis 03/25/2016  . Diarrhea 10/25/14  . Diverticulosis 10/27/2014  . Essential hypertension 09/20/2006   11/15/15 Hgb 12.8, Na 139, K 4.2, Bun 14, creat 0.68, TP 5.7, albumin 3.5, TSH 1.54, Hgb a1c 4.4   . Fall at nursing home 10/25/2014  . GERD (gastroesophageal reflux disease)   . History  of cardiovascular disorder 09/20/2006   X 3 in 2000 now on aspirin.    Marland Kitchen Hyperlipidemia 09/20/2006   05/15/14 LDL 95   . Hypertension   . Lumbosacral spondylosis 10/28/2009  . OSTEOPOROSIS 09/20/2006   Noted 2008    . Overactive bladder 11/29/2013   Myrbetriq prescribed by urology.    . Protein calorie malnutrition ([place])    06/06/15 TP 6.0 albumin 2.9  . SCOLIOSIS 10/28/2009  . Scoliosis 10/27/2014  . Thrombocytosis ([place]) 05/31/2015   06/10/15 plt 602 07/23/15  plt 335 11/15/15 Hgb 12.8, Na 139, K 4.2, Bun 14, creat 0.68, TP 5.7, albumin 3.5, TSH 1.54, Hgb a1c 4.4  . TIA (transient ischemic attack) 09/20/2006   Past Surgical History:  Procedure Laterality Date  . ABDOMINAL HYSTERECTOMY    . APPENDECTOMY    . CHOLECYSTECTOMY    . TONSILLECTOMY      Allergies  Allergen Reactions  . Tramadol Other (See Comments)    Hallucinations, agitation/combativeness  . Amoxicillin     Per MAR  . Oxycodone Other (See Comments)    Per MAR   . Sulfamethoxazole     Per MAR   . Penicillins Rash    Allergies as of 04/02/2016      Reactions   Tramadol Other (See Comments)   Hallucinations, agitation/combativeness   Amoxicillin    Per MAR   Oxycodone Other (See Comments)   Per MAR    Sulfamethoxazole    Per MAR    Penicillins Rash      Medication List       Accurate as of 04/02/16  4:54 PM. Always use your most recent med list.          acetaminophen 325 MG tablet Commonly known as:  TYLENOL Take 650 mg by mouth every 4 (four) hours as needed for moderate pain or headache.   albuterol 108 (90 Base) MCG/ACT inhaler Commonly known as:  PROVENTIL HFA;VENTOLIN HFA Inhale 2 puffs into the lungs every 6 (six) hours as needed for wheezing or shortness of breath.   alum & mag hydroxide-simeth 200-200-20 MG/5ML suspension Commonly known as:  MAALOX/MYLANTA Take 30 mLs by mouth as needed for indigestion or heartburn.   amLODipine 5 MG tablet Commonly known as:  NORVASC Take 5 mg by mouth daily with breakfast.   DULoxetine 20 MG capsule Commonly known as:  CYMBALTA Take 20 mg by mouth daily.   loperamide 2 MG capsule Commonly known as:  IMODIUM Take one to two tablets by mouth as needed for diarrhea, Max 16 mg/24 hours call MD if Diarrhea continues   losartan 100 MG tablet Commonly known as:  COZAAR Take 100 mg by mouth daily with breakfast.   MYRBETRIQ 25 MG Tb24 tablet Generic drug:  mirabegron ER Take 25 mg by mouth daily with  breakfast. Prescribed by urology   naproxen sodium 220 MG tablet Commonly known as:  ANAPROX Take 220 mg by mouth 2 (two) times daily with a meal.   nitroGLYCERIN 0.4 MG SL tablet Commonly known as:  NITROSTAT Place 0.4 mg under the tongue every 5 (five) minutes as needed for chest pain. After taking three times and no relief call MD.   omeprazole 20 MG capsule Commonly known as:  PRILOSEC Take 20 mg by mouth daily.   polyethylene glycol packet Commonly known as:  MIRALAX / GLYCOLAX Take 17 g by mouth daily.   pyridOXINE 100 MG tablet Commonly known as:  VITAMIN B-6 Take 100 mg by mouth daily  with breakfast.       Review of Systems  Constitutional: Negative for chills, diaphoresis and fever.  HENT: Positive for hearing loss. Negative for congestion, ear discharge, ear pain, nosebleeds, sore throat and tinnitus.   Eyes: Negative for photophobia, pain, discharge and redness.  Respiratory: Negative for cough, shortness of breath, wheezing and stridor.   Cardiovascular: Positive for leg swelling. Negative for chest pain and palpitations.       Trace edema BLE  Gastrointestinal: Negative for abdominal pain, blood in stool, constipation, diarrhea (Under control with Questran and Imodium), nausea and vomiting.  Endocrine: Negative for polydipsia.  Genitourinary: Positive for frequency. Negative for dysuria, flank pain, hematuria and urgency.       1-2x/night. Stress incontinence  Musculoskeletal: Positive for arthralgias and gait problem. Negative for back pain, myalgias and neck pain.       Resolution of Left lower back, left hip, left thigh pain with weight bearing and position changes Chest pain was relieved after Mylanta 05/28/15 Neck pain 05/29/15  Skin: Negative.  Negative for rash.       Forearms and thighs small red spots, the patient stated it occurred about 6 months ago, mild irritation.   Allergic/Immunologic: Negative for environmental allergies.  Neurological: Negative  for dizziness, tremors, seizures, weakness and headaches.       Memory loss. Patient had delirium when septic.  Hematological: Does not bruise/bleed easily.       Resolved anemia secondary to acute blood loss from lower GI bleeding. Plt count has returned to normal.  Psychiatric/Behavioral: Negative for agitation, confusion, hallucinations and suicidal ideas. The patient is nervous/anxious.     Immunization History  Administered Date(s) Administered  . HiB (PRP-OMP) 02/12/2004  . Influenza Split 01/11/2012  . Influenza Whole 01/07/2009  . Influenza-Unspecified 01/11/2014, 01/01/2015, 01/29/2016  . PPD Test 11/14/2014, 06/03/2015, 06/17/2015  . Pneumococcal Polysaccharide-23 04/28/2010  . Tdap 08/24/2011  . Zoster 04/13/2010   Pertinent  Health Maintenance Due  Topic Date Due  . DEXA SCAN  01/28/1991  . PNA vac Low Risk Adult (2 of 2 - PCV13) 04/29/2011  . INFLUENZA VACCINE  Completed   Fall Risk  06/20/2015 05/23/2015 11/09/2014 05/10/2014  Falls in the past year? No No No No   Functional Status Survey:    Vitals:   04/02/16 1653  BP: 122/66  Pulse: 76  Temp: 97.5 F (36.4 C)  Weight: 153 lb (69.4 kg)  Height: 5' 5.5" (1.664 m)   Body mass index is 25.07 kg/m. Physical Exam  Constitutional: She is oriented to person, place, and time. She appears well-developed and well-nourished. No distress.  HENT:  Head: Normocephalic and atraumatic.  Right Ear: No lacerations. No drainage. No foreign bodies. No mastoid tenderness. Tympanic membrane is not injected, not scarred, not perforated, not erythematous, not retracted and not bulging. Tympanic membrane mobility is abnormal. No middle ear effusion. Decreased hearing is noted.  Left Ear: No lacerations. No drainage. No foreign bodies. No mastoid tenderness. Tympanic membrane is not injected, not scarred, not perforated, not erythematous, not retracted and not bulging. Tympanic membrane mobility is abnormal.  No middle ear effusion.  Decreased hearing is noted.  Nose: Nose normal.  Mouth/Throat: Oropharynx is clear and moist. No oropharyngeal exudate.  Eyes: Conjunctivae and EOM are normal. Pupils are equal, round, and reactive to light. Right eye exhibits no discharge. Left eye exhibits no discharge. No scleral icterus.  Neck: Normal range of motion. Neck supple. No JVD present. No tracheal deviation present. No  thyromegaly present.  Cardiovascular: Normal rate, regular rhythm, normal heart sounds and intact distal pulses.  Exam reveals no gallop and no friction rub.   No murmur heard. Pulmonary/Chest: Effort normal. No stridor. No respiratory distress. She has no wheezes. She has no rales. She exhibits no tenderness.  Coarse rales posterior mid to lower lungs.  Abdominal: Soft. Bowel sounds are normal. She exhibits no distension and no mass. There is no tenderness. There is no rebound and no guarding.  Genitourinary: Rectal exam shows guaiac negative stool.  Musculoskeletal: She exhibits edema and tenderness.  Resolved Left lower back, left hip, left thigh pain with weight bearing and position changes Chest pain was relieved after Mylanta 05/28/15 Neck pain 05/29/15 BLE trace edema  Lymphadenopathy:    She has no cervical adenopathy.  Neurological: She is alert and oriented to person, place, and time. She has normal reflexes. No cranial nerve deficit. She exhibits normal muscle tone. Coordination normal.  Skin: Skin is warm and dry. No rash noted. She is not diaphoretic. No erythema. No pallor.  Lots of moles.   Psychiatric: Her behavior is normal. Thought content normal.    Labs reviewed:  Recent Labs  06/01/15 0552 06/02/15 0520 06/03/15 0444 06/06/15 06/10/15 11/14/15  NA 139 135 135  --  139 139  K 3.5 3.8 3.9  --  4.5 4.2  CL 104 100* 102  --   --   --   CO2 26 27 25   --   --   --   GLUCOSE 109* 118* 117*  --   --   --   BUN 9 16 14 19 10 14   CREATININE 0.50 0.67 0.60 0.8 0.6 0.7  CALCIUM 9.8 9.5 9.6   --   --   --     Recent Labs  05/30/15 1248 06/06/15 11/14/15  AST 24 16 17   ALT 28 19 15   ALKPHOS 69 71 67  BILITOT 1.1  --   --   PROT 7.4  --   --   ALBUMIN 3.6  --   --     Recent Labs  05/30/15 1237  06/01/15 0552 06/02/15 0520 06/03/15 0444  06/10/15 07/23/15 11/14/15  WBC 15.3*  < > 10.3 10.7* 9.2  < > 7.3 6.1 6.0  NEUTROABS 12.4*  --   --   --   --   --   --   --   --   HGB 12.9  < > 12.9 12.3 12.1  < > 13.1 14.1 12.8  HCT 38.3  < > 38.7 36.6 36.4  < > 39 42 38  MCV 93.6  < > 94.2 92.4 94.3  --   --   --   --   PLT 415*  < > 456* 486* 535*  < > 602* 335 317  < > = values in this interval not displayed. Lab Results  Component Value Date   TSH 1.54 11/14/2015   Lab Results  Component Value Date   HGBA1C 4.4 11/14/2015   Lab Results  Component Value Date   CHOL 176 05/15/2014   HDL 62 05/15/2014   LDLCALC 95 05/15/2014   LDLDIRECT 114.3 12/08/2007   TRIG 93 05/15/2014   CHOLHDL 2.9 CALC 12/08/2007    Assessment/Plan 1. Essential hypertension The current medical regimen is effective;  continue present plan and medications.  2. Chronic low back pain with sciatica, sciatica laterality unspecified, unspecified back pain laterality comfortable  3. Thrombocytosis ([place]) improved

## 2016-04-03 ENCOUNTER — Encounter: Payer: Self-pay | Admitting: Internal Medicine

## 2016-04-22 DIAGNOSIS — L2489 Irritant contact dermatitis due to other agents: Secondary | ICD-10-CM | POA: Diagnosis not present

## 2016-04-22 DIAGNOSIS — L821 Other seborrheic keratosis: Secondary | ICD-10-CM | POA: Diagnosis not present

## 2016-04-22 DIAGNOSIS — L814 Other melanin hyperpigmentation: Secondary | ICD-10-CM | POA: Diagnosis not present

## 2016-05-13 ENCOUNTER — Encounter: Payer: Self-pay | Admitting: Nurse Practitioner

## 2016-05-13 ENCOUNTER — Non-Acute Institutional Stay: Payer: Medicare Other | Admitting: Nurse Practitioner

## 2016-05-13 DIAGNOSIS — K219 Gastro-esophageal reflux disease without esophagitis: Secondary | ICD-10-CM | POA: Diagnosis not present

## 2016-05-13 DIAGNOSIS — N3281 Overactive bladder: Secondary | ICD-10-CM

## 2016-05-13 DIAGNOSIS — M5440 Lumbago with sciatica, unspecified side: Secondary | ICD-10-CM

## 2016-05-13 DIAGNOSIS — L309 Dermatitis, unspecified: Secondary | ICD-10-CM

## 2016-05-13 DIAGNOSIS — G8929 Other chronic pain: Secondary | ICD-10-CM

## 2016-05-13 DIAGNOSIS — D75839 Thrombocytosis, unspecified: Secondary | ICD-10-CM

## 2016-05-13 DIAGNOSIS — I10 Essential (primary) hypertension: Secondary | ICD-10-CM

## 2016-05-13 DIAGNOSIS — D473 Essential (hemorrhagic) thrombocythemia: Secondary | ICD-10-CM | POA: Diagnosis not present

## 2016-05-13 DIAGNOSIS — F4323 Adjustment disorder with mixed anxiety and depressed mood: Secondary | ICD-10-CM

## 2016-05-13 NOTE — Assessment & Plan Note (Signed)
Forearms and thighs small red spots, the patient stated it occurred about 6 months ago, mild irritation. Prn Mycology bid, observe.

## 2016-05-13 NOTE — Progress Notes (Signed)
Location:  [facility] Room Number: [place] of Service:  ALF [phone]) Provider:  Saryiah Bencosme, Manxie  NP  Jeanmarie Hubert, MD  Patient Care Team: Estill [place], MD as PCP - General (Internal Medicine) Kymberli Wiegand Otho Darner, NP as Nurse Practitioner (Nurse Practitioner)  Extended Emergency Contact Information Primary Emergency Contact: [name] Address: [place], [place] [address] [name] of [place] Phone: [phone] Work Phone: [phone] Mobile Phone: [phone] Relation: Daughter Secondary Emergency Contact: [name] Address: [address] [place] [address] [name] of [place] Phone: [phone] Mobile Phone: [phone] Relation: Son  Code Status:  DNR Goals of care: Advanced Directive information Advanced Directives 05/13/2016  Does Patient Have a Medical Advance Directive? Yes  Type of Paramedic of [place];Living will;Out of facility DNR (pink MOST or yellow form)  Does patient want to make changes to medical advance directive? No - Patient declined  Copy of [place] in Chart? Yes  Pre-existing out of facility DNR order (yellow form or pink MOST form) Yellow form placed in chart (order not valid for inpatient use)     Chief Complaint  Patient presents with  . Medical Management of Chronic Issues    HPI:  Pt is a 83 y.o. female seen today for medical management of chronic diseases.      Hx of back and hip pain, She takes Naproxen 220mg  tid for back and hip pain.Patient received 10 injection in the back through Dr. Alvester Morin office. She says the previous problems of discomfort in the back and down the leg have completely resolved. Blood pressure is controlled Amlodipine 5mg , Losartan 100mg . Urinary frequent, managed with Myrbetriq 25mg , mood is stable Cymbalta 20mg . OffIron for anemia. Last Hgb 12.8 11/14/15 Past Medical History:    Diagnosis Date  . Abnormality of gait 04/28/2010   History of fall onto right shoulder-has participated in PT. Uses walker.     . Acute encephalopathy 10/25/14   Occurred when septic  . Acute lower GI bleeding 10/30/2014  . Adjustment disorder with mixed anxiety and depressed mood 06/04/2015   11/15/15 Hgb 12.8, Na 139, K 4.2, Bun 14, creat 0.68, TP 5.7, albumin 3.5, TSH 1.54, Hgb a1c 4.4   . Anemia   . Aortic atherosclerosis ([place]) 10/27/2014  . Asthma   . Cerebral atrophy 10/27/2014  . Cerebrovascular disease 10/27/2014   Small vessel disease  . Cervical disc disease   . Chronic lower back pain 06/04/2015   lumbar spondylosis. Regular f/u Dr. French Ana. Receives injections last 01/2013.  05/27/15 Xray left hip/pelvis: spondylotic changes, severe osteoarthritis Continue Tylenol and Tramadol prn for pain, observe the patient, adding Cymbalta may help. Mid to lower back pain more on the right side back, may consider X-ray of thoracic spine. 06/01/15 CT thoracic spine showed no acute changes.  Patient received 10 injection in the back through Dr. Alvester Morin office. She says the previous problems of discomfort in the back and down the leg have completely resolved. Takes Aleve 220mg  bid   . Debility 06/20/2015   11/15/15 Hgb 12.8, Na 139, K 4.2, Bun 14, creat 0.68, TP 5.7, albumin 3.5, TSH 1.54, Hgb a1c 4.4   . Degenerative disc disease, lumbar 10/27/2014  . Depression   . Dermatitis 03/25/2016  . Diarrhea 10/25/14  . Diverticulosis 10/27/2014  . Essential hypertension 09/20/2006  11/15/15 Hgb 12.8, Na 139, K 4.2, Bun 14, creat 0.68, TP 5.7, albumin 3.5, TSH 1.54, Hgb a1c 4.4   . Fall at nursing home 10/25/2014  . GERD (gastroesophageal reflux disease)   . History of cardiovascular disorder 09/20/2006   X 3 in 2000 now on aspirin.    Marland Kitchen Hyperlipidemia 09/20/2006   05/15/14 LDL 95   . Hypertension   . Lumbosacral spondylosis 10/28/2009  . OSTEOPOROSIS 09/20/2006   Noted 2008    . Overactive bladder 11/29/2013    Myrbetriq prescribed by urology.    . Protein calorie malnutrition ([place])    06/06/15 TP 6.0 albumin 2.9  . SCOLIOSIS 10/28/2009  . Scoliosis 10/27/2014  . Thrombocytosis ([place]) 05/31/2015   06/10/15 plt 602 07/23/15 plt 335 11/15/15 Hgb 12.8, Na 139, K 4.2, Bun 14, creat 0.68, TP 5.7, albumin 3.5, TSH 1.54, Hgb a1c 4.4  . TIA (transient ischemic attack) 09/20/2006   Past Surgical History:  Procedure Laterality Date  . ABDOMINAL HYSTERECTOMY    . APPENDECTOMY    . CHOLECYSTECTOMY    . TONSILLECTOMY      Allergies  Allergen Reactions  . Tramadol Other (See Comments)    Hallucinations, agitation/combativeness  . Amoxicillin     Per MAR  . Oxycodone Other (See Comments)    Per MAR   . Sulfamethoxazole     Per MAR   . Penicillins Rash    Allergies as of 05/13/2016      Reactions   Tramadol Other (See Comments)   Hallucinations, agitation/combativeness   Amoxicillin    Per MAR   Oxycodone Other (See Comments)   Per MAR    Sulfamethoxazole    Per MAR    Penicillins Rash      Medication List       Accurate as of 05/13/16  1:35 PM. Always use your most recent med list.          acetaminophen 325 MG tablet Commonly known as:  TYLENOL Take 650 mg by mouth every 4 (four) hours as needed for moderate pain or headache.   albuterol 108 (90 Base) MCG/ACT inhaler Commonly known as:  PROVENTIL HFA;VENTOLIN HFA Inhale 2 puffs into the lungs every 6 (six) hours as needed for wheezing or shortness of breath.   alum & mag hydroxide-simeth 200-200-20 MG/5ML suspension Commonly known as:  MAALOX/MYLANTA Take 30 mLs by mouth as needed for indigestion or heartburn.   amLODipine 5 MG tablet Commonly known as:  NORVASC Take 5 mg by mouth daily with breakfast.   DULoxetine 20 MG capsule Commonly known as:  CYMBALTA Take 20 mg by mouth daily.   loperamide 2 MG capsule Commonly known as:  IMODIUM Take one to two tablets by mouth as needed for diarrhea, Max 16 mg/24 hours call MD if  Diarrhea continues   losartan 100 MG tablet Commonly known as:  COZAAR Take 100 mg by mouth daily with breakfast.   MYRBETRIQ 25 MG Tb24 tablet Generic drug:  mirabegron ER Take 25 mg by mouth daily with breakfast. Prescribed by urology   naproxen sodium 220 MG tablet Commonly known as:  ANAPROX Take 220 mg by mouth 2 (two) times daily with a meal.   nitroGLYCERIN 0.4 MG SL tablet Commonly known as:  NITROSTAT Place 0.4 mg under the tongue every 5 (five) minutes as needed for chest pain. After taking three times and no relief call MD.   omeprazole 20 MG capsule Commonly known as:  PRILOSEC Take 20 mg by  mouth daily.   polyethylene glycol packet Commonly known as:  MIRALAX / GLYCOLAX Take 17 g by mouth daily.   pyridOXINE 100 MG tablet Commonly known as:  VITAMIN B-6 Take 100 mg by mouth daily with breakfast.       Review of Systems  Constitutional: Negative for chills, diaphoresis and fever.  HENT: Positive for hearing loss. Negative for congestion, ear discharge, ear pain, nosebleeds, sore throat and tinnitus.   Eyes: Negative for photophobia, pain, discharge and redness.  Respiratory: Negative for cough, shortness of breath, wheezing and stridor.   Cardiovascular: Positive for leg swelling. Negative for chest pain and palpitations.       Trace edema BLE  Gastrointestinal: Negative for abdominal pain, blood in stool, constipation, diarrhea (Under control with Questran and Imodium), nausea and vomiting.  Endocrine: Negative for polydipsia.  Genitourinary: Positive for frequency. Negative for dysuria, flank pain, hematuria and urgency.       1-2x/night. Stress incontinence  Musculoskeletal: Positive for arthralgias and gait problem. Negative for back pain, myalgias and neck pain.       Resolution of Left lower back, left hip, left thigh pain with weight bearing and position changes Chest pain was relieved after Mylanta 05/28/15 Neck pain 05/29/15  Skin: Negative.   Negative for rash.       Forearms and thighs small red spots, the patient stated it occurred about 6 months ago, mild irritation.   Allergic/Immunologic: Negative for environmental allergies.  Neurological: Negative for dizziness, tremors, seizures, weakness and headaches.       Memory loss. Patient had delirium when septic.  Hematological: Does not bruise/bleed easily.       Resolved anemia secondary to acute blood loss from lower GI bleeding. Plt count has returned to normal.  Psychiatric/Behavioral: Negative for agitation, confusion, hallucinations and suicidal ideas. The patient is nervous/anxious.     Immunization History  Administered Date(s) Administered  . HiB (PRP-OMP) 02/12/2004  . Influenza Split 01/11/2012  . Influenza Whole 01/07/2009  . Influenza-Unspecified 01/11/2014, 01/01/2015, 01/29/2016  . PPD Test 11/14/2014, 06/03/2015, 06/17/2015  . Pneumococcal Polysaccharide-23 04/28/2010  . Tdap 08/24/2011  . Zoster 04/13/2010   Pertinent  Health Maintenance Due  Topic Date Due  . DEXA SCAN  01/28/1991  . PNA vac Low Risk Adult (2 of 2 - PCV13) 04/29/2011  . INFLUENZA VACCINE  Completed   Fall Risk  06/20/2015 05/23/2015 11/09/2014 05/10/2014  Falls in the past year? No No No No   Functional Status Survey:    Vitals:   05/13/16 1057  BP: 138/86  Pulse: 76  Resp: 18  Temp: 97.8 F (36.6 C)  Weight: 154 lb 3.2 oz (69.9 kg)  Height: 5' 5.5" (1.664 m)   Body mass index is 25.27 kg/m. Physical Exam  Constitutional: She is oriented to person, place, and time. She appears well-developed and well-nourished. No distress.  HENT:  Head: Normocephalic and atraumatic.  Right Ear: No lacerations. No drainage. No foreign bodies. No mastoid tenderness. Tympanic membrane is not injected, not scarred, not perforated, not erythematous, not retracted and not bulging. Tympanic membrane mobility is abnormal. No middle ear effusion. Decreased hearing is noted.  Left Ear: No lacerations.  No drainage. No foreign bodies. No mastoid tenderness. Tympanic membrane is not injected, not scarred, not perforated, not erythematous, not retracted and not bulging. Tympanic membrane mobility is abnormal.  No middle ear effusion. Decreased hearing is noted.  Nose: Nose normal.  Mouth/Throat: Oropharynx is clear and moist. No oropharyngeal exudate.  Eyes: Conjunctivae and EOM are normal. Pupils are equal, round, and reactive to light. Right eye exhibits no discharge. Left eye exhibits no discharge. No scleral icterus.  Neck: Normal range of motion. Neck supple. No JVD present. No tracheal deviation present. No thyromegaly present.  Cardiovascular: Normal rate, regular rhythm, normal heart sounds and intact distal pulses.  Exam reveals no gallop and no friction rub.   No murmur heard. Pulmonary/Chest: Effort normal. No stridor. No respiratory distress. She has no wheezes. She has no rales. She exhibits no tenderness.  Coarse rales posterior mid to lower lungs.  Abdominal: Soft. Bowel sounds are normal. She exhibits no distension and no mass. There is no tenderness. There is no rebound and no guarding.  Genitourinary: Rectal exam shows guaiac negative stool.  Musculoskeletal: She exhibits edema and tenderness.  Resolved Left lower back, left hip, left thigh pain with weight bearing and position changes Chest pain was relieved after Mylanta 05/28/15 Neck pain 05/29/15 BLE trace edema  Lymphadenopathy:    She has no cervical adenopathy.  Neurological: She is alert and oriented to person, place, and time. She has normal reflexes. No cranial nerve deficit. She exhibits normal muscle tone. Coordination normal.  Skin: Skin is warm and dry. No rash noted. She is not diaphoretic. No erythema. No pallor.  Lots of moles. Red spots arms and legs.   Psychiatric: Her behavior is normal. Thought content normal.    Labs reviewed:  Recent Labs  06/01/15 0552 06/02/15 0520 06/03/15 0444 06/06/15 06/10/15  11/14/15  NA 139 135 135  --  139 139  K 3.5 3.8 3.9  --  4.5 4.2  CL 104 100* 102  --   --   --   CO2 26 27 25   --   --   --   GLUCOSE 109* 118* 117*  --   --   --   BUN 9 16 14 19 10 14   CREATININE 0.50 0.67 0.60 0.8 0.6 0.7  CALCIUM 9.8 9.5 9.6  --   --   --     Recent Labs  05/30/15 1248 06/06/15 11/14/15  AST 24 16 17   ALT 28 19 15   ALKPHOS 69 71 67  BILITOT 1.1  --   --   PROT 7.4  --   --   ALBUMIN 3.6  --   --     Recent Labs  05/30/15 1237  06/01/15 0552 06/02/15 0520 06/03/15 0444  06/10/15 07/23/15 11/14/15  WBC 15.3*  < > 10.3 10.7* 9.2  < > 7.3 6.1 6.0  NEUTROABS 12.4*  --   --   --   --   --   --   --   --   HGB 12.9  < > 12.9 12.3 12.1  < > 13.1 14.1 12.8  HCT 38.3  < > 38.7 36.6 36.4  < > 39 42 38  MCV 93.6  < > 94.2 92.4 94.3  --   --   --   --   PLT 415*  < > 456* 486* 535*  < > 602* 335 317  < > = values in this interval not displayed. Lab Results  Component Value Date   TSH 1.54 11/14/2015   Lab Results  Component Value Date   HGBA1C 4.4 11/14/2015   Lab Results  Component Value Date   CHOL 176 05/15/2014   HDL 62 05/15/2014   LDLCALC 95 05/15/2014   LDLDIRECT 114.3 12/08/2007   TRIG 93 05/15/2014  CHOLHDL 2.9 CALC 12/08/2007    Significant Diagnostic Results in last 30 days:  No results found.  Assessment/Plan Essential hypertension Controlled, continue Amlodipine 5mg , Losartan 100mg , update CMP CBC TSH  GERD (gastroesophageal reflux disease) Stable, continue prn Mylanta, continue Omeprazole 20mg   Overactive bladder Saw Urology 06/06/14-continued with Myrbetriq 25mg .  Stress incontinent in natures. 3-4x/night   Thrombocytosis ([facility]) plt 317 11/14/15, update CBC  Chronic lower back pain Okay since 04/08/16 pharm Aleve 220mg  bid prn    Adjustment disorder with mixed anxiety and depressed mood In setting of migratory aches and pains, stabilized, continue Cymbalta 20mg  daily, observe the patient.       Family/ staff  Communication: AL  Labs/tests ordered:  CBC CMP TSH

## 2016-05-13 NOTE — Assessment & Plan Note (Signed)
Controlled, continue Amlodipine 5mg , Losartan 100mg , update CMP CBC TSH

## 2016-05-13 NOTE — Assessment & Plan Note (Signed)
Saw Urology 06/06/14-continued with Myrbetriq 25mg.  Stress incontinent in natures. 3-4x/night   

## 2016-05-13 NOTE — Assessment & Plan Note (Signed)
In setting of migratory aches and pains, stabilized, continue Cymbalta 20mg daily, observe the patient.  

## 2016-05-13 NOTE — Assessment & Plan Note (Signed)
Stable, continue prn Mylanta, continue Omeprazole 20mg 

## 2016-05-13 NOTE — Assessment & Plan Note (Addendum)
plt 317 11/14/15, update CBC

## 2016-05-13 NOTE — Assessment & Plan Note (Addendum)
Okay since 04/08/16 pharm Aleve 220mg  bid prn

## 2016-05-14 DIAGNOSIS — G459 Transient cerebral ischemic attack, unspecified: Secondary | ICD-10-CM | POA: Diagnosis not present

## 2016-05-14 DIAGNOSIS — M5137 Other intervertebral disc degeneration, lumbosacral region: Secondary | ICD-10-CM | POA: Diagnosis not present

## 2016-05-14 DIAGNOSIS — I10 Essential (primary) hypertension: Secondary | ICD-10-CM | POA: Diagnosis not present

## 2016-05-14 DIAGNOSIS — G8929 Other chronic pain: Secondary | ICD-10-CM | POA: Diagnosis not present

## 2016-05-14 DIAGNOSIS — K219 Gastro-esophageal reflux disease without esophagitis: Secondary | ICD-10-CM | POA: Diagnosis not present

## 2016-05-14 LAB — CBC AND DIFFERENTIAL
HCT: 38 % (ref 36–46)
HEMOGLOBIN: 13 g/dL (ref 12.0–16.0)
Platelets: 371 10*3/uL (ref 150–399)
WBC: 6.6 10*3/mL

## 2016-05-14 LAB — BASIC METABOLIC PANEL
BUN: 14 mg/dL (ref 4–21)
Creatinine: 0.7 mg/dL (ref <=1.1)
Glucose: 93 mg/dL
POTASSIUM: 4.5 mmol/L (ref 3.4–5.3)
SODIUM: 139 mmol/L (ref 137–147)

## 2016-05-14 LAB — HEPATIC FUNCTION PANEL
ALT: 17 U/L (ref 7–35)
AST: 19 U/L (ref 13–35)
Alkaline Phosphatase: 64 U/L (ref 25–125)
Bilirubin, Total: 0.7 mg/dL

## 2016-05-14 LAB — TSH: TSH: 1.42 u[IU]/mL (ref <=5.90)

## 2016-05-21 ENCOUNTER — Other Ambulatory Visit: Payer: Self-pay | Admitting: *Deleted

## 2016-06-10 ENCOUNTER — Non-Acute Institutional Stay: Payer: Medicare Other | Admitting: Nurse Practitioner

## 2016-06-10 ENCOUNTER — Encounter: Payer: Self-pay | Admitting: Nurse Practitioner

## 2016-06-10 DIAGNOSIS — K219 Gastro-esophageal reflux disease without esophagitis: Secondary | ICD-10-CM

## 2016-06-10 DIAGNOSIS — M5440 Lumbago with sciatica, unspecified side: Secondary | ICD-10-CM

## 2016-06-10 DIAGNOSIS — G8929 Other chronic pain: Secondary | ICD-10-CM

## 2016-06-10 DIAGNOSIS — N3281 Overactive bladder: Secondary | ICD-10-CM

## 2016-06-10 DIAGNOSIS — I10 Essential (primary) hypertension: Secondary | ICD-10-CM | POA: Diagnosis not present

## 2016-06-10 DIAGNOSIS — F4323 Adjustment disorder with mixed anxiety and depressed mood: Secondary | ICD-10-CM | POA: Diagnosis not present

## 2016-06-10 NOTE — Assessment & Plan Note (Signed)
Saw Urology 06/06/14-continued with Myrbetriq 25mg.  Stress incontinent in natures. 3-4x/night   

## 2016-06-10 NOTE — Assessment & Plan Note (Addendum)
Okay since 04/08/16 pharm Aleve 220mg  bid

## 2016-06-10 NOTE — Assessment & Plan Note (Signed)
Stable, continue prn Mylanta, Omeprazole 20mg 

## 2016-06-10 NOTE — Assessment & Plan Note (Signed)
In setting of migratory aches and pains, stabilized, continue Cymbalta 20mg daily, observe the patient.  

## 2016-06-10 NOTE — Assessment & Plan Note (Addendum)
Controlled, continue Amlodipine 5mg , Losartan 100mg , 05/14/16 wbc 6.6, Hgb 13.0, plt 371, na 139, K 4.5, Bun 14, creat 0.73, TSH 1.42

## 2016-06-10 NOTE — Progress Notes (Deleted)
Location:  [facility] Room Number: [id number] Place of Service:  ALF [phone]) Provider:  [name], Manxie  NP  Jeanmarie Hubert, MD  Patient Care Team: Estill [place], MD as PCP - General (Internal Medicine) Man Otho Darner, NP as Nurse Practitioner (Nurse Practitioner)  Extended Emergency Contact Information Primary Emergency Contact: [name] Address: [place], [place] [address] [name] of [place] Phone: [phone] Work Phone: [phone] Mobile Phone: [phone] Relation: Daughter Secondary Emergency Contact: [name] Address: [address] [place] [address] [name] of [place] Phone: [phone] Mobile Phone: [phone] Relation: Son  Code Status:  DNR Goals of care: Advanced Directive information Advanced Directives 05/13/2016  Does Patient Have a Medical Advance Directive? Yes  Type of Paramedic of [place];Living will;Out of facility DNR (pink MOST or yellow form)  Does patient want to make changes to medical advance directive? No - Patient declined  Copy of [place] in Chart? Yes  Pre-existing out of facility DNR order (yellow form or pink MOST form) Yellow form placed in chart (order not valid for inpatient use)     Chief Complaint  Patient presents with  . Medical Management of Chronic Issues    HPI:  Pt is a 83 y.o. female seen today for medical management of chronic diseases.     Past Medical History:  Diagnosis Date  . Abnormality of gait 04/28/2010   History of fall onto right shoulder-has participated in PT. Uses walker.     . Acute encephalopathy 10/25/14   Occurred when septic  . Acute lower GI bleeding 10/30/2014  . Adjustment disorder with mixed anxiety and depressed mood 06/04/2015   11/15/15 Hgb 12.8, Na 139, K 4.2, Bun 14, creat 0.68, TP 5.7, albumin 3.5, TSH 1.54, Hgb a1c 4.4   . Anemia   . Aortic atherosclerosis  ([place]) 10/27/2014  . Asthma   . Cerebral atrophy 10/27/2014  . Cerebrovascular disease 10/27/2014   Small vessel disease  . Cervical disc disease   . Chronic lower back pain 06/04/2015   lumbar spondylosis. Regular f/u Dr. French Ana. Receives injections last 01/2013.  05/27/15 Xray left hip/pelvis: spondylotic changes, severe osteoarthritis Continue Tylenol and Tramadol prn for pain, observe the patient, adding Cymbalta may help. Mid to lower back pain more on the right side back, may consider X-ray of thoracic spine. 06/01/15 CT thoracic spine showed no acute changes.  Patient received 10 injection in the back through Dr. Alvester Morin office. She says the previous problems of discomfort in the back and down the leg have completely resolved. Takes Aleve 220mg  bid   . Debility 06/20/2015   11/15/15 Hgb 12.8, Na 139, K 4.2, Bun 14, creat 0.68, TP 5.7, albumin 3.5, TSH 1.54, Hgb a1c 4.4   . Degenerative disc disease, lumbar 10/27/2014  . Depression   . Dermatitis 03/25/2016  . Diarrhea 10/25/14  . Diverticulosis 10/27/2014  . Essential hypertension 09/20/2006   11/15/15 Hgb 12.8, Na 139, K 4.2, Bun 14, creat 0.68, TP 5.7, albumin 3.5, TSH 1.54, Hgb a1c 4.4   . Fall at nursing home 10/25/2014  . GERD (gastroesophageal reflux disease)   . History of cardiovascular disorder 09/20/2006   X 3 in 2000 now on aspirin.    Marland Kitchen Hyperlipidemia 09/20/2006   05/15/14 LDL 95   . Hypertension   . Lumbosacral spondylosis 10/28/2009  .  OSTEOPOROSIS 09/20/2006   Noted 2008    . Overactive bladder 11/29/2013   Myrbetriq prescribed by urology.    . Protein calorie malnutrition ([place])    06/06/15 TP 6.0 albumin 2.9  . SCOLIOSIS 10/28/2009  . Scoliosis 10/27/2014  . Thrombocytosis ([place]) 05/31/2015   06/10/15 plt 602 07/23/15 plt 335 11/15/15 Hgb 12.8, Na 139, K 4.2, Bun 14, creat 0.68, TP 5.7, albumin 3.5, TSH 1.54, Hgb a1c 4.4  . TIA (transient ischemic attack) 09/20/2006   Past Surgical History:  Procedure Laterality Date  . ABDOMINAL  HYSTERECTOMY    . APPENDECTOMY    . CHOLECYSTECTOMY    . TONSILLECTOMY      Allergies  Allergen Reactions  . Tramadol Other (See Comments)    Hallucinations, agitation/combativeness  . Amoxicillin     Per MAR  . Oxycodone Other (See Comments)    Per MAR   . Sulfamethoxazole     Per MAR   . Penicillins Rash    Allergies as of 06/10/2016      Reactions   Tramadol Other (See Comments)   Hallucinations, agitation/combativeness   Amoxicillin    Per MAR   Oxycodone Other (See Comments)   Per MAR    Sulfamethoxazole    Per MAR    Penicillins Rash      Medication List       Accurate as of 06/10/16 11:35 AM. Always use your most recent med list.          acetaminophen 325 MG tablet Commonly known as:  TYLENOL Take 650 mg by mouth every 4 (four) hours as needed for moderate pain or headache.   albuterol 108 (90 Base) MCG/ACT inhaler Commonly known as:  PROVENTIL HFA;VENTOLIN HFA Inhale 2 puffs into the lungs every 6 (six) hours as needed for wheezing or shortness of breath.   alum & mag hydroxide-simeth 200-200-20 MG/5ML suspension Commonly known as:  MAALOX/MYLANTA Take 30 mLs by mouth as needed for indigestion or heartburn.   amLODipine 5 MG tablet Commonly known as:  NORVASC Take 5 mg by mouth daily with breakfast.   DULoxetine 20 MG capsule Commonly known as:  CYMBALTA Take 20 mg by mouth daily.   loperamide 2 MG capsule Commonly known as:  IMODIUM Take one to two tablets by mouth as needed for diarrhea, Max 16 mg/24 hours call MD if Diarrhea continues   losartan 100 MG tablet Commonly known as:  COZAAR Take 100 mg by mouth daily with breakfast.   MYRBETRIQ 25 MG Tb24 tablet Generic drug:  mirabegron ER Take 25 mg by mouth daily with breakfast. Prescribed by urology   naproxen sodium 220 MG tablet Commonly known as:  ANAPROX Take 220 mg by mouth 2 (two) times daily with a meal.   nitroGLYCERIN 0.4 MG SL tablet Commonly known as:  NITROSTAT Place  0.4 mg under the tongue every 5 (five) minutes as needed for chest pain. After taking three times and no relief call MD.   omeprazole 20 MG capsule Commonly known as:  PRILOSEC Take 20 mg by mouth daily.   polyethylene glycol packet Commonly known as:  MIRALAX / GLYCOLAX Take 17 g by mouth daily.   pyridOXINE 100 MG tablet Commonly known as:  VITAMIN B-6 Take 100 mg by mouth daily with breakfast.       Review of Systems  Immunization History  Administered Date(s) Administered  . HiB (PRP-OMP) 02/12/2004  . Influenza Split 01/11/2012  . Influenza Whole 01/07/2009  . Influenza-Unspecified 01/11/2014,  01/01/2015, 01/29/2016  . PPD Test 11/14/2014, 06/03/2015, 06/17/2015  . Pneumococcal Polysaccharide-23 04/28/2010  . Tdap 08/24/2011  . Zoster 04/13/2010   Pertinent  Health Maintenance Due  Topic Date Due  . DEXA SCAN  01/28/1991  . PNA vac Low Risk Adult (2 of 2 - PCV13) 04/29/2011  . INFLUENZA VACCINE  Completed   Fall Risk  06/20/2015 05/23/2015 11/09/2014 05/10/2014  Falls in the past year? No No No No   Functional Status Survey:    Vitals:   06/10/16 1108  BP: 140/68  Pulse: 90  Resp: 18  Temp: 97.9 F (36.6 C)  Weight: 154 lb 3.2 oz (69.9 kg)  Height: 5' 5.5" (1.664 m)   Body mass index is 25.27 kg/m. Physical Exam  Labs reviewed:  Recent Labs  11/14/15 05/14/16  NA 139 139  K 4.2 4.5  BUN 14 14  CREATININE 0.7 0.7    Recent Labs  11/14/15 05/14/16  AST 17 19  ALT 15 17  ALKPHOS 67 64    Recent Labs  07/23/15 11/14/15 05/14/16  WBC 6.1 6.0 6.6  HGB 14.1 12.8 13.0  HCT 42 38 38  PLT 335 317 371   Lab Results  Component Value Date   TSH 1.42 05/14/2016   Lab Results  Component Value Date   HGBA1C 4.4 11/14/2015   Lab Results  Component Value Date   CHOL 176 05/15/2014   HDL 62 05/15/2014   LDLCALC 95 05/15/2014   LDLDIRECT 114.3 12/08/2007   TRIG 93 05/15/2014   CHOLHDL 2.9 CALC 12/08/2007    Significant Diagnostic Results in  last 30 days:  No results found.  Assessment/Plan There are no diagnoses linked to this encounter.   Family/ staff Communication:   Labs/tests ordered:

## 2016-06-10 NOTE — [place] Notes (Signed)
Location:  [facility] Room Number: [id number] Place of Service:  [phone]) Provider:  Shaletta Hinostroza, Manxie   NP  Jeanmarie Hubert, MD  Patient Care Team: Estill Dooms, MD as PCP - General (Internal Medicine) Jordie Schreur Otho Darner, NP as Nurse Practitioner (Nurse Practitioner)  Extended Emergency Contact Information Primary Emergency Contact: [name] Address: [place], [place] [address] [name] of [place] Phone: [phone] Work Phone: [phone] Mobile Phone: [phone] Relation: Daughter Secondary Emergency Contact: [name] Address: [address] [place] [address] [name] of [place] Phone: [phone] Mobile Phone: [phone] Relation: Son  Code Status:  DNR Goals of care: Advanced Directive information Advanced Directives 05/13/2016  Does Patient Have a Medical Advance Directive? Yes  Type of Paramedic of [place];Living will;Out of facility DNR (pink MOST or yellow form)  Does patient want to make changes to medical advance directive? No - Patient declined  Copy of [place] in Chart? Yes  Pre-existing out of facility DNR order (yellow form or pink MOST form) Yellow form placed in chart (order not valid for inpatient use)     Chief Complaint  Patient presents with  . Medical Management of Chronic Issues    HPI:  Pt is a 83 y.o. female seen today for medical management of chronic diseases.    Hx of back and hip pain, She takes Naproxen 220mg  bid for back and hip pain.Patient received 10 injection in the back through Dr. Alvester Morin office. She says the previous problems of discomfort in the back and down the leg have completely resolved. Blood pressure is controlled Amlodipine 5mg , Losartan 100mg . Urinary frequent, managed with Myrbetriq 25mg , mood is stable Cymbalta 20mg . OffIron for anemia. Last Hgb 13.0 05/14/16   Past Medical History:    Diagnosis Date  . Abnormality of gait 04/28/2010   History of fall onto right shoulder-has participated in PT. Uses walker.     . Acute encephalopathy 10/25/14   Occurred when septic  . Acute lower GI bleeding 10/30/2014  . Adjustment disorder with mixed anxiety and depressed mood 06/04/2015   11/15/15 Hgb 12.8, Na 139, K 4.2, Bun 14, creat 0.68, TP 5.7, albumin 3.5, TSH 1.54, Hgb a1c 4.4   . Anemia   . Aortic atherosclerosis ([place]) 10/27/2014  . Asthma   . Cerebral atrophy 10/27/2014  . Cerebrovascular disease 10/27/2014   Small vessel disease  . Cervical disc disease   . Chronic lower back pain 06/04/2015   lumbar spondylosis. Regular f/u Dr. French Ana. Receives injections last 01/2013.  05/27/15 Xray left hip/pelvis: spondylotic changes, severe osteoarthritis Continue Tylenol and Tramadol prn for pain, observe the patient, adding Cymbalta may help. Mid to lower back pain more on the right side back, may consider X-ray of thoracic spine. 06/01/15 CT thoracic spine showed no acute changes.  Patient received 10 injection in the back through Dr. Alvester Morin office. She says the previous problems of discomfort in the back and down the leg have completely resolved. Takes Aleve 220mg  bid   . Debility 06/20/2015   11/15/15 Hgb 12.8, Na 139, K 4.2, Bun 14, creat 0.68, TP 5.7, albumin 3.5, TSH 1.54, Hgb a1c 4.4   . Degenerative disc disease, lumbar 10/27/2014  . Depression   . Dermatitis 03/25/2016  . Diarrhea 10/25/14  . Diverticulosis 10/27/2014  . Essential hypertension 09/20/2006  11/15/15 Hgb 12.8, Na 139, K 4.2, Bun 14, creat 0.68, TP 5.7, albumin 3.5, TSH 1.54, Hgb a1c 4.4   . Fall at nursing home 10/25/2014  . GERD (gastroesophageal reflux disease)   . History of cardiovascular disorder 09/20/2006   X 3 in 2000 now on aspirin.    [place] Kitchen Hyperlipidemia 09/20/2006   05/15/14 LDL 95   . Hypertension   . Lumbosacral spondylosis 10/28/2009  . OSTEOPOROSIS 09/20/2006   Noted 2008    . Overactive bladder 11/29/2013    Myrbetriq prescribed by urology.    . Protein calorie malnutrition ([place])    06/06/15 TP 6.0 albumin 2.9  . SCOLIOSIS 10/28/2009  . Scoliosis 10/27/2014  . Thrombocytosis ([place]) 05/31/2015   06/10/15 plt 602 07/23/15 plt 335 11/15/15 Hgb 12.8, Na 139, K 4.2, Bun 14, creat 0.68, TP 5.7, albumin 3.5, TSH 1.54, Hgb a1c 4.4  . TIA (transient ischemic attack) 09/20/2006   Past Surgical History:  Procedure Laterality Date  . ABDOMINAL HYSTERECTOMY    . APPENDECTOMY    . CHOLECYSTECTOMY    . TONSILLECTOMY      Allergies  Allergen Reactions  . Tramadol Other (See Comments)    Hallucinations, agitation/combativeness  . Amoxicillin     Per MAR  . Oxycodone Other (See Comments)    Per MAR   . Sulfamethoxazole     Per MAR   . Penicillins Rash    Allergies as of 06/10/2016      Reactions   Tramadol Other (See Comments)   Hallucinations, agitation/combativeness   Amoxicillin    Per MAR   Oxycodone Other (See Comments)   Per MAR    Sulfamethoxazole    Per MAR    Penicillins Rash      Medication List       Accurate as of 06/10/16 12:17 PM. Always use your most recent med list.          acetaminophen 325 MG tablet Commonly known as:  TYLENOL Take 650 mg by mouth every 4 (four) hours as needed for moderate pain or headache.   albuterol 108 (90 Base) MCG/ACT inhaler Commonly known as:  PROVENTIL HFA;VENTOLIN HFA Inhale 2 puffs into the lungs every 6 (six) hours as needed for wheezing or shortness of breath.   alum & mag hydroxide-simeth 200-200-20 MG/5ML suspension Commonly known as:  MAALOX/MYLANTA Take 30 mLs by mouth as needed for indigestion or heartburn.   amLODipine 5 MG tablet Commonly known as:  NORVASC Take 5 mg by mouth daily with breakfast.   DULoxetine 20 MG capsule Commonly known as:  CYMBALTA Take 20 mg by mouth daily.   loperamide 2 MG capsule Commonly known as:  IMODIUM Take one to two tablets by mouth as needed for diarrhea, Max 16 mg/24 hours call MD if  Diarrhea continues   losartan 100 MG tablet Commonly known as:  COZAAR Take 100 mg by mouth daily with breakfast.   MYRBETRIQ 25 MG Tb24 tablet Generic drug:  mirabegron ER Take 25 mg by mouth daily with breakfast. Prescribed by urology   naproxen sodium 220 MG tablet Commonly known as:  ANAPROX Take 220 mg by mouth 2 (two) times daily with a meal.   nitroGLYCERIN 0.4 MG SL tablet Commonly known as:  NITROSTAT Place 0.4 mg under the tongue every 5 (five) minutes as needed for chest pain. After taking three times and no relief call MD.   omeprazole 20 MG capsule Commonly known as:  PRILOSEC Take 20 mg by mouth  daily.   polyethylene glycol packet Commonly known as:  MIRALAX / GLYCOLAX Take 17 g by mouth daily.   pyridOXINE 100 MG tablet Commonly known as:  VITAMIN B-6 Take 100 mg by mouth daily with breakfast.       Review of Systems  Constitutional: Negative for chills, diaphoresis and fever.  HENT: Positive for hearing loss. Negative for congestion, ear discharge, ear pain, nosebleeds, sore throat and tinnitus.   Eyes: Negative for photophobia, pain, discharge and redness.  Respiratory: Negative for cough, shortness of breath, wheezing and stridor.   Cardiovascular: Positive for leg swelling. Negative for chest pain and palpitations.       Trace edema BLE  Gastrointestinal: Negative for abdominal pain, blood in stool, constipation, diarrhea (Under control with Questran and Imodium), nausea and vomiting.  Endocrine: Negative for polydipsia.  Genitourinary: Positive for frequency. Negative for dysuria, flank pain, hematuria and urgency.       1-2x/night. Stress incontinence  Musculoskeletal: Positive for arthralgias and gait problem. Negative for back pain, myalgias and neck pain.       Resolution of Left lower back, left hip, left thigh pain with weight bearing and position changes Chest pain was relieved after Mylanta 05/28/15 Neck pain 05/29/15  Skin: Negative.   Negative for rash.       Forearms and thighs small red spots, the patient stated it occurred about 6 months ago, mild irritation.   Allergic/Immunologic: Negative for environmental allergies.  Neurological: Negative for dizziness, tremors, seizures, weakness and headaches.       Memory loss. Patient had delirium when septic.  Hematological: Does not bruise/bleed easily.       Resolved anemia secondary to acute blood loss from lower GI bleeding. Plt count has returned to normal.  Psychiatric/Behavioral: Negative for agitation, confusion, hallucinations and suicidal ideas. The patient is nervous/anxious.     Immunization History  Administered Date(s) Administered  . HiB (PRP-OMP) 02/12/2004  . Influenza Split 01/11/2012  . Influenza Whole 01/07/2009  . Influenza-Unspecified 01/11/2014, 01/01/2015, 01/29/2016  . PPD Test 11/14/2014, 06/03/2015, 06/17/2015  . Pneumococcal Polysaccharide-23 04/28/2010  . Tdap 08/24/2011  . Zoster 04/13/2010   Pertinent  Health Maintenance Due  Topic Date Due  . DEXA SCAN  01/28/1991  . PNA vac Low Risk Adult (2 of 2 - PCV13) 04/29/2011  . INFLUENZA VACCINE  Completed   Fall Risk  06/20/2015 05/23/2015 11/09/2014 05/10/2014  Falls in the past year? No No No No   Functional Status Survey:    Vitals:   06/10/16 1108  BP: 140/62  Pulse: 76  Resp: 20  Temp: 98.4 F (36.9 C)  Weight: 154 lb 3.2 oz (69.9 kg)  Height: 5' 5.5" (1.664 m)   Body mass index is 25.27 kg/m. Physical Exam  Constitutional: She is oriented to person, place, and time. She appears well-developed and well-nourished. No distress.  HENT:  Head: Normocephalic and atraumatic.  Right Ear: No lacerations. No drainage. No foreign bodies. No mastoid tenderness. Tympanic membrane is not injected, not scarred, not perforated, not erythematous, not retracted and not bulging. Tympanic membrane mobility is abnormal. No middle ear effusion. Decreased hearing is noted.  Left Ear: No lacerations.  No drainage. No foreign bodies. No mastoid tenderness. Tympanic membrane is not injected, not scarred, not perforated, not erythematous, not retracted and not bulging. Tympanic membrane mobility is abnormal.  No middle ear effusion. Decreased hearing is noted.  Nose: Nose normal.  Mouth/Throat: Oropharynx is clear and moist. No oropharyngeal exudate.  Eyes:  Conjunctivae and EOM are normal. Pupils are equal, round, and reactive to light. Right eye exhibits no discharge. Left eye exhibits no discharge. No scleral icterus.  Neck: Normal range of motion. Neck supple. No JVD present. No tracheal deviation present. No thyromegaly present.  Cardiovascular: Normal rate, regular rhythm, normal heart sounds and intact distal pulses.  Exam reveals no gallop and no friction rub.   No murmur heard. Pulmonary/Chest: Effort normal. No stridor. No respiratory distress. She has no wheezes. She has no rales. She exhibits no tenderness.  Coarse rales posterior mid to lower lungs.  Abdominal: Soft. Bowel sounds are normal. She exhibits no distension and no mass. There is no tenderness. There is no rebound and no guarding.  Genitourinary: Rectal exam shows guaiac negative stool.  Musculoskeletal: She exhibits edema and tenderness.  Resolved Left lower back, left hip, left thigh pain with weight bearing and position changes Chest pain was relieved after Mylanta 05/28/15 Neck pain 05/29/15 BLE trace edema  Lymphadenopathy:    She has no cervical adenopathy.  Neurological: She is alert and oriented to person, place, and time. She has normal reflexes. No cranial nerve deficit. She exhibits normal muscle tone. Coordination normal.  Skin: Skin is warm and dry. No rash noted. She is not diaphoretic. No erythema. No pallor.  Lots of moles. Red spots arms and legs.   Psychiatric: Her behavior is normal. Thought content normal.    Labs reviewed:  Recent Labs  11/14/15 05/14/16  NA 139 139  K 4.2 4.5  BUN 14 14    CREATININE 0.7 0.7    Recent Labs  11/14/15 05/14/16  AST 17 19  ALT 15 17  ALKPHOS 67 64    Recent Labs  07/23/15 11/14/15 05/14/16  WBC 6.1 6.0 6.6  HGB 14.1 12.8 13.0  HCT 42 38 38  PLT 335 317 371   Lab Results  Component Value Date   TSH 1.42 05/14/2016   Lab Results  Component Value Date   HGBA1C 4.4 11/14/2015   Lab Results  Component Value Date   CHOL 176 05/15/2014   HDL 62 05/15/2014   LDLCALC 95 05/15/2014   LDLDIRECT 114.3 12/08/2007   TRIG 93 05/15/2014   CHOLHDL 2.9 CALC 12/08/2007    Significant Diagnostic Results in last 30 days:  No results found.  Assessment/Plan Essential hypertension Controlled, continue Amlodipine 5mg , Losartan 100mg , 05/14/16 wbc 6.6, Hgb 13.0, plt 371, na 139, K 4.5, Bun 14, creat 0.73, TSH 1.42   GERD (gastroesophageal reflux disease) Stable, continue prn Mylanta, Omeprazole 20mg   Overactive bladder Saw Urology 06/06/14-continued with Myrbetriq 25mg .  Stress incontinent in natures. 3-4x/night    Chronic lower back pain Okay since 04/08/16 pharm Aleve 220mg  bid   Adjustment disorder with mixed anxiety and depressed mood In setting of migratory aches and pains, stabilized, continue Cymbalta 20mg  daily, observe the patient.      Family/ staff Communication: AL   Labs/tests ordered:  none

## 2016-06-24 ENCOUNTER — Encounter: Payer: Self-pay | Admitting: *Deleted

## 2016-07-16 ENCOUNTER — Non-Acute Institutional Stay: Payer: Medicare Other | Admitting: Internal Medicine

## 2016-07-16 ENCOUNTER — Encounter: Payer: Self-pay | Admitting: Internal Medicine

## 2016-07-16 DIAGNOSIS — G8929 Other chronic pain: Secondary | ICD-10-CM

## 2016-07-16 DIAGNOSIS — D473 Essential (hemorrhagic) thrombocythemia: Secondary | ICD-10-CM | POA: Diagnosis not present

## 2016-07-16 DIAGNOSIS — M5440 Lumbago with sciatica, unspecified side: Secondary | ICD-10-CM | POA: Diagnosis not present

## 2016-07-16 DIAGNOSIS — L309 Dermatitis, unspecified: Secondary | ICD-10-CM

## 2016-07-16 DIAGNOSIS — F4323 Adjustment disorder with mixed anxiety and depressed mood: Secondary | ICD-10-CM | POA: Diagnosis not present

## 2016-07-16 DIAGNOSIS — D75839 Thrombocytosis, unspecified: Secondary | ICD-10-CM

## 2016-07-16 DIAGNOSIS — I10 Essential (primary) hypertension: Secondary | ICD-10-CM

## 2016-07-16 NOTE — Progress Notes (Signed)
Progress Note    Location:  [facility] Room Number: [phone] Place of Service:  ALF [phone]) Provider: Jeanmarie Hubert, MD  Patient Care Team: Estill Dooms, MD as PCP - General (Internal Medicine) Man Otho Darner, NP as Nurse Practitioner (Nurse Practitioner)  Extended Emergency Contact Information Primary Emergency Contact: [name] Address: [place], [place] [address] [name] of [place] Phone: [phone] Work Phone: [phone] Mobile Phone: [phone] Relation: Daughter Secondary Emergency Contact: [name] Address: [address] [place] [place] [address] [name] of [place] Phone: [phone] Mobile Phone: [phone] Relation: Son  Code Status: DNR Goals of care: Advanced Directive information Advanced Directives 07/16/2016  Does Patient Have a Medical Advance Directive? Yes  Type of Paramedic of [place];Living will;Out of facility DNR (pink MOST or yellow form)  Does patient want to make changes to medical advance directive? -  Copy of [place] in Chart? Yes  Pre-existing out of facility DNR order (yellow form or pink MOST form) Yellow form placed in chart (order not valid for inpatient use)     Chief Complaint  Patient presents with  . Medical Management of Chronic Issues    routine visit    HPI:  Pt is a 83 y.o. female seen today for medical management of chronic diseases.    Essential hypertension - controlled  Thrombocytosis (HCC) - last plt count normal  Dermatitis - persistent folliculitis of the anterior thighs. Denies pain or itchiing.  Chronic low back pain with sciatica, sciatica laterality unspecified, unspecified back pain laterality  Adjustment disorder with mixed anxiety and depressed mood - improved on Cymbalta     Past Medical History:  Diagnosis Date  . Abnormality of gait 04/28/2010   History of  fall onto right shoulder-has participated in PT. Uses walker.     . Acute encephalopathy 10/25/14   Occurred when septic  . Acute lower GI bleeding 10/30/2014  . Adjustment disorder with mixed anxiety and depressed mood 06/04/2015   11/15/15 Hgb 12.8, Na 139, K 4.2, Bun 14, creat 0.68, TP 5.7, albumin 3.5, TSH 1.54, Hgb a1c 4.4   . Anemia   . Aortic atherosclerosis ([place]) 10/27/2014  . Asthma   . Cerebral atrophy 10/27/2014  . Cerebrovascular disease 10/27/2014   Small vessel disease  . Cervical disc disease   . Chronic lower back pain 06/04/2015   lumbar spondylosis. Regular f/u Dr. French Ana. Receives injections last 01/2013.  05/27/15 Xray left hip/pelvis: spondylotic changes, severe osteoarthritis Continue Tylenol and Tramadol prn for pain, observe the patient, adding Cymbalta may help. Mid to lower back pain more on the right side back, may consider X-ray of thoracic spine. 06/01/15 CT thoracic spine showed no acute changes.  Patient received 10 injection in the back through Dr. Alvester Morin office. She says the previous problems of discomfort in the back and down the leg have completely resolved. Takes Aleve 220mg  bid   . Debility 06/20/2015   11/15/15 Hgb 12.8, Na 139, K 4.2, Bun 14, creat 0.68, TP 5.7, albumin 3.5, TSH 1.54, Hgb a1c 4.4   . Degenerative disc disease, lumbar 10/27/2014  . Depression   . Dermatitis 03/25/2016  . Diarrhea 10/25/14  . Diverticulosis 10/27/2014  . Essential hypertension 09/20/2006   11/15/15 Hgb 12.8, Na 139, K 4.2, Bun 14, creat 0.68, TP 5.7, albumin 3.5, TSH  1.54, Hgb a1c 4.4   . Fall at nursing home 10/25/2014  . GERD (gastroesophageal reflux disease)   . History of cardiovascular disorder 09/20/2006   X 3 in 2000 now on aspirin.    Marland Kitchen Hyperlipidemia 09/20/2006   05/15/14 LDL 95   . Hypertension   . Lumbosacral spondylosis 10/28/2009  . OSTEOPOROSIS 09/20/2006   Noted 2008    . Overactive bladder 11/29/2013   Myrbetriq prescribed by urology.    . Protein calorie malnutrition  ([place])    06/06/15 TP 6.0 albumin 2.9  . SCOLIOSIS 10/28/2009  . Scoliosis 10/27/2014  . Thrombocytosis ([place]) 05/31/2015   06/10/15 plt 602 07/23/15 plt 335 11/15/15 Hgb 12.8, Na 139, K 4.2, Bun 14, creat 0.68, TP 5.7, albumin 3.5, TSH 1.54, Hgb a1c 4.4  . TIA (transient ischemic attack) 09/20/2006   Past Surgical History:  Procedure Laterality Date  . ABDOMINAL HYSTERECTOMY    . APPENDECTOMY    . CHOLECYSTECTOMY    . TONSILLECTOMY      Allergies  Allergen Reactions  . Tramadol Other (See Comments)    Hallucinations, agitation/combativeness  . Amoxicillin     Per MAR  . Oxycodone Other (See Comments)    Per MAR   . Sulfamethoxazole     Per MAR   . Penicillins Rash    Allergies as of 07/16/2016      Reactions   Tramadol Other (See Comments)   Hallucinations, agitation/combativeness   Amoxicillin    Per MAR   Oxycodone Other (See Comments)   Per MAR    Sulfamethoxazole    Per MAR    Penicillins Rash      Medication List       Accurate as of 07/16/16 11:15 AM. Always use your most recent med list.          acetaminophen 325 MG tablet Commonly known as:  TYLENOL Take 650 mg by mouth every 4 (four) hours as needed for moderate pain or headache.   albuterol 108 (90 Base) MCG/ACT inhaler Commonly known as:  PROVENTIL HFA;VENTOLIN HFA Inhale 2 puffs into the lungs every 6 (six) hours as needed for wheezing or shortness of breath.   alum & mag hydroxide-simeth 200-200-20 MG/5ML suspension Commonly known as:  MAALOX/MYLANTA Take 30 mLs by mouth as needed for indigestion or heartburn.   amLODipine 5 MG tablet Commonly known as:  NORVASC Take 5 mg by mouth daily with breakfast.   CETAPHIL MOISTURIZING EX Apply topically. Apply cream once a day   clobetasol cream 0.05 % Commonly known as:  TEMOVATE Apply 1 application topically. Rub on affected area twice a day.   May self administer and keep at at bedside   DULoxetine 20 MG capsule Commonly known as:  CYMBALTA Take 20  mg by mouth daily.   loperamide 2 MG capsule Commonly known as:  IMODIUM Take one to two tablets by mouth as needed for diarrhea, Max 16 mg/24 hours call MD if Diarrhea continues   losartan 100 MG tablet Commonly known as:  COZAAR Take 100 mg by mouth daily with breakfast.   MYRBETRIQ 25 MG Tb24 tablet Generic drug:  mirabegron ER Take 25 mg by mouth daily with breakfast. Prescribed by urology   naproxen sodium 220 MG tablet Commonly known as:  ANAPROX Take 220 mg by mouth 2 (two) times daily with a meal.   nitroGLYCERIN 0.4 MG SL tablet Commonly known as:  NITROSTAT Place 0.4 mg under the tongue every 5 (five) minutes as needed  for chest pain. After taking three times and no relief call MD.   omeprazole 20 MG capsule Commonly known as:  PRILOSEC Take 20 mg by mouth daily.   polyethylene glycol packet Commonly known as:  MIRALAX / GLYCOLAX Take 17 g by mouth daily.   pyridOXINE 100 MG tablet Commonly known as:  VITAMIN B-6 Take 100 mg by mouth daily with breakfast.       Review of Systems  Constitutional: Negative for chills, diaphoresis and fever.  HENT: Positive for hearing loss. Negative for congestion, ear discharge, ear pain, nosebleeds, sore throat and tinnitus.   Eyes: Negative for photophobia, pain, discharge and redness.  Respiratory: Negative for cough, shortness of breath, wheezing and stridor.   Cardiovascular: Positive for leg swelling. Negative for chest pain and palpitations.       Trace edema BLE  Gastrointestinal: Negative for abdominal pain, blood in stool, constipation, diarrhea (Under control with Questran and Imodium), nausea and vomiting.  Endocrine: Negative for polydipsia.  Genitourinary: Positive for frequency. Negative for dysuria, flank pain, hematuria and urgency.       1-2x/night. Stress incontinence  Musculoskeletal: Positive for arthralgias and gait problem. Negative for back pain, myalgias and neck pain.       Resolution of Left lower  back, left hip, left thigh pain with weight bearing and position changes Chest pain was relieved after Mylanta 05/28/15 Neck pain 05/29/15  Skin: Negative for rash.       Thighs have small red spots present since summer 2017.  Allergic/Immunologic: Negative for environmental allergies.  Neurological: Negative for dizziness, tremors, seizures, weakness and headaches.       Memory loss. Patient had delirium when septic.  Hematological: Does not bruise/bleed easily.       Resolved anemia secondary to acute blood loss from lower GI bleeding. Plt count has returned to normal.  Psychiatric/Behavioral: Negative for agitation, confusion, hallucinations and suicidal ideas. Dysphoric mood: resolved. The patient is nervous/anxious.     Immunization History  Administered Date(s) Administered  . HiB (PRP-OMP) 02/12/2004  . Influenza Split 01/11/2012  . Influenza Whole 01/07/2009  . Influenza-Unspecified 01/11/2014, 01/01/2015, 01/29/2016  . PPD Test 11/14/2014, 06/03/2015, 06/17/2015  . Pneumococcal Polysaccharide-23 04/28/2010  . Tdap 08/24/2011  . Zoster 04/13/2010   Pertinent  Health Maintenance Due  Topic Date Due  . DEXA SCAN  01/28/1991  . PNA vac Low Risk Adult (2 of 2 - PCV13) 04/29/2011  . INFLUENZA VACCINE  11/11/2016   Fall Risk  06/20/2015 05/23/2015 11/09/2014 05/10/2014  Falls in the past year? No No No No     Vitals:   07/16/16 1105  BP: 136/74  Pulse: 72  Resp: 18  Temp: 98.3 F (36.8 C)  Weight: 155 lb 6.4 oz (70.5 kg)  Height: 5\' 5"  (1.651 m)   Body mass index is 25.86 kg/m.  Wt Readings from Last 3 Encounters:  07/16/16 155 lb 6.4 oz (70.5 kg)  06/10/16 154 lb 3.2 oz (69.9 kg)  05/13/16 154 lb 3.2 oz (69.9 kg)     Physical Exam  Constitutional: She is oriented to person, place, and time. She appears well-developed and well-nourished. No distress.  HENT:  Head: Normocephalic and atraumatic.  Right Ear: No lacerations. No drainage. No foreign bodies. No mastoid  tenderness. Tympanic membrane is not injected, not scarred, not perforated, not erythematous, not retracted and not bulging. Tympanic membrane mobility is abnormal. No middle ear effusion. Decreased hearing is noted.  Left Ear: No lacerations. No drainage. No  foreign bodies. No mastoid tenderness. Tympanic membrane is not injected, not scarred, not perforated, not erythematous, not retracted and not bulging. Tympanic membrane mobility is abnormal.  No middle ear effusion. Decreased hearing is noted.  Nose: Nose normal.  Mouth/Throat: Oropharynx is clear and moist. No oropharyngeal exudate.  Eyes: Conjunctivae and EOM are normal. Pupils are equal, round, and reactive to light. Right eye exhibits no discharge. Left eye exhibits no discharge. No scleral icterus.  Neck: Normal range of motion. Neck supple. No JVD present. No tracheal deviation present. No thyromegaly present.  Cardiovascular: Normal rate, regular rhythm, normal heart sounds and intact distal pulses.  Exam reveals no gallop and no friction rub.   No murmur heard. Pulmonary/Chest: Effort normal. No stridor. No respiratory distress. She has no wheezes. She has no rales. She exhibits no tenderness.  Coarse rales posterior mid to lower lungs.  Abdominal: Soft. Bowel sounds are normal. She exhibits no distension and no mass. There is no tenderness. There is no rebound and no guarding.  Genitourinary: Rectal exam shows guaiac negative stool.  Musculoskeletal: She exhibits edema and tenderness.  Resolved Left lower back, left hip, left thigh pain with weight bearing and position changes Chest pain was relieved after Mylanta 05/28/15 Neck pain 05/29/15 BLE trace edema  Lymphadenopathy:    She has no cervical adenopathy.  Neurological: She is alert and oriented to person, place, and time. She has normal reflexes. No cranial nerve deficit. She exhibits normal muscle tone. Coordination normal.  Skin: Skin is warm and dry. No rash noted. She is not  diaphoretic. No erythema. No pallor.  Lots of moles. Red spots arms and legs.   Psychiatric: Her behavior is normal. Thought content normal.    Labs reviewed:  Recent Labs  11/14/15 05/14/16  NA 139 139  K 4.2 4.5  BUN 14 14  CREATININE 0.7 0.7    Recent Labs  11/14/15 05/14/16  AST 17 19  ALT 15 17  ALKPHOS 67 64    Recent Labs  07/23/15 11/14/15 05/14/16  WBC 6.1 6.0 6.6  HGB 14.1 12.8 13.0  HCT 42 38 38  PLT 335 317 371   Lab Results  Component Value Date   TSH 1.42 05/14/2016   Lab Results  Component Value Date   HGBA1C 4.4 11/14/2015   Lab Results  Component Value Date   CHOL 176 05/15/2014   HDL 62 05/15/2014   LDLCALC 95 05/15/2014   LDLDIRECT 114.3 12/08/2007   TRIG 93 05/15/2014   CHOLHDL 2.9 CALC 12/08/2007    Assessment/Plan 1. Essential hypertension The current medical regimen is effective;  continue present plan and medications.  2. Thrombocytosis ([place]) resolved  3. Dermatitis I think this is a persistent follicullitis. Continue clobetasol and Cetaphil  4. Chronic low back pain with sciatica, sciatica laterality unspecified, unspecified back pain laterality resolved  5. Adjustment disorder with mixed anxiety and depressed mood Better on Cymbalta

## 2016-07-30 ENCOUNTER — Non-Acute Institutional Stay: Payer: Medicare Other | Admitting: Nurse Practitioner

## 2016-07-30 ENCOUNTER — Encounter: Payer: Self-pay | Admitting: Nurse Practitioner

## 2016-07-30 DIAGNOSIS — F4323 Adjustment disorder with mixed anxiety and depressed mood: Secondary | ICD-10-CM

## 2016-07-30 DIAGNOSIS — J209 Acute bronchitis, unspecified: Secondary | ICD-10-CM

## 2016-07-30 DIAGNOSIS — J440 Chronic obstructive pulmonary disease with acute lower respiratory infection: Secondary | ICD-10-CM | POA: Diagnosis not present

## 2016-07-30 DIAGNOSIS — K219 Gastro-esophageal reflux disease without esophagitis: Secondary | ICD-10-CM | POA: Diagnosis not present

## 2016-07-30 DIAGNOSIS — R05 Cough: Secondary | ICD-10-CM | POA: Diagnosis not present

## 2016-07-30 DIAGNOSIS — I10 Essential (primary) hypertension: Secondary | ICD-10-CM | POA: Diagnosis not present

## 2016-07-30 DIAGNOSIS — N3281 Overactive bladder: Secondary | ICD-10-CM | POA: Diagnosis not present

## 2016-07-30 NOTE — Assessment & Plan Note (Signed)
Stable, continue prn Mylanta, Omeprazole 20mg 

## 2016-07-30 NOTE — Progress Notes (Signed)
Location:  [facility] Room Number: [place] of Service:  ALF [phone]) Provider:  [place] Stegemann, Manxie  NP  Jeanmarie Hubert, MD  Patient Care Team: Estill Dooms, MD as PCP - General (Internal Medicine) Amethyst Gainer Otho Darner, NP as Nurse Practitioner (Nurse Practitioner)  Extended Emergency Contact Information Primary Emergency Contact: [name] Address: [place], [place] [address] [name] of [place] Phone: [phone] Work Phone: [phone] Mobile Phone: [phone] Relation: Daughter Secondary Emergency Contact: [name] Address: [address] [place] [address] [name] of [place] Phone: [phone] Mobile Phone: [phone] Relation: Son  Code Status:  DNR Goals of care: Advanced Directive information Advanced Directives 07/30/2016  Does Patient Have a Medical Advance Directive? Yes  Type of Paramedic of [place];Living will;Out of facility DNR (pink MOST or yellow form)  Does patient want to make changes to medical advance directive? No - Patient declined  Copy of [place] in Chart? Yes  Pre-existing out of facility DNR order (yellow form or pink MOST form) Yellow form placed in chart (order not valid for inpatient use)     Chief Complaint  Patient presents with  . Acute Visit    ? bronchitis --> coughing x 1 with blood seen per patient.    HPI:  Pt is a 83 y.o. female seen today for an acute visit for productive cough x 1 week, cough up blood x1, c/o SOB, denied chest pain, palpitation, she is afebrile, no O2 desaturation. diffused expiratory wheezes R+L lungs.   Hx of back and hip pain, She takes Naproxen 220mg  bid for back and hip pain.Patient received 10 injection in the back through Dr. Alvester Morin office. She says the previous problems of discomfort in the back and down the leg have completely resolved. Blood pressure is controlled  Amlodipine 5mg , Losartan 100mg . Urinary frequent, managed with Myrbetriq 25mg , mood is stable Cymbalta 20mg . OffIron for anemia. Last Hgb 13.0 05/14/16  Past Medical History:  Diagnosis Date  . Abnormality of gait 04/28/2010   History of fall onto right shoulder-has participated in PT. Uses walker.     . Acute encephalopathy 10/25/14   Occurred when septic  . Acute lower GI bleeding 10/30/2014  . Adjustment disorder with mixed anxiety and depressed mood 06/04/2015   11/15/15 Hgb 12.8, Na 139, K 4.2, Bun 14, creat 0.68, TP 5.7, albumin 3.5, TSH 1.54, Hgb a1c 4.4   . Anemia   . Aortic atherosclerosis ([place]) 10/27/2014  . Asthma   . Cerebral atrophy 10/27/2014  . Cerebrovascular disease 10/27/2014   Small vessel disease  . Cervical disc disease   . Chronic lower back pain 06/04/2015   lumbar spondylosis. Regular f/u Dr. French Ana. Receives injections last 01/2013.  05/27/15 Xray left hip/pelvis: spondylotic changes, severe osteoarthritis Continue Tylenol and Tramadol prn for pain, observe the patient, adding Cymbalta may help. Mid to lower back pain more on the right side back, may consider X-ray of thoracic spine. 06/01/15 CT thoracic spine showed no acute changes.  Patient received 10 injection in the back through Dr. Alvester Morin office. She says the previous problems of discomfort in the back and down the leg have completely resolved. Takes Aleve 220mg  bid   . Debility 06/20/2015   11/15/15 Hgb 12.8, Na 139, K 4.2, Bun 14, creat 0.68, TP 5.7, albumin 3.5, TSH 1.54, Hgb  a1c 4.4   . Degenerative disc disease, lumbar 10/27/2014  . Depression   . Dermatitis 03/25/2016  . Diarrhea 10/25/14  . Diverticulosis 10/27/2014  . Essential hypertension 09/20/2006   11/15/15 Hgb 12.8, Na 139, K 4.2, Bun 14, creat 0.68, TP 5.7, albumin 3.5, TSH 1.54, Hgb a1c 4.4   . Fall at nursing home 10/25/2014  . GERD (gastroesophageal reflux disease)   . History of cardiovascular disorder 09/20/2006   X 3 in 2000 now on aspirin.    Marland Kitchen  Hyperlipidemia 09/20/2006   05/15/14 LDL 95   . Hypertension   . Lumbosacral spondylosis 10/28/2009  . OSTEOPOROSIS 09/20/2006   Noted 2008    . Overactive bladder 11/29/2013   Myrbetriq prescribed by urology.    . Protein calorie malnutrition ([place])    06/06/15 TP 6.0 albumin 2.9  . SCOLIOSIS 10/28/2009  . Scoliosis 10/27/2014  . Thrombocytosis ([place]) 05/31/2015   06/10/15 plt 602 07/23/15 plt 335 11/15/15 Hgb 12.8, Na 139, K 4.2, Bun 14, creat 0.68, TP 5.7, albumin 3.5, TSH 1.54, Hgb a1c 4.4  . TIA (transient ischemic attack) 09/20/2006   Past Surgical History:  Procedure Laterality Date  . ABDOMINAL HYSTERECTOMY    . APPENDECTOMY    . CHOLECYSTECTOMY    . TONSILLECTOMY      Allergies  Allergen Reactions  . Tramadol Other (See Comments)    Hallucinations, agitation/combativeness  . Amoxicillin     Per MAR  . Oxycodone Other (See Comments)    Per MAR   . Sulfamethoxazole     Per MAR   . Penicillins Rash    Outpatient Encounter Prescriptions as of 07/30/2016  Medication Sig  . acetaminophen (TYLENOL) 325 MG tablet Take 650 mg by mouth every 4 (four) hours as needed for moderate pain or headache.  . albuterol (PROVENTIL HFA;VENTOLIN HFA) 108 (90 BASE) MCG/ACT inhaler Inhale 2 puffs into the lungs every 6 (six) hours as needed for wheezing or shortness of breath.  Marland Kitchen alum & mag hydroxide-simeth (MAALOX/MYLANTA) 200-200-20 MG/5ML suspension Take 30 mLs by mouth as needed for indigestion or heartburn.  Marland Kitchen amLODipine (NORVASC) 5 MG tablet Take 5 mg by mouth daily with breakfast.   . clobetasol cream (TEMOVATE) 3.79 % Apply 1 application topically. Rub on affected area twice a day.   May self administer and keep at at bedside  . DULoxetine (CYMBALTA) 20 MG capsule Take 20 mg by mouth daily.  . Emollient (CETAPHIL MOISTURIZING EX) Apply topically. Apply cream once a day  . loperamide (IMODIUM) 2 MG capsule Take one to two tablets by mouth as needed for diarrhea, Max 16 mg/24 hours call MD if  Diarrhea continues  . losartan (COZAAR) 100 MG tablet Take 100 mg by mouth daily with breakfast.   . Mirabegron ER (MYRBETRIQ) 25 MG TB24 Take 25 mg by mouth daily with breakfast. Prescribed by urology  . naproxen sodium (ANAPROX) 220 MG tablet Take 220 mg by mouth 2 (two) times daily with a meal.  . nitroGLYCERIN (NITROSTAT) 0.4 MG SL tablet Place 0.4 mg under the tongue every 5 (five) minutes as needed for chest pain. After taking three times and no relief call MD.  . omeprazole (PRILOSEC) 20 MG capsule Take 20 mg by mouth daily.  . polyethylene glycol (MIRALAX / GLYCOLAX) packet Take 17 g by mouth daily.  Marland Kitchen pyridOXINE (VITAMIN B-6) 100 MG tablet Take 100 mg by mouth daily with breakfast.    Facility-Administered Encounter Medications as of 07/30/2016  Medication  .  0.9 %  sodium chloride infusion    Review of Systems  Constitutional: Negative for chills, diaphoresis and fever.  HENT: Positive for hearing loss. Negative for congestion, ear discharge, ear pain, nosebleeds, sore throat and tinnitus.   Eyes: Negative for photophobia, pain, discharge and redness.  Respiratory: Negative for cough, shortness of breath, wheezing and stridor.   Cardiovascular: Positive for leg swelling. Negative for chest pain and palpitations.       Trace edema BLE  Gastrointestinal: Negative for abdominal pain, blood in stool, constipation, diarrhea (Under control with Questran and Imodium), nausea and vomiting.  Endocrine: Negative for polydipsia.  Genitourinary: Positive for frequency. Negative for dysuria, flank pain, hematuria and urgency.       1-2x/night. Stress incontinence  Musculoskeletal: Positive for arthralgias and gait problem. Negative for back pain, myalgias and neck pain.       Resolution of Left lower back, left hip, left thigh pain with weight bearing and position changes Chest pain was relieved after Mylanta 05/28/15 Neck pain 05/29/15  Skin: Negative for rash.       Thighs have small red  spots present since summer 2017.  Allergic/Immunologic: Negative for environmental allergies.  Neurological: Negative for dizziness, tremors, seizures, weakness and headaches.       Memory loss. Patient had delirium when septic.  Hematological: Does not bruise/bleed easily.       Resolved anemia secondary to acute blood loss from lower GI bleeding. Plt count has returned to normal.  Psychiatric/Behavioral: Negative for agitation, confusion, hallucinations and suicidal ideas. Dysphoric mood: resolved. The patient is nervous/anxious.     Immunization History  Administered Date(s) Administered  . HiB (PRP-OMP) 02/12/2004  . Influenza Split 01/11/2012  . Influenza Whole 01/07/2009  . Influenza-Unspecified 01/11/2014, 01/01/2015, 01/29/2016  . PPD Test 11/14/2014, 06/03/2015, 06/17/2015  . Pneumococcal Polysaccharide-23 04/28/2010  . Tdap 08/24/2011  . Zoster 04/13/2010   Pertinent  Health Maintenance Due  Topic Date Due  . DEXA SCAN  01/28/1991  . PNA vac Low Risk Adult (2 of 2 - PCV13) 04/29/2011  . INFLUENZA VACCINE  11/11/2016   Fall Risk  06/20/2015 05/23/2015 11/09/2014 05/10/2014  Falls in the past year? No No No No   Functional Status Survey:    Vitals:   07/30/16 1002  BP: (!) 156/72  Pulse: 88  Resp: (!) 28  Temp: 98.1 F (36.7 C)  SpO2: 92%  Weight: 155 lb (70.3 kg)  Height: 5\' 5"  (1.651 m)   Body mass index is 25.79 kg/m. Physical Exam  Constitutional: She is oriented to person, place, and time. She appears well-developed and well-nourished. No distress.  HENT:  Head: Normocephalic and atraumatic.  Right Ear: No lacerations. No drainage. No foreign bodies. No mastoid tenderness. Tympanic membrane is not injected, not scarred, not perforated, not erythematous, not retracted and not bulging. Tympanic membrane mobility is abnormal. No middle ear effusion. Decreased hearing is noted.  Left Ear: No lacerations. No drainage. No foreign bodies. No mastoid tenderness.  Tympanic membrane is not injected, not scarred, not perforated, not erythematous, not retracted and not bulging. Tympanic membrane mobility is abnormal.  No middle ear effusion. Decreased hearing is noted.  Nose: Nose normal.  Mouth/Throat: Oropharynx is clear and moist. No oropharyngeal exudate.  Eyes: Conjunctivae and EOM are normal. Pupils are equal, round, and reactive to light. Right eye exhibits no discharge. Left eye exhibits no discharge. No scleral icterus.  Neck: Normal range of motion. Neck supple. No JVD present. No tracheal deviation present.  No thyromegaly present.  Cardiovascular: Normal rate, regular rhythm, normal heart sounds and intact distal pulses.  Exam reveals no gallop and no friction rub.   No murmur heard. Pulmonary/Chest: Effort normal. No stridor. No respiratory distress. She has no wheezes. She has no rales. She exhibits no tenderness.  Coarse rales posterior mid to lower lungs.  Abdominal: Soft. Bowel sounds are normal. She exhibits no distension and no mass. There is no tenderness. There is no rebound and no guarding.  Genitourinary: Rectal exam shows guaiac negative stool.  Musculoskeletal: She exhibits edema and tenderness.  Resolved Left lower back, left hip, left thigh pain with weight bearing and position changes Chest pain was relieved after Mylanta 05/28/15 Neck pain 05/29/15 BLE trace edema  Lymphadenopathy:    She has no cervical adenopathy.  Neurological: She is alert and oriented to person, place, and time. She has normal reflexes. No cranial nerve deficit. She exhibits normal muscle tone. Coordination normal.  Skin: Skin is warm and dry. No rash noted. She is not diaphoretic. No erythema. No pallor.  Lots of moles. Red spots arms and legs.   Psychiatric: Her behavior is normal. Thought content normal.    Labs reviewed:  Recent Labs  11/14/15 05/14/16  NA 139 139  K 4.2 4.5  BUN 14 14  CREATININE 0.7 0.7    Recent Labs  11/14/15 05/14/16    AST 17 19  ALT 15 17  ALKPHOS 67 64    Recent Labs  11/14/15 05/14/16  WBC 6.0 6.6  HGB 12.8 13.0  HCT 38 38  PLT 317 371   Lab Results  Component Value Date   TSH 1.42 05/14/2016   Lab Results  Component Value Date   HGBA1C 4.4 11/14/2015   Lab Results  Component Value Date   CHOL 176 05/15/2014   HDL 62 05/15/2014   LDLCALC 95 05/15/2014   LDLDIRECT 114.3 12/08/2007   TRIG 93 05/15/2014   CHOLHDL 2.9 CALC 12/08/2007    Significant Diagnostic Results in last 30 days:  No results found.  Assessment/Plan Acute bronchitis with COPD ([place]) CXR to r/o PNA, start Levaquin 500mg  po qd x 7days,, DuoNeb q8h x 3 days, Mucinex 600mg  bid x 5 days, Medrol dose pk. Observe the patient. Update CBC CMP  GERD (gastroesophageal reflux disease) Stable, continue prn Mylanta, Omeprazole 20mg   Overactive bladder Saw Urology 06/06/14-continued with Myrbetriq 25mg .  Stress incontinent in natures. 3-4x/night    Adjustment disorder with mixed anxiety and depressed mood In setting of migratory aches and pains, stabilized, continue Cymbalta 20mg  daily, observe the patient.      Family/ staff Communication: AL  Labs/tests ordered:  CXR, CBC CMP

## 2016-07-30 NOTE — Assessment & Plan Note (Signed)
In setting of migratory aches and pains, stabilized, continue Cymbalta 20mg  daily, observe the patient.

## 2016-07-30 NOTE — Assessment & Plan Note (Signed)
Saw Urology 06/06/14-continued with Myrbetriq 25mg .  Stress incontinent in natures. 3-4x/night

## 2016-07-30 NOTE — Assessment & Plan Note (Addendum)
CXR to r/o PNA, start Levaquin 500mg  po qd x 7days,, DuoNeb q8h x 3 days, Mucinex 600mg  bid x 5 days, Medrol dose pk. Observe the patient. Update CBC CMP

## 2016-08-04 DIAGNOSIS — E162 Hypoglycemia, unspecified: Secondary | ICD-10-CM | POA: Diagnosis not present

## 2016-08-04 DIAGNOSIS — D62 Acute posthemorrhagic anemia: Secondary | ICD-10-CM | POA: Diagnosis not present

## 2016-08-04 LAB — CBC AND DIFFERENTIAL
HEMATOCRIT: 39 % (ref 36–46)
HEMOGLOBIN: 13.6 g/dL (ref 12.0–16.0)
PLATELETS: 416 10*3/uL — AB (ref 150–399)
WBC: 8 10*3/mL

## 2016-08-05 ENCOUNTER — Other Ambulatory Visit: Payer: Self-pay | Admitting: *Deleted

## 2016-08-06 ENCOUNTER — Other Ambulatory Visit: Payer: Self-pay | Admitting: *Deleted

## 2016-08-06 DIAGNOSIS — I10 Essential (primary) hypertension: Secondary | ICD-10-CM | POA: Diagnosis not present

## 2016-08-06 DIAGNOSIS — E871 Hypo-osmolality and hyponatremia: Secondary | ICD-10-CM | POA: Diagnosis not present

## 2016-08-31 ENCOUNTER — Encounter: Payer: Self-pay | Admitting: Nurse Practitioner

## 2016-08-31 ENCOUNTER — Non-Acute Institutional Stay: Payer: Medicare Other | Admitting: Nurse Practitioner

## 2016-08-31 DIAGNOSIS — I10 Essential (primary) hypertension: Secondary | ICD-10-CM

## 2016-08-31 DIAGNOSIS — N3281 Overactive bladder: Secondary | ICD-10-CM | POA: Diagnosis not present

## 2016-08-31 DIAGNOSIS — L309 Dermatitis, unspecified: Secondary | ICD-10-CM

## 2016-08-31 DIAGNOSIS — F4323 Adjustment disorder with mixed anxiety and depressed mood: Secondary | ICD-10-CM

## 2016-08-31 DIAGNOSIS — K219 Gastro-esophageal reflux disease without esophagitis: Secondary | ICD-10-CM | POA: Diagnosis not present

## 2016-08-31 NOTE — Assessment & Plan Note (Signed)
Stable, continue prn Mylanta, Omeprazole 20mg 

## 2016-08-31 NOTE — Assessment & Plan Note (Signed)
Controlled, continue Amlodipine 5mg , Losartan 100mg 

## 2016-08-31 NOTE — Assessment & Plan Note (Signed)
The left thigh skin eruptions, denied itching or pain, a group of folliculitis appearance skin eruptions, not responding to Clobetasol and Nystatin.  08/31/16 trail of Bactroban oint bid, f/u Dermatology.

## 2016-08-31 NOTE — Progress Notes (Signed)
Location:  [facility] Room Number: 188 Place of Service:  ALF [phone]) Provider:  [name], Manxie  NP  Estill Dooms, MD  Patient Care Team: Estill Dooms, MD as PCP - General (Internal Medicine) [name], [name], NP as Nurse Practitioner (Nurse Practitioner)  Extended Emergency Contact Information Primary Emergency Contact: [name] Address: [place], [place] [address] [name] of [place] Phone: [phone] Work Phone: [phone] Mobile Phone: [phone] Relation: Daughter Secondary Emergency Contact: [name] Address: [address] [place] [address] [name] of [place] Phone: [phone] Mobile Phone: [phone] Relation: Son  Code Status:  DNR Goals of care: Advanced Directive information Advanced Directives 08/31/2016  Does Patient Have a Medical Advance Directive? Yes  Type of Paramedic of [place];Living will;Out of facility DNR (pink MOST or yellow form)  Does patient want to make changes to medical advance directive? No - Patient declined  Copy of [place] in Chart? Yes  Pre-existing out of facility DNR order (yellow form or pink MOST form) Yellow form placed in chart (order not valid for inpatient use)     Chief Complaint  Patient presents with  . Acute Visit    ? shingles    HPI:  Pt is a 83 y.o. female seen today for an acute visit for    Past Medical History:  Diagnosis Date  . Abnormality of gait 04/28/2010   History of fall onto right shoulder-has participated in PT. Uses walker.     . Acute encephalopathy 10/25/14   Occurred when septic  . Acute lower GI bleeding 10/30/2014  . Adjustment disorder with mixed anxiety and depressed mood 06/04/2015   11/15/15 Hgb 12.8, Na 139, K 4.2, Bun 14, creat 0.68, TP 5.7, albumin 3.5, TSH 1.54, Hgb a1c 4.4   . Anemia   . Aortic atherosclerosis ([place]) 10/27/2014  .  Asthma   . Cerebral atrophy 10/27/2014  . Cerebrovascular disease 10/27/2014   Small vessel disease  . Cervical disc disease   . Chronic lower back pain 06/04/2015   lumbar spondylosis. Regular f/u Dr. French Ana. Receives injections last 01/2013.  05/27/15 Xray left hip/pelvis: spondylotic changes, severe osteoarthritis Continue Tylenol and Tramadol prn for pain, observe the patient, adding Cymbalta may help. Mid to lower back pain more on the right side back, may consider X-ray of thoracic spine. 06/01/15 CT thoracic spine showed no acute changes.  Patient received 10 injection in the back through Dr. Alvester Morin office. She says the previous problems of discomfort in the back and down the leg have completely resolved. Takes Aleve 220mg  bid   . Debility 06/20/2015   11/15/15 Hgb 12.8, Na 139, K 4.2, Bun 14, creat 0.68, TP 5.7, albumin 3.5, TSH 1.54, Hgb a1c 4.4   . Degenerative disc disease, lumbar 10/27/2014  . Depression   . Dermatitis 03/25/2016  . Diarrhea 10/25/14  . Diverticulosis 10/27/2014  . Essential hypertension 09/20/2006   11/15/15 Hgb 12.8, Na 139, K 4.2, Bun 14, creat 0.68, TP 5.7, albumin 3.5, TSH 1.54, Hgb a1c 4.4   . Fall at nursing home 10/25/2014  . GERD (gastroesophageal reflux disease)   . History of cardiovascular disorder 09/20/2006   X 3 in 2000 now on aspirin.    Marland Kitchen Hyperlipidemia 09/20/2006   05/15/14 LDL 95   . Hypertension   . Lumbosacral spondylosis  10/28/2009  . OSTEOPOROSIS 09/20/2006   Noted 2008    . Overactive bladder 11/29/2013   Myrbetriq prescribed by urology.    . Protein calorie malnutrition ([place])    06/06/15 TP 6.0 albumin 2.9  . SCOLIOSIS 10/28/2009  . Scoliosis 10/27/2014  . Thrombocytosis ([place]) 05/31/2015   06/10/15 plt 602 07/23/15 plt 335 11/15/15 Hgb 12.8, Na 139, K 4.2, Bun 14, creat 0.68, TP 5.7, albumin 3.5, TSH 1.54, Hgb a1c 4.4  . TIA (transient ischemic attack) 09/20/2006   Past Surgical History:  Procedure Laterality Date  . ABDOMINAL HYSTERECTOMY    .  APPENDECTOMY    . CHOLECYSTECTOMY    . TONSILLECTOMY      Allergies  Allergen Reactions  . Tramadol Other (See Comments)    Hallucinations, agitation/combativeness  . Amoxicillin     Per MAR  . Oxycodone Other (See Comments)    Per MAR   . Sulfamethoxazole     Per MAR   . Penicillins Rash    Outpatient Encounter Prescriptions as of 08/31/2016  Medication Sig  . acetaminophen (TYLENOL) 325 MG tablet Take 650 mg by mouth every 4 (four) hours as needed for moderate pain or headache.  . albuterol (PROVENTIL HFA;VENTOLIN HFA) 108 (90 BASE) MCG/ACT inhaler Inhale 2 puffs into the lungs every 6 (six) hours as needed for wheezing or shortness of breath.  Marland Kitchen alum & mag hydroxide-simeth (MAALOX/MYLANTA) 200-200-20 MG/5ML suspension Take 30 mLs by mouth as needed for indigestion or heartburn.  Marland Kitchen amLODipine (NORVASC) 5 MG tablet Take 5 mg by mouth daily with breakfast.   . clobetasol cream (TEMOVATE) 1.85 % Apply 1 application topically. Rub on affected area twice a day.   May self administer and keep at at bedside  . DULoxetine (CYMBALTA) 20 MG capsule Take 20 mg by mouth daily.  . Emollient (CETAPHIL MOISTURIZING EX) Apply topically. Apply cream once a day  . loperamide (IMODIUM) 2 MG capsule Take one to two tablets by mouth as needed for diarrhea, Max 16 mg/24 hours call MD if Diarrhea continues  . losartan (COZAAR) 100 MG tablet Take 100 mg by mouth daily with breakfast.   . Mirabegron ER (MYRBETRIQ) 25 MG TB24 Take 25 mg by mouth daily with breakfast. Prescribed by urology  . naproxen sodium (ANAPROX) 220 MG tablet Take 220 mg by mouth 2 (two) times daily with a meal.  . nitroGLYCERIN (NITROSTAT) 0.4 MG SL tablet Place 0.4 mg under the tongue every 5 (five) minutes as needed for chest pain. After taking three times and no relief call MD.  . omeprazole (PRILOSEC) 20 MG capsule Take 20 mg by mouth daily.  . polyethylene glycol (MIRALAX / GLYCOLAX) packet Take 17 g by mouth daily.  Marland Kitchen pyridOXINE  (VITAMIN B-6) 100 MG tablet Take 100 mg by mouth daily with breakfast.    Facility-Administered Encounter Medications as of 08/31/2016  Medication  . 0.9 %  sodium chloride infusion    Review of Systems  Immunization History  Administered Date(s) Administered  . HiB (PRP-OMP) 02/12/2004  . Influenza Split 01/11/2012  . Influenza Whole 01/07/2009  . Influenza-Unspecified 01/11/2014, 01/01/2015, 01/29/2016  . PPD Test 11/14/2014, 06/03/2015, 06/17/2015  . Pneumococcal Polysaccharide-23 04/28/2010  . Tdap 08/24/2011  . Zoster 04/13/2010   Pertinent  Health Maintenance Due  Topic Date Due  . DEXA SCAN  01/28/1991  . PNA vac Low Risk Adult (2 of 2 - PCV13) 04/29/2011  . INFLUENZA VACCINE  11/11/2016   Fall Risk  06/20/2015 05/23/2015 11/09/2014  05/10/2014  Falls in the past year? No No No No   Functional Status Survey:    Vitals:   08/31/16 1148  BP: 138/70  Pulse: 82  Resp: 16  Temp: 98.1 F (36.7 C)  Weight: 153 lb 3.2 oz (69.5 kg)  Height: 5\' 5"  (1.651 m)   Body mass index is 25.49 kg/m. Physical Exam  Labs reviewed:  Recent Labs  11/14/15 05/14/16  NA 139 139  K 4.2 4.5  BUN 14 14  CREATININE 0.7 0.7    Recent Labs  11/14/15 05/14/16  AST 17 19  ALT 15 17  ALKPHOS 67 64    Recent Labs  11/14/15 05/14/16 08/04/16  WBC 6.0 6.6 8.0  HGB 12.8 13.0 13.6  HCT 38 38 39  PLT 317 371 416*   Lab Results  Component Value Date   TSH 1.42 05/14/2016   Lab Results  Component Value Date   HGBA1C 4.4 11/14/2015   Lab Results  Component Value Date   CHOL 176 05/15/2014   HDL 62 05/15/2014   LDLCALC 95 05/15/2014   LDLDIRECT 114.3 12/08/2007   TRIG 93 05/15/2014   CHOLHDL 2.9 CALC 12/08/2007    Significant Diagnostic Results in last 30 days:  No results found.  Assessment/Plan There are no diagnoses linked to this encounter.   Family/ staff Communication:   Labs/tests ordered:

## 2016-08-31 NOTE — Progress Notes (Signed)
Location:  [facility] Room Number: 009 Place of Service:  ALF [phone]) Provider:  Hedi Barkan, [place]  NP  Estill Dooms, MD  Patient Care Team: Estill Dooms, MD as PCP - General (Internal Medicine) Pallas Wahlert X, NP as Nurse Practitioner (Nurse Practitioner)  Extended Emergency Contact Information Primary Emergency Contact: [name] Address: [place], [place] [address] [name] of [place] Phone: [phone] Work Phone: [phone] Mobile Phone: [phone] Relation: Daughter Secondary Emergency Contact: [name] Address: [address] [place] [address] [name] of [place] Phone: [phone] Mobile Phone: [phone] Relation: Son  Code Status:  DNR Goals of care: Advanced Directive information Advanced Directives 08/31/2016  Does Patient Have a Medical Advance Directive? Yes  Type of Paramedic of [place];Living will;Out of facility DNR (pink MOST or yellow form)  Does patient want to make changes to medical advance directive? No - Patient declined  Copy of [place] in Chart? Yes  Pre-existing out of facility DNR order (yellow form or pink MOST form) Yellow form placed in chart (order not valid for inpatient use)     Chief Complaint  Patient presents with  . Acute Visit    ? shingles    HPI:  Pt is a 83 y.o. female seen today for an acute visit for left thigh skin eruptions, denied itching or pain, a group of folliculitis appearance skin eruptions, not responding to Clobetasol and Nystatin.   Hx of back and hip pain, She takes Naproxen 220mg  bid for back and hip pain.Patient received 10 injection in the back through Dr. Alvester Morin office. She says the previous problems of discomfort in the back and down the leg have completely resolved. Blood pressure is controlled Amlodipine 5mg , Losartan 100mg . Urinary frequent, managed with  Myrbetriq 25mg , mood is stable, but c/o not sleep well at night, on Cymbalta 20mg . OffIron for anemia. Last Hgb 13.6 08/04/16  Past Medical History:  Diagnosis Date  . Abnormality of gait 04/28/2010   History of fall onto right shoulder-has participated in PT. Uses walker.     . Acute encephalopathy 10/25/14   Occurred when septic  . Acute lower GI bleeding 10/30/2014  . Adjustment disorder with mixed anxiety and depressed mood 06/04/2015   11/15/15 Hgb 12.8, Na 139, K 4.2, Bun 14, creat 0.68, TP 5.7, albumin 3.5, TSH 1.54, Hgb a1c 4.4   . Anemia   . Aortic atherosclerosis ([facility]) 10/27/2014  . Asthma   . Cerebral atrophy 10/27/2014  . Cerebrovascular disease 10/27/2014   Small vessel disease  . Cervical disc disease   . Chronic lower back pain 06/04/2015   lumbar spondylosis. Regular f/u Dr. French Ana. Receives injections last 01/2013.  05/27/15 Xray left hip/pelvis: spondylotic changes, severe osteoarthritis Continue Tylenol and Tramadol prn for pain, observe the patient, adding Cymbalta may help. Mid to lower back pain more on the right side back, may consider X-ray of thoracic spine. 06/01/15 CT thoracic spine showed no acute changes.  Patient received 10 injection in the back through Dr. Alvester Morin office. She says the previous problems of discomfort in the back and down the leg have completely resolved. Takes Aleve 220mg  bid   . Debility 06/20/2015   11/15/15 Hgb 12.8, Na 139, K 4.2, Bun 14, creat 0.68, TP 5.7, albumin 3.5, TSH 1.54, Hgb a1c 4.4   . Degenerative  disc disease, lumbar 10/27/2014  . Depression   . Dermatitis 03/25/2016  . Diarrhea 10/25/14  . Diverticulosis 10/27/2014  . Essential hypertension 09/20/2006   11/15/15 Hgb 12.8, Na 139, K 4.2, Bun 14, creat 0.68, TP 5.7, albumin 3.5, TSH 1.54, Hgb a1c 4.4   . Fall at nursing home 10/25/2014  . GERD (gastroesophageal reflux disease)   . History of cardiovascular disorder 09/20/2006   X 3 in 2000 now on aspirin.    Marland Kitchen Hyperlipidemia 09/20/2006    05/15/14 LDL 95   . Hypertension   . Lumbosacral spondylosis 10/28/2009  . OSTEOPOROSIS 09/20/2006   Noted 2008    . Overactive bladder 11/29/2013   Myrbetriq prescribed by urology.    . Protein calorie malnutrition ([place])    06/06/15 TP 6.0 albumin 2.9  . SCOLIOSIS 10/28/2009  . Scoliosis 10/27/2014  . Thrombocytosis ([facility]) 05/31/2015   06/10/15 plt 602 07/23/15 plt 335 11/15/15 Hgb 12.8, Na 139, K 4.2, Bun 14, creat 0.68, TP 5.7, albumin 3.5, TSH 1.54, Hgb a1c 4.4  . TIA (transient ischemic attack) 09/20/2006   Past Surgical History:  Procedure Laterality Date  . ABDOMINAL HYSTERECTOMY    . APPENDECTOMY    . CHOLECYSTECTOMY    . TONSILLECTOMY      Allergies  Allergen Reactions  . Tramadol Other (See Comments)    Hallucinations, agitation/combativeness  . Amoxicillin     Per MAR  . Oxycodone Other (See Comments)    Per MAR   . Sulfamethoxazole     Per MAR   . Penicillins Rash    Outpatient Encounter Prescriptions as of 08/31/2016  Medication Sig  . acetaminophen (TYLENOL) 325 MG tablet Take 650 mg by mouth every 4 (four) hours as needed for moderate pain or headache.  . albuterol (PROVENTIL HFA;VENTOLIN HFA) 108 (90 BASE) MCG/ACT inhaler Inhale 2 puffs into the lungs every 6 (six) hours as needed for wheezing or shortness of breath.  Marland Kitchen alum & mag hydroxide-simeth (MAALOX/MYLANTA) 200-200-20 MG/5ML suspension Take 30 mLs by mouth as needed for indigestion or heartburn.  Marland Kitchen amLODipine (NORVASC) 5 MG tablet Take 5 mg by mouth daily with breakfast.   . clobetasol cream (TEMOVATE) 4.09 % Apply 1 application topically. Rub on affected area twice a day.   May self administer and keep at at bedside  . DULoxetine (CYMBALTA) 20 MG capsule Take 20 mg by mouth daily.  . Emollient (CETAPHIL MOISTURIZING EX) Apply topically. Apply cream once a day  . loperamide (IMODIUM) 2 MG capsule Take one to two tablets by mouth as needed for diarrhea, Max 16 mg/24 hours call MD if Diarrhea continues  . losartan  (COZAAR) 100 MG tablet Take 100 mg by mouth daily with breakfast.   . Mirabegron ER (MYRBETRIQ) 25 MG TB24 Take 25 mg by mouth daily with breakfast. Prescribed by urology  . naproxen sodium (ANAPROX) 220 MG tablet Take 220 mg by mouth 2 (two) times daily with a meal.  . nitroGLYCERIN (NITROSTAT) 0.4 MG SL tablet Place 0.4 mg under the tongue every 5 (five) minutes as needed for chest pain. After taking three times and no relief call MD.  . omeprazole (PRILOSEC) 20 MG capsule Take 20 mg by mouth daily.  . polyethylene glycol (MIRALAX / GLYCOLAX) packet Take 17 g by mouth daily.  Marland Kitchen pyridOXINE (VITAMIN B-6) 100 MG tablet Take 100 mg by mouth daily with breakfast.    Facility-Administered Encounter Medications as of 08/31/2016  Medication  . 0.9 %  sodium chloride infusion  Review of Systems  Constitutional: Negative for chills, diaphoresis and fever.  HENT: Positive for hearing loss. Negative for congestion, ear discharge, ear pain, nosebleeds, sore throat and tinnitus.   Eyes: Negative for photophobia, pain, discharge and redness.  Respiratory: Negative for cough, shortness of breath, wheezing and stridor.   Cardiovascular: Positive for leg swelling. Negative for chest pain and palpitations.       Trace edema BLE  Gastrointestinal: Negative for abdominal pain, blood in stool, constipation, diarrhea (Under control with Questran and Imodium), nausea and vomiting.  Endocrine: Negative for polydipsia.  Genitourinary: Positive for frequency. Negative for dysuria, flank pain, hematuria and urgency.       1-2x/night. Stress incontinence  Musculoskeletal: Positive for arthralgias and gait problem. Negative for back pain, myalgias and neck pain.       Resolution of Left lower back, left hip, left thigh pain with weight bearing and position changes Chest pain was relieved after Mylanta 05/28/15 Neck pain 05/29/15  Skin: Negative for rash.       Thighs have small red spots present since summer  2017. 08/31/16 more in the left mid thigh  Allergic/Immunologic: Negative for environmental allergies.  Neurological: Negative for dizziness, tremors, seizures, weakness and headaches.       Memory loss. Patient had delirium when septic.  Hematological: Does not bruise/bleed easily.       Resolved anemia secondary to acute blood loss from lower GI bleeding. Plt count has returned to normal.  Psychiatric/Behavioral: Positive for sleep disturbance. Negative for agitation, confusion, hallucinations and suicidal ideas. Dysphoric mood: resolved. The patient is not nervous/anxious.     Immunization History  Administered Date(s) Administered  . HiB (PRP-OMP) 02/12/2004  . Influenza Split 01/11/2012  . Influenza Whole 01/07/2009  . Influenza-Unspecified 01/11/2014, 01/01/2015, 01/29/2016  . PPD Test 11/14/2014, 06/03/2015, 06/17/2015  . Pneumococcal Polysaccharide-23 04/28/2010  . Tdap 08/24/2011  . Zoster 04/13/2010   Pertinent  Health Maintenance Due  Topic Date Due  . DEXA SCAN  01/28/1991  . PNA vac Low Risk Adult (2 of 2 - PCV13) 04/29/2011  . INFLUENZA VACCINE  11/11/2016   Fall Risk  06/20/2015 05/23/2015 11/09/2014 05/10/2014  Falls in the past year? No No No No   Functional Status Survey:    Vitals:   08/31/16 1148  BP: 138/70  Pulse: 82  Resp: 16  Temp: 98.1 F (36.7 C)  Weight: 153 lb 3.2 oz (69.5 kg)  Height: 5\' 5"  (1.651 m)   Body mass index is 25.49 kg/m. Physical Exam  Constitutional: She is oriented to person, place, and time. She appears well-developed and well-nourished. No distress.  HENT:  Head: Normocephalic and atraumatic.  Right Ear: No lacerations. No drainage. No foreign bodies. No mastoid tenderness. Tympanic membrane is not injected, not scarred, not perforated, not erythematous, not retracted and not bulging. Tympanic membrane mobility is abnormal. No middle ear effusion. Decreased hearing is noted.  Left Ear: No lacerations. No drainage. No foreign  bodies. No mastoid tenderness. Tympanic membrane is not injected, not scarred, not perforated, not erythematous, not retracted and not bulging. Tympanic membrane mobility is abnormal.  No middle ear effusion. Decreased hearing is noted.  Nose: Nose normal.  Mouth/Throat: Oropharynx is clear and moist. No oropharyngeal exudate.  Eyes: Conjunctivae and EOM are normal. Pupils are equal, round, and reactive to light. Right eye exhibits no discharge. Left eye exhibits no discharge. No scleral icterus.  Neck: Normal range of motion. Neck supple. No JVD present. No tracheal deviation present.  No thyromegaly present.  Cardiovascular: Normal rate, regular rhythm, normal heart sounds and intact distal pulses.  Exam reveals no gallop and no friction rub.   No murmur heard. Pulmonary/Chest: Effort normal. No stridor. No respiratory distress. She has no wheezes. She has no rales. She exhibits no tenderness.  Coarse rales posterior mid to lower lungs.  Abdominal: Soft. Bowel sounds are normal. She exhibits no distension and no mass. There is no tenderness. There is no rebound and no guarding.  Genitourinary: Rectal exam shows guaiac negative stool.  Musculoskeletal: She exhibits edema and tenderness.  Resolved Left lower back, left hip, left thigh pain with weight bearing and position changes Chest pain was relieved after Mylanta 05/28/15 Neck pain 05/29/15 BLE trace edema  Lymphadenopathy:    She has no cervical adenopathy.  Neurological: She is alert and oriented to person, place, and time. She has normal reflexes. No cranial nerve deficit. She exhibits normal muscle tone. Coordination normal.  Skin: Skin is warm and dry. No rash noted. She is not diaphoretic. No erythema. No pallor.  Lots of moles. Red spots arms and legs. 08/31/16 more in a group of a palm size, non itching or pain.   Psychiatric: Her behavior is normal. Thought content normal.    Labs reviewed:  Recent Labs  11/14/15 05/14/16  NA 139  139  K 4.2 4.5  BUN 14 14  CREATININE 0.7 0.7    Recent Labs  11/14/15 05/14/16  AST 17 19  ALT 15 17  ALKPHOS 67 64    Recent Labs  11/14/15 05/14/16 08/04/16  WBC 6.0 6.6 8.0  HGB 12.8 13.0 13.6  HCT 38 38 39  PLT 317 371 416*   Lab Results  Component Value Date   TSH 1.42 05/14/2016   Lab Results  Component Value Date   HGBA1C 4.4 11/14/2015   Lab Results  Component Value Date   CHOL 176 05/15/2014   HDL 62 05/15/2014   LDLCALC 95 05/15/2014   LDLDIRECT 114.3 12/08/2007   TRIG 93 05/15/2014   CHOLHDL 2.9 CALC 12/08/2007    Significant Diagnostic Results in last 30 days:  No results found.  Assessment/Plan Dermatitis The left thigh skin eruptions, denied itching or pain, a group of folliculitis appearance skin eruptions, not responding to Clobetasol and Nystatin.  08/31/16 trail of Bactroban oint bid, f/u Dermatology.    Adjustment disorder with mixed anxiety and depressed mood In setting of migratory aches and pains, stabilized, but she c/o of not sleep and rest well at night, dc Cymbalta 20mg  daily, update CBC CMP UA C/S, may consider Mirtazapine if sleeping problem persists.   Essential hypertension Controlled, continue Amlodipine 5mg , Losartan 100mg   GERD (gastroesophageal reflux disease) Stable, continue prn Mylanta, Omeprazole 20mg   Overactive bladder Saw Urology 06/06/14-continued with Myrbetriq 25mg .  Stress incontinent in natures. 3-4x/night     Family/ staff Communication: AL  Labs/tests ordered:  CBC CMP UA C/S

## 2016-08-31 NOTE — Assessment & Plan Note (Addendum)
In setting of migratory aches and pains, stabilized, but she c/o of not sleep and rest well at night, dc Cymbalta 20mg  daily, update CBC CMP UA C/S, may consider Mirtazapine if sleeping problem persists.

## 2016-08-31 NOTE — Assessment & Plan Note (Signed)
Saw Urology 06/06/14-continued with Myrbetriq 25mg .  Stress incontinent in natures. 3-4x/night

## 2016-09-01 DIAGNOSIS — E162 Hypoglycemia, unspecified: Secondary | ICD-10-CM | POA: Diagnosis not present

## 2016-09-01 DIAGNOSIS — D62 Acute posthemorrhagic anemia: Secondary | ICD-10-CM | POA: Diagnosis not present

## 2016-09-01 DIAGNOSIS — K2971 Gastritis, unspecified, with bleeding: Secondary | ICD-10-CM | POA: Diagnosis not present

## 2016-09-01 DIAGNOSIS — N3281 Overactive bladder: Secondary | ICD-10-CM | POA: Diagnosis not present

## 2016-09-01 DIAGNOSIS — N390 Urinary tract infection, site not specified: Secondary | ICD-10-CM | POA: Diagnosis not present

## 2016-09-01 LAB — CBC AND DIFFERENTIAL
HCT: 37 % (ref 36–46)
HEMOGLOBIN: 12.8 g/dL (ref 12.0–16.0)
PLATELETS: 378 10*3/uL (ref 150–399)
WBC: 5.3 10*3/mL

## 2016-09-01 LAB — BASIC METABOLIC PANEL
BUN: 14 mg/dL (ref 4–21)
Creatinine: 0.7 mg/dL (ref <=1.1)
GLUCOSE: 97 mg/dL
POTASSIUM: 4 mmol/L (ref 3.4–5.3)
Sodium: 137 mmol/L (ref 137–147)

## 2016-09-01 LAB — HEPATIC FUNCTION PANEL
ALK PHOS: 66 U/L (ref 25–125)
ALT: 16 U/L (ref 7–35)
AST: 17 U/L (ref 13–35)
Bilirubin, Total: 0.6 mg/dL

## 2016-09-02 ENCOUNTER — Other Ambulatory Visit: Payer: Self-pay | Admitting: *Deleted

## 2016-09-29 DIAGNOSIS — K2971 Gastritis, unspecified, with bleeding: Secondary | ICD-10-CM | POA: Diagnosis not present

## 2016-09-29 DIAGNOSIS — R269 Unspecified abnormalities of gait and mobility: Secondary | ICD-10-CM | POA: Diagnosis not present

## 2016-09-29 DIAGNOSIS — R2689 Other abnormalities of gait and mobility: Secondary | ICD-10-CM | POA: Diagnosis not present

## 2016-09-29 DIAGNOSIS — R2681 Unsteadiness on feet: Secondary | ICD-10-CM | POA: Diagnosis not present

## 2016-09-29 DIAGNOSIS — W19XXXA Unspecified fall, initial encounter: Secondary | ICD-10-CM | POA: Diagnosis not present

## 2016-09-29 DIAGNOSIS — Z9181 History of falling: Secondary | ICD-10-CM | POA: Diagnosis not present

## 2016-09-29 DIAGNOSIS — M6281 Muscle weakness (generalized): Secondary | ICD-10-CM | POA: Diagnosis not present

## 2016-09-29 DIAGNOSIS — H81399 Other peripheral vertigo, unspecified ear: Secondary | ICD-10-CM | POA: Diagnosis not present

## 2016-09-29 DIAGNOSIS — S82001D Unspecified fracture of right patella, subsequent encounter for closed fracture with routine healing: Secondary | ICD-10-CM | POA: Diagnosis not present

## 2016-09-29 DIAGNOSIS — M25552 Pain in left hip: Secondary | ICD-10-CM | POA: Diagnosis not present

## 2016-10-01 DIAGNOSIS — M25552 Pain in left hip: Secondary | ICD-10-CM | POA: Diagnosis not present

## 2016-10-01 DIAGNOSIS — K2971 Gastritis, unspecified, with bleeding: Secondary | ICD-10-CM | POA: Diagnosis not present

## 2016-10-01 DIAGNOSIS — M6281 Muscle weakness (generalized): Secondary | ICD-10-CM | POA: Diagnosis not present

## 2016-10-01 DIAGNOSIS — R2681 Unsteadiness on feet: Secondary | ICD-10-CM | POA: Diagnosis not present

## 2016-10-01 DIAGNOSIS — R2689 Other abnormalities of gait and mobility: Secondary | ICD-10-CM | POA: Diagnosis not present

## 2016-10-01 DIAGNOSIS — H81399 Other peripheral vertigo, unspecified ear: Secondary | ICD-10-CM | POA: Diagnosis not present

## 2016-10-02 DIAGNOSIS — M6281 Muscle weakness (generalized): Secondary | ICD-10-CM | POA: Diagnosis not present

## 2016-10-02 DIAGNOSIS — K2971 Gastritis, unspecified, with bleeding: Secondary | ICD-10-CM | POA: Diagnosis not present

## 2016-10-02 DIAGNOSIS — R2689 Other abnormalities of gait and mobility: Secondary | ICD-10-CM | POA: Diagnosis not present

## 2016-10-02 DIAGNOSIS — M25552 Pain in left hip: Secondary | ICD-10-CM | POA: Diagnosis not present

## 2016-10-02 DIAGNOSIS — R2681 Unsteadiness on feet: Secondary | ICD-10-CM | POA: Diagnosis not present

## 2016-10-02 DIAGNOSIS — H81399 Other peripheral vertigo, unspecified ear: Secondary | ICD-10-CM | POA: Diagnosis not present

## 2016-10-06 DIAGNOSIS — M25552 Pain in left hip: Secondary | ICD-10-CM | POA: Diagnosis not present

## 2016-10-06 DIAGNOSIS — R2689 Other abnormalities of gait and mobility: Secondary | ICD-10-CM | POA: Diagnosis not present

## 2016-10-06 DIAGNOSIS — M6281 Muscle weakness (generalized): Secondary | ICD-10-CM | POA: Diagnosis not present

## 2016-10-06 DIAGNOSIS — R2681 Unsteadiness on feet: Secondary | ICD-10-CM | POA: Diagnosis not present

## 2016-10-06 DIAGNOSIS — H81399 Other peripheral vertigo, unspecified ear: Secondary | ICD-10-CM | POA: Diagnosis not present

## 2016-10-06 DIAGNOSIS — K2971 Gastritis, unspecified, with bleeding: Secondary | ICD-10-CM | POA: Diagnosis not present

## 2016-10-07 ENCOUNTER — Encounter: Payer: Self-pay | Admitting: Nurse Practitioner

## 2016-10-07 ENCOUNTER — Non-Acute Institutional Stay: Payer: Medicare Other | Admitting: Nurse Practitioner

## 2016-10-07 DIAGNOSIS — J209 Acute bronchitis, unspecified: Secondary | ICD-10-CM | POA: Diagnosis not present

## 2016-10-07 DIAGNOSIS — R05 Cough: Secondary | ICD-10-CM | POA: Diagnosis not present

## 2016-10-07 DIAGNOSIS — N3281 Overactive bladder: Secondary | ICD-10-CM

## 2016-10-07 DIAGNOSIS — F4323 Adjustment disorder with mixed anxiety and depressed mood: Secondary | ICD-10-CM

## 2016-10-07 DIAGNOSIS — J440 Chronic obstructive pulmonary disease with acute lower respiratory infection: Secondary | ICD-10-CM

## 2016-10-07 DIAGNOSIS — I10 Essential (primary) hypertension: Secondary | ICD-10-CM

## 2016-10-07 NOTE — Assessment & Plan Note (Signed)
Stable, off Cymbalta.

## 2016-10-07 NOTE — Assessment & Plan Note (Signed)
Controlled, continue Amlodipine 5mg , Losartan 100mg 

## 2016-10-07 NOTE — Assessment & Plan Note (Signed)
Saw Urology 06/06/14-continued with Myrbetriq 25mg .  Stress incontinent in natures. 3-4x/night

## 2016-10-07 NOTE — Progress Notes (Signed)
Location:  [facility] Room Number: 440 Place of Service:  ALF [id number]) Provider:  Nguyet Mercer, Manxie  NP   Blanchie Serve, MD  Patient Care Team: Blanchie Serve, MD as PCP - General (Internal Medicine) Aarish Rockers X, NP as Nurse Practitioner (Nurse Practitioner)  Extended Emergency Contact Information Primary Emergency Contact: [name] Address: [place], [place] [address] [name] of [place] Phone: [phone] Work Phone: [phone] Mobile Phone: [phone] Relation: Daughter Secondary Emergency Contact: [name] Address: [address] [place] [address] [name] of [place] Phone: [phone] Mobile Phone: [phone] Relation: Son  Code Status:  DNR Goals of care: Advanced Directive information Advanced Directives 10/07/2016  Does Patient Have a Medical Advance Directive? Yes  Type of Paramedic of [place];Living will;Out of facility DNR (pink MOST or yellow form)  Does patient want to make changes to medical advance directive? No - Patient declined  Copy of [place] in Chart? Yes  Pre-existing out of facility DNR order (yellow form or pink MOST form) Yellow form placed in chart (order not valid for inpatient use)     Chief Complaint  Patient presents with  . Acute Visit    SOB, cough    HPI:  Pt is a 83 y.o. female seen today for an acute visit for congestive cough, wheezing, fatigue, SOB, denied chest pain, palpitation, no O2 desaturation, she is afebrile.     Hx of back and hip pain, She takes Naproxen 220mg  bidfor back and hip pain.Patient received 10 injection in the back through Dr. Alvester Morin office. She says the previous problems of discomfort in the back and down the leg have completely resolved. Blood pressure is controlled Amlodipine 5mg , Losartan 100mg . Urinary frequent, managed with Myrbetriq 25mg , mood is stable,  sleep better since off Cymbalta 20mg . OffIron for anemia. Last Hgb 12.8 09/01/16   Past Medical History:  Diagnosis Date  . Abnormality of gait 04/28/2010   History of fall onto right shoulder-has participated in PT. Uses walker.     . Acute encephalopathy 10/25/14   Occurred when septic  . Acute lower GI bleeding 10/30/2014  . Adjustment disorder with mixed anxiety and depressed mood 06/04/2015   11/15/15 Hgb 12.8, Na 139, K 4.2, Bun 14, creat 0.68, TP 5.7, albumin 3.5, TSH 1.54, Hgb a1c 4.4   . Anemia   . Aortic atherosclerosis ([place]) 10/27/2014  . Asthma   . Cerebral atrophy 10/27/2014  . Cerebrovascular disease 10/27/2014   Small vessel disease  . Cervical disc disease   . Chronic lower back pain 06/04/2015   lumbar spondylosis. Regular f/u Dr. French Ana. Receives injections last 01/2013.  05/27/15 Xray left hip/pelvis: spondylotic changes, severe osteoarthritis Continue Tylenol and Tramadol prn for pain, observe the patient, adding Cymbalta may help. Mid to lower back pain more on the right side back, may consider X-ray of thoracic spine. 06/01/15 CT thoracic spine showed no acute changes.  Patient received 10 injection in the back through Dr. Alvester Morin office. She says the previous problems of discomfort in the back and down the leg have completely resolved. Takes Aleve 220mg  bid   . Debility 06/20/2015   11/15/15 Hgb 12.8, Na 139, K 4.2, Bun 14, creat 0.68, TP 5.7, albumin 3.5, TSH 1.54, Hgb a1c 4.4   . Degenerative disc disease, lumbar 10/27/2014  . Depression   .  Dermatitis 03/25/2016  . Diarrhea 10/25/14  . Diverticulosis 10/27/2014  . Essential hypertension 09/20/2006   11/15/15 Hgb 12.8, Na 139, K 4.2, Bun 14, creat 0.68, TP 5.7, albumin 3.5, TSH 1.54, Hgb a1c 4.4   . Fall at nursing home 10/25/2014  . GERD (gastroesophageal reflux disease)   . History of cardiovascular disorder 09/20/2006   X 3 in 2000 now on aspirin.    Marland Kitchen Hyperlipidemia 09/20/2006   05/15/14 LDL 95   . Hypertension   .  Lumbosacral spondylosis 10/28/2009  . OSTEOPOROSIS 09/20/2006   Noted 2008    . Overactive bladder 11/29/2013   Myrbetriq prescribed by urology.    . Protein calorie malnutrition ([place])    06/06/15 TP 6.0 albumin 2.9  . SCOLIOSIS 10/28/2009  . Scoliosis 10/27/2014  . Thrombocytosis ([place]) 05/31/2015   06/10/15 plt 602 07/23/15 plt 335 11/15/15 Hgb 12.8, Na 139, K 4.2, Bun 14, creat 0.68, TP 5.7, albumin 3.5, TSH 1.54, Hgb a1c 4.4  . TIA (transient ischemic attack) 09/20/2006   Past Surgical History:  Procedure Laterality Date  . ABDOMINAL HYSTERECTOMY    . APPENDECTOMY    . CHOLECYSTECTOMY    . TONSILLECTOMY      Allergies  Allergen Reactions  . Tramadol Other (See Comments)    Hallucinations, agitation/combativeness  . Amoxicillin     Per MAR  . Oxycodone Other (See Comments)    Per MAR   . Sulfamethoxazole     Per MAR   . Penicillins Rash    Outpatient Encounter Prescriptions as of 10/07/2016  Medication Sig  . acetaminophen (TYLENOL) 325 MG tablet Take 650 mg by mouth every 4 (four) hours as needed for moderate pain or headache.  . albuterol (PROVENTIL HFA;VENTOLIN HFA) 108 (90 BASE) MCG/ACT inhaler Inhale 2 puffs into the lungs every 6 (six) hours as needed for wheezing or shortness of breath.  Marland Kitchen alum & mag hydroxide-simeth (MAALOX/MYLANTA) 200-200-20 MG/5ML suspension Take 30 mLs by mouth as needed for indigestion or heartburn.  Marland Kitchen amLODipine (NORVASC) 5 MG tablet Take 5 mg by mouth daily with breakfast.   . clobetasol cream (TEMOVATE) 4.76 % Apply 1 application topically. Rub on affected area twice a day.   May self administer and keep at at bedside  . dextromethorphan 7.5 MG/5ML SYRP Take 10 mLs by mouth every 6 (six) hours as needed.  . Emollient (CETAPHIL MOISTURIZING EX) Apply topically. Apply cream once a day  . loperamide (IMODIUM) 2 MG capsule Take one to two tablets by mouth as needed for diarrhea, Max 16 mg/24 hours call MD if Diarrhea continues  . losartan (COZAAR) 100 MG  tablet Take 100 mg by mouth daily with breakfast.   . Mirabegron ER (MYRBETRIQ) 25 MG TB24 Take 25 mg by mouth daily with breakfast. Prescribed by urology  . mirtazapine (REMERON) 7.5 MG tablet Take 7.5 mg by mouth at bedtime.  . naproxen sodium (ANAPROX) 220 MG tablet Take 220 mg by mouth 2 (two) times daily with a meal.  . nitroGLYCERIN (NITROSTAT) 0.4 MG SL tablet Place 0.4 mg under the tongue every 5 (five) minutes as needed for chest pain. After taking three times and no relief call MD.  . omeprazole (PRILOSEC) 20 MG capsule Take 20 mg by mouth daily.  . polyethylene glycol (MIRALAX / GLYCOLAX) packet Take 17 g by mouth daily.  Marland Kitchen pyridOXINE (VITAMIN B-6) 100 MG tablet Take 100 mg by mouth daily with breakfast.   . [DISCONTINUED] DULoxetine (CYMBALTA) 20 MG capsule Take 20  mg by mouth daily.   Facility-Administered Encounter Medications as of 10/07/2016  Medication  . 0.9 %  sodium chloride infusion    Review of Systems  Constitutional: Negative for chills, diaphoresis and fever.  HENT: Positive for hearing loss. Negative for congestion, ear discharge, ear pain, nosebleeds, sore throat and tinnitus.   Eyes: Negative for photophobia, pain, discharge and redness.  Respiratory: Positive for cough and wheezing. Negative for shortness of breath and stridor.   Cardiovascular: Positive for leg swelling. Negative for chest pain and palpitations.       Trace edema BLE R>L  Gastrointestinal: Negative for abdominal pain, blood in stool, constipation, diarrhea (Under control with Questran and Imodium), nausea and vomiting.  Endocrine: Negative for polydipsia.  Genitourinary: Positive for frequency. Negative for dysuria, flank pain, hematuria and urgency.       1-2x/night. Stress incontinence  Musculoskeletal: Positive for arthralgias and gait problem. Negative for back pain, myalgias and neck pain.       Resolution of Left lower back, left hip, left thigh pain with weight bearing and position  changes Chest pain was relieved after Mylanta 05/28/15 Neck pain 05/29/15  Skin: Negative for rash.       Thighs have small red spots present since summer 2017. 08/31/16 more in the left mid thigh  Allergic/Immunologic: Negative for environmental allergies.  Neurological: Negative for dizziness, tremors, seizures, weakness and headaches.       Memory loss. Patient had delirium when septic.  Hematological: Does not bruise/bleed easily.       Resolved anemia secondary to acute blood loss from lower GI bleeding. Plt count has returned to normal.  Psychiatric/Behavioral: Positive for sleep disturbance. Negative for agitation, confusion, hallucinations and suicidal ideas. Dysphoric mood: resolved. The patient is not nervous/anxious.     Immunization History  Administered Date(s) Administered  . HiB (PRP-OMP) 02/12/2004  . Influenza Split 01/11/2012  . Influenza Whole 01/07/2009  . Influenza-Unspecified 01/11/2014, 01/01/2015, 01/29/2016  . PPD Test 11/14/2014, 06/03/2015, 06/17/2015  . Pneumococcal Polysaccharide-23 04/28/2010  . Tdap 08/24/2011  . Zoster 04/13/2010   Pertinent  Health Maintenance Due  Topic Date Due  . DEXA SCAN  01/28/1991  . PNA vac Low Risk Adult (2 of 2 - PCV13) 04/29/2011  . INFLUENZA VACCINE  11/11/2016   Fall Risk  06/20/2015 05/23/2015 11/09/2014 05/10/2014  Falls in the past year? No No No No   Functional Status Survey:    Vitals:   10/07/16 1029  BP: 130/60  Pulse: 68  Resp: 20  Temp: 97.8 F (36.6 C)  SpO2: 96%  Weight: 156 lb 6.4 oz (70.9 kg)  Height: 5\' 5"  (1.651 m)   Body mass index is 26.03 kg/m. Physical Exam  Constitutional: She is oriented to person, place, and time. She appears well-developed and well-nourished. No distress.  HENT:  Head: Normocephalic and atraumatic.  Right Ear: No lacerations. No drainage. No foreign bodies. No mastoid tenderness. Tympanic membrane is not injected, not scarred, not perforated, not erythematous, not  retracted and not bulging. Tympanic membrane mobility is abnormal. No middle ear effusion. Decreased hearing is noted.  Left Ear: No lacerations. No drainage. No foreign bodies. No mastoid tenderness. Tympanic membrane is not injected, not scarred, not perforated, not erythematous, not retracted and not bulging. Tympanic membrane mobility is abnormal.  No middle ear effusion. Decreased hearing is noted.  Nose: Nose normal.  Mouth/Throat: Oropharynx is clear and moist. No oropharyngeal exudate.  Eyes: Conjunctivae and EOM are normal. Pupils are equal, round,  and reactive to light. Right eye exhibits no discharge. Left eye exhibits no discharge. No scleral icterus.  Neck: Normal range of motion. Neck supple. No JVD present. No tracheal deviation present. No thyromegaly present.  Cardiovascular: Normal rate, regular rhythm, normal heart sounds and intact distal pulses.  Exam reveals no gallop and no friction rub.   No murmur heard. Pulmonary/Chest: Effort normal. No stridor. No respiratory distress. She has wheezes. She has rales. She exhibits no tenderness.  Coarse rales posterior mid to lower lungs.  Abdominal: Soft. Bowel sounds are normal. She exhibits no distension and no mass. There is no tenderness. There is no rebound and no guarding.  Genitourinary: Rectal exam shows guaiac negative stool.  Musculoskeletal: She exhibits edema and tenderness.  Resolved Left lower back, left hip, left thigh pain with weight bearing and position changes Chest pain was relieved after Mylanta 05/28/15 Neck pain 05/29/15 BLE trace edema  Lymphadenopathy:    She has no cervical adenopathy.  Neurological: She is alert and oriented to person, place, and time. She has normal reflexes. No cranial nerve deficit. She exhibits normal muscle tone. Coordination normal.  Skin: Skin is warm and dry. No rash noted. She is not diaphoretic. No erythema. No pallor.  Lots of moles. Red spots arms and legs. 08/31/16 more in a group  of a palm size, non itching or pain.   Psychiatric: Her behavior is normal. Thought content normal.    Labs reviewed:  Recent Labs  11/14/15 05/14/16 09/01/16  NA 139 139 137  K 4.2 4.5 4.0  BUN 14 14 14   CREATININE 0.7 0.7 0.7    Recent Labs  11/14/15 05/14/16 09/01/16  AST 17 19 17   ALT 15 17 16   ALKPHOS 67 64 66    Recent Labs  05/14/16 08/04/16 09/01/16  WBC 6.6 8.0 5.3  HGB 13.0 13.6 12.8  HCT 38 39 37  PLT 371 416* 378   Lab Results  Component Value Date   TSH 1.42 05/14/2016   Lab Results  Component Value Date   HGBA1C 4.4 11/14/2015   Lab Results  Component Value Date   CHOL 176 05/15/2014   HDL 62 05/15/2014   LDLCALC 95 05/15/2014   LDLDIRECT 114.3 12/08/2007   TRIG 93 05/15/2014   CHOLHDL 2.9 CALC 12/08/2007    Significant Diagnostic Results in last 30 days:  No results found.  Assessment/Plan Acute bronchitis with COPD (HCC) congestive cough, wheezing, fatigue, SOB, denied chest pain, palpitation, no O2 desaturation, she is afebrile.  Obtain CXR, CBC, CMP Start 7 day course of Avelox 400mg  po qd, DuoNeb tid and Mucinex 600mg  bid x 3 days, Medrol dose pk  Essential hypertension Controlled, continue Amlodipine 5mg , Losartan 100mg   Overactive bladder Saw Urology 06/06/14-continued with Myrbetriq 25mg .  Stress incontinent in natures. 3-4x/night  Adjustment disorder with mixed anxiety and depressed mood Stable, off Cymbalta.      Family/ staff Communication: AL  Labs/tests ordered: CBC CMP CXR

## 2016-10-07 NOTE — Assessment & Plan Note (Signed)
congestive cough, wheezing, fatigue, SOB, denied chest pain, palpitation, no O2 desaturation, she is afebrile.  Obtain CXR, CBC, CMP Start 7 day course of Avelox 400mg  po qd, DuoNeb tid and Mucinex 600mg  bid x 3 days, Medrol dose pk

## 2016-10-08 DIAGNOSIS — I10 Essential (primary) hypertension: Secondary | ICD-10-CM | POA: Diagnosis not present

## 2016-10-08 LAB — BASIC METABOLIC PANEL
BUN: 13 (ref 4–21)
Creatinine: 1 (ref <=1.1)
Glucose: 88
POTASSIUM: 4.3 (ref 3.4–5.3)
Sodium: 141 (ref 137–147)

## 2016-10-08 LAB — CBC AND DIFFERENTIAL
HCT: 39 (ref 36–46)
HEMOGLOBIN: 13.9 (ref 12.0–16.0)
PLATELETS: 375 (ref 150–399)
WBC: 7

## 2016-10-08 LAB — HEPATIC FUNCTION PANEL
ALK PHOS: 57 (ref 25–125)
ALT: 22 (ref 7–35)
AST: 23 (ref 13–35)
Bilirubin, Total: 0.5

## 2016-10-09 ENCOUNTER — Encounter: Payer: Self-pay | Admitting: Nurse Practitioner

## 2016-10-09 ENCOUNTER — Other Ambulatory Visit: Payer: Self-pay | Admitting: *Deleted

## 2016-10-09 ENCOUNTER — Non-Acute Institutional Stay: Payer: Medicare Other | Admitting: Nurse Practitioner

## 2016-10-09 DIAGNOSIS — J209 Acute bronchitis, unspecified: Secondary | ICD-10-CM

## 2016-10-09 DIAGNOSIS — K219 Gastro-esophageal reflux disease without esophagitis: Secondary | ICD-10-CM

## 2016-10-09 DIAGNOSIS — J440 Chronic obstructive pulmonary disease with acute lower respiratory infection: Secondary | ICD-10-CM

## 2016-10-09 DIAGNOSIS — I10 Essential (primary) hypertension: Secondary | ICD-10-CM

## 2016-10-09 NOTE — Assessment & Plan Note (Signed)
treated acute bronchitis, 7 day course of Avelox 400mg  qd started 10/07/16, improved congestive cough,  fatigue, SOB, but her wheezes persisted, 3 days of Duoneb helped, she denied chest pain, palpitation, no O2 desaturation, she is afebrile. Will continue DuoNeb x 3 days. Observe.

## 2016-10-09 NOTE — Progress Notes (Signed)
Location:  [facility] Room Number: 254 Place of Service:  ALF [phone]) Provider:  [name], Manxie  NP   Blanchie Serve, MD  Patient Care Team: Blanchie Serve, MD as PCP - General (Internal Medicine) [name], [name], NP as Nurse Practitioner (Nurse Practitioner)  Extended Emergency Contact Information Primary Emergency Contact: [name] Address: [place], [place] [address] [name] of [place] Phone: [phone] Work Phone: [phone] Mobile Phone: [phone] Relation: Daughter Secondary Emergency Contact: [name] Address: [address] [place] [address] [name] of [place] Phone: [phone] Mobile Phone: [phone] Relation: Son  Code Status:  DNR Goals of care: Advanced Directive information Advanced Directives 10/09/2016  Does Patient Have a Medical Advance Directive? Yes  Type of Paramedic of [place];Living will;Out of facility DNR (pink MOST or yellow form)  Does patient want to make changes to medical advance directive? No - Patient declined  Copy of [place] in Chart? Yes  Pre-existing out of facility DNR order (yellow form or pink MOST form) Yellow form placed in chart (order not valid for inpatient use)     Chief Complaint  Patient presents with  . Acute Visit    HPI:  Pt is a 83 y.o. female seen today for an acute visit for treated acute bronchitis, 7 day course of Avelox 400mg  qd started 10/07/16, improved congestive cough,  fatigue, SOB, but her wheezes persisted, 3 days of Duoneb helped, she denied chest pain, palpitation, no O2 desaturation, she is afebrile.    Hx of COPD, but stable, no taking maintenance therapy, not O2 dependent. Blood pressure is controlled, taking Amlodipine 5mg  and Losartan 100mg  qd. Her acid reflux is managed with Omeprazole 20mg , prn Mylanta.   Past Medical History:  Diagnosis Date  .  Abnormality of gait 04/28/2010   History of fall onto right shoulder-has participated in PT. Uses walker.     . Acute encephalopathy 10/25/14   Occurred when septic  . Acute lower GI bleeding 10/30/2014  . Adjustment disorder with mixed anxiety and depressed mood 06/04/2015   11/15/15 Hgb 12.8, Na 139, K 4.2, Bun 14, creat 0.68, TP 5.7, albumin 3.5, TSH 1.54, Hgb a1c 4.4   . Anemia   . Aortic atherosclerosis ([place]) 10/27/2014  . Asthma   . Cerebral atrophy 10/27/2014  . Cerebrovascular disease 10/27/2014   Small vessel disease  . Cervical disc disease   . Chronic lower back pain 06/04/2015   lumbar spondylosis. Regular f/u Dr. French Ana. Receives injections last 01/2013.  05/27/15 Xray left hip/pelvis: spondylotic changes, severe osteoarthritis Continue Tylenol and Tramadol prn for pain, observe the patient, adding Cymbalta may help. Mid to lower back pain more on the right side back, may consider X-ray of thoracic spine. 06/01/15 CT thoracic spine showed no acute changes.  Patient received 10 injection in the back through Dr. Alvester Morin office. She says the previous problems of discomfort in the back and down the leg have completely resolved. Takes Aleve 220mg  bid   . Debility 06/20/2015   11/15/15 Hgb 12.8, Na 139, K 4.2, Bun 14, creat 0.68, TP 5.7, albumin 3.5, TSH 1.54, Hgb a1c 4.4   . Degenerative disc disease, lumbar 10/27/2014  . Depression   . Dermatitis 03/25/2016  . Diarrhea 10/25/14  . Diverticulosis 10/27/2014  . Essential hypertension 09/20/2006   11/15/15 Hgb 12.8, Na  139, K 4.2, Bun 14, creat 0.68, TP 5.7, albumin 3.5, TSH 1.54, Hgb a1c 4.4   . Fall at nursing home 10/25/2014  . GERD (gastroesophageal reflux disease)   . History of cardiovascular disorder 09/20/2006   X 3 in 2000 now on aspirin.    Marland Kitchen Hyperlipidemia 09/20/2006   05/15/14 LDL 95   . Hypertension   . Lumbosacral spondylosis 10/28/2009  . OSTEOPOROSIS 09/20/2006   Noted 2008    . Overactive bladder 11/29/2013   Myrbetriq prescribed by  urology.    . Protein calorie malnutrition ([place])    06/06/15 TP 6.0 albumin 2.9  . SCOLIOSIS 10/28/2009  . Scoliosis 10/27/2014  . Thrombocytosis ([place]) 05/31/2015   06/10/15 plt 602 07/23/15 plt 335 11/15/15 Hgb 12.8, Na 139, K 4.2, Bun 14, creat 0.68, TP 5.7, albumin 3.5, TSH 1.54, Hgb a1c 4.4  . TIA (transient ischemic attack) 09/20/2006   Past Surgical History:  Procedure Laterality Date  . ABDOMINAL HYSTERECTOMY    . APPENDECTOMY    . CHOLECYSTECTOMY    . TONSILLECTOMY      Allergies  Allergen Reactions  . Tramadol Other (See Comments)    Hallucinations, agitation/combativeness  . Amoxicillin     Per MAR  . Oxycodone Other (See Comments)    Per MAR   . Sulfamethoxazole     Per MAR   . Penicillins Rash    Outpatient Encounter Prescriptions as of 10/09/2016  Medication Sig  . acetaminophen (TYLENOL) 325 MG tablet Take 650 mg by mouth every 4 (four) hours as needed for moderate pain or headache.  . albuterol (PROVENTIL HFA;VENTOLIN HFA) 108 (90 BASE) MCG/ACT inhaler Inhale 2 puffs into the lungs every 6 (six) hours as needed for wheezing or shortness of breath.  Marland Kitchen amLODipine (NORVASC) 5 MG tablet Take 5 mg by mouth daily with breakfast.   . clobetasol cream (TEMOVATE) 5.28 % Apply 1 application topically. Rub on affected area twice a day.   May self administer and keep at at bedside  . dextromethorphan 7.5 MG/5ML SYRP Take 10 mLs by mouth every 6 (six) hours as needed.  . Emollient (CETAPHIL MOISTURIZING EX) Apply topically. Apply cream once a day  . loperamide (IMODIUM) 2 MG capsule Take one to two tablets by mouth as needed for diarrhea, Max 16 mg/24 hours call MD if Diarrhea continues  . losartan (COZAAR) 100 MG tablet Take 100 mg by mouth daily with breakfast.   . Mirabegron ER (MYRBETRIQ) 25 MG TB24 Take 25 mg by mouth daily with breakfast. Prescribed by urology  . mirtazapine (REMERON) 7.5 MG tablet Take 7.5 mg by mouth at bedtime.  . naproxen sodium (ANAPROX) 220 MG tablet  Take 220 mg by mouth 2 (two) times daily with a meal.  . nitroGLYCERIN (NITROSTAT) 0.4 MG SL tablet Place 0.4 mg under the tongue every 5 (five) minutes as needed for chest pain. After taking three times and no relief call MD.  . omeprazole (PRILOSEC) 20 MG capsule Take 20 mg by mouth daily.  . polyethylene glycol (MIRALAX / GLYCOLAX) packet Take 17 g by mouth daily.  Marland Kitchen pyridOXINE (VITAMIN B-6) 100 MG tablet Take 100 mg by mouth daily with breakfast.   . [DISCONTINUED] alum & mag hydroxide-simeth (MAALOX/MYLANTA) 200-200-20 MG/5ML suspension Take 30 mLs by mouth as needed for indigestion or heartburn.   Facility-Administered Encounter Medications as of 10/09/2016  Medication  . 0.9 %  sodium chloride infusion    Review of Systems  Constitutional: Negative for chills, diaphoresis  and fever.  HENT: Positive for hearing loss. Negative for congestion, ear discharge, ear pain, nosebleeds, sore throat and tinnitus.   Eyes: Negative for photophobia, pain, discharge and redness.  Respiratory: Positive for cough and wheezing. Negative for shortness of breath and stridor.   Cardiovascular: Positive for leg swelling. Negative for chest pain and palpitations.       Trace edema BLE R>L  Gastrointestinal: Negative for abdominal pain, blood in stool, constipation, diarrhea (Under control with Questran and Imodium), nausea and vomiting.  Endocrine: Negative for polydipsia.  Genitourinary: Positive for frequency. Negative for dysuria, flank pain, hematuria and urgency.       1-2x/night. Stress incontinence  Musculoskeletal: Positive for arthralgias and gait problem. Negative for back pain, myalgias and neck pain.       Resolution of Left lower back, left hip, left thigh pain with weight bearing and position changes Chest pain was relieved after Mylanta 05/28/15 Neck pain 05/29/15  Allergic/Immunologic: Negative for environmental allergies.  Neurological: Negative for dizziness, tremors, seizures, weakness  and headaches.       Memory loss. Patient had delirium when septic.  Hematological: Does not bruise/bleed easily.       Resolved anemia secondary to acute blood loss from lower GI bleeding. Plt count has returned to normal.  Psychiatric/Behavioral: Dysphoric mood: resolved.    Immunization History  Administered Date(s) Administered  . HiB (PRP-OMP) 02/12/2004  . Influenza Split 01/11/2012  . Influenza Whole 01/07/2009  . Influenza-Unspecified 01/11/2014, 01/01/2015, 01/29/2016  . PPD Test 11/14/2014, 06/03/2015, 06/17/2015  . Pneumococcal Polysaccharide-23 04/28/2010  . Tdap 08/24/2011  . Zoster 04/13/2010   Pertinent  Health Maintenance Due  Topic Date Due  . DEXA SCAN  01/28/1991  . PNA vac Low Risk Adult (2 of 2 - PCV13) 04/29/2011  . INFLUENZA VACCINE  11/11/2016   Fall Risk  06/20/2015 05/23/2015 11/09/2014 05/10/2014  Falls in the past year? No No No No   Functional Status Survey:    Vitals:   10/09/16 1234  BP: 130/60  Pulse: 68  Resp: 20  Temp: 97.8 F (36.6 C)  Weight: 156 lb 6.4 oz (70.9 kg)  Height: 5\' 5"  (1.651 m)   Body mass index is 26.03 kg/m. Physical Exam  Constitutional: She is oriented to person, place, and time. She appears well-developed and well-nourished. No distress.  HENT:  Head: Normocephalic and atraumatic.  Right Ear: No lacerations. No drainage. No foreign bodies. No mastoid tenderness. Tympanic membrane is not injected, not scarred, not perforated, not erythematous, not retracted and not bulging. Tympanic membrane mobility is abnormal. No middle ear effusion. Decreased hearing is noted.  Left Ear: No lacerations. No drainage. No foreign bodies. No mastoid tenderness. Tympanic membrane is not injected, not scarred, not perforated, not erythematous, not retracted and not bulging. Tympanic membrane mobility is abnormal.  No middle ear effusion. Decreased hearing is noted.  Nose: Nose normal.  Mouth/Throat: Oropharynx is clear and moist. No  oropharyngeal exudate.  Eyes: Conjunctivae and EOM are normal. Pupils are equal, round, and reactive to light. Right eye exhibits no discharge. Left eye exhibits no discharge. No scleral icterus.  Neck: Normal range of motion. Neck supple. No JVD present. No tracheal deviation present. No thyromegaly present.  Cardiovascular: Normal rate, regular rhythm, normal heart sounds and intact distal pulses.  Exam reveals no gallop and no friction rub.   No murmur heard. Pulmonary/Chest: Effort normal. No stridor. No respiratory distress. She has wheezes. She has rales. She exhibits no tenderness.  Coarse  rales posterior mid to lower lungs.  Abdominal: Soft. Bowel sounds are normal. She exhibits no distension and no mass. There is no tenderness. There is no rebound and no guarding.  Genitourinary: Rectal exam shows guaiac negative stool.  Musculoskeletal: She exhibits edema and tenderness.  Resolved Left lower back, left hip, left thigh pain with weight bearing and position changes Chest pain was relieved after Mylanta 05/28/15 Neck pain 05/29/15 BLE trace edema  Lymphadenopathy:    She has no cervical adenopathy.  Neurological: She is alert and oriented to person, place, and time. She has normal reflexes. No cranial nerve deficit. She exhibits normal muscle tone. Coordination normal.  Skin: Skin is warm and dry. No rash noted. She is not diaphoretic. No erythema. No pallor.  Lots of moles. Red spots arms and legs. 08/31/16 more in a group of a palm size, non itching or pain.   Psychiatric: Her behavior is normal. Thought content normal.    Labs reviewed:  Recent Labs  05/14/16 09/01/16 10/08/16  NA 139 137 141  K 4.5 4.0 4.3  BUN 14 14 13   CREATININE 0.7 0.7 1.0    Recent Labs  05/14/16 09/01/16 10/08/16  AST 19 17 23   ALT 17 16 22   ALKPHOS 64 66 57    Recent Labs  08/04/16 09/01/16 10/08/16  WBC 8.0 5.3 7.0  HGB 13.6 12.8 13.9  HCT 39 37 39  PLT 416* 378 375   Lab Results    Component Value Date   TSH 1.42 05/14/2016   Lab Results  Component Value Date   HGBA1C 4.4 11/14/2015   Lab Results  Component Value Date   CHOL 176 05/15/2014   HDL 62 05/15/2014   LDLCALC 95 05/15/2014   LDLDIRECT 114.3 12/08/2007   TRIG 93 05/15/2014   CHOLHDL 2.9 CALC 12/08/2007    Significant Diagnostic Results in last 30 days:  No results found.  Assessment/Plan Acute bronchitis with COPD ([place]) treated acute bronchitis, 7 day course of Avelox 400mg  qd started 10/07/16, improved congestive cough,  fatigue, SOB, but her wheezes persisted, 3 days of Duoneb helped, she denied chest pain, palpitation, no O2 desaturation, she is afebrile. Will continue DuoNeb x 3 days. Observe.     Essential hypertension Controlled, continue Amlodipine 5mg , Losartan 100mg , 10/08/16 Na 141, K 4.3, Bun 13, creat 0.98, wbc 7.0, Hgb 13.9, plt 375  GERD (gastroesophageal reflux disease) Stable, continue prn Mylanta, Omeprazole 20mg      Family/ staff Communication: AL  Labs/tests ordered: none

## 2016-10-09 NOTE — Assessment & Plan Note (Signed)
Controlled, continue Amlodipine 5mg , Losartan 100mg , 10/08/16 Na 141, K 4.3, Bun 13, creat 0.98, wbc 7.0, Hgb 13.9, plt 375

## 2016-10-09 NOTE — Assessment & Plan Note (Signed)
Stable, continue prn Mylanta, Omeprazole 20mg 

## 2016-10-12 DIAGNOSIS — M25552 Pain in left hip: Secondary | ICD-10-CM | POA: Diagnosis not present

## 2016-10-12 DIAGNOSIS — R2681 Unsteadiness on feet: Secondary | ICD-10-CM | POA: Diagnosis not present

## 2016-10-12 DIAGNOSIS — H81399 Other peripheral vertigo, unspecified ear: Secondary | ICD-10-CM | POA: Diagnosis not present

## 2016-10-12 DIAGNOSIS — Z9181 History of falling: Secondary | ICD-10-CM | POA: Diagnosis not present

## 2016-10-12 DIAGNOSIS — W19XXXA Unspecified fall, initial encounter: Secondary | ICD-10-CM | POA: Diagnosis not present

## 2016-10-12 DIAGNOSIS — R269 Unspecified abnormalities of gait and mobility: Secondary | ICD-10-CM | POA: Diagnosis not present

## 2016-10-12 DIAGNOSIS — S82001D Unspecified fracture of right patella, subsequent encounter for closed fracture with routine healing: Secondary | ICD-10-CM | POA: Diagnosis not present

## 2016-10-12 DIAGNOSIS — K2971 Gastritis, unspecified, with bleeding: Secondary | ICD-10-CM | POA: Diagnosis not present

## 2016-10-12 DIAGNOSIS — M6281 Muscle weakness (generalized): Secondary | ICD-10-CM | POA: Diagnosis not present

## 2016-10-12 DIAGNOSIS — R2689 Other abnormalities of gait and mobility: Secondary | ICD-10-CM | POA: Diagnosis not present

## 2016-10-13 DIAGNOSIS — H81399 Other peripheral vertigo, unspecified ear: Secondary | ICD-10-CM | POA: Diagnosis not present

## 2016-10-13 DIAGNOSIS — R2689 Other abnormalities of gait and mobility: Secondary | ICD-10-CM | POA: Diagnosis not present

## 2016-10-13 DIAGNOSIS — R2681 Unsteadiness on feet: Secondary | ICD-10-CM | POA: Diagnosis not present

## 2016-10-13 DIAGNOSIS — M25552 Pain in left hip: Secondary | ICD-10-CM | POA: Diagnosis not present

## 2016-10-13 DIAGNOSIS — M6281 Muscle weakness (generalized): Secondary | ICD-10-CM | POA: Diagnosis not present

## 2016-10-13 DIAGNOSIS — K2971 Gastritis, unspecified, with bleeding: Secondary | ICD-10-CM | POA: Diagnosis not present

## 2016-10-15 DIAGNOSIS — R2681 Unsteadiness on feet: Secondary | ICD-10-CM | POA: Diagnosis not present

## 2016-10-15 DIAGNOSIS — R2689 Other abnormalities of gait and mobility: Secondary | ICD-10-CM | POA: Diagnosis not present

## 2016-10-15 DIAGNOSIS — M6281 Muscle weakness (generalized): Secondary | ICD-10-CM | POA: Diagnosis not present

## 2016-10-15 DIAGNOSIS — H81399 Other peripheral vertigo, unspecified ear: Secondary | ICD-10-CM | POA: Diagnosis not present

## 2016-10-15 DIAGNOSIS — K2971 Gastritis, unspecified, with bleeding: Secondary | ICD-10-CM | POA: Diagnosis not present

## 2016-10-15 DIAGNOSIS — M25552 Pain in left hip: Secondary | ICD-10-CM | POA: Diagnosis not present

## 2016-10-16 ENCOUNTER — Emergency Department (HOSPITAL_COMMUNITY)
Admission: EM | Admit: 2016-10-16 | Discharge: 2016-10-16 | Disposition: A | Discharge status: Home / Self Care | Payer: Medicare Other | Attending: Emergency Medicine | Admitting: Emergency Medicine

## 2016-10-16 ENCOUNTER — Emergency Department (HOSPITAL_COMMUNITY): Payer: Medicare Other

## 2016-10-16 ENCOUNTER — Encounter (HOSPITAL_COMMUNITY): Payer: Self-pay | Admitting: Nurse Practitioner

## 2016-10-16 DIAGNOSIS — R4182 Altered mental status, unspecified: Secondary | ICD-10-CM | POA: Diagnosis not present

## 2016-10-16 DIAGNOSIS — J45909 Unspecified asthma, uncomplicated: Secondary | ICD-10-CM | POA: Diagnosis not present

## 2016-10-16 DIAGNOSIS — S62609A Fracture of unspecified phalanx of unspecified finger, initial encounter for closed fracture: Secondary | ICD-10-CM | POA: Diagnosis not present

## 2016-10-16 DIAGNOSIS — I10 Essential (primary) hypertension: Secondary | ICD-10-CM | POA: Insufficient documentation

## 2016-10-16 DIAGNOSIS — Z79899 Other long term (current) drug therapy: Secondary | ICD-10-CM | POA: Diagnosis not present

## 2016-10-16 DIAGNOSIS — S62614B Displaced fracture of proximal phalanx of right ring finger, initial encounter for open fracture: Secondary | ICD-10-CM | POA: Diagnosis not present

## 2016-10-16 DIAGNOSIS — Y929 Unspecified place or not applicable: Secondary | ICD-10-CM | POA: Diagnosis not present

## 2016-10-16 DIAGNOSIS — S62644B Nondisplaced fracture of proximal phalanx of right ring finger, initial encounter for open fracture: Secondary | ICD-10-CM

## 2016-10-16 DIAGNOSIS — M79646 Pain in unspecified finger(s): Secondary | ICD-10-CM | POA: Diagnosis not present

## 2016-10-16 DIAGNOSIS — Y999 Unspecified external cause status: Secondary | ICD-10-CM | POA: Insufficient documentation

## 2016-10-16 DIAGNOSIS — Y939 Activity, unspecified: Secondary | ICD-10-CM | POA: Diagnosis not present

## 2016-10-16 DIAGNOSIS — Z880 Allergy status to penicillin: Secondary | ICD-10-CM | POA: Insufficient documentation

## 2016-10-16 DIAGNOSIS — W230XXA Caught, crushed, jammed, or pinched between moving objects, initial encounter: Secondary | ICD-10-CM | POA: Insufficient documentation

## 2016-10-16 DIAGNOSIS — S61214A Laceration without foreign body of right ring finger without damage to nail, initial encounter: Secondary | ICD-10-CM | POA: Diagnosis present

## 2016-10-16 MED ORDER — ACETAMINOPHEN 325 MG PO TABS
650.0000 mg | ORAL_TABLET | Freq: Once | ORAL | Status: AC
  Administered 2016-10-16: 650 mg via ORAL
  Filled 2016-10-16: qty 2

## 2016-10-16 MED ORDER — CEPHALEXIN 500 MG PO CAPS: 500.0000 mg | ORAL_CAPSULE | Freq: Two times a day (BID) | ORAL | 0 refills | Status: DC

## 2016-10-16 MED ORDER — BACITRACIN ZINC 500 UNIT/GM EX OINT
TOPICAL_OINTMENT | CUTANEOUS | Status: AC
  Filled 2016-10-16: qty 0.9

## 2016-10-16 MED ORDER — CEFAZOLIN SODIUM 1 G IJ SOLR
1.0000 g | Freq: Once | INTRAMUSCULAR | Status: DC
  Filled 2016-10-16: qty 10

## 2016-10-16 MED ORDER — LIDOCAINE HCL (PF) 1 % IJ SOLN
10.0000 mL | Freq: Once | INTRAMUSCULAR | Status: AC
  Administered 2016-10-16: 10 mL via INTRADERMAL
  Filled 2016-10-16: qty 30

## 2016-10-16 MED ORDER — CEPHALEXIN 500 MG PO CAPS
500.0000 mg | ORAL_CAPSULE | Freq: Once | ORAL | Status: AC
  Administered 2016-10-16: 500 mg via ORAL
  Filled 2016-10-16: qty 1

## 2016-10-16 NOTE — ED Provider Notes (Signed)
[facility] DEPT Provider Note   CSN: [id number] Arrival date & time: 10/16/16  1750     History   Chief Complaint Chief Complaint  Patient presents with  . Finger Injury    Right Ring Finger    HPI Brandy Crawford is a 83 y.o. female.  HPI  83 year old female presents with a right ring finger injury. Patient was getting a wheelchair in her hand got caught in between the wheelchair. She suffered a linear laceration but continues to mildly bleed. She denies being on blood thinners. She thinks her last tetanus immunization was less than 5 years ago. Denies any numbness or inability to move her finger.  Past Medical History:  Diagnosis Date  . Abnormality of gait 04/28/2010   History of fall onto right shoulder-has participated in PT. Uses walker.     . Acute encephalopathy 10/25/14   Occurred when septic  . Acute lower GI bleeding 10/30/2014  . Adjustment disorder with mixed anxiety and depressed mood 06/04/2015   11/15/15 Hgb 12.8, Na 139, K 4.2, Bun 14, creat 0.68, TP 5.7, albumin 3.5, TSH 1.54, Hgb a1c 4.4   . Anemia   . Aortic atherosclerosis ([place]) 10/27/2014  . Asthma   . Cerebral atrophy 10/27/2014  . Cerebrovascular disease 10/27/2014   Small vessel disease  . Cervical disc disease   . Chronic lower back pain 06/04/2015   lumbar spondylosis. Regular f/u Dr. French Ana. Receives injections last 01/2013.  05/27/15 Xray left hip/pelvis: spondylotic changes, severe osteoarthritis Continue Tylenol and Tramadol prn for pain, observe the patient, adding Cymbalta may help. Mid to lower back pain more on the right side back, may consider X-ray of thoracic spine. 06/01/15 CT thoracic spine showed no acute changes.  Patient received 10 injection in the back through Dr. Alvester Morin office. She says the previous problems of discomfort in the back and down the leg have completely resolved. Takes Aleve 220mg  bid   . Debility 06/20/2015   11/15/15 Hgb 12.8, Na 139, K 4.2, Bun 14, creat 0.68, TP 5.7,  albumin 3.5, TSH 1.54, Hgb a1c 4.4   . Degenerative disc disease, lumbar 10/27/2014  . Depression   . Dermatitis 03/25/2016  . Diarrhea 10/25/14  . Diverticulosis 10/27/2014  . Essential hypertension 09/20/2006   11/15/15 Hgb 12.8, Na 139, K 4.2, Bun 14, creat 0.68, TP 5.7, albumin 3.5, TSH 1.54, Hgb a1c 4.4   . Fall at nursing home 10/25/2014  . GERD (gastroesophageal reflux disease)   . History of cardiovascular disorder 09/20/2006   X 3 in 2000 now on aspirin.    Marland Kitchen Hyperlipidemia 09/20/2006   05/15/14 LDL 95   . Hypertension   . Lumbosacral spondylosis 10/28/2009  . OSTEOPOROSIS 09/20/2006   Noted 2008    . Overactive bladder 11/29/2013   Myrbetriq prescribed by urology.    . Protein calorie malnutrition ([place])    06/06/15 TP 6.0 albumin 2.9  . SCOLIOSIS 10/28/2009  . Scoliosis 10/27/2014  . Thrombocytosis ([place]) 05/31/2015   06/10/15 plt 602 07/23/15 plt 335 11/15/15 Hgb 12.8, Na 139, K 4.2, Bun 14, creat 0.68, TP 5.7, albumin 3.5, TSH 1.54, Hgb a1c 4.4  . TIA (transient ischemic attack) 09/20/2006    Patient Active Problem List   Diagnosis Date Noted  . Dermatitis 03/25/2016  . Debility 06/20/2015  . Adjustment disorder with mixed anxiety and depressed mood 06/04/2015  . GERD (gastroesophageal reflux disease) 06/04/2015  . Hypertonicity of bladder 06/04/2015  . Acute bronchitis with COPD ([place]) 05/09/2015  .  Overactive bladder 11/29/2013  . Essential hypertension 09/20/2006    Past Surgical History:  Procedure Laterality Date  . ABDOMINAL HYSTERECTOMY    . APPENDECTOMY    . CHOLECYSTECTOMY    . TONSILLECTOMY      OB History    No data available       Home Medications    Prior to Admission medications   Medication Sig Start Date End Date Taking? Authorizing Provider  acetaminophen (TYLENOL) 325 MG tablet Take 650 mg by mouth every 4 (four) hours as needed for moderate pain or headache.   Yes [provider]  albuterol (PROVENTIL HFA;VENTOLIN HFA) 108 (90 BASE) MCG/ACT  inhaler Inhale 2 puffs into the lungs every 6 (six) hours as needed for wheezing or shortness of breath. 02/19/14  Yes Marin Olp, MD  amLODipine (NORVASC) 5 MG tablet Take 5 mg by mouth daily with breakfast.    Yes [provider]  loperamide (IMODIUM) 2 MG capsule Take 2 mg by mouth daily as needed for diarrhea or loose stools. Take one to two tablets by mouth as needed for diarrhea, Max 16 mg/24 hours call MD if Diarrhea continues   Yes [provider]  losartan (COZAAR) 100 MG tablet Take 100 mg by mouth daily with breakfast.    Yes [provider]  Mirabegron ER (MYRBETRIQ) 25 MG TB24 Take 25 mg by mouth daily with breakfast. Prescribed by urology   Yes [provider]  mirtazapine (REMERON) 7.5 MG tablet Take 7.5 mg by mouth at bedtime.   Yes [provider]  naproxen sodium (ANAPROX) 220 MG tablet Take 220 mg by mouth 2 (two) times daily as needed. Pain   Yes [provider]  nitroGLYCERIN (NITROSTAT) 0.4 MG SL tablet Place 0.4 mg under the tongue every 5 (five) minutes as needed for chest pain. After taking three times and no relief call MD.   Yes [provider]  omeprazole (PRILOSEC) 20 MG capsule Take 20 mg by mouth daily.   Yes [provider]  polyethylene glycol (MIRALAX / GLYCOLAX) packet Take 17 g by mouth daily as needed for moderate constipation.    Yes [provider]  pyridOXINE (VITAMIN B-6) 100 MG tablet Take 100 mg by mouth daily with breakfast.    Yes [provider]  cephALEXin (KEFLEX) 500 MG capsule Take 1 capsule (500 mg total) by mouth 2 (two) times daily. 10/16/16   Sherwood Gambler, MD  ipratropium-albuterol (DUONEB) 0.5-2.5 (3) MG/3ML SOLN Take 3 mLs by nebulization 3 (three) times daily. 10/09/16   [provider]  moxifloxacin (AVELOX) 400 MG tablet Take 400 mg by mouth daily at 8 pm. Pt was on a 7 day course. Pt took last dose on 10/13/16 10/07/16   [provider]    Family History Family History  Problem Relation Age of Onset  . Pancreatic cancer Father 36       deceased  . Heart failure Mother 104       deceased  . Leukemia Brother 35       deceased  . Diabetes type II Unknown   . Alcohol abuse Unknown     Social History Social History  Substance Use Topics  . Smoking status: Never Smoker  . Smokeless tobacco: Never Used  . Alcohol use No     Allergies   Tramadol; Amoxicillin; Oxycodone; Sulfamethoxazole; and Penicillins   Review of Systems Review of Systems  Musculoskeletal: Positive for arthralgias.  Skin: Positive for  wound.  Neurological: Negative for weakness and numbness.  All other systems reviewed and are negative.    Physical Exam Updated Vital Signs BP 110/78   Pulse 80   Temp 99.3 F (37.4 C) (Oral)   Resp 18   SpO2 95%   Physical Exam  Constitutional: She is oriented to person, place, and time. She appears well-developed and well-nourished.  HENT:  Head: Normocephalic and atraumatic.  Eyes: Right eye exhibits no discharge. Left eye exhibits no discharge.  Pulmonary/Chest: Effort normal.  Musculoskeletal:       Right hand: She exhibits tenderness and laceration. She exhibits no swelling. Normal sensation noted. Normal strength noted.       Hands: Normal ROM of right ring finger with extension/flexion. Normal sensation. Normal capillary refill  Neurological: She is alert and oriented to person, place, and time.  Skin: Skin is warm and dry. Capillary refill takes less than 2 seconds.  Nursing note and vitals reviewed.    ED Treatments / Results  Labs (all labs ordered are listed, but only abnormal results are displayed) Labs Reviewed - No data to display  EKG  EKG Interpretation None       Radiology Dg Finger Ring Right  Result Date: 10/16/2016 CLINICAL DATA:  Laceration after finger caught in wheelchair EXAM: RIGHT FOURTH FINGER 2+V COMPARISON:  None. FINDINGS: Frontal, oblique, and  lateral views were obtained. There are fractures of the mid the distal aspects of the fourth proximal phalanx with alignment near anatomic at fracture sites. There is soft tissue swelling in this area. No other fractures. No dislocations. There is slight narrowing of the fourth PIP and DIP joints. IMPRESSION: Fractures of the mid the distal aspects of the fourth proximal phalanx with soft tissue swelling. Alignment near anatomic. No other fractures. No dislocation. Mild narrowing of the PIP and DIP joints of the fourth digit. Electronically Signed   By: Lowella Grip III M.D.   On: 10/16/2016 19:44    Procedures .Marland KitchenLaceration Repair Date/Time: 10/16/2016 9:18 PM Performed by: Sherwood Gambler Authorized by: Sherwood Gambler   Consent:    Consent obtained:  Verbal   Consent given by:  Patient Anesthesia (see MAR for exact dosages):    Anesthesia method:  Nerve block   Block location:  Right ring finger   Block needle gauge:  25 G   Block anesthetic:  Lidocaine 1% w/o epi   Block technique:  Digital block   Block injection procedure:  Anatomic landmarks identified, anatomic landmarks palpated, negative aspiration for blood and introduced needle   Block outcome:  Anesthesia achieved Laceration details:    Location:  Finger   Finger location:  R ring finger   Length (cm):  1.5 Repair type:    Repair type:  Intermediate Pre-procedure details:    Preparation:  Patient was prepped and draped in usual sterile fashion and imaging obtained to evaluate for foreign bodies Exploration:    Contaminated: no   Treatment:    Area cleansed with:  Saline   Amount of cleaning:  Extensive   Irrigation solution:  Sterile saline   Irrigation method:  Syringe Skin repair:    Repair method:  Sutures   Suture size:  4-0   Suture material:  Prolene   Suture technique:  Simple interrupted   Number of sutures:  3 Approximation:    Approximation:  Close   Vermilion border: well-aligned   Post-procedure  details:    Dressing:  Antibiotic ointment and splint for protection  Patient tolerance of procedure:  Tolerated well, no immediate complications .Nerve Block Date/Time: 10/16/2016 9:19 PM Performed by: Sherwood Gambler Authorized by: Sherwood Gambler   Consent:    Consent obtained:  Verbal   Consent given by:  Patient Indications:    Indications:  Procedural anesthesia Location:    Body area:  Upper extremity   Upper extremity nerve blocked: digital.   Laterality:  Right Pre-procedure details:    Skin preparation:  Alcohol Skin anesthesia (see MAR for exact dosages):    Skin anesthesia method:  None Procedure details (see MAR for exact dosages):    Block needle gauge:  25 G   Anesthetic injected:  Lidocaine 1% w/o epi   Injection procedure:  Anatomic landmarks identified, anatomic landmarks palpated, introduced needle and negative aspiration for blood Post-procedure details:    Outcome:  Anesthesia achieved   Patient tolerance of procedure:  Tolerated well, no immediate complications   (including critical care time)  Medications Ordered in ED Medications  bacitracin 500 UNIT/GM ointment (not administered)  acetaminophen (TYLENOL) tablet 650 mg (650 mg Oral Given 10/16/16 1849)  lidocaine (PF) (XYLOCAINE) 1 % injection 10 mL (10 mLs Intradermal Given 10/16/16 1850)  cephALEXin (KEFLEX) capsule 500 mg (500 mg Oral Given 10/16/16 2059)     Initial Impression / Assessment and Plan / ED Course  I have reviewed the triage vital signs and the nursing notes.  Pertinent labs & imaging results that were available during my care of the patient were reviewed by me and considered in my medical decision making (see chart for details).     Discussed case with Dr. Grandville Silos, hand orthopedics, who asks for irrigation of wound, Keflex, and skin closure. Tetanus is up-to-date. No other injuries. Patient was placed in a finger splint after repair. Dr. Biagio Borg office will call for appointment  next week. Discussed return precautions. She has a penicillin allergy but has had Rocephin in the past .  Final Clinical Impressions(s) / ED Diagnoses   Final diagnoses:  Open nondisplaced fracture of proximal phalanx of right ring finger, initial encounter    New Prescriptions Discharge Medication List as of 10/16/2016  9:35 PM    START taking these medications   Details  cephALEXin (KEFLEX) 500 MG capsule Take 1 capsule (500 mg total) by mouth 2 (two) times daily., Starting Fri 10/16/2016, Print         Sherwood Gambler, MD 10/16/16 [phone]

## 2016-10-16 NOTE — ED Notes (Signed)
[facility] at [facility] was called and notified of pt's discharge--- discharge instructions and prescription provided.

## 2016-10-16 NOTE — ED Notes (Signed)
Bed: [facility] Expected date:  Expected time:  Means of arrival:  Comments: EMS-finger injury

## 2016-10-16 NOTE — ED Notes (Signed)
[facility] here to transport pt back to [facility] at [place].

## 2016-10-16 NOTE — ED Notes (Signed)
[facility] was called for pt's transportation back to [facility] at [place].

## 2016-10-16 NOTE — ED Triage Notes (Signed)
Pt is presented from [place] at [facility] for evaluation of a right finger injury that pt reports happened when her hand was caught in between wheelchair. Bleeding is controlled at this time.

## 2016-10-19 ENCOUNTER — Non-Acute Institutional Stay: Payer: Medicare Other | Admitting: Nurse Practitioner

## 2016-10-19 ENCOUNTER — Encounter: Payer: Self-pay | Admitting: Nurse Practitioner

## 2016-10-19 DIAGNOSIS — J440 Chronic obstructive pulmonary disease with acute lower respiratory infection: Secondary | ICD-10-CM | POA: Diagnosis not present

## 2016-10-19 DIAGNOSIS — R2689 Other abnormalities of gait and mobility: Secondary | ICD-10-CM | POA: Diagnosis not present

## 2016-10-19 DIAGNOSIS — M25552 Pain in left hip: Secondary | ICD-10-CM | POA: Diagnosis not present

## 2016-10-19 DIAGNOSIS — S62644D Nondisplaced fracture of proximal phalanx of right ring finger, subsequent encounter for fracture with routine healing: Secondary | ICD-10-CM

## 2016-10-19 DIAGNOSIS — J209 Acute bronchitis, unspecified: Secondary | ICD-10-CM

## 2016-10-19 DIAGNOSIS — S62644B Nondisplaced fracture of proximal phalanx of right ring finger, initial encounter for open fracture: Secondary | ICD-10-CM | POA: Insufficient documentation

## 2016-10-19 DIAGNOSIS — K2971 Gastritis, unspecified, with bleeding: Secondary | ICD-10-CM | POA: Diagnosis not present

## 2016-10-19 DIAGNOSIS — M6281 Muscle weakness (generalized): Secondary | ICD-10-CM | POA: Diagnosis not present

## 2016-10-19 DIAGNOSIS — R2681 Unsteadiness on feet: Secondary | ICD-10-CM | POA: Diagnosis not present

## 2016-10-19 DIAGNOSIS — H81399 Other peripheral vertigo, unspecified ear: Secondary | ICD-10-CM | POA: Diagnosis not present

## 2016-10-19 NOTE — Progress Notes (Signed)
Location:  [facility] Room Number: 161 Place of Service:  ALF [phone]) Provider:  [name], Manxie  NP  Blanchie Serve, MD  Patient Care Team: Blanchie Serve, MD as PCP - General (Internal Medicine) [name], [name], NP as Nurse Practitioner (Nurse Practitioner)  Extended Emergency Contact Information Primary Emergency Contact: [name] Address: [place] [address], [place] [address] [name] of [place] Phone: [phone] Work Phone: [phone] Mobile Phone: [phone] Relation: Daughter Secondary Emergency Contact: [name] Address: [address] [place] [address] [name] of [place] Phone: [phone] Mobile Phone: [phone] Relation: Son  Code Status:  DNR Goals of care: Advanced Directive information Advanced Directives 10/19/2016  Does Patient Have a Medical Advance Directive? Yes  Type of Advance Directive Out of facility DNR (pink MOST or yellow form);Living will  Does patient want to make changes to medical advance directive? -  Copy of [place] in Chart? -  Pre-existing out of facility DNR order (yellow form or pink MOST form) -     Chief Complaint  Patient presents with  . Acute Visit    Fx to (rt) ring finger    HPI:  Pt is a 83 y.o. female seen today for an acute visit for    Past Medical History:  Diagnosis Date  . Abnormality of gait 04/28/2010   History of fall onto right shoulder-has participated in PT. Uses walker.     . Acute encephalopathy 10/25/14   Occurred when septic  . Acute lower GI bleeding 10/30/2014  . Adjustment disorder with mixed anxiety and depressed mood 06/04/2015   11/15/15 Hgb 12.8, Na 139, K 4.2, Bun 14, creat 0.68, TP 5.7, albumin 3.5, TSH 1.54, Hgb a1c 4.4   . Anemia   . Aortic atherosclerosis ([place]) 10/27/2014  . Asthma   . Cerebral atrophy 10/27/2014  . Cerebrovascular disease 10/27/2014   Small vessel disease  .  Cervical disc disease   . Chronic lower back pain 06/04/2015   lumbar spondylosis. Regular f/u Dr. French Ana. Receives injections last 01/2013.  05/27/15 Xray left hip/pelvis: spondylotic changes, severe osteoarthritis Continue Tylenol and Tramadol prn for pain, observe the patient, adding Cymbalta may help. Mid to lower back pain more on the right side back, may consider X-ray of thoracic spine. 06/01/15 CT thoracic spine showed no acute changes.  Patient received 10 injection in the back through Dr. Alvester Morin office. She says the previous problems of discomfort in the back and down the leg have completely resolved. Takes Aleve 220mg  bid   . Debility 06/20/2015   11/15/15 Hgb 12.8, Na 139, K 4.2, Bun 14, creat 0.68, TP 5.7, albumin 3.5, TSH 1.54, Hgb a1c 4.4   . Degenerative disc disease, lumbar 10/27/2014  . Depression   . Dermatitis 03/25/2016  . Diarrhea 10/25/14  . Diverticulosis 10/27/2014  . Essential hypertension 09/20/2006   11/15/15 Hgb 12.8, Na 139, K 4.2, Bun 14, creat 0.68, TP 5.7, albumin 3.5, TSH 1.54, Hgb a1c 4.4   . Fall at nursing home 10/25/2014  . GERD (gastroesophageal reflux disease)   . History of cardiovascular disorder 09/20/2006   X 3 in 2000 now on aspirin.    Marland Kitchen Hyperlipidemia 09/20/2006   05/15/14 LDL 95   . Hypertension   . Lumbosacral spondylosis 10/28/2009  . OSTEOPOROSIS 09/20/2006   Noted 2008    . Overactive bladder  11/29/2013   Myrbetriq prescribed by urology.    . Protein calorie malnutrition ([place])    06/06/15 TP 6.0 albumin 2.9  . SCOLIOSIS 10/28/2009  . Scoliosis 10/27/2014  . Thrombocytosis ([place]) 05/31/2015   06/10/15 plt 602 07/23/15 plt 335 11/15/15 Hgb 12.8, Na 139, K 4.2, Bun 14, creat 0.68, TP 5.7, albumin 3.5, TSH 1.54, Hgb a1c 4.4  . TIA (transient ischemic attack) 09/20/2006   Past Surgical History:  Procedure Laterality Date  . ABDOMINAL HYSTERECTOMY    . APPENDECTOMY    . CHOLECYSTECTOMY    . TONSILLECTOMY      Allergies  Allergen Reactions  . Tramadol Other  (See Comments)    Hallucinations, agitation/combativeness  . Amoxicillin     Per MAR  . Oxycodone Other (See Comments)    Per MAR   . Sulfamethoxazole     Per MAR   . Penicillins Rash    Outpatient Encounter Prescriptions as of 10/19/2016  Medication Sig  . acetaminophen (TYLENOL) 325 MG tablet Take 650 mg by mouth every 4 (four) hours as needed for moderate pain or headache.  . albuterol (PROVENTIL HFA;VENTOLIN HFA) 108 (90 BASE) MCG/ACT inhaler Inhale 2 puffs into the lungs every 6 (six) hours as needed for wheezing or shortness of breath.  Marland Kitchen alum & mag hydroxide-simeth (MAALOX/MYLANTA) 200-200-20 MG/5ML suspension Take 30 mLs by mouth every 6 (six) hours as needed for indigestion or heartburn.  Marland Kitchen amLODipine (NORVASC) 5 MG tablet Take 5 mg by mouth daily with breakfast.   . ipratropium-albuterol (DUONEB) 0.5-2.5 (3) MG/3ML SOLN Take 3 mLs by nebulization 3 (three) times daily.  Marland Kitchen loperamide (IMODIUM) 2 MG capsule Take 2 mg by mouth daily as needed for diarrhea or loose stools. Take one to two tablets by mouth as needed for diarrhea, Max 16 mg/24 hours call MD if Diarrhea continues  . losartan (COZAAR) 100 MG tablet Take 100 mg by mouth daily with breakfast.   . Mirabegron ER (MYRBETRIQ) 25 MG TB24 Take 25 mg by mouth daily with breakfast. Prescribed by urology  . mirtazapine (REMERON) 7.5 MG tablet Take 7.5 mg by mouth at bedtime.  . naproxen sodium (ANAPROX) 220 MG tablet Take 220 mg by mouth 2 (two) times daily as needed. Pain  . nitroGLYCERIN (NITROSTAT) 0.4 MG SL tablet Place 0.4 mg under the tongue every 5 (five) minutes as needed for chest pain. After taking three times and no relief call MD.  . omeprazole (PRILOSEC) 20 MG capsule Take 20 mg by mouth daily.  . polyethylene glycol (MIRALAX / GLYCOLAX) packet Take 17 g by mouth daily as needed for moderate constipation.   Marland Kitchen pyridOXINE (VITAMIN B-6) 100 MG tablet Take 100 mg by mouth daily with breakfast.   . [DISCONTINUED] cephALEXin  (KEFLEX) 500 MG capsule Take 1 capsule (500 mg total) by mouth 2 (two) times daily.  . [DISCONTINUED] moxifloxacin (AVELOX) 400 MG tablet Take 400 mg by mouth daily at 8 pm. Pt was on a 7 day course. Pt took last dose on 10/13/16   Facility-Administered Encounter Medications as of 10/19/2016  Medication  . 0.9 %  sodium chloride infusion    Review of Systems  Immunization History  Administered Date(s) Administered  . HiB (PRP-OMP) 02/12/2004  . Influenza Split 01/11/2012  . Influenza Whole 01/07/2009  . Influenza-Unspecified 01/11/2014, 01/01/2015, 01/29/2016  . PPD Test 11/14/2014, 06/03/2015, 06/17/2015  . Pneumococcal Polysaccharide-23 04/28/2010  . Tdap 08/24/2011  . Zoster 04/13/2010   Pertinent  Health Maintenance Due  Topic  Date Due  . DEXA SCAN  01/28/1991  . PNA vac Low Risk Adult (2 of 2 - PCV13) 04/29/2011  . INFLUENZA VACCINE  11/11/2016   Fall Risk  06/20/2015 05/23/2015 11/09/2014 05/10/2014  Falls in the past year? No No No No   Functional Status Survey:    Vitals:   10/19/16 1347  BP: 120/60  Pulse: 70  Resp: 18  Temp: 98.7 F (37.1 C)  Weight: 153 lb 6.4 oz (69.6 kg)  Height: 5\' 5"  (1.651 m)   Body mass index is 25.53 kg/m. Physical Exam  Labs reviewed:  Recent Labs  05/14/16 09/01/16 10/08/16  NA 139 137 141  K 4.5 4.0 4.3  BUN 14 14 13   CREATININE 0.7 0.7 1.0    Recent Labs  05/14/16 09/01/16 10/08/16  AST 19 17 23   ALT 17 16 22   ALKPHOS 64 66 57    Recent Labs  08/04/16 09/01/16 10/08/16  WBC 8.0 5.3 7.0  HGB 13.6 12.8 13.9  HCT 39 37 39  PLT 416* 378 375   Lab Results  Component Value Date   TSH 1.42 05/14/2016   Lab Results  Component Value Date   HGBA1C 4.4 11/14/2015   Lab Results  Component Value Date   CHOL 176 05/15/2014   HDL 62 05/15/2014   LDLCALC 95 05/15/2014   LDLDIRECT 114.3 12/08/2007   TRIG 93 05/15/2014   CHOLHDL 2.9 CALC 12/08/2007    Significant Diagnostic Results in last 30 days:  Dg Finger Ring  Right  Result Date: 10/16/2016 CLINICAL DATA:  Laceration after finger caught in wheelchair EXAM: RIGHT FOURTH FINGER 2+V COMPARISON:  None. FINDINGS: Frontal, oblique, and lateral views were obtained. There are fractures of the mid the distal aspects of the fourth proximal phalanx with alignment near anatomic at fracture sites. There is soft tissue swelling in this area. No other fractures. No dislocations. There is slight narrowing of the fourth PIP and DIP joints. IMPRESSION: Fractures of the mid the distal aspects of the fourth proximal phalanx with soft tissue swelling. Alignment near anatomic. No other fractures. No dislocation. Mild narrowing of the PIP and DIP joints of the fourth digit. Electronically Signed   By: Lowella Grip III M.D.   On: 10/16/2016 19:44    Assessment/Plan 1. Open nondisplaced fracture of proximal phalanx of right ring finger with routine healing, subsequent encounter   2. Acute bronchitis with COPD ([place])     Family/ staff Communication:   Labs/tests ordered:

## 2016-10-19 NOTE — Assessment & Plan Note (Addendum)
10/16/16 open nondisplaced fracture of proximal phalanx of right ring finger, sustained from her right hand caught between the seat and the ars to the arm rail while in w/c to dinning room, Keflex 500mg  bid x 7days while in ED, will f/u Ortho, monitor suture closure x3, continue finger splint.

## 2016-10-19 NOTE — Progress Notes (Signed)
Location:  [facility] Room Number: 546 Place of Service:  ALF [phone]) Provider:  Harshika Mago, Manxie  NP   Blanchie Serve, MD  Patient Care Team: Blanchie Serve, MD as PCP - General (Internal Medicine) Leandro Berkowitz X, NP as Nurse Practitioner (Nurse Practitioner)  Extended Emergency Contact Information Primary Emergency Contact: [name] Address: [place], [place] [address] [name] of [place] Phone: [phone] Work Phone: [phone] Mobile Phone: [phone] Relation: Daughter Secondary Emergency Contact: [name] Address: [address] [place] [address] [name] of [place] Phone: [phone] Mobile Phone: [phone] Relation: Son  Code Status:  DNR Goals of care: Advanced Directive information Advanced Directives 10/19/2016  Does Patient Have a Medical Advance Directive? Yes  Type of Advance Directive Out of facility DNR (pink MOST or yellow form);Living will  Does patient want to make changes to medical advance directive? -  Copy of [place] in Chart? -  Pre-existing out of facility DNR order (yellow form or pink MOST form) -     Chief Complaint  Patient presents with  . Acute Visit    Fx to (rt) ring finger    HPI:  Pt is a 83 y.o. female seen today for an acute visit for 10/16/16 open nondisplaced fracture of proximal phalanx of right ring finger, sutures x3, splint was applied, sustained from her right hand caught between the seat and the ars to the arm rail while in w/c to dinning room, Keflex 500mg  bid x 7days while in ED, will f/u Ortho. Prn Naproxen and Tylenol available to her.   Hx of COPD, but stable, no taking maintenance therapy, not O2 dependent, completed  7 day course of Avelox, stabilized.   Past Medical History:  Diagnosis Date  . Abnormality of gait 04/28/2010   History of fall onto right shoulder-has participated in PT. Uses  walker.     . Acute encephalopathy 10/25/14   Occurred when septic  . Acute lower GI bleeding 10/30/2014  . Adjustment disorder with mixed anxiety and depressed mood 06/04/2015   11/15/15 Hgb 12.8, Na 139, K 4.2, Bun 14, creat 0.68, TP 5.7, albumin 3.5, TSH 1.54, Hgb a1c 4.4   . Anemia   . Aortic atherosclerosis ([place]) 10/27/2014  . Asthma   . Cerebral atrophy 10/27/2014  . Cerebrovascular disease 10/27/2014   Small vessel disease  . Cervical disc disease   . Chronic lower back pain 06/04/2015   lumbar spondylosis. Regular f/u Dr. French Ana. Receives injections last 01/2013.  05/27/15 Xray left hip/pelvis: spondylotic changes, severe osteoarthritis Continue Tylenol and Tramadol prn for pain, observe the patient, adding Cymbalta may help. Mid to lower back pain more on the right side back, may consider X-ray of thoracic spine. 06/01/15 CT thoracic spine showed no acute changes.  Patient received 10 injection in the back through Dr. Alvester Morin office. She says the previous problems of discomfort in the back and down the leg have completely resolved. Takes Aleve 220mg  bid   . Debility 06/20/2015   11/15/15 Hgb 12.8, Na 139, K 4.2, Bun 14, creat 0.68, TP 5.7, albumin 3.5, TSH 1.54, Hgb a1c 4.4   . Degenerative disc disease, lumbar 10/27/2014  . Depression   . Dermatitis 03/25/2016  . Diarrhea 10/25/14  . Diverticulosis 10/27/2014  . Essential hypertension 09/20/2006   11/15/15 Hgb 12.8, Na 139, K 4.2, Bun  14, creat 0.68, TP 5.7, albumin 3.5, TSH 1.54, Hgb a1c 4.4   . Fall at nursing home 10/25/2014  . GERD (gastroesophageal reflux disease)   . History of cardiovascular disorder 09/20/2006   X 3 in 2000 now on aspirin.    Marland Kitchen Hyperlipidemia 09/20/2006   05/15/14 LDL 95   . Hypertension   . Lumbosacral spondylosis 10/28/2009  . OSTEOPOROSIS 09/20/2006   Noted 2008    . Overactive bladder 11/29/2013   Myrbetriq prescribed by urology.    . Protein calorie malnutrition ([place])    06/06/15 TP 6.0 albumin 2.9  . SCOLIOSIS  10/28/2009  . Scoliosis 10/27/2014  . Thrombocytosis ([place]) 05/31/2015   06/10/15 plt 602 07/23/15 plt 335 11/15/15 Hgb 12.8, Na 139, K 4.2, Bun 14, creat 0.68, TP 5.7, albumin 3.5, TSH 1.54, Hgb a1c 4.4  . TIA (transient ischemic attack) 09/20/2006   Past Surgical History:  Procedure Laterality Date  . ABDOMINAL HYSTERECTOMY    . APPENDECTOMY    . CHOLECYSTECTOMY    . TONSILLECTOMY      Allergies  Allergen Reactions  . Tramadol Other (See Comments)    Hallucinations, agitation/combativeness  . Amoxicillin     Per MAR  . Oxycodone Other (See Comments)    Per MAR   . Sulfamethoxazole     Per MAR   . Penicillins Rash    Outpatient Encounter Prescriptions as of 10/19/2016  Medication Sig  . acetaminophen (TYLENOL) 325 MG tablet Take 650 mg by mouth every 4 (four) hours as needed for moderate pain or headache.  . albuterol (PROVENTIL HFA;VENTOLIN HFA) 108 (90 BASE) MCG/ACT inhaler Inhale 2 puffs into the lungs every 6 (six) hours as needed for wheezing or shortness of breath.  Marland Kitchen alum & mag hydroxide-simeth (MAALOX/MYLANTA) 200-200-20 MG/5ML suspension Take 30 mLs by mouth every 6 (six) hours as needed for indigestion or heartburn.  Marland Kitchen amLODipine (NORVASC) 5 MG tablet Take 5 mg by mouth daily with breakfast.   . ipratropium-albuterol (DUONEB) 0.5-2.5 (3) MG/3ML SOLN Take 3 mLs by nebulization 3 (three) times daily.  Marland Kitchen loperamide (IMODIUM) 2 MG capsule Take 2 mg by mouth daily as needed for diarrhea or loose stools. Take one to two tablets by mouth as needed for diarrhea, Max 16 mg/24 hours call MD if Diarrhea continues  . losartan (COZAAR) 100 MG tablet Take 100 mg by mouth daily with breakfast.   . Mirabegron ER (MYRBETRIQ) 25 MG TB24 Take 25 mg by mouth daily with breakfast. Prescribed by urology  . mirtazapine (REMERON) 7.5 MG tablet Take 7.5 mg by mouth at bedtime.  . naproxen sodium (ANAPROX) 220 MG tablet Take 220 mg by mouth 2 (two) times daily as needed. Pain  . nitroGLYCERIN  (NITROSTAT) 0.4 MG SL tablet Place 0.4 mg under the tongue every 5 (five) minutes as needed for chest pain. After taking three times and no relief call MD.  . omeprazole (PRILOSEC) 20 MG capsule Take 20 mg by mouth daily.  . polyethylene glycol (MIRALAX / GLYCOLAX) packet Take 17 g by mouth daily as needed for moderate constipation.   Marland Kitchen pyridOXINE (VITAMIN B-6) 100 MG tablet Take 100 mg by mouth daily with breakfast.   . [DISCONTINUED] cephALEXin (KEFLEX) 500 MG capsule Take 1 capsule (500 mg total) by mouth 2 (two) times daily.  . [DISCONTINUED] moxifloxacin (AVELOX) 400 MG tablet Take 400 mg by mouth daily at 8 pm. Pt was on a 7 day course. Pt took last dose on 10/13/16   Facility-Administered  Encounter Medications as of 10/19/2016  Medication  . 0.9 %  sodium chloride infusion    Review of Systems  Constitutional: Negative for chills, diaphoresis and fever.  HENT: Positive for hearing loss. Negative for congestion, ear discharge, ear pain, nosebleeds, sore throat and tinnitus.   Eyes: Negative for photophobia, pain, discharge and redness.  Respiratory: Positive for cough. Negative for shortness of breath, wheezing and stridor.        DOE  Cardiovascular: Positive for leg swelling. Negative for chest pain and palpitations.       Trace edema BLE R>L  Gastrointestinal: Negative for abdominal pain, blood in stool, constipation, diarrhea (Under control with Questran and Imodium), nausea and vomiting.  Endocrine: Negative for polydipsia.  Genitourinary: Positive for frequency. Negative for dysuria, flank pain, hematuria and urgency.       1-2x/night. Stress incontinence  Musculoskeletal: Positive for arthralgias and gait problem. Negative for back pain, myalgias and neck pain.       Resolution of Left lower back, left hip, left thigh pain with weight bearing and position changes Chest pain was relieved after Mylanta 05/28/15 Neck pain 05/29/15 10/19/16 open nondisplaced fracture of proximal phalanx  of right ring finger  Allergic/Immunologic: Negative for environmental allergies.  Neurological: Negative for dizziness, tremors, seizures, weakness and headaches.       Memory loss.  Hematological: Does not bruise/bleed easily.       Resolved anemia secondary to acute blood loss from lower GI bleeding. Plt count has returned to normal.  Psychiatric/Behavioral: Negative for dysphoric mood.    Immunization History  Administered Date(s) Administered  . HiB (PRP-OMP) 02/12/2004  . Influenza Split 01/11/2012  . Influenza Whole 01/07/2009  . Influenza-Unspecified 01/11/2014, 01/01/2015, 01/29/2016  . PPD Test 11/14/2014, 06/03/2015, 06/17/2015  . Pneumococcal Polysaccharide-23 04/28/2010  . Tdap 08/24/2011  . Zoster 04/13/2010   Pertinent  Health Maintenance Due  Topic Date Due  . DEXA SCAN  01/28/1991  . PNA vac Low Risk Adult (2 of 2 - PCV13) 04/29/2011  . INFLUENZA VACCINE  11/11/2016   Fall Risk  06/20/2015 05/23/2015 11/09/2014 05/10/2014  Falls in the past year? No No No No   Functional Status Survey:    Vitals:   10/19/16 1347  BP: 120/60  Pulse: 70  Resp: 18  Temp: 98.7 F (37.1 C)  Weight: 153 lb 6.4 oz (69.6 kg)  Height: 5\' 5"  (1.651 m)   Body mass index is 25.53 kg/m. Physical Exam  Constitutional: She is oriented to person, place, and time. She appears well-developed and well-nourished. No distress.  HENT:  Head: Normocephalic and atraumatic.  Right Ear: No lacerations. No drainage. No foreign bodies. No mastoid tenderness. Tympanic membrane is not injected, not scarred, not perforated, not erythematous, not retracted and not bulging. Tympanic membrane mobility is abnormal. No middle ear effusion. Decreased hearing is noted.  Left Ear: No lacerations. No drainage. No foreign bodies. No mastoid tenderness. Tympanic membrane is not injected, not scarred, not perforated, not erythematous, not retracted and not bulging. Tympanic membrane mobility is abnormal.  No middle  ear effusion. Decreased hearing is noted.  Nose: Nose normal.  Mouth/Throat: Oropharynx is clear and moist. No oropharyngeal exudate.  Eyes: Conjunctivae and EOM are normal. Pupils are equal, round, and reactive to light. Right eye exhibits no discharge. Left eye exhibits no discharge. No scleral icterus.  Neck: Normal range of motion. Neck supple. No JVD present. No tracheal deviation present. No thyromegaly present.  Cardiovascular: Normal rate, regular rhythm, normal  heart sounds and intact distal pulses.  Exam reveals no gallop and no friction rub.   No murmur heard. Pulmonary/Chest: Effort normal. No stridor. No respiratory distress. She has no wheezes. She has rales. She exhibits no tenderness.  Noted DOE  Abdominal: Soft. Bowel sounds are normal. She exhibits no distension and no mass. There is no tenderness. There is no rebound and no guarding.  Genitourinary: Rectal exam shows guaiac negative stool.  Musculoskeletal: She exhibits edema and tenderness.  Resolved Left lower back, left hip, left thigh pain with weight bearing and position changes Chest pain was relieved after Mylanta 05/28/15 Neck pain 05/29/15 BLE trace edema 10/19/16 open nondisplaced fracture of proximal phalanx of right ring finger, suture closure x3, splint   Lymphadenopathy:    She has no cervical adenopathy.  Neurological: She is alert and oriented to person, place, and time. She has normal reflexes. No cranial nerve deficit. She exhibits normal muscle tone. Coordination normal.  Skin: Skin is warm and dry. No rash noted. She is not diaphoretic. No erythema. No pallor.  Lots of moles. Red spots arms and legs. 08/31/16 more in a group of a palm size, non itching or pain.   Psychiatric: Her behavior is normal. Thought content normal.    Labs reviewed:  Recent Labs  05/14/16 09/01/16 10/08/16  NA 139 137 141  K 4.5 4.0 4.3  BUN 14 14 13   CREATININE 0.7 0.7 1.0    Recent Labs  05/14/16 09/01/16 10/08/16  AST  19 17 23   ALT 17 16 22   ALKPHOS 64 66 57    Recent Labs  08/04/16 09/01/16 10/08/16  WBC 8.0 5.3 7.0  HGB 13.6 12.8 13.9  HCT 39 37 39  PLT 416* 378 375   Lab Results  Component Value Date   TSH 1.42 05/14/2016   Lab Results  Component Value Date   HGBA1C 4.4 11/14/2015   Lab Results  Component Value Date   CHOL 176 05/15/2014   HDL 62 05/15/2014   LDLCALC 95 05/15/2014   LDLDIRECT 114.3 12/08/2007   TRIG 93 05/15/2014   CHOLHDL 2.9 CALC 12/08/2007    Significant Diagnostic Results in last 30 days:  Dg Finger Ring Right  Result Date: 10/16/2016 CLINICAL DATA:  Laceration after finger caught in wheelchair EXAM: RIGHT FOURTH FINGER 2+V COMPARISON:  None. FINDINGS: Frontal, oblique, and lateral views were obtained. There are fractures of the mid the distal aspects of the fourth proximal phalanx with alignment near anatomic at fracture sites. There is soft tissue swelling in this area. No other fractures. No dislocations. There is slight narrowing of the fourth PIP and DIP joints. IMPRESSION: Fractures of the mid the distal aspects of the fourth proximal phalanx with soft tissue swelling. Alignment near anatomic. No other fractures. No dislocation. Mild narrowing of the PIP and DIP joints of the fourth digit. Electronically Signed   By: Lowella Grip III M.D.   On: 10/16/2016 19:44    Assessment/Plan Open nondisplaced fracture of proximal phalanx of right ring finger 10/16/16 open nondisplaced fracture of proximal phalanx of right ring finger, sustained from her right hand caught between the seat and the ars to the arm rail while in w/c to dinning room, Keflex 500mg  bid x 7days while in ED, will f/u Ortho, monitor suture closure x3, continue finger splint.    Acute bronchitis with COPD ([place]) Fully treated, resolved wheezed, she is at her baseline occasional hacking cough and DOE     Family/ staff Communication:  AL f/u Ortho.   Labs/tests ordered: none

## 2016-10-19 NOTE — Assessment & Plan Note (Signed)
Fully treated, resolved wheezed, she is at her baseline occasional hacking cough and DOE

## 2016-10-21 DIAGNOSIS — H81399 Other peripheral vertigo, unspecified ear: Secondary | ICD-10-CM | POA: Diagnosis not present

## 2016-10-21 DIAGNOSIS — S61204A Unspecified open wound of right ring finger without damage to nail, initial encounter: Secondary | ICD-10-CM | POA: Diagnosis not present

## 2016-10-21 DIAGNOSIS — K2971 Gastritis, unspecified, with bleeding: Secondary | ICD-10-CM | POA: Diagnosis not present

## 2016-10-21 DIAGNOSIS — M25552 Pain in left hip: Secondary | ICD-10-CM | POA: Diagnosis not present

## 2016-10-21 DIAGNOSIS — S62644A Nondisplaced fracture of proximal phalanx of right ring finger, initial encounter for closed fracture: Secondary | ICD-10-CM | POA: Diagnosis not present

## 2016-10-21 DIAGNOSIS — M6281 Muscle weakness (generalized): Secondary | ICD-10-CM | POA: Diagnosis not present

## 2016-10-21 DIAGNOSIS — R2681 Unsteadiness on feet: Secondary | ICD-10-CM | POA: Diagnosis not present

## 2016-10-21 DIAGNOSIS — R2689 Other abnormalities of gait and mobility: Secondary | ICD-10-CM | POA: Diagnosis not present

## 2016-10-23 ENCOUNTER — Non-Acute Institutional Stay: Payer: Medicare Other | Admitting: Nurse Practitioner

## 2016-10-23 DIAGNOSIS — H1031 Unspecified acute conjunctivitis, right eye: Secondary | ICD-10-CM | POA: Diagnosis not present

## 2016-10-23 DIAGNOSIS — H81399 Other peripheral vertigo, unspecified ear: Secondary | ICD-10-CM | POA: Diagnosis not present

## 2016-10-23 DIAGNOSIS — K2971 Gastritis, unspecified, with bleeding: Secondary | ICD-10-CM | POA: Diagnosis not present

## 2016-10-23 DIAGNOSIS — I10 Essential (primary) hypertension: Secondary | ICD-10-CM

## 2016-10-23 DIAGNOSIS — H109 Unspecified conjunctivitis: Secondary | ICD-10-CM | POA: Insufficient documentation

## 2016-10-23 DIAGNOSIS — R2681 Unsteadiness on feet: Secondary | ICD-10-CM | POA: Diagnosis not present

## 2016-10-23 DIAGNOSIS — R2689 Other abnormalities of gait and mobility: Secondary | ICD-10-CM | POA: Diagnosis not present

## 2016-10-23 DIAGNOSIS — M25552 Pain in left hip: Secondary | ICD-10-CM | POA: Diagnosis not present

## 2016-10-23 DIAGNOSIS — M6281 Muscle weakness (generalized): Secondary | ICD-10-CM | POA: Diagnosis not present

## 2016-10-23 NOTE — Progress Notes (Signed)
Location:      Place of Service:    Provider:  Raijon Lindfors, Manxie  NP   Blanchie Serve, MD  Patient Care Team: Blanchie Serve, MD as PCP - General (Internal Medicine) Abiageal Blowe X, NP as Nurse Practitioner (Nurse Practitioner)  Extended Emergency Contact Information Primary Emergency Contact: [name] Address: [place], [place] [address] [name] of [place] Phone: [phone] Work Phone: [phone] Mobile Phone: [phone] Relation: Daughter Secondary Emergency Contact: [name] Address: [address] [place] [address] [name] of [place] Phone: [phone] Mobile Phone: [phone] Relation: Son  Code Status:  DNR Goals of care: Advanced Directive information Advanced Directives 10/19/2016  Does Patient Have a Medical Advance Directive? Yes  Type of Advance Directive Out of facility DNR (pink MOST or yellow form);Living will  Does patient want to make changes to medical advance directive? -  Copy of [place] in Chart? -  Pre-existing out of facility DNR order (yellow form or pink MOST form) -     No chief complaint on file.   HPI:  Pt is a 83 y.o. female seen today for an acute visit for injected right eye, denied itching, pain, or vision changes    Hx of HTN blood pressure is controlled on Losartan 100mg  qd, Amlodipine 5mg  po qd  Past Medical History:  Diagnosis Date  . Abnormality of gait 04/28/2010   History of fall onto right shoulder-has participated in PT. Uses walker.     . Acute encephalopathy 10/25/14   Occurred when septic  . Acute lower GI bleeding 10/30/2014  . Adjustment disorder with mixed anxiety and depressed mood 06/04/2015   11/15/15 Hgb 12.8, Na 139, K 4.2, Bun 14, creat 0.68, TP 5.7, albumin 3.5, TSH 1.54, Hgb a1c 4.4   . Anemia   . Aortic atherosclerosis ([place]) 10/27/2014  . Asthma   . Cerebral atrophy 10/27/2014  . Cerebrovascular disease  10/27/2014   Small vessel disease  . Cervical disc disease   . Chronic lower back pain 06/04/2015   lumbar spondylosis. Regular f/u Dr. French Ana. Receives injections last 01/2013.  05/27/15 Xray left hip/pelvis: spondylotic changes, severe osteoarthritis Continue Tylenol and Tramadol prn for pain, observe the patient, adding Cymbalta may help. Mid to lower back pain more on the right side back, may consider X-ray of thoracic spine. 06/01/15 CT thoracic spine showed no acute changes.  Patient received 10 injection in the back through Dr. Alvester Morin office. She says the previous problems of discomfort in the back and down the leg have completely resolved. Takes Aleve 220mg  bid   . Debility 06/20/2015   11/15/15 Hgb 12.8, Na 139, K 4.2, Bun 14, creat 0.68, TP 5.7, albumin 3.5, TSH 1.54, Hgb a1c 4.4   . Degenerative disc disease, lumbar 10/27/2014  . Depression   . Dermatitis 03/25/2016  . Diarrhea 10/25/14  . Diverticulosis 10/27/2014  . Essential hypertension 09/20/2006   11/15/15 Hgb 12.8, Na 139, K 4.2, Bun 14, creat 0.68, TP 5.7, albumin 3.5, TSH 1.54, Hgb a1c 4.4   . Fall at nursing home 10/25/2014  . GERD (gastroesophageal reflux disease)   . History of cardiovascular disorder 09/20/2006   X 3 in 2000 now on aspirin.    Marland Kitchen Hyperlipidemia 09/20/2006   05/15/14 LDL 95   . Hypertension   . Lumbosacral spondylosis 10/28/2009  . OSTEOPOROSIS 09/20/2006  Noted 2008    . Overactive bladder 11/29/2013   Myrbetriq prescribed by urology.    . Protein calorie malnutrition ([place])    06/06/15 TP 6.0 albumin 2.9  . SCOLIOSIS 10/28/2009  . Scoliosis 10/27/2014  . Thrombocytosis ([place]) 05/31/2015   06/10/15 plt 602 07/23/15 plt 335 11/15/15 Hgb 12.8, Na 139, K 4.2, Bun 14, creat 0.68, TP 5.7, albumin 3.5, TSH 1.54, Hgb a1c 4.4  . TIA (transient ischemic attack) 09/20/2006   Past Surgical History:  Procedure Laterality Date  . ABDOMINAL HYSTERECTOMY    . APPENDECTOMY    . CHOLECYSTECTOMY    . TONSILLECTOMY      Allergies    Allergen Reactions  . Tramadol Other (See Comments)    Hallucinations, agitation/combativeness  . Amoxicillin     Per MAR  . Oxycodone Other (See Comments)    Per MAR   . Sulfamethoxazole     Per MAR   . Penicillins Rash    Outpatient Encounter Prescriptions as of 10/23/2016  Medication Sig  . acetaminophen (TYLENOL) 325 MG tablet Take 650 mg by mouth every 4 (four) hours as needed for moderate pain or headache.  . albuterol (PROVENTIL HFA;VENTOLIN HFA) 108 (90 BASE) MCG/ACT inhaler Inhale 2 puffs into the lungs every 6 (six) hours as needed for wheezing or shortness of breath.  Marland Kitchen alum & mag hydroxide-simeth (MAALOX/MYLANTA) 200-200-20 MG/5ML suspension Take 30 mLs by mouth every 6 (six) hours as needed for indigestion or heartburn.  Marland Kitchen amLODipine (NORVASC) 5 MG tablet Take 5 mg by mouth daily with breakfast.   . ipratropium-albuterol (DUONEB) 0.5-2.5 (3) MG/3ML SOLN Take 3 mLs by nebulization 3 (three) times daily.  Marland Kitchen loperamide (IMODIUM) 2 MG capsule Take 2 mg by mouth daily as needed for diarrhea or loose stools. Take one to two tablets by mouth as needed for diarrhea, Max 16 mg/24 hours call MD if Diarrhea continues  . losartan (COZAAR) 100 MG tablet Take 100 mg by mouth daily with breakfast.   . Mirabegron ER (MYRBETRIQ) 25 MG TB24 Take 25 mg by mouth daily with breakfast. Prescribed by urology  . mirtazapine (REMERON) 7.5 MG tablet Take 7.5 mg by mouth at bedtime.  . naproxen sodium (ANAPROX) 220 MG tablet Take 220 mg by mouth 2 (two) times daily as needed. Pain  . nitroGLYCERIN (NITROSTAT) 0.4 MG SL tablet Place 0.4 mg under the tongue every 5 (five) minutes as needed for chest pain. After taking three times and no relief call MD.  . omeprazole (PRILOSEC) 20 MG capsule Take 20 mg by mouth daily.  . polyethylene glycol (MIRALAX / GLYCOLAX) packet Take 17 g by mouth daily as needed for moderate constipation.   Marland Kitchen pyridOXINE (VITAMIN B-6) 100 MG tablet Take 100 mg by mouth daily with  breakfast.    Facility-Administered Encounter Medications as of 10/23/2016  Medication  . 0.9 %  sodium chloride infusion    Review of Systems  Constitutional: Negative for chills, diaphoresis and fever.  HENT: Positive for hearing loss. Negative for congestion, ear discharge, ear pain, nosebleeds, sore throat and tinnitus.   Eyes: Positive for redness. Negative for photophobia, pain and discharge.       The right eye injected  Respiratory: Positive for cough. Negative for shortness of breath, wheezing and stridor.        DOE  Cardiovascular: Positive for leg swelling. Negative for chest pain and palpitations.       Trace edema BLE R>L  Gastrointestinal: Negative for abdominal pain, blood  in stool, constipation, diarrhea (Under control with Questran and Imodium), nausea and vomiting.  Endocrine: Negative for polydipsia.  Genitourinary: Positive for frequency. Negative for dysuria, flank pain, hematuria and urgency.       1-2x/night. Stress incontinence  Musculoskeletal: Positive for arthralgias and gait problem. Negative for back pain, myalgias and neck pain.       Resolution of Left lower back, left hip, left thigh pain with weight bearing and position changes Chest pain was relieved after Mylanta 05/28/15 Neck pain 05/29/15 10/19/16 open nondisplaced fracture of proximal phalanx of right ring finger  Allergic/Immunologic: Negative for environmental allergies.  Neurological: Negative for dizziness, tremors, seizures, weakness and headaches.       Memory loss.  Hematological: Does not bruise/bleed easily.       Resolved anemia secondary to acute blood loss from lower GI bleeding. Plt count has returned to normal.  Psychiatric/Behavioral: Negative for dysphoric mood.    Immunization History  Administered Date(s) Administered  . HiB (PRP-OMP) 02/12/2004  . Influenza Split 01/11/2012  . Influenza Whole 01/07/2009  . Influenza-Unspecified 01/11/2014, 01/01/2015, 01/29/2016  . PPD Test  11/14/2014, 06/03/2015, 06/17/2015  . Pneumococcal Polysaccharide-23 04/28/2010  . Tdap 08/24/2011  . Zoster 04/13/2010   Pertinent  Health Maintenance Due  Topic Date Due  . DEXA SCAN  01/28/1991  . PNA vac Low Risk Adult (2 of 2 - PCV13) 04/29/2011  . INFLUENZA VACCINE  11/11/2016   Fall Risk  06/20/2015 05/23/2015 11/09/2014 05/10/2014  Falls in the past year? No No No No   Functional Status Survey:    There were no vitals filed for this visit. There is no height or weight on file to calculate BMI. Physical Exam  Constitutional: She is oriented to person, place, and time. She appears well-developed and well-nourished. No distress.  HENT:  Head: Normocephalic and atraumatic.  Right Ear: No lacerations. No drainage. No foreign bodies. No mastoid tenderness. Tympanic membrane is not injected, not scarred, not perforated, not erythematous, not retracted and not bulging. Tympanic membrane mobility is abnormal. No middle ear effusion. Decreased hearing is noted.  Left Ear: No lacerations. No drainage. No foreign bodies. No mastoid tenderness. Tympanic membrane is not injected, not scarred, not perforated, not erythematous, not retracted and not bulging. Tympanic membrane mobility is abnormal.  No middle ear effusion. Decreased hearing is noted.  Nose: Nose normal.  Mouth/Throat: Oropharynx is clear and moist. No oropharyngeal exudate.  Eyes: Pupils are equal, round, and reactive to light. EOM are normal. Right eye exhibits no discharge. Left eye exhibits no discharge. No scleral icterus.  Injected right eye, mid tearing noted.   Neck: Normal range of motion. Neck supple. No JVD present. No tracheal deviation present. No thyromegaly present.  Cardiovascular: Normal rate, regular rhythm, normal heart sounds and intact distal pulses.  Exam reveals no gallop and no friction rub.   No murmur heard. Pulmonary/Chest: Effort normal. No stridor. No respiratory distress. She has no wheezes. She has  rales. She exhibits no tenderness.  Noted DOE  Abdominal: Soft. Bowel sounds are normal. She exhibits no distension and no mass. There is no tenderness. There is no rebound and no guarding.  Genitourinary: Rectal exam shows guaiac negative stool.  Musculoskeletal: She exhibits edema and tenderness.  Resolved Left lower back, left hip, left thigh pain with weight bearing and position changes Chest pain was relieved after Mylanta 05/28/15 Neck pain 05/29/15 BLE trace edema 10/19/16 open nondisplaced fracture of proximal phalanx of right ring finger, suture closure  x3, splint   Lymphadenopathy:    She has no cervical adenopathy.  Neurological: She is alert and oriented to person, place, and time. She has normal reflexes. No cranial nerve deficit. She exhibits normal muscle tone. Coordination normal.  Skin: Skin is warm and dry. No rash noted. She is not diaphoretic. No erythema. No pallor.  Lots of moles. Red spots arms and legs. 08/31/16 more in a group of a palm size, non itching or pain.   Psychiatric: Her behavior is normal. Thought content normal.    Labs reviewed:  Recent Labs  05/14/16 09/01/16 10/08/16  NA 139 137 141  K 4.5 4.0 4.3  BUN 14 14 13   CREATININE 0.7 0.7 1.0    Recent Labs  05/14/16 09/01/16 10/08/16  AST 19 17 23   ALT 17 16 22   ALKPHOS 64 66 57    Recent Labs  08/04/16 09/01/16 10/08/16  WBC 8.0 5.3 7.0  HGB 13.6 12.8 13.9  HCT 39 37 39  PLT 416* 378 375   Lab Results  Component Value Date   TSH 1.42 05/14/2016   Lab Results  Component Value Date   HGBA1C 4.4 11/14/2015   Lab Results  Component Value Date   CHOL 176 05/15/2014   HDL 62 05/15/2014   LDLCALC 95 05/15/2014   LDLDIRECT 114.3 12/08/2007   TRIG 93 05/15/2014   CHOLHDL 2.9 CALC 12/08/2007    Significant Diagnostic Results in last 30 days:  Dg Finger Ring Right  Result Date: 10/16/2016 CLINICAL DATA:  Laceration after finger caught in wheelchair EXAM: RIGHT FOURTH FINGER 2+V  COMPARISON:  None. FINDINGS: Frontal, oblique, and lateral views were obtained. There are fractures of the mid the distal aspects of the fourth proximal phalanx with alignment near anatomic at fracture sites. There is soft tissue swelling in this area. No other fractures. No dislocations. There is slight narrowing of the fourth PIP and DIP joints. IMPRESSION: Fractures of the mid the distal aspects of the fourth proximal phalanx with soft tissue swelling. Alignment near anatomic. No other fractures. No dislocation. Mild narrowing of the PIP and DIP joints of the fourth digit. Electronically Signed   By: Lowella Grip III M.D.   On: 10/16/2016 19:44    Assessment/Plan Conjunctivitis of right eye Naphcon A I gtt bid to the right eye x 7 days, observe.   Essential hypertension Controlled, continue Amlodipine 5mg , Losartan 100mg , 10/08/16 Na 141, K 4.3, Bun 13, creat 0.98, wbc 7.0, Hgb 13.9, plt 375     Family/ staff Communication: AL f/u Ortho.   Labs/tests ordered: none

## 2016-10-23 NOTE — Assessment & Plan Note (Signed)
Naphcon A I gtt bid to the right eye x 7 days, observe.

## 2016-10-23 NOTE — Assessment & Plan Note (Signed)
Controlled, continue Amlodipine 5mg , Losartan 100mg , 10/08/16 Na 141, K 4.3, Bun 13, creat 0.98, wbc 7.0, Hgb 13.9, plt 375

## 2016-10-27 DIAGNOSIS — R2681 Unsteadiness on feet: Secondary | ICD-10-CM | POA: Diagnosis not present

## 2016-10-27 DIAGNOSIS — K2971 Gastritis, unspecified, with bleeding: Secondary | ICD-10-CM | POA: Diagnosis not present

## 2016-10-27 DIAGNOSIS — H81399 Other peripheral vertigo, unspecified ear: Secondary | ICD-10-CM | POA: Diagnosis not present

## 2016-10-27 DIAGNOSIS — R2689 Other abnormalities of gait and mobility: Secondary | ICD-10-CM | POA: Diagnosis not present

## 2016-10-27 DIAGNOSIS — M25552 Pain in left hip: Secondary | ICD-10-CM | POA: Diagnosis not present

## 2016-10-27 DIAGNOSIS — M6281 Muscle weakness (generalized): Secondary | ICD-10-CM | POA: Diagnosis not present

## 2016-10-29 DIAGNOSIS — R2689 Other abnormalities of gait and mobility: Secondary | ICD-10-CM | POA: Diagnosis not present

## 2016-10-29 DIAGNOSIS — R2681 Unsteadiness on feet: Secondary | ICD-10-CM | POA: Diagnosis not present

## 2016-10-29 DIAGNOSIS — M25552 Pain in left hip: Secondary | ICD-10-CM | POA: Diagnosis not present

## 2016-10-29 DIAGNOSIS — K2971 Gastritis, unspecified, with bleeding: Secondary | ICD-10-CM | POA: Diagnosis not present

## 2016-10-29 DIAGNOSIS — M6281 Muscle weakness (generalized): Secondary | ICD-10-CM | POA: Diagnosis not present

## 2016-10-29 DIAGNOSIS — H81399 Other peripheral vertigo, unspecified ear: Secondary | ICD-10-CM | POA: Diagnosis not present

## 2016-10-30 ENCOUNTER — Non-Acute Institutional Stay: Payer: Medicare Other | Admitting: Nurse Practitioner

## 2016-10-30 ENCOUNTER — Encounter: Payer: Self-pay | Admitting: Nurse Practitioner

## 2016-10-30 DIAGNOSIS — R2681 Unsteadiness on feet: Secondary | ICD-10-CM | POA: Diagnosis not present

## 2016-10-30 DIAGNOSIS — J209 Acute bronchitis, unspecified: Secondary | ICD-10-CM | POA: Diagnosis not present

## 2016-10-30 DIAGNOSIS — H1031 Unspecified acute conjunctivitis, right eye: Secondary | ICD-10-CM

## 2016-10-30 DIAGNOSIS — K2971 Gastritis, unspecified, with bleeding: Secondary | ICD-10-CM | POA: Diagnosis not present

## 2016-10-30 DIAGNOSIS — R2689 Other abnormalities of gait and mobility: Secondary | ICD-10-CM | POA: Diagnosis not present

## 2016-10-30 DIAGNOSIS — M6281 Muscle weakness (generalized): Secondary | ICD-10-CM | POA: Diagnosis not present

## 2016-10-30 DIAGNOSIS — H81399 Other peripheral vertigo, unspecified ear: Secondary | ICD-10-CM | POA: Diagnosis not present

## 2016-10-30 DIAGNOSIS — J440 Chronic obstructive pulmonary disease with acute lower respiratory infection: Secondary | ICD-10-CM

## 2016-10-30 DIAGNOSIS — M25552 Pain in left hip: Secondary | ICD-10-CM | POA: Diagnosis not present

## 2016-10-30 DIAGNOSIS — K219 Gastro-esophageal reflux disease without esophagitis: Secondary | ICD-10-CM | POA: Diagnosis not present

## 2016-10-30 NOTE — Assessment & Plan Note (Signed)
Fully treated, resolved wheezed, c/o occasional hacking cough, scant yellow phlegm, DOE, most likely after infection cough, will try Medrol dose pk. Observe the patient.

## 2016-10-30 NOTE — Assessment & Plan Note (Signed)
persisted redness in the right eye, c/o scratchy sensation, no change of vision, noted small amount of light yellow drainage, didn't respond to Naphcon A ophthalmic sol Trial of Cipro ophthalmic sol 1 gtt right eye q2h x 2 days, then q4h x 5 days while awake.

## 2016-10-30 NOTE — Assessment & Plan Note (Signed)
Stable, continue prn Mylanta, Omeprazole 20mg , ? Contributory to cough, observe.

## 2016-10-30 NOTE — Progress Notes (Signed)
Location:  [facility] Room Number: [place]:  SNF (31) Provider:  Floella Ensz, [place] NP  Blanchie Serve, MD  Patient Care Team: Blanchie Serve, MD as PCP - General (Internal Medicine) Jandi Swiger X, NP as Nurse Practitioner (Nurse Practitioner)  Extended Emergency Contact Information Primary Emergency Contact: [name] Address: [place], [place] [address] [name] of [place] Phone: [phone] Work Phone: [phone] Mobile Phone: [phone] Relation: Daughter Secondary Emergency Contact: [name] Address: [address] [place] [address] [name] of [place] Phone: [phone] Mobile Phone: [phone] Relation: Son  Code Status: DNR Goals of care: Advanced Directive information Advanced Directives 10/30/2016  Does Patient Have a Medical Advance Directive? Yes  Type of Advance Directive Out of facility DNR (pink MOST or yellow form);Living will;Healthcare Power of Attorney  Does patient want to make changes to medical advance directive? No - Patient declined  Copy of [place] in Chart? Yes  Pre-existing out of facility DNR order (yellow form or pink MOST form) Yellow form placed in chart (order not valid for inpatient use)     Chief Complaint  Patient presents with  . Acute Visit    increased redness to (RT) eye    HPI:  Pt is a 83 y.o. female seen today for an acute visit for persisted redness in the right eye, c/o scratchy sensation, no change of vision, noted small amount of light yellow drainage, didn't respond to Naphcon A ophthalmic sol  C/o persisted cough, scant yellow phlegm occasionally, afebrile, no O2 desaturation, denied chest pain, palpitation, dizziness, paroxysmal nocturnal dyspnea.   Hx of GERD, taking Omeprazole 20mg  daily. Denied heart burns, indigestion     Past Medical History:  Diagnosis Date  . Abnormality of  gait 04/28/2010   History of fall onto right shoulder-has participated in PT. Uses walker.     . Acute encephalopathy 10/25/14   Occurred when septic  . Acute lower GI bleeding 10/30/2014  . Adjustment disorder with mixed anxiety and depressed mood 06/04/2015   11/15/15 Hgb 12.8, Na 139, K 4.2, Bun 14, creat 0.68, TP 5.7, albumin 3.5, TSH 1.54, Hgb a1c 4.4   . Anemia   . Aortic atherosclerosis ([place]) 10/27/2014  . Asthma   . Cerebral atrophy 10/27/2014  . Cerebrovascular disease 10/27/2014   Small vessel disease  . Cervical disc disease   . Chronic lower back pain 06/04/2015   lumbar spondylosis. Regular f/u Dr. French Ana. Receives injections last 01/2013.  05/27/15 Xray left hip/pelvis: spondylotic changes, severe osteoarthritis Continue Tylenol and Tramadol prn for pain, observe the patient, adding Cymbalta may help. Mid to lower back pain more on the right side back, may consider X-ray of thoracic spine. 06/01/15 CT thoracic spine showed no acute changes.  Patient received 10 injection in the back through Dr. Alvester Morin office. She says the previous problems of discomfort in the back and down the leg have completely resolved. Takes Aleve 220mg  bid   . Debility 06/20/2015   11/15/15 Hgb 12.8, Na 139, K 4.2, Bun 14, creat 0.68, TP 5.7, albumin 3.5, TSH 1.54, Hgb a1c 4.4   . Degenerative disc disease, lumbar 10/27/2014  . Depression   . Dermatitis 03/25/2016  . Diarrhea 10/25/14  . Diverticulosis 10/27/2014  . Essential hypertension 09/20/2006   11/15/15 Hgb 12.8, Na 139, K 4.2, Bun 14, creat 0.68,  TP 5.7, albumin 3.5, TSH 1.54, Hgb a1c 4.4   . Fall at nursing home 10/25/2014  . GERD (gastroesophageal reflux disease)   . History of cardiovascular disorder 09/20/2006   X 3 in 2000 now on aspirin.    Marland Kitchen Hyperlipidemia 09/20/2006   05/15/14 LDL 95   . Hypertension   . Lumbosacral spondylosis 10/28/2009  . OSTEOPOROSIS 09/20/2006   Noted 2008    . Overactive bladder 11/29/2013   Myrbetriq prescribed by urology.    .  Protein calorie malnutrition ([place])    06/06/15 TP 6.0 albumin 2.9  . SCOLIOSIS 10/28/2009  . Scoliosis 10/27/2014  . Thrombocytosis ([place]) 05/31/2015   06/10/15 plt 602 07/23/15 plt 335 11/15/15 Hgb 12.8, Na 139, K 4.2, Bun 14, creat 0.68, TP 5.7, albumin 3.5, TSH 1.54, Hgb a1c 4.4  . TIA (transient ischemic attack) 09/20/2006   Past Surgical History:  Procedure Laterality Date  . ABDOMINAL HYSTERECTOMY    . APPENDECTOMY    . CHOLECYSTECTOMY    . TONSILLECTOMY      Allergies  Allergen Reactions  . Tramadol Other (See Comments)    Hallucinations, agitation/combativeness  . Amoxicillin     Per MAR  . Oxycodone Other (See Comments)    Per MAR   . Sulfamethoxazole     Per MAR   . Penicillins Rash    Outpatient Encounter Prescriptions as of 10/30/2016  Medication Sig  . acetaminophen (TYLENOL) 325 MG tablet Take 650 mg by mouth every 4 (four) hours as needed for moderate pain or headache.  Marland Kitchen alum & mag hydroxide-simeth (MAALOX/MYLANTA) 200-200-20 MG/5ML suspension Take 30 mLs by mouth every 6 (six) hours as needed for indigestion or heartburn.  Marland Kitchen amLODipine (NORVASC) 5 MG tablet Take 5 mg by mouth daily with breakfast.   . ipratropium-albuterol (DUONEB) 0.5-2.5 (3) MG/3ML SOLN Take 3 mLs by nebulization 3 (three) times daily.  Marland Kitchen loperamide (IMODIUM) 2 MG capsule Take 2 mg by mouth daily as needed for diarrhea or loose stools. Take one to two tablets by mouth as needed for diarrhea, Max 16 mg/24 hours call MD if Diarrhea continues  . losartan (COZAAR) 100 MG tablet Take 100 mg by mouth daily with breakfast.   . Mirabegron ER (MYRBETRIQ) 25 MG TB24 Take 25 mg by mouth daily with breakfast. Prescribed by urology  . mirtazapine (REMERON) 7.5 MG tablet Take 7.5 mg by mouth at bedtime.  . naproxen sodium (ANAPROX) 220 MG tablet Take 220 mg by mouth 2 (two) times daily as needed. Pain  . nitroGLYCERIN (NITROSTAT) 0.4 MG SL tablet Place 0.4 mg under the tongue every 5 (five) minutes as needed for  chest pain. After taking three times and no relief call MD.  . omeprazole (PRILOSEC) 20 MG capsule Take 20 mg by mouth daily.  . polyethylene glycol (MIRALAX / GLYCOLAX) packet Take 17 g by mouth daily as needed for moderate constipation.   Marland Kitchen pyridOXINE (VITAMIN B-6) 100 MG tablet Take 100 mg by mouth daily with breakfast.   . albuterol (PROVENTIL HFA;VENTOLIN HFA) 108 (90 BASE) MCG/ACT inhaler Inhale 2 puffs into the lungs every 6 (six) hours as needed for wheezing or shortness of breath.   Facility-Administered Encounter Medications as of 10/30/2016  Medication  . 0.9 %  sodium chloride infusion    Review of Systems  Constitutional: Negative for chills, diaphoresis and fever.  HENT: Positive for hearing loss. Negative for congestion, ear discharge, ear pain, nosebleeds, sore throat and tinnitus.   Eyes: Positive for discharge  and redness. Negative for photophobia and pain.       The right eye injected, c/o scratchy sensation.   Respiratory: Positive for cough. Negative for shortness of breath and wheezing.        DOE, cough, scant yellow phlegm sometimes  Cardiovascular: Positive for leg swelling. Negative for chest pain and palpitations.       Trace edema BLE R>L  Gastrointestinal: Negative for abdominal pain, constipation, diarrhea (Under control with Questran and Imodium), nausea and vomiting.  Genitourinary: Positive for frequency. Negative for dysuria, flank pain, hematuria and urgency.       1-2x/night. Stress incontinence  Musculoskeletal: Positive for arthralgias and gait problem. Negative for back pain.       Resolution of Left lower back, left hip, left thigh pain with weight bearing and position changes Chest pain was relieved after Mylanta 05/28/15 Neck pain 05/29/15 10/19/16 open nondisplaced fracture of proximal phalanx of right ring finger 10/30/16 sutures removed, wound is healed.   Allergic/Immunologic: Negative for environmental allergies.  Neurological: Negative for  dizziness, tremors and headaches.       Memory loss.  Hematological: Does not bruise/bleed easily.       Resolved anemia secondary to acute blood loss from lower GI bleeding. Plt count has returned to normal.  Psychiatric/Behavioral: Negative for dysphoric mood.    Immunization History  Administered Date(s) Administered  . HiB (PRP-OMP) 02/12/2004  . Influenza Split 01/11/2012  . Influenza Whole 01/07/2009  . Influenza-Unspecified 01/11/2014, 01/01/2015, 01/29/2016  . PPD Test 11/14/2014, 06/03/2015, 06/17/2015  . Pneumococcal Polysaccharide-23 04/28/2010  . Tdap 08/24/2011  . Zoster 04/13/2010   Pertinent  Health Maintenance Due  Topic Date Due  . DEXA SCAN  01/28/1991  . PNA vac Low Risk Adult (2 of 2 - PCV13) 04/29/2011  . INFLUENZA VACCINE  11/11/2016   Fall Risk  06/20/2015 05/23/2015 11/09/2014 05/10/2014  Falls in the past year? No No No No   Functional Status Survey:    Vitals:   10/30/16 1442  BP: (!) 150/76  Pulse: 80  Resp: 20  Temp: 98 F (36.7 C)  Weight: 153 lb 6.4 oz (69.6 kg)  Height: 5\' 5"  (1.651 m)   Body mass index is 25.53 kg/m. Physical Exam  Constitutional: She is oriented to person, place, and time. She appears well-developed and well-nourished. No distress.  HENT:  Head: Normocephalic and atraumatic.  Right Ear: No lacerations. No drainage. No foreign bodies. No mastoid tenderness. Tympanic membrane is not injected, not scarred, not perforated, not erythematous, not retracted and not bulging. Tympanic membrane mobility is abnormal. No middle ear effusion. Decreased hearing is noted.  Left Ear: No lacerations. No drainage. No foreign bodies. No mastoid tenderness. Tympanic membrane is not injected, not scarred, not perforated, not erythematous, not retracted and not bulging. Tympanic membrane mobility is abnormal.  No middle ear effusion. Decreased hearing is noted.  Nose: Nose normal.  Mouth/Throat: Oropharynx is clear and moist. No oropharyngeal  exudate.  Eyes: Pupils are equal, round, and reactive to light. EOM are normal. Right eye exhibits discharge. Left eye exhibits no discharge. No scleral icterus.  Injected right eye, small amount of yellow drainage noted.   Neck: Normal range of motion. Neck supple.  Cardiovascular: Normal rate, regular rhythm and normal heart sounds.   No murmur heard. Pulmonary/Chest: Effort normal. No respiratory distress. She has no wheezes. She has no rales. She exhibits no tenderness.  Noted DOE  Abdominal: Soft. Bowel sounds are normal. She exhibits no distension.  There is no tenderness.  Musculoskeletal: She exhibits edema and tenderness.  Resolved Left lower back, left hip, left thigh pain with weight bearing and position changes Chest pain was relieved after Mylanta 05/28/15 Neck pain 05/29/15 BLE trace edema 10/19/16 open nondisplaced fracture of proximal phalanx of right ring finger, suture closure x3, splint, sutures x3 removed, wound healed.    Neurological: She is alert and oriented to person, place, and time. She has normal reflexes.  Skin: Skin is warm and dry. She is not diaphoretic.  Lots of moles.   Psychiatric: Her behavior is normal. Thought content normal.    Labs reviewed:  Recent Labs  05/14/16 09/01/16 10/08/16  NA 139 137 141  K 4.5 4.0 4.3  BUN 14 14 13   CREATININE 0.7 0.7 1.0    Recent Labs  05/14/16 09/01/16 10/08/16  AST 19 17 23   ALT 17 16 22   ALKPHOS 64 66 57    Recent Labs  08/04/16 09/01/16 10/08/16  WBC 8.0 5.3 7.0  HGB 13.6 12.8 13.9  HCT 39 37 39  PLT 416* 378 375   Lab Results  Component Value Date   TSH 1.42 05/14/2016   Lab Results  Component Value Date   HGBA1C 4.4 11/14/2015   Lab Results  Component Value Date   CHOL 176 05/15/2014   HDL 62 05/15/2014   LDLCALC 95 05/15/2014   LDLDIRECT 114.3 12/08/2007   TRIG 93 05/15/2014   CHOLHDL 2.9 CALC 12/08/2007    Significant Diagnostic Results in last 30 days:  Dg Finger Ring  Right  Result Date: 10/16/2016 CLINICAL DATA:  Laceration after finger caught in wheelchair EXAM: RIGHT FOURTH FINGER 2+V COMPARISON:  None. FINDINGS: Frontal, oblique, and lateral views were obtained. There are fractures of the mid the distal aspects of the fourth proximal phalanx with alignment near anatomic at fracture sites. There is soft tissue swelling in this area. No other fractures. No dislocations. There is slight narrowing of the fourth PIP and DIP joints. IMPRESSION: Fractures of the mid the distal aspects of the fourth proximal phalanx with soft tissue swelling. Alignment near anatomic. No other fractures. No dislocation. Mild narrowing of the PIP and DIP joints of the fourth digit. Electronically Signed   By: Lowella Grip III M.D.   On: 10/16/2016 19:44    Assessment/Plan Conjunctivitis of right eye persisted redness in the right eye, c/o scratchy sensation, no change of vision, noted small amount of light yellow drainage, didn't respond to Naphcon A ophthalmic sol Trial of Cipro ophthalmic sol 1 gtt right eye q2h x 2 days, then q4h x 5 days while awake.   Acute bronchitis with COPD ([place]) Fully treated, resolved wheezed, c/o occasional hacking cough, scant yellow phlegm, DOE, most likely after infection cough, will try Medrol dose pk. Observe the patient.   GERD (gastroesophageal reflux disease) Stable, continue prn Mylanta, Omeprazole 20mg , ? Contributory to cough, observe.      Family/ staff Communication: AL  Labs/tests ordered:  none

## 2016-11-02 ENCOUNTER — Other Ambulatory Visit: Payer: Self-pay

## 2016-11-03 DIAGNOSIS — M25552 Pain in left hip: Secondary | ICD-10-CM | POA: Diagnosis not present

## 2016-11-03 DIAGNOSIS — H81399 Other peripheral vertigo, unspecified ear: Secondary | ICD-10-CM | POA: Diagnosis not present

## 2016-11-03 DIAGNOSIS — R2689 Other abnormalities of gait and mobility: Secondary | ICD-10-CM | POA: Diagnosis not present

## 2016-11-03 DIAGNOSIS — M6281 Muscle weakness (generalized): Secondary | ICD-10-CM | POA: Diagnosis not present

## 2016-11-03 DIAGNOSIS — R2681 Unsteadiness on feet: Secondary | ICD-10-CM | POA: Diagnosis not present

## 2016-11-03 DIAGNOSIS — K2971 Gastritis, unspecified, with bleeding: Secondary | ICD-10-CM | POA: Diagnosis not present

## 2016-11-04 DIAGNOSIS — R2689 Other abnormalities of gait and mobility: Secondary | ICD-10-CM | POA: Diagnosis not present

## 2016-11-04 DIAGNOSIS — R2681 Unsteadiness on feet: Secondary | ICD-10-CM | POA: Diagnosis not present

## 2016-11-04 DIAGNOSIS — M25552 Pain in left hip: Secondary | ICD-10-CM | POA: Diagnosis not present

## 2016-11-04 DIAGNOSIS — M6281 Muscle weakness (generalized): Secondary | ICD-10-CM | POA: Diagnosis not present

## 2016-11-04 DIAGNOSIS — H81399 Other peripheral vertigo, unspecified ear: Secondary | ICD-10-CM | POA: Diagnosis not present

## 2016-11-04 DIAGNOSIS — K2971 Gastritis, unspecified, with bleeding: Secondary | ICD-10-CM | POA: Diagnosis not present

## 2016-11-09 DIAGNOSIS — K2971 Gastritis, unspecified, with bleeding: Secondary | ICD-10-CM | POA: Diagnosis not present

## 2016-11-09 DIAGNOSIS — R2689 Other abnormalities of gait and mobility: Secondary | ICD-10-CM | POA: Diagnosis not present

## 2016-11-09 DIAGNOSIS — H81399 Other peripheral vertigo, unspecified ear: Secondary | ICD-10-CM | POA: Diagnosis not present

## 2016-11-09 DIAGNOSIS — R2681 Unsteadiness on feet: Secondary | ICD-10-CM | POA: Diagnosis not present

## 2016-11-09 DIAGNOSIS — M6281 Muscle weakness (generalized): Secondary | ICD-10-CM | POA: Diagnosis not present

## 2016-11-09 DIAGNOSIS — M25552 Pain in left hip: Secondary | ICD-10-CM | POA: Diagnosis not present

## 2016-11-10 ENCOUNTER — Non-Acute Institutional Stay: Payer: Medicare Other | Admitting: Nurse Practitioner

## 2016-11-10 ENCOUNTER — Encounter: Payer: Self-pay | Admitting: Nurse Practitioner

## 2016-11-10 DIAGNOSIS — I10 Essential (primary) hypertension: Secondary | ICD-10-CM

## 2016-11-10 DIAGNOSIS — H109 Unspecified conjunctivitis: Secondary | ICD-10-CM

## 2016-11-10 DIAGNOSIS — H81399 Other peripheral vertigo, unspecified ear: Secondary | ICD-10-CM | POA: Diagnosis not present

## 2016-11-10 DIAGNOSIS — K2971 Gastritis, unspecified, with bleeding: Secondary | ICD-10-CM | POA: Diagnosis not present

## 2016-11-10 DIAGNOSIS — R2689 Other abnormalities of gait and mobility: Secondary | ICD-10-CM | POA: Diagnosis not present

## 2016-11-10 DIAGNOSIS — M25552 Pain in left hip: Secondary | ICD-10-CM | POA: Diagnosis not present

## 2016-11-10 DIAGNOSIS — M6281 Muscle weakness (generalized): Secondary | ICD-10-CM | POA: Diagnosis not present

## 2016-11-10 DIAGNOSIS — R2681 Unsteadiness on feet: Secondary | ICD-10-CM | POA: Diagnosis not present

## 2016-11-10 NOTE — Assessment & Plan Note (Signed)
Noted redness in right eye, c/o discomfort, she feels her vision is declined, but she is able to count my fingers at 3 feet, denied pain, no drainage. Failed Naphcon and Cipro eye drops. Will refer to ophthalmology for further evaluation.

## 2016-11-10 NOTE — Progress Notes (Signed)
Location:  [facility] Number: [place]:  SNF (31) Provider:  Chavon Lucarelli, [place]  NP  Blanchie Serve, MD  Patient Care Team: Blanchie Serve, MD as PCP - General (Internal Medicine) Khaliah Barnick X, NP as Nurse Practitioner (Nurse Practitioner)  Extended Emergency Contact Information Primary Emergency Contact: [name] Address: [place], [place] [address] [name] of [place] Phone: [phone] Work Phone: [phone] Mobile Phone: [phone] Relation: Daughter Secondary Emergency Contact: [name] Address: [address] [place] [address] [name] of [place] Phone: [phone] Mobile Phone: [phone] Relation: Son  Code Status:  DNR Goals of care: Advanced Directive information Advanced Directives 11/10/2016  Does Patient Have a Medical Advance Directive? Yes  Type of Paramedic of [place];Living will;Out of facility DNR (pink MOST or yellow form)  Does patient want to make changes to medical advance directive? No - Patient declined  Copy of [place] in Chart? Yes  Pre-existing out of facility DNR order (yellow form or pink MOST form) Yellow form placed in chart (order not valid for inpatient use)     Chief Complaint  Patient presents with  . Acute Visit    F/U-> eye redness (RT)    HPI:  Pt is a 83 y.o. female seen today for an acute visit for Redness in right eye, c/o discomfort, she feels her vision is declined, but she is able to count my fingers at 3 feet, denied pain, no drainage. Failed Naphcon and Cipro eye drops.     Her blood pressure is controlled, taking Amlodipine 5mg , Losartan 100mg    Past Medical History:  Diagnosis Date  . Abnormality of gait 04/28/2010   History of fall onto right shoulder-has participated in PT. Uses walker.     . Acute encephalopathy 10/25/14   Occurred when septic  .  Acute lower GI bleeding 10/30/2014  . Adjustment disorder with mixed anxiety and depressed mood 06/04/2015   11/15/15 Hgb 12.8, Na 139, K 4.2, Bun 14, creat 0.68, TP 5.7, albumin 3.5, TSH 1.54, Hgb a1c 4.4   . Anemia   . Aortic atherosclerosis ([place]) 10/27/2014  . Asthma   . Cerebral atrophy 10/27/2014  . Cerebrovascular disease 10/27/2014   Small vessel disease  . Cervical disc disease   . Chronic lower back pain 06/04/2015   lumbar spondylosis. Regular f/u Dr. French Ana. Receives injections last 01/2013.  05/27/15 Xray left hip/pelvis: spondylotic changes, severe osteoarthritis Continue Tylenol and Tramadol prn for pain, observe the patient, adding Cymbalta may help. Mid to lower back pain more on the right side back, may consider X-ray of thoracic spine. 06/01/15 CT thoracic spine showed no acute changes.  Patient received 10 injection in the back through Dr. Alvester Morin office. She says the previous problems of discomfort in the back and down the leg have completely resolved. Takes Aleve 220mg  bid   . Debility 06/20/2015   11/15/15 Hgb 12.8, Na 139, K 4.2, Bun 14, creat 0.68, TP 5.7, albumin 3.5, TSH 1.54, Hgb a1c 4.4   . Degenerative disc disease, lumbar 10/27/2014  . Depression   . Dermatitis 03/25/2016  . Diarrhea 10/25/14  . Diverticulosis 10/27/2014  . Essential hypertension 09/20/2006   11/15/15 Hgb 12.8, Na 139, K 4.2, Bun 14, creat 0.68, TP 5.7, albumin 3.5, TSH 1.54, Hgb a1c 4.4   . Fall at  nursing home 10/25/2014  . GERD (gastroesophageal reflux disease)   . History of cardiovascular disorder 09/20/2006   X 3 in 2000 now on aspirin.    Marland Kitchen Hyperlipidemia 09/20/2006   05/15/14 LDL 95   . Hypertension   . Lumbosacral spondylosis 10/28/2009  . OSTEOPOROSIS 09/20/2006   Noted 2008    . Overactive bladder 11/29/2013   Myrbetriq prescribed by urology.    . Protein calorie malnutrition ([place])    06/06/15 TP 6.0 albumin 2.9  . SCOLIOSIS 10/28/2009  . Scoliosis 10/27/2014  . Thrombocytosis ([place]) 05/31/2015    06/10/15 plt 602 07/23/15 plt 335 11/15/15 Hgb 12.8, Na 139, K 4.2, Bun 14, creat 0.68, TP 5.7, albumin 3.5, TSH 1.54, Hgb a1c 4.4  . TIA (transient ischemic attack) 09/20/2006   Past Surgical History:  Procedure Laterality Date  . ABDOMINAL HYSTERECTOMY    . APPENDECTOMY    . CHOLECYSTECTOMY    . TONSILLECTOMY      Allergies  Allergen Reactions  . Tramadol Other (See Comments)    Hallucinations, agitation/combativeness  . Amoxicillin     Per MAR  . Oxycodone Other (See Comments)    Per MAR   . Sulfamethoxazole     Per MAR   . Penicillins Rash    Outpatient Encounter Prescriptions as of 11/10/2016  Medication Sig  . acetaminophen (TYLENOL) 325 MG tablet Take 650 mg by mouth every 4 (four) hours as needed for moderate pain or headache.  . albuterol (PROVENTIL HFA;VENTOLIN HFA) 108 (90 BASE) MCG/ACT inhaler Inhale 2 puffs into the lungs every 6 (six) hours as needed for wheezing or shortness of breath.  Marland Kitchen alum & mag hydroxide-simeth (MAALOX/MYLANTA) 200-200-20 MG/5ML suspension Take 30 mLs by mouth every 6 (six) hours as needed for indigestion or heartburn.  Marland Kitchen amLODipine (NORVASC) 5 MG tablet Take 5 mg by mouth daily with breakfast.   . loperamide (IMODIUM) 2 MG capsule Take 2 mg by mouth daily as needed for diarrhea or loose stools. Take one to two tablets by mouth as needed for diarrhea, Max 16 mg/24 hours call MD if Diarrhea continues  . losartan (COZAAR) 100 MG tablet Take 100 mg by mouth daily with breakfast.   . Mirabegron ER (MYRBETRIQ) 25 MG TB24 Take 25 mg by mouth daily with breakfast. Prescribed by urology  . mirtazapine (REMERON) 7.5 MG tablet Take 7.5 mg by mouth at bedtime.  . naproxen sodium (ANAPROX) 220 MG tablet Take 220 mg by mouth 2 (two) times daily as needed. Pain  . nitroGLYCERIN (NITROSTAT) 0.4 MG SL tablet Place 0.4 mg under the tongue every 5 (five) minutes as needed for chest pain. After taking three times and no relief call MD.  . omeprazole (PRILOSEC) 20 MG  capsule Take 20 mg by mouth daily.  . polyethylene glycol (MIRALAX / GLYCOLAX) packet Take 17 g by mouth daily as needed for moderate constipation.   Marland Kitchen pyridOXINE (VITAMIN B-6) 100 MG tablet Take 100 mg by mouth daily with breakfast.   . [DISCONTINUED] ipratropium-albuterol (DUONEB) 0.5-2.5 (3) MG/3ML SOLN Take 3 mLs by nebulization 3 (three) times daily.   Facility-Administered Encounter Medications as of 11/10/2016  Medication  . 0.9 %  sodium chloride infusion    Review of Systems  Constitutional: Negative for activity change and appetite change.  HENT: Positive for hearing loss. Negative for congestion and tinnitus.   Eyes: Positive for pain and redness. Negative for photophobia and discharge.       The right eye injected, c/o not  comfortable.   Respiratory: Positive for cough. Negative for shortness of breath.        DOE, cough, scant yellow phlegm sometimes  Cardiovascular: Positive for leg swelling. Negative for chest pain and palpitations.       Trace edema BLE R>L  Gastrointestinal: Negative for abdominal pain. Diarrhea: Under control with Questran and Imodium.  Genitourinary: Positive for frequency. Negative for dysuria, flank pain, hematuria and urgency.       1-2x/night. Stress incontinence  Musculoskeletal: Positive for arthralgias and gait problem. Negative for back pain.       Resolution of Left lower back, left hip, left thigh pain with weight bearing and position changes Chest pain was relieved after Mylanta 05/28/15 Neck pain 05/29/15 10/19/16 open nondisplaced fracture of proximal phalanx of right ring finger   Skin: Negative for rash and wound.  Neurological: Negative for dizziness, tremors and headaches.       Memory loss.  Hematological: Does not bruise/bleed easily.       Resolved anemia secondary to acute blood loss from lower GI bleeding. Plt count has returned to normal.  Psychiatric/Behavioral: Negative for agitation and behavioral problems.    Immunization  History  Administered Date(s) Administered  . HiB (PRP-OMP) 02/12/2004  . Influenza Split 01/11/2012  . Influenza Whole 01/07/2009  . Influenza-Unspecified 01/11/2014, 01/01/2015, 01/29/2016  . PPD Test 11/14/2014, 06/03/2015, 06/17/2015  . Pneumococcal Polysaccharide-23 04/28/2010  . Tdap 08/24/2011  . Zoster 04/13/2010   Pertinent  Health Maintenance Due  Topic Date Due  . DEXA SCAN  01/28/1991  . PNA vac Low Risk Adult (2 of 2 - PCV13) 04/29/2011  . INFLUENZA VACCINE  11/11/2016   Fall Risk  06/20/2015 05/23/2015 11/09/2014 05/10/2014  Falls in the past year? No No No No   Functional Status Survey:    Vitals:   11/10/16 1335  BP: 128/60  Pulse: 80  Resp: 18  Temp: 97.9 F (36.6 C)  Weight: 153 lb 6.4 oz (69.6 kg)  Height: 5\' 5"  (1.651 m)   Body mass index is 25.53 kg/m. Physical Exam  Constitutional: She is oriented to person, place, and time. She appears well-developed and well-nourished. No distress.  HENT:  Head: Normocephalic and atraumatic.  Right Ear: No lacerations. No drainage. No foreign bodies. No mastoid tenderness. Tympanic membrane is not injected, not scarred, not perforated, not erythematous, not retracted and not bulging. Tympanic membrane mobility is abnormal. No middle ear effusion. Decreased hearing is noted.  Left Ear: No lacerations. No drainage. No foreign bodies. No mastoid tenderness. Tympanic membrane is not injected, not scarred, not perforated, not erythematous, not retracted and not bulging. Tympanic membrane mobility is abnormal.  No middle ear effusion. Decreased hearing is noted.  Nose: Nose normal.  Mouth/Throat: Oropharynx is clear and moist. No oropharyngeal exudate.  Eyes: Pupils are equal, round, and reactive to light. EOM are normal. Right eye exhibits no discharge. Left eye exhibits no discharge. No scleral icterus.  Redness in right eye, c/o discomfort, she feels her vision is declined, but she is able to count my fingers at 3 feet,  denied pain, no drainage.  Neck: Normal range of motion. Neck supple.  Cardiovascular: Normal rate, regular rhythm and normal heart sounds.   No murmur heard. Pulmonary/Chest: Effort normal. No respiratory distress. She has no wheezes. She has no rales. She exhibits no tenderness.  Noted DOE  Abdominal: Soft. Bowel sounds are normal. She exhibits no distension. There is no tenderness.  Musculoskeletal: She exhibits edema. She exhibits  no tenderness.  BLE trace edema    Neurological: She is alert and oriented to person, place, and time. She has normal reflexes.  Skin: Skin is warm and dry. She is not diaphoretic.  Lots of moles.   Psychiatric: Her behavior is normal. Thought content normal.    Labs reviewed:  Recent Labs  05/14/16 09/01/16 10/08/16  NA 139 137 141  K 4.5 4.0 4.3  BUN 14 14 13   CREATININE 0.7 0.7 1.0    Recent Labs  05/14/16 09/01/16 10/08/16  AST 19 17 23   ALT 17 16 22   ALKPHOS 64 66 57    Recent Labs  08/04/16 09/01/16 10/08/16  WBC 8.0 5.3 7.0  HGB 13.6 12.8 13.9  HCT 39 37 39  PLT 416* 378 375   Lab Results  Component Value Date   TSH 1.42 05/14/2016   Lab Results  Component Value Date   HGBA1C 4.4 11/14/2015   Lab Results  Component Value Date   CHOL 176 05/15/2014   HDL 62 05/15/2014   LDLCALC 95 05/15/2014   LDLDIRECT 114.3 12/08/2007   TRIG 93 05/15/2014   CHOLHDL 2.9 CALC 12/08/2007    Significant Diagnostic Results in last 30 days:  Dg Finger Ring Right  Result Date: 10/16/2016 CLINICAL DATA:  Laceration after finger caught in wheelchair EXAM: RIGHT FOURTH FINGER 2+V COMPARISON:  None. FINDINGS: Frontal, oblique, and lateral views were obtained. There are fractures of the mid the distal aspects of the fourth proximal phalanx with alignment near anatomic at fracture sites. There is soft tissue swelling in this area. No other fractures. No dislocations. There is slight narrowing of the fourth PIP and DIP joints. IMPRESSION: Fractures  of the mid the distal aspects of the fourth proximal phalanx with soft tissue swelling. Alignment near anatomic. No other fractures. No dislocation. Mild narrowing of the PIP and DIP joints of the fourth digit. Electronically Signed   By: Lowella Grip III M.D.   On: 10/16/2016 19:44    Assessment/Plan Conjunctivitis of right eye Noted redness in right eye, c/o discomfort, she feels her vision is declined, but she is able to count my fingers at 3 feet, denied pain, no drainage. Failed Naphcon and Cipro eye drops. Will refer to ophthalmology for further evaluation.      Essential hypertension Controlled, continue Amlodipine 5mg , Losartan 100mg      Family/ staff Communication: AL, ophthalmology referral.  Labs/tests ordered:  none

## 2016-11-10 NOTE — Assessment & Plan Note (Signed)
Controlled, continue Amlodipine 5mg , Losartan 100mg 

## 2016-11-20 DIAGNOSIS — H15121 Nodular episcleritis, right eye: Secondary | ICD-10-CM | POA: Diagnosis not present

## 2016-11-23 DIAGNOSIS — S61204A Unspecified open wound of right ring finger without damage to nail, initial encounter: Secondary | ICD-10-CM | POA: Diagnosis not present

## 2016-11-23 DIAGNOSIS — S62644A Nondisplaced fracture of proximal phalanx of right ring finger, initial encounter for closed fracture: Secondary | ICD-10-CM | POA: Diagnosis not present

## 2016-12-04 ENCOUNTER — Non-Acute Institutional Stay: Payer: Medicare Other | Admitting: Internal Medicine

## 2016-12-04 ENCOUNTER — Encounter: Payer: Self-pay | Admitting: Internal Medicine

## 2016-12-04 DIAGNOSIS — I10 Essential (primary) hypertension: Secondary | ICD-10-CM

## 2016-12-04 DIAGNOSIS — N3281 Overactive bladder: Secondary | ICD-10-CM

## 2016-12-04 DIAGNOSIS — F329 Major depressive disorder, single episode, unspecified: Secondary | ICD-10-CM | POA: Diagnosis not present

## 2016-12-04 DIAGNOSIS — M545 Low back pain, unspecified: Secondary | ICD-10-CM

## 2016-12-04 DIAGNOSIS — K219 Gastro-esophageal reflux disease without esophagitis: Secondary | ICD-10-CM | POA: Diagnosis not present

## 2016-12-04 DIAGNOSIS — I739 Peripheral vascular disease, unspecified: Secondary | ICD-10-CM

## 2016-12-04 DIAGNOSIS — F32A Depression, unspecified: Secondary | ICD-10-CM

## 2016-12-04 DIAGNOSIS — G8929 Other chronic pain: Secondary | ICD-10-CM | POA: Diagnosis not present

## 2016-12-04 NOTE — Progress Notes (Signed)
Location:  [facility] Room Number: [place] of Service:  ALF [phone]) Provider:  Blanchie Serve MD  Blanchie Serve, MD  Patient Care Team: Blanchie Serve, MD as PCP - General (Internal Medicine) [name], [name], NP as Nurse Practitioner (Nurse Practitioner)  Extended Emergency Contact Information Primary Emergency Contact: [name] Address: [place], [place] [address] [name] of [place] Phone: [phone] Work Phone: [phone] Mobile Phone: [phone] Relation: Daughter Secondary Emergency Contact: [name] Address: [address] [place] [address] [name] of [place] Phone: [phone] Mobile Phone: [phone] Relation: Son  Code Status:  DNR Goals of care: Advanced Directive information Advanced Directives 11/10/2016  Does Patient Have a Medical Advance Directive? Yes  Type of Paramedic of [place];Living will;Out of facility DNR (pink MOST or yellow form)  Does patient want to make changes to medical advance directive? No - Patient declined  Copy of [place] in Chart? Yes  Pre-existing out of facility DNR order (yellow form or pink MOST form) Yellow form placed in chart (order not valid for inpatient use)     Chief Complaint  Patient presents with  . Medical Management of Chronic Issues    Routine Visit     HPI:  Pt is a 83 y.o. female seen today for medical management of chronic diseases. She is seen in her room. She is hard of hearing and has hearing aids. She complaints of occasional back pain. Denies any there concerns.    Past Medical History:  Diagnosis Date  . Abnormality of gait 04/28/2010   History of fall onto right shoulder-has participated in PT. Uses walker.     . Acute encephalopathy 10/25/14   Occurred when septic  . Acute lower GI bleeding 10/30/2014  . Adjustment disorder with mixed anxiety and  depressed mood 06/04/2015   11/15/15 Hgb 12.8, Na 139, K 4.2, Bun 14, creat 0.68, TP 5.7, albumin 3.5, TSH 1.54, Hgb a1c 4.4   . Anemia   . Aortic atherosclerosis ([place]) 10/27/2014  . Asthma   . Cerebral atrophy 10/27/2014  . Cerebrovascular disease 10/27/2014   Small vessel disease  . Cervical disc disease   . Chronic lower back pain 06/04/2015   lumbar spondylosis. Regular f/u Dr. French Ana. Receives injections last 01/2013.  05/27/15 Xray left hip/pelvis: spondylotic changes, severe osteoarthritis Continue Tylenol and Tramadol prn for pain, observe the patient, adding Cymbalta may help. Mid to lower back pain more on the right side back, may consider X-ray of thoracic spine. 06/01/15 CT thoracic spine showed no acute changes.  Patient received 10 injection in the back through Dr. Alvester Morin office. She says the previous problems of discomfort in the back and down the leg have completely resolved. Takes Aleve 220mg  bid   . Debility 06/20/2015   11/15/15 Hgb 12.8, Na 139, K 4.2, Bun 14, creat 0.68, TP 5.7, albumin 3.5, TSH 1.54, Hgb a1c 4.4   . Degenerative disc disease, lumbar 10/27/2014  . Depression   . Dermatitis 03/25/2016  . Diarrhea 10/25/14  . Diverticulosis 10/27/2014  . Essential hypertension 09/20/2006   11/15/15 Hgb 12.8, Na 139, K 4.2, Bun 14, creat 0.68, TP 5.7, albumin 3.5, TSH 1.54, Hgb a1c 4.4   . Fall at nursing home 10/25/2014  . GERD (gastroesophageal reflux disease)   . History of cardiovascular disorder 09/20/2006  X 3 in 2000 now on aspirin.    Marland Kitchen Hyperlipidemia 09/20/2006   05/15/14 LDL 95   . Hypertension   . Lumbosacral spondylosis 10/28/2009  . OSTEOPOROSIS 09/20/2006   Noted 2008    . Overactive bladder 11/29/2013   Myrbetriq prescribed by urology.    . Protein calorie malnutrition ([place])    06/06/15 TP 6.0 albumin 2.9  . SCOLIOSIS 10/28/2009  . Scoliosis 10/27/2014  . Thrombocytosis ([place]) 05/31/2015   06/10/15 plt 602 07/23/15 plt 335 11/15/15 Hgb 12.8, Na 139, K 4.2, Bun 14, creat 0.68,  TP 5.7, albumin 3.5, TSH 1.54, Hgb a1c 4.4  . TIA (transient ischemic attack) 09/20/2006   Past Surgical History:  Procedure Laterality Date  . ABDOMINAL HYSTERECTOMY    . APPENDECTOMY    . CHOLECYSTECTOMY    . TONSILLECTOMY      Allergies  Allergen Reactions  . Tramadol Other (See Comments)    Hallucinations, agitation/combativeness  . Amoxicillin     Per MAR  . Oxycodone Other (See Comments)    Per MAR   . Sulfamethoxazole     Per MAR   . Penicillins Rash    Outpatient Encounter Prescriptions as of 12/04/2016  Medication Sig  . acetaminophen (TYLENOL) 325 MG tablet Take 650 mg by mouth every 4 (four) hours as needed for moderate pain or headache.  . albuterol (PROVENTIL HFA;VENTOLIN HFA) 108 (90 BASE) MCG/ACT inhaler Inhale 2 puffs into the lungs every 6 (six) hours as needed for wheezing or shortness of breath.  Marland Kitchen alum & mag hydroxide-simeth (MAALOX/MYLANTA) 200-200-20 MG/5ML suspension Take 30 mLs by mouth every 4 (four) hours as needed for indigestion or heartburn.   Marland Kitchen amLODipine (NORVASC) 5 MG tablet Take 5 mg by mouth daily with breakfast.   . cetaphil (CETAPHIL) cream Apply 1 application topically daily.  . clobetasol cream (TEMOVATE) 7.90 % Apply 1 application topically 2 (two) times daily as needed.  . loperamide (IMODIUM) 2 MG capsule Take 4 mg by mouth as needed for diarrhea or loose stools. Take one to two tablets by mouth as needed for diarrhea, Max 16 mg/24 hours call MD if Diarrhea continues  . losartan (COZAAR) 100 MG tablet Take 100 mg by mouth daily with breakfast.   . Mirabegron ER (MYRBETRIQ) 25 MG TB24 Take 25 mg by mouth daily with breakfast. Prescribed by urology  . mirtazapine (REMERON) 7.5 MG tablet Take 7.5 mg by mouth at bedtime.  . naproxen sodium (ANAPROX) 220 MG tablet Take 220 mg by mouth 2 (two) times daily as needed. Pain  . nitroGLYCERIN (NITROSTAT) 0.4 MG SL tablet Place 0.4 mg under the tongue every 5 (five) minutes as needed for chest pain.  After taking three times and no relief call MD.  . omeprazole (PRILOSEC) 20 MG capsule Take 20 mg by mouth daily.  . polyethylene glycol (MIRALAX / GLYCOLAX) packet Take 17 g by mouth daily as needed for moderate constipation.   Marland Kitchen pyridOXINE (VITAMIN B-6) 100 MG tablet Take 100 mg by mouth daily with breakfast.    Facility-Administered Encounter Medications as of 12/04/2016  Medication  . 0.9 %  sodium chloride infusion    Review of Systems  Constitutional: Negative for appetite change, chills and fever.  HENT: Positive for hearing loss. Negative for congestion, ear pain, rhinorrhea, sore throat and trouble swallowing.   Eyes: Negative for visual disturbance.  Respiratory: Negative for cough and shortness of breath.   Cardiovascular: Positive for leg swelling. Negative for chest pain and palpitations.  Gastrointestinal: Negative for abdominal pain, constipation, nausea and vomiting.       Had bowel movement this am  Genitourinary: Negative for dysuria, flank pain and hematuria.  Musculoskeletal: Positive for back pain and gait problem.  Neurological: Negative for seizures, light-headedness and headaches.  Psychiatric/Behavioral: Positive for confusion. Negative for agitation and behavioral problems.    Immunization History  Administered Date(s) Administered  . HiB (PRP-OMP) 02/12/2004  . Influenza Split 01/11/2012  . Influenza Whole 01/07/2009  . Influenza-Unspecified 01/11/2014, 01/01/2015, 01/29/2016  . PPD Test 11/14/2014, 06/03/2015, 06/17/2015  . Pneumococcal Polysaccharide-23 04/28/2010  . Tdap 08/24/2011  . Zoster 04/13/2010   Pertinent  Health Maintenance Due  Topic Date Due  . DEXA SCAN  01/28/1991  . PNA vac Low Risk Adult (2 of 2 - PCV13) 04/29/2011  . INFLUENZA VACCINE  11/11/2016   Fall Risk  06/20/2015 05/23/2015 11/09/2014 05/10/2014  Falls in the past year? No No No No   Functional Status Survey:    Vitals:   12/04/16 1511  BP: 132/80  Pulse: 74  Resp: 18    Temp: (!) 97.3 F (36.3 C)  TempSrc: Oral  Weight: 152 lb 12.8 oz (69.3 kg)  Height: 5\' 5"  (1.651 m)   Body mass index is 25.43 kg/m. Physical Exam  Constitutional: She appears well-developed and well-nourished. No distress.  HENT:  Head: Normocephalic and atraumatic.  Mouth/Throat: Oropharynx is clear and moist.  Has hearing aid  Eyes: Conjunctivae and EOM are normal.  Has corrective lenses  Neck: Normal range of motion. Neck supple.  Cardiovascular: Normal rate and regular rhythm.   Pulmonary/Chest: Effort normal and breath sounds normal. No respiratory distress. She has no wheezes. She has no rales.  Abdominal: Soft. Bowel sounds are normal. There is no tenderness. There is no guarding.  Musculoskeletal: She exhibits edema.  Can move all 4 extremities, unsteady gait, uses a walker, 1+ leg edema  Lymphadenopathy:    She has no cervical adenopathy.  Neurological: She is alert.  Alert and oriented to person and place but not to time  Skin: Skin is warm and dry. No rash noted. She is not diaphoretic. No erythema.  Psychiatric: She has a normal mood and affect.  Pleasantly confused    Labs reviewed:  Recent Labs  05/14/16 09/01/16 10/08/16  NA 139 137 141  K 4.5 4.0 4.3  BUN 14 14 13   CREATININE 0.7 0.7 1.0    Recent Labs  05/14/16 09/01/16 10/08/16  AST 19 17 23   ALT 17 16 22   ALKPHOS 64 66 57    Recent Labs  08/04/16 09/01/16 10/08/16  WBC 8.0 5.3 7.0  HGB 13.6 12.8 13.9  HCT 39 37 39  PLT 416* 378 375   Lab Results  Component Value Date   TSH 1.42 05/14/2016   Lab Results  Component Value Date   HGBA1C 4.4 11/14/2015   Lab Results  Component Value Date   CHOL 176 05/15/2014   HDL 62 05/15/2014   LDLCALC 95 05/15/2014   LDLDIRECT 114.3 12/08/2007   TRIG 93 05/15/2014   CHOLHDL 2.9 CALC 12/08/2007    Significant Diagnostic Results in last 30 days:  No results found.  Assessment/Plan  Depression Stable mood. Currently on mirtazapine 7.5  mg daily. Change this to every other day for 1 week and stop. Monitor her mood. GDR attempt  Low back pain Chronic and stable. Continue tylenol 650 mg q4h prn pain with prn naproxen. With her age, d/c naproxen. Has not required  it recently  Hypertension Stable bp reading on review, continue amlodipine 5 mg daily and losartan 100 mg daily  PVD Advised to keep legs elevated at rest, does not want compression stocking. Her amlodipine can be contributing to some of the leg edema as well  gerd Denies any symptom. On omeprazole 20 mg daily, change this to 10 mg daily and monitor   Family/ staff Communication: reviewed care plan with patient and charge nurse.    Labs/tests ordered:     Blanchie Serve, MD Internal Medicine [place], [place] [address] Cell Phone (Monday-Friday 8 am - 5 pm): [phone] On Call: [phone] and follow prompts after 5 pm and on weekends Office Phone: [phone] Office Fax: [phone]

## 2016-12-08 ENCOUNTER — Non-Acute Institutional Stay: Payer: Medicare Other | Admitting: Nurse Practitioner

## 2016-12-08 ENCOUNTER — Encounter: Payer: Self-pay | Admitting: Nurse Practitioner

## 2016-12-08 DIAGNOSIS — I10 Essential (primary) hypertension: Secondary | ICD-10-CM

## 2016-12-08 DIAGNOSIS — R4189 Other symptoms and signs involving cognitive functions and awareness: Secondary | ICD-10-CM | POA: Diagnosis not present

## 2016-12-08 NOTE — Progress Notes (Signed)
Location:  [facility] Room Number: 539 Place of Service:  ALF [phone]) Provider:  Fareeha Evon, [place]  NP  Blanchie Serve, MD  Patient Care Team: Blanchie Serve, MD as PCP - General (Internal Medicine) Kairo Laubacher X, NP as Nurse Practitioner (Nurse Practitioner)  Extended Emergency Contact Information Primary Emergency Contact: [name] Address: [place], [place] [address] [name] of [place] Phone: [phone] Work Phone: [phone] Mobile Phone: [phone] Relation: Daughter Secondary Emergency Contact: [name] Address: [address] [place] [address] [name] of [place] Phone: [phone] Mobile Phone: [phone] Relation: Son  Code Status:  DNR Goals of care: Advanced Directive information Advanced Directives 12/08/2016  Does Patient Have a Medical Advance Directive? Yes  Type of Paramedic of [place];Living will;Out of facility DNR (pink MOST or yellow form)  Does patient want to make changes to medical advance directive? No - Patient declined  Copy of [place] in Chart? Yes  Pre-existing out of facility DNR order (yellow form or pink MOST form) Yellow form placed in chart (order not valid for inpatient use)     Chief Complaint  Patient presents with  . Acute Visit    Memory loss,    HPI:  Pt is a 83 y.o. female seen today for an acute visit for memory/cognitve decline, the patient stated her memory has no problem, she is able to function at AL level. MMSE 12/07/16 21/30. She states she sleeps and eats well, she is in her usual state of health. She has Hx of HTN, taking Losartan 100mg  qd, Amlodipine 5mg ,    Past Medical History:  Diagnosis Date  . Abnormality of gait 04/28/2010   History of fall onto right shoulder-has participated in PT. Uses walker.     . Acute encephalopathy 10/25/14   Occurred when septic  . Acute  lower GI bleeding 10/30/2014  . Adjustment disorder with mixed anxiety and depressed mood 06/04/2015   11/15/15 Hgb 12.8, Na 139, K 4.2, Bun 14, creat 0.68, TP 5.7, albumin 3.5, TSH 1.54, Hgb a1c 4.4   . Anemia   . Aortic atherosclerosis ([place]) 10/27/2014  . Asthma   . Cerebral atrophy 10/27/2014  . Cerebrovascular disease 10/27/2014   Small vessel disease  . Cervical disc disease   . Chronic lower back pain 06/04/2015   lumbar spondylosis. Regular f/u Dr. French Ana. Receives injections last 01/2013.  05/27/15 Xray left hip/pelvis: spondylotic changes, severe osteoarthritis Continue Tylenol and Tramadol prn for pain, observe the patient, adding Cymbalta may help. Mid to lower back pain more on the right side back, may consider X-ray of thoracic spine. 06/01/15 CT thoracic spine showed no acute changes.  Patient received 10 injection in the back through Dr. Alvester Morin office. She says the previous problems of discomfort in the back and down the leg have completely resolved. Takes Aleve 220mg  bid   . Debility 06/20/2015   11/15/15 Hgb 12.8, Na 139, K 4.2, Bun 14, creat 0.68, TP 5.7, albumin 3.5, TSH 1.54, Hgb a1c 4.4   . Degenerative disc disease, lumbar 10/27/2014  . Depression   . Dermatitis 03/25/2016  . Diarrhea 10/25/14  . Diverticulosis 10/27/2014  . Essential hypertension 09/20/2006   11/15/15 Hgb 12.8, Na 139, K 4.2, Bun 14, creat 0.68, TP 5.7, albumin 3.5, TSH 1.54, Hgb a1c 4.4   . Fall at nursing  home 10/25/2014  . GERD (gastroesophageal reflux disease)   . History of cardiovascular disorder 09/20/2006   X 3 in 2000 now on aspirin.    Marland Kitchen Hyperlipidemia 09/20/2006   05/15/14 LDL 95   . Hypertension   . Lumbosacral spondylosis 10/28/2009  . OSTEOPOROSIS 09/20/2006   Noted 2008    . Overactive bladder 11/29/2013   Myrbetriq prescribed by urology.    . Protein calorie malnutrition ([place])    06/06/15 TP 6.0 albumin 2.9  . SCOLIOSIS 10/28/2009  . Scoliosis 10/27/2014  . Thrombocytosis ([place]) 05/31/2015   06/10/15  plt 602 07/23/15 plt 335 11/15/15 Hgb 12.8, Na 139, K 4.2, Bun 14, creat 0.68, TP 5.7, albumin 3.5, TSH 1.54, Hgb a1c 4.4  . TIA (transient ischemic attack) 09/20/2006   Past Surgical History:  Procedure Laterality Date  . ABDOMINAL HYSTERECTOMY    . APPENDECTOMY    . CHOLECYSTECTOMY    . TONSILLECTOMY      Allergies  Allergen Reactions  . Tramadol Other (See Comments)    Hallucinations, agitation/combativeness  . Amoxicillin     Per MAR  . Oxycodone Other (See Comments)    Per MAR   . Sulfamethoxazole     Per MAR   . Penicillins Rash    Outpatient Encounter Prescriptions as of 12/08/2016  Medication Sig  . acetaminophen (TYLENOL) 325 MG tablet Take 650 mg by mouth every 4 (four) hours as needed for moderate pain or headache.  . albuterol (PROVENTIL HFA;VENTOLIN HFA) 108 (90 BASE) MCG/ACT inhaler Inhale 2 puffs into the lungs every 6 (six) hours as needed for wheezing or shortness of breath.  Marland Kitchen alum & mag hydroxide-simeth (MAALOX/MYLANTA) 200-200-20 MG/5ML suspension Take 30 mLs by mouth every 4 (four) hours as needed for indigestion or heartburn.   Marland Kitchen amLODipine (NORVASC) 5 MG tablet Take 5 mg by mouth daily with breakfast.   . cetaphil (CETAPHIL) cream Apply 1 application topically daily.  . clobetasol cream (TEMOVATE) 0.08 % Apply 1 application topically 2 (two) times daily as needed.  . loperamide (IMODIUM) 2 MG capsule Take 4 mg by mouth as needed for diarrhea or loose stools. Take one to two tablets by mouth as needed for diarrhea, Max 16 mg/24 hours call MD if Diarrhea continues  . losartan (COZAAR) 100 MG tablet Take 100 mg by mouth daily with breakfast.   . Mirabegron ER (MYRBETRIQ) 25 MG TB24 Take 25 mg by mouth daily with breakfast. Prescribed by urology  . nitroGLYCERIN (NITROSTAT) 0.4 MG SL tablet Place 0.4 mg under the tongue every 5 (five) minutes as needed for chest pain. After taking three times and no relief call MD.  . polyethylene glycol (MIRALAX / GLYCOLAX) packet  Take 17 g by mouth daily as needed for moderate constipation.   Marland Kitchen pyridOXINE (VITAMIN B-6) 100 MG tablet Take 100 mg by mouth daily with breakfast.   . [DISCONTINUED] mirtazapine (REMERON) 7.5 MG tablet Take 7.5 mg by mouth at bedtime.  . [DISCONTINUED] naproxen sodium (ANAPROX) 220 MG tablet Take 220 mg by mouth 2 (two) times daily as needed. Pain  . [DISCONTINUED] omeprazole (PRILOSEC) 20 MG capsule Take 20 mg by mouth daily.   No facility-administered encounter medications on file as of 12/08/2016.     Review of Systems  Constitutional: Negative for activity change, appetite change, chills, diaphoresis, fatigue and fever.  Neurological: Negative for dizziness, tremors, seizures, syncope, numbness and headaches.  Psychiatric/Behavioral: Negative for agitation, behavioral problems, confusion, hallucinations and sleep disturbance. The patient is not  nervous/anxious.        She does have memory lapses.     Immunization History  Administered Date(s) Administered  . HiB (PRP-OMP) 02/12/2004  . Influenza Split 01/11/2012  . Influenza Whole 01/07/2009  . Influenza-Unspecified 01/11/2014, 01/01/2015, 01/29/2016  . PPD Test 11/14/2014, 06/03/2015, 06/17/2015  . Pneumococcal Polysaccharide-23 04/28/2010  . Tdap 08/24/2011  . Zoster 04/13/2010   Pertinent  Health Maintenance Due  Topic Date Due  . DEXA SCAN  01/28/1991  . PNA vac Low Risk Adult (2 of 2 - PCV13) 04/29/2011  . INFLUENZA VACCINE  11/11/2016   Fall Risk  06/20/2015 05/23/2015 11/09/2014 05/10/2014  Falls in the past year? No No No No   Functional Status Survey:    Vitals:   12/08/16 1413  BP: 118/84  Pulse: 80  Resp: 18  Temp: 98.9 F (37.2 C)  Weight: 152 lb 12.8 oz (69.3 kg)  Height: 5\' 5"  (1.651 m)   Body mass index is 25.43 kg/m. Physical Exam  Constitutional: She is oriented to person, place, and time. She appears well-developed and well-nourished. No distress.  Neurological: She is alert and oriented to person,  place, and time. No cranial nerve deficit. She exhibits normal muscle tone. Coordination normal.  Skin: She is not diaphoretic.  Psychiatric: She has a normal mood and affect. Her behavior is normal. Judgment and thought content normal.    Labs reviewed:  Recent Labs  05/14/16 09/01/16 10/08/16  NA 139 137 141  K 4.5 4.0 4.3  BUN 14 14 13   CREATININE 0.7 0.7 1.0    Recent Labs  05/14/16 09/01/16 10/08/16  AST 19 17 23   ALT 17 16 22   ALKPHOS 64 66 57    Recent Labs  08/04/16 09/01/16 10/08/16  WBC 8.0 5.3 7.0  HGB 13.6 12.8 13.9  HCT 39 37 39  PLT 416* 378 375   Lab Results  Component Value Date   TSH 1.42 05/14/2016   Lab Results  Component Value Date   HGBA1C 4.4 11/14/2015   Lab Results  Component Value Date   CHOL 176 05/15/2014   HDL 62 05/15/2014   LDLCALC 95 05/15/2014   LDLDIRECT 114.3 12/08/2007   TRIG 93 05/15/2014   CHOLHDL 2.9 CALC 12/08/2007    Significant Diagnostic Results in last 30 days:  No results found.  Assessment/Plan Cognitive decline 12/06/16 MMSE 21/30 12/08/16 the patient is functioning well in AL, will repeat MMSE in 6 months. Vascular dementia is likely in setting of long standing HTN.   Essential hypertension Controlled, continue Losartan and Amlodipine     Family/ staff Communication: plan of care reviewed with the patient and charge nurse  Labs/tests ordered:  none  Time spend 25 minutes

## 2016-12-08 NOTE — Assessment & Plan Note (Signed)
Controlled, continue Losartan and Amlodipine

## 2016-12-08 NOTE — Assessment & Plan Note (Addendum)
12/06/16 MMSE 21/30 12/08/16 the patient is functioning well in AL, will repeat MMSE in 6 months. Vascular dementia is likely in setting of long standing HTN.

## 2016-12-29 ENCOUNTER — Non-Acute Institutional Stay: Payer: Medicare Other

## 2016-12-29 DIAGNOSIS — Z0000 Encounter for general adult medical examination without abnormal findings: Secondary | ICD-10-CM | POA: Diagnosis not present

## 2016-12-29 NOTE — Patient Instructions (Signed)
Brandy Crawford , Thank you for taking time to come for your Medicare Wellness Visit. I appreciate your ongoing commitment to your health goals. Please review the following plan we discussed and let me know if I can assist you in the future.   Screening recommendations/referrals: Colonoscopy excluded pt is over age 81 Mammogram excluded, pt is over age 85 Bone Density due Recommended yearly ophthalmology/optometry visit for glaucoma screening and checkup Recommended yearly dental visit for hygiene and checkup  Vaccinations: Influenza vaccine due Pneumococcal vaccine 13 due Tdap vaccine up to date. Due 08/23/21 Shingles vaccine not in records    Advanced directives: In Chart  Conditions/risks identified: None  Next appointment: Dr. Bubba Camp makes rounds   Preventive Care 75 Years and Older, Female Preventive care refers to lifestyle choices and visits with your health care provider that can promote health and wellness. What does preventive care include?  A yearly physical exam. This is also called an annual well check.  Dental exams once or twice a year.  Routine eye exams. Ask your health care provider how often you should have your eyes checked.  Personal lifestyle choices, including:  Daily care of your teeth and gums.  Regular physical activity.  Eating a healthy diet.  Avoiding tobacco and drug use.  Limiting alcohol use.  Practicing safe sex.  Taking low-dose aspirin every day.  Taking vitamin and mineral supplements as recommended by your health care provider. What happens during an annual well check? The services and screenings done by your health care provider during your annual well check will depend on your age, overall health, lifestyle risk factors, and family history of disease. Counseling  Your health care provider may ask you questions about your:  Alcohol use.  Tobacco use.  Drug use.  Emotional well-being.  Home and relationship  well-being.  Sexual activity.  Eating habits.  History of falls.  Memory and ability to understand (cognition).  Work and work Statistician.  Reproductive health. Screening  You may have the following tests or measurements:  Height, weight, and BMI.  Blood pressure.  Lipid and cholesterol levels. These may be checked every 5 years, or more frequently if you are over 43 years old.  Skin check.  Lung cancer screening. You may have this screening every year starting at age 80 if you have a 30-pack-year history of smoking and currently smoke or have quit within the past 15 years.  Fecal occult blood test (FOBT) of the stool. You may have this test every year starting at age 66.  Flexible sigmoidoscopy or colonoscopy. You may have a sigmoidoscopy every 5 years or a colonoscopy every 10 years starting at age 58.  Hepatitis C blood test.  Hepatitis B blood test.  Sexually transmitted disease (STD) testing.  Diabetes screening. This is done by checking your blood sugar (glucose) after you have not eaten for a while (fasting). You may have this done every 1-3 years.  Bone density scan. This is done to screen for osteoporosis. You may have this done starting at age 78.  Mammogram. This may be done every 1-2 years. Talk to your health care provider about how often you should have regular mammograms. Talk with your health care provider about your test results, treatment options, and if necessary, the need for more tests. Vaccines  Your health care provider may recommend certain vaccines, such as:  Influenza vaccine. This is recommended every year.  Tetanus, diphtheria, and acellular pertussis (Tdap, Td) vaccine. You may need a Td  booster every 10 years.  Zoster vaccine. You may need this after age 55.  Pneumococcal 13-valent conjugate (PCV13) vaccine. One dose is recommended after age 47.  Pneumococcal polysaccharide (PPSV23) vaccine. One dose is recommended after age  39. Talk to your health care provider about which screenings and vaccines you need and how often you need them. This information is not intended to replace advice given to you by your health care provider. Make sure you discuss any questions you have with your health care provider. Document Released: 04/26/2015 Document Revised: 12/18/2015 Document Reviewed: 01/29/2015 Elsevier Interactive Patient Education  2017 [employer] Prevention in the Home Falls can cause injuries. They can happen to people of all ages. There are many things you can do to make your home safe and to help prevent falls. What can I do on the outside of my home?  Regularly fix the edges of walkways and driveways and fix any cracks.  Remove anything that might make you trip as you walk through a door, such as a raised step or threshold.  Trim any bushes or trees on the path to your home.  Use bright outdoor lighting.  Clear any walking paths of anything that might make someone trip, such as rocks or tools.  Regularly check to see if handrails are loose or broken. Make sure that both sides of any steps have handrails.  Any raised decks and porches should have guardrails on the edges.  Have any leaves, snow, or ice cleared regularly.  Use sand or salt on walking paths during winter.  Clean up any spills in your garage right away. This includes oil or grease spills. What can I do in the bathroom?  Use night lights.  Install grab bars by the toilet and in the tub and shower. Do not use towel bars as grab bars.  Use non-skid mats or decals in the tub or shower.  If you need to sit down in the shower, use a plastic, non-slip stool.  Keep the floor dry. Clean up any water that spills on the floor as soon as it happens.  Remove soap buildup in the tub or shower regularly.  Attach bath mats securely with double-sided non-slip rug tape.  Do not have throw rugs and other things on the floor that can make  you trip. What can I do in the bedroom?  Use night lights.  Make sure that you have a light by your bed that is easy to reach.  Do not use any sheets or blankets that are too big for your bed. They should not hang down onto the floor.  Have a firm chair that has side arms. You can use this for support while you get dressed.  Do not have throw rugs and other things on the floor that can make you trip. What can I do in the kitchen?  Clean up any spills right away.  Avoid walking on wet floors.  Keep items that you use a lot in easy-to-reach places.  If you need to reach something above you, use a strong step stool that has a grab bar.  Keep electrical cords out of the way.  Do not use floor polish or wax that makes floors slippery. If you must use wax, use non-skid floor wax.  Do not have throw rugs and other things on the floor that can make you trip. What can I do with my stairs?  Do not leave any items on the stairs.  Make sure that there are handrails on both sides of the stairs and use them. Fix handrails that are broken or loose. Make sure that handrails are as long as the stairways.  Check any carpeting to make sure that it is firmly attached to the stairs. Fix any carpet that is loose or worn.  Avoid having throw rugs at the top or bottom of the stairs. If you do have throw rugs, attach them to the floor with carpet tape.  Make sure that you have a light switch at the top of the stairs and the bottom of the stairs. If you do not have them, ask someone to add them for you. What else can I do to help prevent falls?  Wear shoes that:  Do not have high heels.  Have rubber bottoms.  Are comfortable and fit you well.  Are closed at the toe. Do not wear sandals.  If you use a stepladder:  Make sure that it is fully opened. Do not climb a closed stepladder.  Make sure that both sides of the stepladder are locked into place.  Ask someone to hold it for you, if  possible.  Clearly mark and make sure that you can see:  Any grab bars or handrails.  First and last steps.  Where the edge of each step is.  Use tools that help you move around (mobility aids) if they are needed. These include:  Canes.  Walkers.  Scooters.  Crutches.  Turn on the lights when you go into a dark area. Replace any light bulbs as soon as they burn out.  Set up your furniture so you have a clear path. Avoid moving your furniture around.  If any of your floors are uneven, fix them.  If there are any pets around you, be aware of where they are.  Review your medicines with your doctor. Some medicines can make you feel dizzy. This can increase your chance of falling. Ask your doctor what other things that you can do to help prevent falls. This information is not intended to replace advice given to you by your health care provider. Make sure you discuss any questions you have with your health care provider. Document Released: 01/24/2009 Document Revised: 09/05/2015 Document Reviewed: 05/04/2014 Elsevier Interactive Patient Education  2017 [employer].

## 2016-12-29 NOTE — Progress Notes (Signed)
Subjective:   Brandy Crawford is a 83 y.o. female who presents for an Initial Medicare Annual Wellness Visit at [facility]       Objective:    Today's Vitals   12/29/16 1207  BP: 132/68  Pulse: 74  Temp: 98.1 F (36.7 C)  TempSrc: Oral  SpO2: 94%  Weight: 153 lb (69.4 kg)  Height: 5\' 5"  (1.651 m)   Body mass index is 25.46 kg/m.   Current Medications (verified) Outpatient Encounter Prescriptions as of 12/29/2016  Medication Sig  . acetaminophen (TYLENOL) 325 MG tablet Take 650 mg by mouth every 4 (four) hours as needed for moderate pain or headache.  . albuterol (PROVENTIL HFA;VENTOLIN HFA) 108 (90 BASE) MCG/ACT inhaler Inhale 2 puffs into the lungs every 6 (six) hours as needed for wheezing or shortness of breath.  Marland Kitchen alum & mag hydroxide-simeth (MAALOX/MYLANTA) 200-200-20 MG/5ML suspension Take 30 mLs by mouth every 4 (four) hours as needed for indigestion or heartburn.   Marland Kitchen amLODipine (NORVASC) 5 MG tablet Take 5 mg by mouth daily with breakfast.   . cetaphil (CETAPHIL) cream Apply 1 application topically daily.  . clobetasol cream (TEMOVATE) 1.44 % Apply 1 application topically 2 (two) times daily as needed.  . loperamide (IMODIUM) 2 MG capsule Take 4 mg by mouth as needed for diarrhea or loose stools. Take one to two tablets by mouth as needed for diarrhea, Max 16 mg/24 hours call MD if Diarrhea continues  . losartan (COZAAR) 100 MG tablet Take 100 mg by mouth daily with breakfast.   . Mirabegron ER (MYRBETRIQ) 25 MG TB24 Take 25 mg by mouth daily with breakfast. Prescribed by urology  . nitroGLYCERIN (NITROSTAT) 0.4 MG SL tablet Place 0.4 mg under the tongue every 5 (five) minutes as needed for chest pain. After taking three times and no relief call MD.  . omeprazole (PRILOSEC) 10 MG capsule Take 10 mg by mouth daily.  . polyethylene glycol (MIRALAX / GLYCOLAX) packet Take 17 g by mouth daily as needed for moderate constipation.   Marland Kitchen pyridOXINE  (VITAMIN B-6) 100 MG tablet Take 100 mg by mouth daily with breakfast.    No facility-administered encounter medications on file as of 12/29/2016.     Allergies (verified) Tramadol; Amoxicillin; Oxycodone; Sulfamethoxazole; and Penicillins   History: Past Medical History:  Diagnosis Date  . Abnormality of gait 04/28/2010   History of fall onto right shoulder-has participated in PT. Uses walker.     . Acute encephalopathy 10/25/14   Occurred when septic  . Acute lower GI bleeding 10/30/2014  . Adjustment disorder with mixed anxiety and depressed mood 06/04/2015   11/15/15 Hgb 12.8, Na 139, K 4.2, Bun 14, creat 0.68, TP 5.7, albumin 3.5, TSH 1.54, Hgb a1c 4.4   . Anemia   . Aortic atherosclerosis ([place]) 10/27/2014  . Asthma   . Cerebral atrophy 10/27/2014  . Cerebrovascular disease 10/27/2014   Small vessel disease  . Cervical disc disease   . Chronic lower back pain 06/04/2015   lumbar spondylosis. Regular f/u Dr. French Ana. Receives injections last 01/2013.  05/27/15 Xray left hip/pelvis: spondylotic changes, severe osteoarthritis Continue Tylenol and Tramadol prn for pain, observe the patient, adding Cymbalta may help. Mid to lower back pain more on the right side back, may consider X-ray of thoracic spine. 06/01/15 CT thoracic spine showed no acute changes.  Patient received 10 injection in the back through Dr. Alvester Morin office. She says the previous problems of discomfort in  the back and down the leg have completely resolved. Takes Aleve 220mg  bid   . Debility 06/20/2015   11/15/15 Hgb 12.8, Na 139, K 4.2, Bun 14, creat 0.68, TP 5.7, albumin 3.5, TSH 1.54, Hgb a1c 4.4   . Degenerative disc disease, lumbar 10/27/2014  . Depression   . Dermatitis 03/25/2016  . Diarrhea 10/25/14  . Diverticulosis 10/27/2014  . Essential hypertension 09/20/2006   11/15/15 Hgb 12.8, Na 139, K 4.2, Bun 14, creat 0.68, TP 5.7, albumin 3.5, TSH 1.54, Hgb a1c 4.4   . Fall at nursing home 10/25/2014  . GERD (gastroesophageal  reflux disease)   . History of cardiovascular disorder 09/20/2006   X 3 in 2000 now on aspirin.    Marland Kitchen Hyperlipidemia 09/20/2006   05/15/14 LDL 95   . Hypertension   . Lumbosacral spondylosis 10/28/2009  . OSTEOPOROSIS 09/20/2006   Noted 2008    . Overactive bladder 11/29/2013   Myrbetriq prescribed by urology.    . Protein calorie malnutrition ([place])    06/06/15 TP 6.0 albumin 2.9  . SCOLIOSIS 10/28/2009  . Scoliosis 10/27/2014  . Thrombocytosis ([place]) 05/31/2015   06/10/15 plt 602 07/23/15 plt 335 11/15/15 Hgb 12.8, Na 139, K 4.2, Bun 14, creat 0.68, TP 5.7, albumin 3.5, TSH 1.54, Hgb a1c 4.4  . TIA (transient ischemic attack) 09/20/2006   Past Surgical History:  Procedure Laterality Date  . ABDOMINAL HYSTERECTOMY    . APPENDECTOMY    . CHOLECYSTECTOMY    . TONSILLECTOMY     Family History  Problem Relation Age of Onset  . Pancreatic cancer Father 63       deceased  . Heart failure Mother 104       deceased  . Leukemia Brother 73       deceased  . Diabetes type II Unknown   . Alcohol abuse Unknown    Social History   Occupational History  . retired Pharmacist, hospital    Social History Main Topics  . Smoking status: Never Smoker  . Smokeless tobacco: Never Used  . Alcohol use No  . Drug use: No  . Sexual activity: No    Tobacco Counseling Counseling given: Not Answered   Activities of Daily Living In your present state of health, do you have any difficulty performing the following activities: 12/29/2016  Hearing? Y  Vision? N  Difficulty concentrating or making decisions? N  Walking or climbing stairs? Y  Dressing or bathing? Y  Doing errands, shopping? Y  Preparing Food and eating ? Y  Using the Toilet? Y  In the past six months, have you accidently leaked urine? Y  Do you have problems with loss of bowel control? Y  Managing your Medications? Y  Managing your Finances? Y  Housekeeping or managing your Housekeeping? Y  Some recent data might be hidden    Immunizations and  Health Maintenance Immunization History  Administered Date(s) Administered  . HiB (PRP-OMP) 02/12/2004  . Influenza Split 01/11/2012  . Influenza Whole 01/07/2009  . Influenza-Unspecified 01/11/2014, 01/01/2015, 01/29/2016  . PPD Test 11/14/2014, 06/03/2015, 06/17/2015  . Pneumococcal Polysaccharide-23 04/28/2010  . Tdap 08/24/2011  . Zoster 04/13/2010   Health Maintenance Due  Topic Date Due  . DEXA SCAN  01/28/1991  . PNA vac Low Risk Adult (2 of 2 - PCV13) 04/29/2011  . INFLUENZA VACCINE  11/11/2016    Patient Care Team: Blanchie Serve, MD as PCP - General (Internal Medicine) [name], [name], NP as Nurse Practitioner (Nurse Practitioner)  Stanton Kidney  any recent Medical Services you may have received from other than [facility] providers in the past year (date may be approximate).     Assessment:   This is a routine wellness examination for [facility].   Hearing/Vision screen No exam data present  Dietary issues and exercise activities discussed: Current Exercise Habits: The patient does not participate in regular exercise at present, Exercise limited by: orthopedic condition(s)  Goals    None     Depression Screen PHQ 2/9 Scores 12/29/2016 06/20/2015 05/23/2015 11/09/2014 05/10/2014  PHQ - 2 Score 0 0 0 0 0    Fall Risk Fall Risk  12/29/2016 06/20/2015 05/23/2015 11/09/2014 05/10/2014  Falls in the past year? No No No No No    Cognitive Function: MMSE - Mini Mental State Exam 12/08/2016 12/08/2016 06/24/2016  Orientation to time 2 2 3   Orientation to Place 5 5 5   Registration 3 3 3   Attention/ Calculation 2 2 5   Recall 0 0 0  Language- name 2 objects 2 2 2   Language- repeat 1 1 1   Language- follow 3 step command 3 3 3   Language- read & follow direction 1 1 1   Write a sentence 1 1 1   Copy design 1 1 1   Total score 21 21 25         Screening Tests Health Maintenance  Topic Date Due  . DEXA SCAN  01/28/1991  . PNA vac Low Risk Adult (2 of 2 - PCV13) 04/29/2011  . INFLUENZA  VACCINE  11/11/2016  . TETANUS/TDAP  08/23/2021      Plan:    I have personally reviewed and addressed the Medicare Annual Wellness questionnaire and have noted the following in the patient's chart:  A. Medical and social history B. Use of alcohol, tobacco or illicit drugs  C. Current medications and supplements D. Functional ability and status E.  Nutritional status F.  Physical activity G. Advance directives H. List of other physicians I.  Hospitalizations, surgeries, and ER visits in previous 12 months J.  [place] to include hearing, vision, cognitive, depression L. Referrals and appointments - none  In addition, I have reviewed and discussed with patient certain preventive protocols, quality metrics, and best practice recommendations. A written personalized care plan for preventive services as well as general preventive health recommendations were provided to patient.  See attached scanned questionnaire for additional information.   Signed,   Rich Reining, RN Nurse Health Advisor   Quick Notes   Health Maintenance: PNA 13 and DEXA due     Abnormal Screen: MMSE 21/30 on 12/08/16     Patient Concerns: None     Nurse Concerns: None

## 2016-12-31 DIAGNOSIS — M810 Age-related osteoporosis without current pathological fracture: Secondary | ICD-10-CM | POA: Diagnosis not present

## 2016-12-31 LAB — HM DEXA SCAN: HM Dexa Scan: -4.2

## 2017-01-01 ENCOUNTER — Non-Acute Institutional Stay: Payer: Medicare Other | Admitting: Internal Medicine

## 2017-01-01 ENCOUNTER — Encounter: Payer: Self-pay | Admitting: Internal Medicine

## 2017-01-01 DIAGNOSIS — G8929 Other chronic pain: Secondary | ICD-10-CM | POA: Diagnosis not present

## 2017-01-01 DIAGNOSIS — M545 Low back pain: Secondary | ICD-10-CM | POA: Diagnosis not present

## 2017-01-01 DIAGNOSIS — M810 Age-related osteoporosis without current pathological fracture: Secondary | ICD-10-CM

## 2017-01-01 NOTE — Progress Notes (Signed)
Location:  [facility] Number: [place] of Service:  ALF [phone]) Provider:  Blanchie Serve MD  Blanchie Serve, MD  Patient Care Team: Blanchie Serve, MD as PCP - General (Internal Medicine) [name], [name], NP as Nurse Practitioner (Nurse Practitioner)  Extended Emergency Contact Information Primary Emergency Contact: [name] Address: [place], [place] [address] [name] of [place] Phone: [phone] Work Phone: [phone] Mobile Phone: [phone] Relation: Daughter Secondary Emergency Contact: [name] Address: [address] [place] [address] [name] of [place] Phone: [phone] Mobile Phone: [phone] Relation: Son  Code Status:  DNR Goals of care: Advanced Directive information Advanced Directives 01/01/2017  Does Patient Have a Medical Advance Directive? Yes  Type of Paramedic of [place];Living will;Out of facility DNR (pink MOST or yellow form)  Does patient want to make changes to medical advance directive? No - Patient declined  Copy of [place] in Chart? Yes  Pre-existing out of facility DNR order (yellow form or pink MOST form) Yellow form placed in chart (order not valid for inpatient use);Pink MOST form placed in chart (order not valid for inpatient use)     Chief Complaint  Patient presents with  . Acute Visit    Abnormal dexa scan     HPI:  Pt is a 83 y.o. female seen today for acute visit for abnormal dexa scan. She denies any fall. She gets around with her walker. She is very hard of hearing.    Past Medical History:  Diagnosis Date  . Abnormality of gait 04/28/2010   History of fall onto right shoulder-has participated in PT. Uses walker.     . Acute encephalopathy 10/25/14   Occurred when septic  . Acute lower GI bleeding 10/30/2014  . Adjustment disorder with mixed anxiety and depressed mood  06/04/2015   11/15/15 Hgb 12.8, Na 139, K 4.2, Bun 14, creat 0.68, TP 5.7, albumin 3.5, TSH 1.54, Hgb a1c 4.4   . Anemia   . Aortic atherosclerosis ([place]) 10/27/2014  . Asthma   . Cerebral atrophy 10/27/2014  . Cerebrovascular disease 10/27/2014   Small vessel disease  . Cervical disc disease   . Chronic lower back pain 06/04/2015   lumbar spondylosis. Regular f/u Dr. French Ana. Receives injections last 01/2013.  05/27/15 Xray left hip/pelvis: spondylotic changes, severe osteoarthritis Continue Tylenol and Tramadol prn for pain, observe the patient, adding Cymbalta may help. Mid to lower back pain more on the right side back, may consider X-ray of thoracic spine. 06/01/15 CT thoracic spine showed no acute changes.  Patient received 10 injection in the back through Dr. Alvester Morin office. She says the previous problems of discomfort in the back and down the leg have completely resolved. Takes Aleve 220mg  bid   . Debility 06/20/2015   11/15/15 Hgb 12.8, Na 139, K 4.2, Bun 14, creat 0.68, TP 5.7, albumin 3.5, TSH 1.54, Hgb a1c 4.4   . Degenerative disc disease, lumbar 10/27/2014  . Depression   . Dermatitis 03/25/2016  . Diarrhea 10/25/14  . Diverticulosis 10/27/2014  . Essential hypertension 09/20/2006   11/15/15 Hgb 12.8, Na 139, K 4.2, Bun 14, creat 0.68, TP 5.7, albumin 3.5, TSH 1.54, Hgb a1c 4.4   . Fall at nursing home 10/25/2014  . GERD (gastroesophageal reflux disease)   . History of cardiovascular disorder 09/20/2006  X 3 in 2000 now on aspirin.    Marland Kitchen Hyperlipidemia 09/20/2006   05/15/14 LDL 95   . Hypertension   . Lumbosacral spondylosis 10/28/2009  . OSTEOPOROSIS 09/20/2006   Noted 2008    . Overactive bladder 11/29/2013   Myrbetriq prescribed by urology.    . Protein calorie malnutrition ([place])    06/06/15 TP 6.0 albumin 2.9  . SCOLIOSIS 10/28/2009  . Scoliosis 10/27/2014  . Thrombocytosis ([place]) 05/31/2015   06/10/15 plt 602 07/23/15 plt 335 11/15/15 Hgb 12.8, Na 139, K 4.2, Bun 14, creat 0.68, TP 5.7,  albumin 3.5, TSH 1.54, Hgb a1c 4.4  . TIA (transient ischemic attack) 09/20/2006   Past Surgical History:  Procedure Laterality Date  . ABDOMINAL HYSTERECTOMY    . APPENDECTOMY    . CHOLECYSTECTOMY    . TONSILLECTOMY      Allergies  Allergen Reactions  . Tramadol Other (See Comments)    Hallucinations, agitation/combativeness  . Amoxicillin     Per MAR  . Oxycodone Other (See Comments)    Per MAR   . Sulfamethoxazole     Per MAR   . Penicillins Rash    Outpatient Encounter Prescriptions as of 01/01/2017  Medication Sig  . acetaminophen (TYLENOL) 325 MG tablet Take 650 mg by mouth every 4 (four) hours as needed for moderate pain or headache.  . albuterol (PROVENTIL HFA;VENTOLIN HFA) 108 (90 BASE) MCG/ACT inhaler Inhale 2 puffs into the lungs every 6 (six) hours as needed for wheezing or shortness of breath.  Marland Kitchen alum & mag hydroxide-simeth (MAALOX/MYLANTA) 200-200-20 MG/5ML suspension Take 30 mLs by mouth every 4 (four) hours as needed for indigestion or heartburn.   Marland Kitchen amLODipine (NORVASC) 5 MG tablet Take 5 mg by mouth daily with breakfast.   . cetaphil (CETAPHIL) cream Apply 1 application topically daily.  . clobetasol cream (TEMOVATE) 2.13 % Apply 1 application topically 2 (two) times daily as needed.  . loperamide (IMODIUM) 2 MG capsule Take 4 mg by mouth as needed for diarrhea or loose stools. Take one to two tablets by mouth as needed for diarrhea, Max 16 mg/24 hours call MD if Diarrhea continues  . losartan (COZAAR) 100 MG tablet Take 100 mg by mouth daily with breakfast.   . Mirabegron ER (MYRBETRIQ) 25 MG TB24 Take 25 mg by mouth daily with breakfast. Prescribed by urology  . mirtazapine (REMERON) 7.5 MG tablet Take 7.5 mg by mouth at bedtime.  . nitroGLYCERIN (NITROSTAT) 0.4 MG SL tablet Place 0.4 mg under the tongue every 5 (five) minutes as needed for chest pain. After taking three times and no relief call MD.  . omeprazole (PRILOSEC) 10 MG capsule Take 10 mg by mouth  daily.  . polyethylene glycol (MIRALAX / GLYCOLAX) packet Take 17 g by mouth daily as needed for moderate constipation.   Marland Kitchen pyridOXINE (VITAMIN B-6) 100 MG tablet Take 100 mg by mouth daily with breakfast.    No facility-administered encounter medications on file as of 01/01/2017.     Review of Systems  Constitutional: Negative for appetite change.  HENT: Positive for hearing loss. Negative for congestion and trouble swallowing.   Eyes: Negative for visual disturbance.  Respiratory: Negative for shortness of breath.   Cardiovascular: Positive for leg swelling. Negative for chest pain and palpitations.  Gastrointestinal: Negative for abdominal pain, constipation, nausea and vomiting.  Musculoskeletal: Positive for back pain and gait problem.  Neurological: Negative for seizures, light-headedness and headaches.  Psychiatric/Behavioral: Positive for confusion. Negative for behavioral problems.  Immunization History  Administered Date(s) Administered  . HiB (PRP-OMP) 02/12/2004  . Influenza Split 01/11/2012  . Influenza Whole 01/07/2009  . Influenza-Unspecified 01/11/2014, 01/01/2015, 01/29/2016  . PPD Test 11/14/2014, 06/03/2015, 06/17/2015  . Pneumococcal Polysaccharide-23 04/28/2010  . Tdap 08/24/2011  . Zoster 04/13/2010   Pertinent  Health Maintenance Due  Topic Date Due  . INFLUENZA VACCINE  01/11/2017 (Originally 11/11/2016)  . PNA vac Low Risk Adult (2 of 2 - PCV13) 02/11/2017 (Originally 04/29/2011)  . DEXA SCAN  Completed   Fall Risk  12/29/2016 06/20/2015 05/23/2015 11/09/2014 05/10/2014  Falls in the past year? No No No No No   Functional Status Survey:    Vitals:   01/01/17 1411  BP: 128/74  Pulse: 70  Resp: 18  Temp: 98 F (36.7 C)  TempSrc: Oral  Weight: 152 lb 9.6 oz (69.2 kg)  Height: 5\' 5"  (1.651 m)   Body mass index is 25.39 kg/m. Physical Exam  Constitutional: She appears well-developed and well-nourished. No distress.  HENT:  Head: Normocephalic and  atraumatic.  Mouth/Throat: Oropharynx is clear and moist.  Has hearing aid  Eyes: EOM are normal.  Has corrective lenses  Neck: Normal range of motion. Neck supple.  Cardiovascular: Normal rate and regular rhythm.   Pulmonary/Chest: Breath sounds normal.  Abdominal: Soft. Bowel sounds are normal.  Musculoskeletal: She exhibits edema.  Can move all 4 extremities, unsteady gait, uses a walker, trace leg edema  Lymphadenopathy:    She has no cervical adenopathy.  Neurological: She is alert.  Alert and oriented to person and place but not to time  Skin: Skin is warm and dry. No rash noted. She is not diaphoretic. No erythema.  Psychiatric: She has a normal mood and affect.  Pleasantly confused    Labs reviewed:  Recent Labs  05/14/16 09/01/16 10/08/16  NA 139 137 141  K 4.5 4.0 4.3  BUN 14 14 13   CREATININE 0.7 0.7 1.0    Recent Labs  05/14/16 09/01/16 10/08/16  AST 19 17 23   ALT 17 16 22   ALKPHOS 64 66 57    Recent Labs  08/04/16 09/01/16 10/08/16  WBC 8.0 5.3 7.0  HGB 13.6 12.8 13.9  HCT 39 37 39  PLT 416* 378 375   Lab Results  Component Value Date   TSH 1.42 05/14/2016   Lab Results  Component Value Date   HGBA1C 4.4 11/14/2015   Lab Results  Component Value Date   CHOL 176 05/15/2014   HDL 62 05/15/2014   LDLCALC 95 05/15/2014   LDLDIRECT 114.3 12/08/2007   TRIG 93 05/15/2014   CHOLHDL 2.9 CALC 12/08/2007    Significant Diagnostic Results in last 30 days:  No results found.   12/31/16 dexa scan t score -4.2  Assessment/Plan  Osteoporosis With t score of -4.2. Will start her on fosamax 70 mg once a week for now with calcium and vitamin d supplement. Monitor clinically. Gets around with her walker- fall precautions.   Lumbar back pain Chronic, stable, on tylenol 650 mg q4h prn, has not required it, monitor    Family/ staff Communication: reviewed care plan with patient and charge nurse.    Labs/tests ordered:  none   Blanchie Serve,  MD Internal Medicine [facility] [address] [place], [place] [address] Cell Phone (Monday-Friday 8 am - 5 pm): [phone] On Call: [phone] and follow prompts after 5 pm and on weekends Office Phone: [phone] Office Fax: [phone]

## 2017-01-07 ENCOUNTER — Non-Acute Institutional Stay: Payer: Medicare Other | Admitting: Internal Medicine

## 2017-01-07 ENCOUNTER — Encounter: Payer: Self-pay | Admitting: Internal Medicine

## 2017-01-07 DIAGNOSIS — L600 Ingrowing nail: Secondary | ICD-10-CM

## 2017-01-07 DIAGNOSIS — M79674 Pain in right toe(s): Secondary | ICD-10-CM

## 2017-01-07 NOTE — Progress Notes (Signed)
Location:  [place] Room Number: [place] of Service:  ALF [phone]) Provider:  Blanchie Serve, MD  Blanchie Serve, MD  Patient Care Team: Blanchie Serve, MD as PCP - General (Internal Medicine) [name], [name], NP as Nurse Practitioner (Nurse Practitioner)  Extended Emergency Contact Information Primary Emergency Contact: [name] Address: [place], [place] [address] [name] of [place] Phone: [phone] Work Phone: [phone] Mobile Phone: [phone] Relation: Daughter Secondary Emergency Contact: [name] Address: [address] [place] [address] [name] of [place] Phone: [phone] Mobile Phone: [phone] Relation: Son  Code Status:  DNR Goals of care: Advanced Directive information Advanced Directives 01/07/2017  Does Patient Have a Medical Advance Directive? Yes  Type of Paramedic of [place];Living will;Out of facility DNR (pink MOST or yellow form)  Does patient want to make changes to medical advance directive? No - Patient declined  Copy of [place] in Chart? Yes  Pre-existing out of facility DNR order (yellow form or pink MOST form) Yellow form placed in chart (order not valid for inpatient use)     Chief Complaint  Patient presents with  . Acute Visit    Right great toe pain     HPI:  Pt is a 83 y.o. female seen today for an acute visit for pain to right great toe x 2 days. She complaints of pain to side of her right great toe. Denies trauma or injury. Denies fever or chills.    Past Medical History:  Diagnosis Date  . Abnormality of gait 04/28/2010   History of fall onto right shoulder-has participated in PT. Uses walker.     . Acute encephalopathy 10/25/14   Occurred when septic  . Acute lower GI bleeding 10/30/2014  . Adjustment disorder with mixed anxiety and depressed mood 06/04/2015   11/15/15 Hgb  12.8, Na 139, K 4.2, Bun 14, creat 0.68, TP 5.7, albumin 3.5, TSH 1.54, Hgb a1c 4.4   . Anemia   . Aortic atherosclerosis ([place]) 10/27/2014  . Asthma   . Cerebral atrophy 10/27/2014  . Cerebrovascular disease 10/27/2014   Small vessel disease  . Cervical disc disease   . Chronic lower back pain 06/04/2015   lumbar spondylosis. Regular f/u Dr. French Ana. Receives injections last 01/2013.  05/27/15 Xray left hip/pelvis: spondylotic changes, severe osteoarthritis Continue Tylenol and Tramadol prn for pain, observe the patient, adding Cymbalta may help. Mid to lower back pain more on the right side back, may consider X-ray of thoracic spine. 06/01/15 CT thoracic spine showed no acute changes.  Patient received 10 injection in the back through Dr. Alvester Morin office. She says the previous problems of discomfort in the back and down the leg have completely resolved. Takes Aleve 220mg  bid   . Debility 06/20/2015   11/15/15 Hgb 12.8, Na 139, K 4.2, Bun 14, creat 0.68, TP 5.7, albumin 3.5, TSH 1.54, Hgb a1c 4.4   . Degenerative disc disease, lumbar 10/27/2014  . Depression   . Dermatitis 03/25/2016  . Diarrhea 10/25/14  . Diverticulosis 10/27/2014  . Essential hypertension 09/20/2006   11/15/15 Hgb 12.8, Na 139, K 4.2, Bun 14, creat 0.68, TP 5.7, albumin 3.5, TSH 1.54, Hgb a1c 4.4   . Fall at nursing home 10/25/2014  . GERD (gastroesophageal reflux disease)   . History of cardiovascular disorder 09/20/2006  X 3 in 2000 now on aspirin.    Marland Kitchen Hyperlipidemia 09/20/2006   05/15/14 LDL 95   . Hypertension   . Lumbosacral spondylosis 10/28/2009  . OSTEOPOROSIS 09/20/2006   Noted 2008    . Overactive bladder 11/29/2013   Myrbetriq prescribed by urology.    . Protein calorie malnutrition ([place])    06/06/15 TP 6.0 albumin 2.9  . SCOLIOSIS 10/28/2009  . Scoliosis 10/27/2014  . Thrombocytosis ([place]) 05/31/2015   06/10/15 plt 602 07/23/15 plt 335 11/15/15 Hgb 12.8, Na 139, K 4.2, Bun 14, creat 0.68, TP 5.7, albumin 3.5, TSH 1.54, Hgb a1c  4.4  . TIA (transient ischemic attack) 09/20/2006   Past Surgical History:  Procedure Laterality Date  . ABDOMINAL HYSTERECTOMY    . APPENDECTOMY    . CHOLECYSTECTOMY    . TONSILLECTOMY      Allergies  Allergen Reactions  . Tramadol Other (See Comments)    Hallucinations, agitation/combativeness  . Amoxicillin     Per MAR  . Oxycodone Other (See Comments)    Per MAR   . Sulfamethoxazole     Per MAR   . Penicillins Rash    Outpatient Encounter Prescriptions as of 01/07/2017  Medication Sig  . acetaminophen (TYLENOL) 325 MG tablet Take 650 mg by mouth every 4 (four) hours as needed for moderate pain or headache.  . albuterol (PROVENTIL HFA;VENTOLIN HFA) 108 (90 BASE) MCG/ACT inhaler Inhale 2 puffs into the lungs every 6 (six) hours as needed for wheezing or shortness of breath.  Marland Kitchen alendronate (FOSAMAX) 70 MG tablet Take 70 mg by mouth once a week. Take with a full glass of water on an empty stomach.  Marland Kitchen alum & mag hydroxide-simeth (MAALOX/MYLANTA) 200-200-20 MG/5ML suspension Take 30 mLs by mouth every 4 (four) hours as needed for indigestion or heartburn.   Marland Kitchen amLODipine (NORVASC) 5 MG tablet Take 5 mg by mouth daily with breakfast.   . calcium carbonate (OSCAL) 1500 (600 Ca) MG TABS tablet Take 600 mg of elemental calcium by mouth 2 (two) times daily with a meal.  . cetaphil (CETAPHIL) cream Apply 1 application topically daily.  . clobetasol cream (TEMOVATE) 2.95 % Apply 1 application topically 2 (two) times daily as needed.  . loperamide (IMODIUM) 2 MG capsule Take 4 mg by mouth as needed for diarrhea or loose stools. Take one to two tablets by mouth as needed for diarrhea, Max 16 mg/24 hours call MD if Diarrhea continues  . losartan (COZAAR) 100 MG tablet Take 100 mg by mouth daily with breakfast.   . Mirabegron ER (MYRBETRIQ) 25 MG TB24 Take 25 mg by mouth daily with breakfast. Prescribed by urology  . mirtazapine (REMERON) 7.5 MG tablet Take 7.5 mg by mouth at bedtime.  .  nitroGLYCERIN (NITROSTAT) 0.4 MG SL tablet Place 0.4 mg under the tongue every 5 (five) minutes as needed for chest pain. After taking three times and no relief call MD.  . omeprazole (PRILOSEC) 10 MG capsule Take 10 mg by mouth daily.  . polyethylene glycol (MIRALAX / GLYCOLAX) packet Take 17 g by mouth daily as needed for moderate constipation.   Marland Kitchen pyridOXINE (VITAMIN B-6) 100 MG tablet Take 100 mg by mouth daily with breakfast.    No facility-administered encounter medications on file as of 01/07/2017.     Review of Systems  Constitutional: Negative for chills and fever.  Respiratory: Negative for shortness of breath.   Cardiovascular: Negative for chest pain and leg swelling.  Gastrointestinal: Negative for abdominal  pain, nausea and vomiting.  Musculoskeletal: Negative for gait problem.  Skin: Negative for rash.  Neurological: Negative for headaches.  Psychiatric/Behavioral: Negative for behavioral problems.    Immunization History  Administered Date(s) Administered  . HiB (PRP-OMP) 02/12/2004  . Influenza Split 01/11/2012  . Influenza Whole 01/07/2009  . Influenza-Unspecified 01/11/2014, 01/01/2015, 01/29/2016  . PPD Test 11/14/2014, 06/03/2015, 06/17/2015  . Pneumococcal Polysaccharide-23 04/28/2010  . Tdap 08/24/2011  . Zoster 04/13/2010   Pertinent  Health Maintenance Due  Topic Date Due  . INFLUENZA VACCINE  01/11/2017 (Originally 11/11/2016)  . PNA vac Low Risk Adult (2 of 2 - PCV13) 02/11/2017 (Originally 04/29/2011)  . DEXA SCAN  Completed   Fall Risk  12/29/2016 06/20/2015 05/23/2015 11/09/2014 05/10/2014  Falls in the past year? No No No No No   Functional Status Survey:    Vitals:   01/07/17 1201  BP: 126/72  Pulse: 70  Resp: 20  Temp: 98.2 F (36.8 C)  TempSrc: Oral  Weight: 152 lb 9.6 oz (69.2 kg)  Height: 5\' 5"  (1.651 m)   Body mass index is 25.39 kg/m. Physical Exam  Constitutional: She appears well-developed and well-nourished. No distress.  HENT:    Head: Normocephalic and atraumatic.  Mouth/Throat: Oropharynx is clear and moist.  Neck: Neck supple.  Cardiovascular: Normal rate and regular rhythm.   Pulmonary/Chest: Effort normal and breath sounds normal.  Abdominal: Soft. Bowel sounds are normal.  Musculoskeletal: She exhibits edema.  Can move all 4 extremities, blanchable redness to tip of all toes. Tenderness to palpation of right great toe medial side, no redness or drainage around toe area. Skin intact. Good distal pulse.   Lymphadenopathy:    She has no cervical adenopathy.  Neurological: She is alert.  Skin: Skin is warm and dry. She is not diaphoretic.  Psychiatric: She has a normal mood and affect.    Labs reviewed:  Recent Labs  05/14/16 09/01/16 10/08/16  NA 139 137 141  K 4.5 4.0 4.3  BUN 14 14 13   CREATININE 0.7 0.7 1.0    Recent Labs  05/14/16 09/01/16 10/08/16  AST 19 17 23   ALT 17 16 22   ALKPHOS 64 66 57    Recent Labs  08/04/16 09/01/16 10/08/16  WBC 8.0 5.3 7.0  HGB 13.6 12.8 13.9  HCT 39 37 39  PLT 416* 378 375   Lab Results  Component Value Date   TSH 1.42 05/14/2016   Lab Results  Component Value Date   HGBA1C 4.4 11/14/2015   Lab Results  Component Value Date   CHOL 176 05/15/2014   HDL 62 05/15/2014   LDLCALC 95 05/15/2014   LDLDIRECT 114.3 12/08/2007   TRIG 93 05/15/2014   CHOLHDL 2.9 CALC 12/08/2007    Significant Diagnostic Results in last 30 days:  No results found.  Assessment/Plan  Right toe pain No signs of infection or compromised circulation. Normal joint movement. Pain From ingrown toe nail. See below.  Ingrown toe nail Has ingrown toe nail causing pain to right toe. No signs of infection. Podiatry referral. To wear open front end shoes for now. Continue prn tylenol  Family/ staff Communication: reviewed care plan with patient and charge nurse.    Labs/tests ordered:  Podiatry referral  Blanchie Serve, MD Internal Medicine [facility] [place], [place] [address] Cell Phone (Monday-Friday 8 am - 5 pm): [phone] On Call: [phone] and follow prompts after 5 pm and on weekends Office Phone:  [phone] Office Fax: [phone]

## 2017-01-08 NOTE — Addendum Note (Signed)
Addended byBlanchie Serve on: 01/08/2017 04:44 PM   Modules accepted: Level of Service

## 2017-03-05 ENCOUNTER — Non-Acute Institutional Stay: Payer: Medicare Other | Admitting: Nurse Practitioner

## 2017-03-05 ENCOUNTER — Encounter: Payer: Self-pay | Admitting: Nurse Practitioner

## 2017-03-05 DIAGNOSIS — M810 Age-related osteoporosis without current pathological fracture: Secondary | ICD-10-CM | POA: Diagnosis not present

## 2017-03-05 DIAGNOSIS — J410 Simple chronic bronchitis: Secondary | ICD-10-CM

## 2017-03-05 DIAGNOSIS — I10 Essential (primary) hypertension: Secondary | ICD-10-CM

## 2017-03-05 DIAGNOSIS — N318 Other neuromuscular dysfunction of bladder: Secondary | ICD-10-CM | POA: Diagnosis not present

## 2017-03-05 DIAGNOSIS — R4189 Other symptoms and signs involving cognitive functions and awareness: Secondary | ICD-10-CM | POA: Diagnosis not present

## 2017-03-05 DIAGNOSIS — K219 Gastro-esophageal reflux disease without esophagitis: Secondary | ICD-10-CM

## 2017-03-05 DIAGNOSIS — F4323 Adjustment disorder with mixed anxiety and depressed mood: Secondary | ICD-10-CM

## 2017-03-05 NOTE — Assessment & Plan Note (Signed)
Resides in AL, ambulates with walker, self transfer, last MMSE 21/30 12/06/16

## 2017-03-05 NOTE — Assessment & Plan Note (Signed)
depression/insomnia: stable, continue Mirtazapine 7.5mg  qd.

## 2017-03-05 NOTE — Assessment & Plan Note (Signed)
Occasional hacking cough and mild wheezes at end of expiration, prn Albuterol HFA q6h available to her, no O2 desaturation, her symptoms do not wake her up from night sleep.

## 2017-03-05 NOTE — Progress Notes (Signed)
Location:   [place] Room Number: 630 Place of Service: [place] [facility] Provider:  [facility] Lillar Bianca NP  Blanchie Serve, MD  Patient Care Team: Blanchie Serve, MD as PCP - General (Internal Medicine) Natale Barba X, NP as Nurse Practitioner (Nurse Practitioner)  Extended Emergency Contact Information Primary Emergency Contact: [name] Address: [place], [place] [address] [name] of [place] Phone: [phone] Work Phone: [phone] Mobile Phone: [phone] Relation: Daughter Secondary Emergency Contact: [name] Address: [address] [place] [address] [name] of [place] Phone: [phone] Mobile Phone: [phone] Relation: Son  Code Status:  DNR Goals of care: Advanced Directive information Advanced Directives 01/07/2017  Does Patient Have a Medical Advance Directive? Yes  Type of Paramedic of [place];Living will;Out of facility DNR (pink MOST or yellow form)  Does patient want to make changes to medical advance directive? No - Patient declined  Copy of [place] in Chart? Yes  Pre-existing out of facility DNR order (yellow form or pink MOST form) Yellow form placed in chart (order not valid for inpatient use)     Chief Complaint  Patient presents with  . Medical Management of Chronic Issues    HPI:  Pt is a 83 y.o. female seen today for medical management of chronic diseases.      The patient has history of Osteoporosis, taking Foxamax 70mg  weekly, Vit D/Ca 400/600 bid. GERD stable on Omeprazole 10mg  qd, depression/insomnia: stable on Mirtazapine 7.5mg  qd. Urinary frequency is well managed on Myrbetriq 25mg  daily, average urination x1 per night. Her blood pressure is controlled, on Losartan 100mg  qd, Amlodipine 5mg  daily. Prn Albuterol HFA q6h for occasional wheezes/cough.   Past Medical History:  Diagnosis Date  . Abnormality of gait 04/28/2010    History of fall onto right shoulder-has participated in PT. Uses walker.     . Acute encephalopathy 10/25/14   Occurred when septic  . Acute lower GI bleeding 10/30/2014  . Adjustment disorder with mixed anxiety and depressed mood 06/04/2015   11/15/15 Hgb 12.8, Na 139, K 4.2, Bun 14, creat 0.68, TP 5.7, albumin 3.5, TSH 1.54, Hgb a1c 4.4   . Anemia   . Aortic atherosclerosis ([place]) 10/27/2014  . Asthma   . Cerebral atrophy 10/27/2014  . Cerebrovascular disease 10/27/2014   Small vessel disease  . Cervical disc disease   . Chronic lower back pain 06/04/2015   lumbar spondylosis. Regular f/u Dr. French Ana. Receives injections last 01/2013.  05/27/15 Xray left hip/pelvis: spondylotic changes, severe osteoarthritis Continue Tylenol and Tramadol prn for pain, observe the patient, adding Cymbalta may help. Mid to lower back pain more on the right side back, may consider X-ray of thoracic spine. 06/01/15 CT thoracic spine showed no acute changes.  Patient received 10 injection in the back through Dr. Alvester Morin office. She says the previous problems of discomfort in the back and down the leg have completely resolved. Takes Aleve 220mg  bid   . Debility 06/20/2015   11/15/15 Hgb 12.8, Na 139, K 4.2, Bun 14, creat 0.68, TP 5.7, albumin 3.5, TSH 1.54, Hgb a1c 4.4   . Degenerative disc disease, lumbar 10/27/2014  . Depression   . Dermatitis 03/25/2016  . Diarrhea 10/25/14  . Diverticulosis 10/27/2014  . Essential hypertension 09/20/2006   11/15/15 Hgb 12.8, Na 139, K 4.2, Bun 14, creat 0.68, TP 5.7, albumin  3.5, TSH 1.54, Hgb a1c 4.4   . Fall at nursing home 10/25/2014  . GERD (gastroesophageal reflux disease)   . History of cardiovascular disorder 09/20/2006   X 3 in 2000 now on aspirin.    Marland Kitchen Hyperlipidemia 09/20/2006   05/15/14 LDL 95   . Hypertension   . Lumbosacral spondylosis 10/28/2009  . OSTEOPOROSIS 09/20/2006   Noted 2008    . Overactive bladder 11/29/2013   Myrbetriq prescribed by urology.    . Protein  calorie malnutrition ([place])    06/06/15 TP 6.0 albumin 2.9  . SCOLIOSIS 10/28/2009  . Scoliosis 10/27/2014  . Thrombocytosis ([place]) 05/31/2015   06/10/15 plt 602 07/23/15 plt 335 11/15/15 Hgb 12.8, Na 139, K 4.2, Bun 14, creat 0.68, TP 5.7, albumin 3.5, TSH 1.54, Hgb a1c 4.4  . TIA (transient ischemic attack) 09/20/2006   Past Surgical History:  Procedure Laterality Date  . ABDOMINAL HYSTERECTOMY    . APPENDECTOMY    . CHOLECYSTECTOMY    . TONSILLECTOMY      Allergies  Allergen Reactions  . Tramadol Other (See Comments)    Hallucinations, agitation/combativeness  . Amoxicillin     Per MAR  . Oxycodone Other (See Comments)    Per MAR   . Sulfamethoxazole     Per MAR   . Penicillins Rash    Allergies as of 03/05/2017      Reactions   Tramadol Other (See Comments)   Hallucinations, agitation/combativeness   Amoxicillin    Per MAR   Oxycodone Other (See Comments)   Per MAR    Sulfamethoxazole    Per MAR    Penicillins Rash      Medication List        Accurate as of 03/05/17 11:59 PM. Always use your most recent med list.          acetaminophen 325 MG tablet Commonly known as:  TYLENOL Take 650 mg by mouth every 4 (four) hours as needed for moderate pain or headache.   albuterol 108 (90 Base) MCG/ACT inhaler Commonly known as:  PROVENTIL HFA;VENTOLIN HFA Inhale 2 puffs into the lungs every 6 (six) hours as needed for wheezing or shortness of breath.   alendronate 70 MG tablet Commonly known as:  FOSAMAX Take 70 mg by mouth once a week. Take with a full glass of water on an empty stomach.   alum & mag hydroxide-simeth 200-200-20 MG/5ML suspension Commonly known as:  MAALOX/MYLANTA Take 30 mLs by mouth every 4 (four) hours as needed for indigestion or heartburn.   amLODipine 5 MG tablet Commonly known as:  NORVASC Take 5 mg by mouth daily with breakfast.   calcium carbonate 1500 (600 Ca) MG Tabs tablet Commonly known as:  OSCAL Take 600 mg of elemental calcium by  mouth 2 (two) times daily with a meal.   cetaphil cream Apply 1 application topically daily.   clobetasol cream 0.05 % Commonly known as:  TEMOVATE Apply 1 application topically 2 (two) times daily as needed.   loperamide 2 MG capsule Commonly known as:  IMODIUM Take 4 mg by mouth as needed for diarrhea or loose stools. Take one to two tablets by mouth as needed for diarrhea, Max 16 mg/24 hours call MD if Diarrhea continues   losartan 100 MG tablet Commonly known as:  COZAAR Take 100 mg by mouth daily with breakfast.   mirtazapine 7.5 MG tablet Commonly known as:  REMERON Take 7.5 mg by mouth at bedtime.   MYRBETRIQ 25 MG  Tb24 tablet Generic drug:  mirabegron ER Take 25 mg by mouth daily with breakfast. Prescribed by urology   nitroGLYCERIN 0.4 MG SL tablet Commonly known as:  NITROSTAT Place 0.4 mg under the tongue every 5 (five) minutes as needed for chest pain. After taking three times and no relief call MD.   omeprazole 10 MG capsule Commonly known as:  PRILOSEC Take 10 mg by mouth daily.   polyethylene glycol packet Commonly known as:  MIRALAX / GLYCOLAX Take 17 g by mouth daily as needed for moderate constipation.   pyridOXINE 100 MG tablet Commonly known as:  VITAMIN B-6 Take 100 mg by mouth daily with breakfast.       Review of Systems  Constitutional: Negative for activity change, appetite change, chills, diaphoresis, fatigue and fever.  HENT: Positive for hearing loss. Negative for rhinorrhea, trouble swallowing and voice change.   Eyes: Negative for visual disturbance.  Respiratory: Positive for cough and wheezing. Negative for choking, chest tightness and shortness of breath.        Occasional hacking cough or expiratory wheezes  Cardiovascular: Positive for leg swelling.  Gastrointestinal: Negative for abdominal pain, constipation, diarrhea, nausea and vomiting.  Endocrine: Negative for cold intolerance.  Genitourinary: Negative for difficulty  urinating, dysuria, frequency and urgency.       Average urination x1 per night.   Musculoskeletal: Positive for gait problem. Negative for arthralgias and back pain.       Ambulates with walker.   Skin: Negative for color change, pallor, rash and wound.  Neurological: Negative for dizziness, tremors, speech difficulty, weakness and headaches.       Forgetful.   Psychiatric/Behavioral: Negative for agitation, behavioral problems, confusion, hallucinations and sleep disturbance. The patient is not nervous/anxious.     Immunization History  Administered Date(s) Administered  . HiB (PRP-OMP) 02/12/2004  . Influenza Split 01/11/2012  . Influenza Whole 01/07/2009  . Influenza-Unspecified 01/11/2014, 01/01/2015, 01/29/2016, 02/01/2017  . PPD Test 11/14/2014, 06/03/2015, 06/17/2015  . Pneumococcal Conjugate-13 02/08/2017  . Pneumococcal Polysaccharide-23 04/28/2010  . Tdap 08/24/2011  . Zoster 04/13/2010   Pertinent  Health Maintenance Due  Topic Date Due  . INFLUENZA VACCINE  Completed  . DEXA SCAN  Completed  . PNA vac Low Risk Adult  Completed   Fall Risk  12/29/2016 06/20/2015 05/23/2015 11/09/2014 05/10/2014  Falls in the past year? No No No No No   Functional Status Survey:    Vitals:   03/05/17 1545  BP: 132/78  Pulse: 76  Resp: 20  Temp: (!) 97.2 F (36.2 C)   There is no height or weight on file to calculate BMI. Physical Exam  Constitutional: She is oriented to person, place, and time. She appears well-developed and well-nourished.  HENT:  Head: Normocephalic and atraumatic.  Eyes: Conjunctivae and EOM are normal. Pupils are equal, round, and reactive to light.  Neck: Normal range of motion. Neck supple. No JVD present. No thyromegaly present.  Cardiovascular: Normal rate, regular rhythm and normal heart sounds.  No murmur heard. Pulmonary/Chest: Effort normal. She has wheezes. She has no rales.  Decreased air entry. Occasional wheezes at the end of expiration.     Abdominal: Soft. Bowel sounds are normal. She exhibits no distension. There is no tenderness.  Musculoskeletal: Normal range of motion. She exhibits edema. She exhibits no tenderness.  Trace edema in ankles. Ambulates with walker. Transfer self.   Neurological: She is alert and oriented to person, place, and time. She exhibits normal muscle tone. Coordination  normal.  Skin: Skin is warm and dry. No rash noted. No erythema. No pallor.  Psychiatric: She has a normal mood and affect. Her behavior is normal. Judgment and thought content normal.    Labs reviewed: Recent Labs    05/14/16 09/01/16 10/08/16  NA 139 137 141  K 4.5 4.0 4.3  BUN 14 14 13   CREATININE 0.7 0.7 1.0   Recent Labs    05/14/16 09/01/16 10/08/16  AST 19 17 23   ALT 17 16 22   ALKPHOS 64 66 57   Recent Labs    08/04/16 09/01/16 10/08/16  WBC 8.0 5.3 7.0  HGB 13.6 12.8 13.9  HCT 39 37 39  PLT 416* 378 375   Lab Results  Component Value Date   TSH 1.42 05/14/2016   Lab Results  Component Value Date   HGBA1C 4.4 11/14/2015   Lab Results  Component Value Date   CHOL 176 05/15/2014   HDL 62 05/15/2014   LDLCALC 95 05/15/2014   LDLDIRECT 114.3 12/08/2007   TRIG 93 05/15/2014   CHOLHDL 2.9 CALC 12/08/2007    Significant Diagnostic Results in last 30 days:  No results found.  Assessment/Plan  Essential hypertension Her blood pressure is controlled, continue Losartan 100mg  qd, Amlodipine 5mg  daily.  Update CMP  GERD (gastroesophageal reflux disease) GERD stable, continue Omeprazole 10mg  qd, update CBC   Osteoporosis without current pathological fracture The patient has history of Osteoporosis, continue Foxamax 70mg  weekly, Vit D/Ca 400/600 bid.   Hypertonicity of bladder Urinary frequency is well managed, continue Myrbetriq 25mg  daily, average urination x1 per night.    Adjustment disorder with mixed anxiety and depressed mood depression/insomnia: stable, continue Mirtazapine 7.5mg  qd.     Cognitive decline Resides in AL, ambulates with walker, self transfer, last MMSE 21/30 12/06/16  COPD (chronic obstructive pulmonary disease) (HCC) Occasional hacking cough and mild wheezes at end of expiration, prn Albuterol HFA q6h available to her, no O2 desaturation, her symptoms do not wake her up from night sleep.    Family/ staff Communication: plan of care reviewed with the patient and charge nurse  Labs/tests ordered:  CBC CMP  Time spend 25 minutes

## 2017-03-05 NOTE — Assessment & Plan Note (Addendum)
Her blood pressure is controlled, continue Losartan 100mg  qd, Amlodipine 5mg  daily.  Update CMP

## 2017-03-05 NOTE — Assessment & Plan Note (Signed)
The patient has history of Osteoporosis, continue Foxamax 70mg  weekly, Vit D/Ca 400/600 bid.

## 2017-03-05 NOTE — Assessment & Plan Note (Addendum)
Urinary frequency is well managed, continue Myrbetriq 25mg  daily, average urination x1 per night.

## 2017-03-05 NOTE — Assessment & Plan Note (Signed)
GERD stable, continue Omeprazole 10mg  qd, update CBC

## 2017-03-09 DIAGNOSIS — D473 Essential (hemorrhagic) thrombocythemia: Secondary | ICD-10-CM | POA: Diagnosis not present

## 2017-03-09 DIAGNOSIS — I10 Essential (primary) hypertension: Secondary | ICD-10-CM | POA: Diagnosis not present

## 2017-03-09 LAB — BASIC METABOLIC PANEL
BUN: 14 (ref 4–21)
Creatinine: 0.8 (ref <=1.1)
GLUCOSE: 97
POTASSIUM: 4.2 (ref 3.4–5.3)
Sodium: 141 (ref 137–147)

## 2017-03-09 LAB — CBC AND DIFFERENTIAL
HCT: 43 (ref 36–46)
HEMOGLOBIN: 15 (ref 12.0–16.0)
Platelets: 392 (ref 150–399)
WBC: 8.6

## 2017-03-09 LAB — HEPATIC FUNCTION PANEL
ALT: 23 (ref 7–35)
AST: 25 (ref 13–35)
Alkaline Phosphatase: 61 (ref 25–125)
Bilirubin, Total: 0.7

## 2017-03-10 ENCOUNTER — Other Ambulatory Visit: Payer: Self-pay | Admitting: *Deleted

## 2017-04-21 DIAGNOSIS — L821 Other seborrheic keratosis: Secondary | ICD-10-CM | POA: Diagnosis not present

## 2017-04-21 DIAGNOSIS — D1801 Hemangioma of skin and subcutaneous tissue: Secondary | ICD-10-CM | POA: Diagnosis not present

## 2017-04-21 DIAGNOSIS — L814 Other melanin hyperpigmentation: Secondary | ICD-10-CM | POA: Diagnosis not present

## 2017-06-22 ENCOUNTER — Encounter: Payer: Self-pay | Admitting: Internal Medicine

## 2017-06-22 ENCOUNTER — Non-Acute Institutional Stay: Payer: Medicare Other | Admitting: Internal Medicine

## 2017-06-22 DIAGNOSIS — M19012 Primary osteoarthritis, left shoulder: Secondary | ICD-10-CM | POA: Insufficient documentation

## 2017-06-22 DIAGNOSIS — M810 Age-related osteoporosis without current pathological fracture: Secondary | ICD-10-CM | POA: Diagnosis not present

## 2017-06-22 DIAGNOSIS — I10 Essential (primary) hypertension: Secondary | ICD-10-CM

## 2017-06-22 DIAGNOSIS — F03A0 Unspecified dementia, mild, without behavioral disturbance, psychotic disturbance, mood disturbance, and anxiety: Secondary | ICD-10-CM

## 2017-06-22 DIAGNOSIS — F03B0 Unspecified dementia, moderate, without behavioral disturbance, psychotic disturbance, mood disturbance, and anxiety: Secondary | ICD-10-CM | POA: Insufficient documentation

## 2017-06-22 DIAGNOSIS — K5901 Slow transit constipation: Secondary | ICD-10-CM

## 2017-06-22 DIAGNOSIS — F0390 Unspecified dementia without behavioral disturbance: Secondary | ICD-10-CM

## 2017-06-22 DIAGNOSIS — K219 Gastro-esophageal reflux disease without esophagitis: Secondary | ICD-10-CM

## 2017-06-22 NOTE — Progress Notes (Signed)
Location:  [place] Room Number: [place] of Service:  ALF [phone]) Provider:  Blanchie Serve MD  Blanchie Serve, MD  Patient Care Team: Blanchie Serve, MD as PCP - General (Internal Medicine) [name], [name], NP as Nurse Practitioner (Nurse Practitioner)  Extended Emergency Contact Information Primary Emergency Contact: [name] Address: [place], [place] [address] [name] of [place] Phone: [phone] Work Phone: [phone] Mobile Phone: [phone] Relation: Daughter Secondary Emergency Contact: [name] Address: [address] [place] [address] [name] of [place] Phone: [phone] Mobile Phone: [phone] Relation: Son  Code Status:  DNR  Goals of care: Advanced Directive information Advanced Directives 06/22/2017  Does Patient Have a Medical Advance Directive? Yes  Type of Paramedic of [place];Living will;Out of facility DNR (pink MOST or yellow form)  Does patient want to make changes to medical advance directive? No - Patient declined  Copy of [place] in Chart? Yes  Pre-existing out of facility DNR order (yellow form or pink MOST form) Yellow form placed in chart (order not valid for inpatient use)     Chief Complaint  Patient presents with  . Medical Management of Chronic Issues    Routine Visit     HPI:  Pt is a 84 y.o. female seen today for medical management of chronic diseases. She is reading her newspaper. She denies any concern this visit. No acute concern from nursing. She ambulates with her walker.    Osteoporosis- no recent fall reported. Currently on weekly fosamax with oscal, tolerating well.  Hypertension- takes amlodipine 5 mg daily and losartan 100 mg daily.   Left shoulder OA- currently on tylenol 650 mg q4h prn pain  gerd- denies any reflux symptom or epigastric pain. No trouble with  swallowing. Currently on famotidine 20 mg daily  Constipation- stable, moved her bowel this am. Takes miralax as needed.     Past Medical History:  Diagnosis Date  . Abnormality of gait 04/28/2010   History of fall onto right shoulder-has participated in PT. Uses walker.     . Acute encephalopathy 10/25/14   Occurred when septic  . Acute lower GI bleeding 10/30/2014  . Adjustment disorder with mixed anxiety and depressed mood 06/04/2015   11/15/15 Hgb 12.8, Na 139, K 4.2, Bun 14, creat 0.68, TP 5.7, albumin 3.5, TSH 1.54, Hgb a1c 4.4   . Anemia   . Aortic atherosclerosis ([place]) 10/27/2014  . Asthma   . Cerebral atrophy 10/27/2014  . Cerebrovascular disease 10/27/2014   Small vessel disease  . Cervical disc disease   . Chronic lower back pain 06/04/2015   lumbar spondylosis. Regular f/u Dr. French Ana. Receives injections last 01/2013.  05/27/15 Xray left hip/pelvis: spondylotic changes, severe osteoarthritis Continue Tylenol and Tramadol prn for pain, observe the patient, adding Cymbalta may help. Mid to lower back pain more on the right side back, may consider X-ray of thoracic spine. 06/01/15 CT thoracic spine showed no acute changes.  Patient received 10 injection in the back through Dr. Alvester Morin office. She says the previous problems of discomfort in the back and down the leg have completely resolved. Takes Aleve 220mg  bid   . Debility 06/20/2015   11/15/15 Hgb 12.8, Na 139, K 4.2, Bun 14, creat 0.68, TP 5.7, albumin 3.5, TSH 1.54, Hgb a1c 4.4   . Degenerative disc  disease, lumbar 10/27/2014  . Depression   . Dermatitis 03/25/2016  . Diarrhea 10/25/14  . Diverticulosis 10/27/2014  . Essential hypertension 09/20/2006   11/15/15 Hgb 12.8, Na 139, K 4.2, Bun 14, creat 0.68, TP 5.7, albumin 3.5, TSH 1.54, Hgb a1c 4.4   . Fall at nursing home 10/25/2014  . GERD (gastroesophageal reflux disease)   . History of cardiovascular disorder 09/20/2006   X 3 in 2000 now on aspirin.    Marland Kitchen Hyperlipidemia 09/20/2006     05/15/14 LDL 95   . Hypertension   . Lumbosacral spondylosis 10/28/2009  . OSTEOPOROSIS 09/20/2006   Noted 2008    . Overactive bladder 11/29/2013   Myrbetriq prescribed by urology.    . Protein calorie malnutrition ([place])    06/06/15 TP 6.0 albumin 2.9  . SCOLIOSIS 10/28/2009  . Scoliosis 10/27/2014  . Thrombocytosis ([place]) 05/31/2015   06/10/15 plt 602 07/23/15 plt 335 11/15/15 Hgb 12.8, Na 139, K 4.2, Bun 14, creat 0.68, TP 5.7, albumin 3.5, TSH 1.54, Hgb a1c 4.4  . TIA (transient ischemic attack) 09/20/2006   Past Surgical History:  Procedure Laterality Date  . ABDOMINAL HYSTERECTOMY    . APPENDECTOMY    . CHOLECYSTECTOMY    . TONSILLECTOMY      Allergies  Allergen Reactions  . Tramadol Other (See Comments)    Hallucinations, agitation/combativeness  . Amoxicillin     Per MAR  . Oxycodone Other (See Comments)    Per MAR   . Sulfamethoxazole     Per MAR   . Penicillins Rash    Outpatient Encounter Medications as of 06/22/2017  Medication Sig  . acetaminophen (TYLENOL) 325 MG tablet Take 650 mg by mouth every 4 (four) hours as needed for moderate pain or headache.  . albuterol (PROVENTIL HFA;VENTOLIN HFA) 108 (90 BASE) MCG/ACT inhaler Inhale 2 puffs into the lungs every 6 (six) hours as needed for wheezing or shortness of breath.  Marland Kitchen alendronate (FOSAMAX) 70 MG tablet Take 70 mg by mouth once a week. Take with a full glass of water on an empty stomach.  Marland Kitchen alum & mag hydroxide-simeth (MAALOX/MYLANTA) 200-200-20 MG/5ML suspension Take 30 mLs by mouth every 4 (four) hours as needed for indigestion or heartburn.   Marland Kitchen amLODipine (NORVASC) 5 MG tablet Take 5 mg by mouth daily with breakfast.   . calcium carbonate (OSCAL) 1500 (600 Ca) MG TABS tablet Take 600 mg of elemental calcium by mouth 2 (two) times daily with a meal.  . cetaphil (CETAPHIL) cream Apply 1 application topically daily.  . clobetasol cream (TEMOVATE) 9.62 % Apply 1 application topically 2 (two) times daily as needed.  .  famotidine (PEPCID) 20 MG tablet Take 20 mg by mouth at bedtime.  Marland Kitchen loperamide (IMODIUM) 2 MG capsule Take 4 mg by mouth as needed for diarrhea or loose stools. Take one to two tablets by mouth as needed for diarrhea, Max 16 mg/24 hours call MD if Diarrhea continues  . losartan (COZAAR) 100 MG tablet Take 100 mg by mouth daily with breakfast.   . Mirabegron ER (MYRBETRIQ) 25 MG TB24 Take 25 mg by mouth daily with breakfast. Prescribed by urology  . mirtazapine (REMERON) 7.5 MG tablet Take 7.5 mg by mouth at bedtime.  . nitroGLYCERIN (NITROSTAT) 0.4 MG SL tablet Place 0.4 mg under the tongue every 5 (five) minutes as needed for chest pain. After taking three times and no relief call MD.  . polyethylene glycol (MIRALAX / GLYCOLAX) packet Take 17 g by  mouth daily as needed for moderate constipation.   Marland Kitchen pyridOXINE (VITAMIN B-6) 100 MG tablet Take 100 mg by mouth daily with breakfast.   . [DISCONTINUED] omeprazole (PRILOSEC) 10 MG capsule Take 10 mg by mouth daily.   No facility-administered encounter medications on file as of 06/22/2017.     Review of Systems  Constitutional: Negative for appetite change, chills and fever.  HENT: Positive for hearing loss and rhinorrhea. Negative for congestion, mouth sores, sore throat and trouble swallowing.   Respiratory: Negative for cough and shortness of breath.   Cardiovascular: Negative for chest pain and palpitations.  Gastrointestinal: Positive for constipation. Negative for abdominal pain, diarrhea, nausea and vomiting.  Genitourinary: Negative for dysuria and hematuria.       Continent with her bowel and bladder  Musculoskeletal: Positive for arthralgias and gait problem. Negative for back pain.  Skin: Negative for rash.  Neurological: Negative for dizziness and headaches.  Psychiatric/Behavioral: Positive for confusion.    Immunization History  Administered Date(s) Administered  . HiB (PRP-OMP) 02/12/2004  . Influenza Split 01/11/2012  .  Influenza Whole 01/07/2009  . Influenza-Unspecified 01/11/2014, 01/01/2015, 01/29/2016, 02/01/2017  . PPD Test 11/14/2014, 06/03/2015, 06/17/2015  . Pneumococcal Conjugate-13 02/08/2017  . Pneumococcal Polysaccharide-23 04/28/2010  . Tdap 08/24/2011  . Zoster 04/13/2010   Pertinent  Health Maintenance Due  Topic Date Due  . INFLUENZA VACCINE  Completed  . DEXA SCAN  Completed  . PNA vac Low Risk Adult  Completed   Fall Risk  12/29/2016 11/02/2016 06/20/2015 05/23/2015 11/09/2014  Falls in the past year? No Yes No No No  Comment - Emmi Telephone Survey: data to providers prior to load - - -  Number falls in past yr: - 2 or more - - -  Comment - Emmi Telephone Survey Actual Response = 90 - - -  Injury with Fall? - Yes - - -   Functional Status Survey:    Vitals:   06/22/17 1322  BP: 132/72  Pulse: 68  Resp: 18  Temp: 98.3 F (36.8 C)  TempSrc: Oral  SpO2: 96%  Weight: 155 lb 9.6 oz (70.6 kg)  Height: 5' 5.5" (1.664 m)   Body mass index is 25.5 kg/m.   Wt Readings from Last 3 Encounters:  06/22/17 155 lb 9.6 oz (70.6 kg)  01/07/17 152 lb 9.6 oz (69.2 kg)  01/01/17 152 lb 9.6 oz (69.2 kg)   12/08/2016 mmse 21/30  Physical Exam  Constitutional: She appears well-developed and well-nourished. No distress.  HENT:  Head: Normocephalic and atraumatic.  Nose: Nose normal.  Mouth/Throat: Oropharynx is clear and moist. No oropharyngeal exudate.  Eyes: Conjunctivae and EOM are normal. Pupils are equal, round, and reactive to light. Right eye exhibits no discharge. Left eye exhibits no discharge.  Neck: Normal range of motion. Neck supple.  Cardiovascular: Normal rate, regular rhythm and intact distal pulses.  Pulmonary/Chest: Effort normal and breath sounds normal. No respiratory distress. She has no wheezes. She has no rales.  Abdominal: Soft. Bowel sounds are normal. There is no tenderness. There is no guarding.  Musculoskeletal: She exhibits edema.  Trace leg edema, able to  move all 4 extremities, limited ROM with left shoulder, unsteady gait, uses walker  Lymphadenopathy:    She has no cervical adenopathy.  Neurological: She is alert.  Oriented to person and place but not to time.   Skin: Skin is warm. She is not diaphoretic.  Psychiatric: She has a normal mood and affect.    Labs  reviewed: Recent Labs    09/01/16 10/08/16 03/09/17  NA 137 141 141  K 4.0 4.3 4.2  BUN 14 13 14   CREATININE 0.7 1.0 0.8   Recent Labs    09/01/16 10/08/16 03/09/17  AST 17 23 25   ALT 16 22 23   ALKPHOS 66 57 61   Recent Labs    09/01/16 10/08/16 03/09/17  WBC 5.3 7.0 8.6  HGB 12.8 13.9 15.0  HCT 37 39 43  PLT 378 375 392   Lab Results  Component Value Date   TSH 1.42 05/14/2016   Lab Results  Component Value Date   HGBA1C 4.4 11/14/2015   Lab Results  Component Value Date   CHOL 176 05/15/2014   HDL 62 05/15/2014   LDLCALC 95 05/15/2014   LDLDIRECT 114.3 12/08/2007   TRIG 93 05/15/2014   CHOLHDL 2.9 CALC 12/08/2007    10/25/2014 CT Head without contrast- scattered area of periventricular white matter lucency consistent with chronic small vessel ischemic disease. Diffuse cortical atrophy with secondary ventricular dilatation slightly progressed since 2007.    Assessment/Plan  1. Essential hypertension Stable BP 120-136/64-72 on review. Continue amlodipine and losartan. Check BMP  2. Gastroesophageal reflux disease without esophagitis Controlled, continue famotidine  3. Age-related osteoporosis without current pathological fracture Continue weekly fosamax with daily oscal. Check vitamin d level. Fall precautions.   4. Slow transit constipation Continue miralax as needed. Continue hydration  5. Osteoarthritis of left shoulder, unspecified osteoarthritis type Continue tylenol as needed and ROM exercise as tolerated. Check vit d level  6. Mild dementia MMSE from 11/2016 reviewed. Has history of HTN and vascular changes noted on CT head. Age  component might also be contributing some. Controlled BP. Start donepezil 5 mg daily for now and reassess in 6 weeks with MMSE. Monitor for behavior changes. Check b12 and TSH   Family/ staff Communication: reviewed care plan with patient and charge nurse.    Labs/tests ordered:  Cmp, tsh, vitamin d, b12   Blanchie Serve, MD Internal Medicine [facility] [address] [place], [place] [address] Cell Phone (Monday-Friday 8 am - 5 pm): [phone] On Call: [phone] and follow prompts after 5 pm and on weekends Office Phone: [phone] Office Fax: [phone]

## 2017-06-24 LAB — HEPATIC FUNCTION PANEL
ALK PHOS: 47 (ref 25–125)
AST: 25 (ref 13–35)
Bilirubin, Total: 0.7

## 2017-06-24 LAB — BASIC METABOLIC PANEL
BUN: 16 (ref 4–21)
Creatinine: 0.8 (ref <=1.1)
GLUCOSE: 96
Potassium: 4.2 (ref 3.4–5.3)
Sodium: 139 (ref 137–147)

## 2017-06-24 LAB — TSH: TSH: 1.9 (ref <=5.90)

## 2017-06-24 LAB — VITAMIN D 25 HYDROXY (VIT D DEFICIENCY, FRACTURES): Vit D, 25-Hydroxy: 23

## 2017-06-24 LAB — VITAMIN B12: VITAMIN B 12: 260

## 2017-06-28 ENCOUNTER — Other Ambulatory Visit: Payer: Self-pay | Admitting: *Deleted

## 2017-06-28 ENCOUNTER — Encounter: Payer: Self-pay | Admitting: Nurse Practitioner

## 2017-06-28 DIAGNOSIS — E559 Vitamin D deficiency, unspecified: Secondary | ICD-10-CM | POA: Insufficient documentation

## 2017-07-02 DIAGNOSIS — M25571 Pain in right ankle and joints of right foot: Secondary | ICD-10-CM | POA: Diagnosis not present

## 2017-07-02 DIAGNOSIS — M25561 Pain in right knee: Secondary | ICD-10-CM | POA: Diagnosis not present

## 2017-07-05 ENCOUNTER — Non-Acute Institutional Stay: Payer: Medicare Other | Admitting: Nurse Practitioner

## 2017-07-05 ENCOUNTER — Encounter: Payer: Self-pay | Admitting: Nurse Practitioner

## 2017-07-05 DIAGNOSIS — W19XXXA Unspecified fall, initial encounter: Secondary | ICD-10-CM | POA: Diagnosis not present

## 2017-07-05 DIAGNOSIS — F03A0 Unspecified dementia, mild, without behavioral disturbance, psychotic disturbance, mood disturbance, and anxiety: Secondary | ICD-10-CM

## 2017-07-05 DIAGNOSIS — E559 Vitamin D deficiency, unspecified: Secondary | ICD-10-CM | POA: Diagnosis not present

## 2017-07-05 DIAGNOSIS — M7989 Other specified soft tissue disorders: Secondary | ICD-10-CM | POA: Diagnosis not present

## 2017-07-05 DIAGNOSIS — G90521 Complex regional pain syndrome I of right lower limb: Secondary | ICD-10-CM | POA: Insufficient documentation

## 2017-07-05 DIAGNOSIS — F0390 Unspecified dementia without behavioral disturbance: Secondary | ICD-10-CM | POA: Diagnosis not present

## 2017-07-05 DIAGNOSIS — M810 Age-related osteoporosis without current pathological fracture: Secondary | ICD-10-CM

## 2017-07-05 DIAGNOSIS — M79661 Pain in right lower leg: Secondary | ICD-10-CM | POA: Diagnosis not present

## 2017-07-05 NOTE — Assessment & Plan Note (Signed)
She has limited safety awareness, close supervision for safety, continue Donepezil 5mg  daily for memory.

## 2017-07-05 NOTE — Assessment & Plan Note (Addendum)
Currently treated with Fosamax, Vit D, Ca, risk for fractures.

## 2017-07-05 NOTE — Assessment & Plan Note (Signed)
fall 07/02/17 when the patient was found on floor in bathroom with legs bent back under her, noted skin tear lateral left lower leg, well approximated with steri-strips closures, no sign of infection. She denied dizziness, chest pain, palpitation, or focal weakness associated with falling. Unsteady gait and limited safety awareness are contributory to falling, close supervision for safety.

## 2017-07-05 NOTE — Progress Notes (Signed)
Location:  [place] Room Number: 322 Place of Service:  ALF [phone]) Provider:  [name], Manxie  NP  Blanchie Serve, MD  Patient Care Team: Blanchie Serve, MD as PCP - General (Internal Medicine) [name], [name], NP as Nurse Practitioner (Nurse Practitioner)  Extended Emergency Contact Information Primary Emergency Contact: [name] Address: [place], [place] [address] [name] of [place] Phone: [phone] Work Phone: [phone] Mobile Phone: [phone] Relation: Daughter Secondary Emergency Contact: [name] Address: [address] [place] [address] [name] of [place] Phone: [phone] Mobile Phone: [phone] Relation: Son  Code Status:  DNR Goals of care: Advanced Directive information Advanced Directives 06/22/2017  Does Patient Have a Medical Advance Directive? Yes  Type of Paramedic of [place];Living will;Out of facility DNR (pink MOST or yellow form)  Does patient want to make changes to medical advance directive? No - Patient declined  Copy of [place] in Chart? Yes  Pre-existing out of facility DNR order (yellow form or pink MOST form) Yellow form placed in chart (order not valid for inpatient use)     Chief Complaint  Patient presents with  . Acute Visit    Fall--> edema to (R) knee and ankle    HPI:  Pt is a 84 y.o. female seen today for an acute visit for    Past Medical History:  Diagnosis Date  . Abnormality of gait 04/28/2010   History of fall onto right shoulder-has participated in PT. Uses walker.     . Acute encephalopathy 10/25/14   Occurred when septic  . Acute lower GI bleeding 10/30/2014  . Adjustment disorder with mixed anxiety and depressed mood 06/04/2015   11/15/15 Hgb 12.8, Na 139, K 4.2, Bun 14, creat 0.68, TP 5.7, albumin 3.5, TSH 1.54, Hgb a1c 4.4   . Anemia   . Aortic atherosclerosis  ([place]) 10/27/2014  . Asthma   . Cerebral atrophy 10/27/2014  . Cerebrovascular disease 10/27/2014   Small vessel disease  . Cervical disc disease   . Chronic lower back pain 06/04/2015   lumbar spondylosis. Regular f/u Dr. French Ana. Receives injections last 01/2013.  05/27/15 Xray left hip/pelvis: spondylotic changes, severe osteoarthritis Continue Tylenol and Tramadol prn for pain, observe the patient, adding Cymbalta may help. Mid to lower back pain more on the right side back, may consider X-ray of thoracic spine. 06/01/15 CT thoracic spine showed no acute changes.  Patient received 10 injection in the back through Dr. Alvester Morin office. She says the previous problems of discomfort in the back and down the leg have completely resolved. Takes Aleve 220mg  bid   . Debility 06/20/2015   11/15/15 Hgb 12.8, Na 139, K 4.2, Bun 14, creat 0.68, TP 5.7, albumin 3.5, TSH 1.54, Hgb a1c 4.4   . Degenerative disc disease, lumbar 10/27/2014  . Depression   . Dermatitis 03/25/2016  . Diarrhea 10/25/14  . Diverticulosis 10/27/2014  . Essential hypertension 09/20/2006   11/15/15 Hgb 12.8, Na 139, K 4.2, Bun 14, creat 0.68, TP 5.7, albumin 3.5, TSH 1.54, Hgb a1c 4.4   . Fall at nursing home 10/25/2014  . GERD (gastroesophageal reflux disease)   . History of cardiovascular disorder 09/20/2006   X 3 in 2000 now on aspirin.    Marland Kitchen Hyperlipidemia 09/20/2006   05/15/14 LDL 95   . Hypertension   .  Lumbosacral spondylosis 10/28/2009  . OSTEOPOROSIS 09/20/2006   Noted 2008    . Overactive bladder 11/29/2013   Myrbetriq prescribed by urology.    . Protein calorie malnutrition ([place])    06/06/15 TP 6.0 albumin 2.9  . SCOLIOSIS 10/28/2009  . Scoliosis 10/27/2014  . Thrombocytosis ([place]) 05/31/2015   06/10/15 plt 602 07/23/15 plt 335 11/15/15 Hgb 12.8, Na 139, K 4.2, Bun 14, creat 0.68, TP 5.7, albumin 3.5, TSH 1.54, Hgb a1c 4.4  . TIA (transient ischemic attack) 09/20/2006   Past Surgical History:  Procedure Laterality Date  . ABDOMINAL  HYSTERECTOMY    . APPENDECTOMY    . CHOLECYSTECTOMY    . TONSILLECTOMY      Allergies  Allergen Reactions  . Tramadol Other (See Comments)    Hallucinations, agitation/combativeness  . Amoxicillin     Per MAR  . Oxycodone Other (See Comments)    Per MAR   . Sulfamethoxazole     Per MAR   . Penicillins Rash    Outpatient Encounter Medications as of 07/05/2017  Medication Sig  . acetaminophen (TYLENOL) 325 MG tablet Take 650 mg by mouth every 4 (four) hours as needed for moderate pain or headache.  . albuterol (PROVENTIL HFA;VENTOLIN HFA) 108 (90 BASE) MCG/ACT inhaler Inhale 2 puffs into the lungs every 6 (six) hours as needed for wheezing or shortness of breath.  Marland Kitchen alendronate (FOSAMAX) 70 MG tablet Take 70 mg by mouth once a week. Take with a full glass of water on an empty stomach.  Marland Kitchen alum & mag hydroxide-simeth (MAALOX/MYLANTA) 200-200-20 MG/5ML suspension Take 30 mLs by mouth every 4 (four) hours as needed for indigestion or heartburn.   Marland Kitchen amLODipine (NORVASC) 5 MG tablet Take 5 mg by mouth daily with breakfast.   . calcium carbonate (OSCAL) 1500 (600 Ca) MG TABS tablet Take 600 mg of elemental calcium by mouth 2 (two) times daily with a meal.  . cetaphil (CETAPHIL) cream Apply 1 application topically daily.  . clobetasol cream (TEMOVATE) 8.29 % Apply 1 application topically 2 (two) times daily as needed.  . donepezil (ARICEPT) 5 MG tablet Take 5 mg by mouth at bedtime.  . famotidine (PEPCID) 20 MG tablet Take 20 mg by mouth at bedtime.  Marland Kitchen loperamide (IMODIUM) 2 MG capsule Take 4 mg by mouth as needed for diarrhea or loose stools. Take one to two tablets by mouth as needed for diarrhea, Max 16 mg/24 hours call MD if Diarrhea continues  . losartan (COZAAR) 100 MG tablet Take 100 mg by mouth daily with breakfast.   . Mirabegron ER (MYRBETRIQ) 25 MG TB24 Take 25 mg by mouth daily with breakfast. Prescribed by urology  . mirtazapine (REMERON) 7.5 MG tablet Take 7.5 mg by mouth at  bedtime.  . nitroGLYCERIN (NITROSTAT) 0.4 MG SL tablet Place 0.4 mg under the tongue every 5 (five) minutes as needed for chest pain. After taking three times and no relief call MD.  . polyethylene glycol (MIRALAX / GLYCOLAX) packet Take 17 g by mouth daily as needed for moderate constipation.   Marland Kitchen pyridOXINE (VITAMIN B-6) 100 MG tablet Take 100 mg by mouth daily with breakfast.    No facility-administered encounter medications on file as of 07/05/2017.     Review of Systems  Immunization History  Administered Date(s) Administered  . HiB (PRP-OMP) 02/12/2004  . Influenza Split 01/11/2012  . Influenza Whole 01/07/2009  . Influenza-Unspecified 01/11/2014, 01/01/2015, 01/29/2016, 02/01/2017  . PPD Test 11/14/2014, 06/03/2015, 06/17/2015  . Pneumococcal  Conjugate-13 02/08/2017  . Pneumococcal Polysaccharide-23 04/28/2010  . Tdap 08/24/2011  . Zoster 04/13/2010   Pertinent  Health Maintenance Due  Topic Date Due  . INFLUENZA VACCINE  Completed  . DEXA SCAN  Completed  . PNA vac Low Risk Adult  Completed   Fall Risk  12/29/2016 11/02/2016 06/20/2015 05/23/2015 11/09/2014  Falls in the past year? No Yes No No No  Comment - Emmi Telephone Survey: data to providers prior to load - - -  Number falls in past yr: - 2 or more - - -  Comment - Emmi Telephone Survey Actual Response = 90 - - -  Injury with Fall? - Yes - - -   Functional Status Survey:    Vitals:   07/05/17 1110  BP: 138/70  Pulse: 76  Resp: 18  Temp: 98.7 F (37.1 C)  SpO2: 96%  Weight: 155 lb 9.6 oz (70.6 kg)  Height: 5' 5.5" (1.664 m)   Body mass index is 25.5 kg/m. Physical Exam  Labs reviewed: Recent Labs    10/08/16 03/09/17 06/24/17  NA 141 141 139  K 4.3 4.2 4.2  BUN 13 14 16   CREATININE 1.0 0.8 0.8   Recent Labs    09/01/16 10/08/16 03/09/17 06/24/17  AST 17 23 25 25   ALT 16 22 23   --   ALKPHOS 66 57 61 47   Recent Labs    09/01/16 10/08/16 03/09/17  WBC 5.3 7.0 8.6  HGB 12.8 13.9 15.0  HCT 37 39  43  PLT 378 375 392   Lab Results  Component Value Date   TSH 1.90 06/24/2017   Lab Results  Component Value Date   HGBA1C 4.4 11/14/2015   Lab Results  Component Value Date   CHOL 176 05/15/2014   HDL 62 05/15/2014   LDLCALC 95 05/15/2014   LDLDIRECT 114.3 12/08/2007   TRIG 93 05/15/2014   CHOLHDL 2.9 CALC 12/08/2007    Significant Diagnostic Results in last 30 days:  No results found.  Assessment/Plan There are no diagnoses linked to this encounter.   Family/ staff Communication:   Labs/tests ordered:

## 2017-07-05 NOTE — Progress Notes (Signed)
Location:   [facility] Room Number: 301 Place of Service:  ALF (13) Provider: Lennie Odor Aslee Such NP  Blanchie Serve, MD  Patient Care Team: Blanchie Serve, MD as PCP - General (Internal Medicine) Mattie Novosel X, NP as Nurse Practitioner (Nurse Practitioner)  Extended Emergency Contact Information Primary Emergency Contact: [name] Address: [place], [place] [address] [name] of [place] Phone: [phone] Work Phone: [phone] Mobile Phone: [phone] Relation: Daughter Secondary Emergency Contact: [name] Address: [address] [place] [address] [name] of [place] Phone: [phone] Mobile Phone: [phone] Relation: Son  Code Status: DNR Goals of care: Advanced Directive information Advanced Directives 06/22/2017  Does Patient Have a Medical Advance Directive? Yes  Type of Paramedic of [place];Living will;Out of facility DNR (pink MOST or yellow form)  Does patient want to make changes to medical advance directive? No - Patient declined  Copy of [place] in Chart? Yes  Pre-existing out of facility DNR order (yellow form or pink MOST form) Yellow form placed in chart (order not valid for inpatient use)     Chief Complaint  Patient presents with  . Acute Visit    Fall--> edema to (R) knee and ankle    HPI:  Pt is a 84 y.o. female seen today for an acute visit for pain, swelling, slight warmth right middle and lower leg above the ankle sustained from the fall 07/02/17 when the patient was found on floor in bathroom with legs bent back under her, noted skin tear lateral left lower leg, well approximated with steri-strips closures, no sign of infection. She denied dizziness, chest pain, palpitation, or focal weakness associated with falling.  X-ray right knee and ankle 07/02/17 showed no definite acute fracture or dislocation. Her right mid  lower leg pain worsened with weight bearing, two person transfer required, current location of care is [facility] which is not sufficient for her ADL needs. HPI was provided with assistance of staff due to her dementia.    Past Medical History:  Diagnosis Date  . Abnormality of gait 04/28/2010   History of fall onto right shoulder-has participated in PT. Uses walker.     . Acute encephalopathy 10/25/14   Occurred when septic  . Acute lower GI bleeding 10/30/2014  . Adjustment disorder with mixed anxiety and depressed mood 06/04/2015   11/15/15 Hgb 12.8, Na 139, K 4.2, Bun 14, creat 0.68, TP 5.7, albumin 3.5, TSH 1.54, Hgb a1c 4.4   . Anemia   . Aortic atherosclerosis ([place]) 10/27/2014  . Asthma   . Cerebral atrophy 10/27/2014  . Cerebrovascular disease 10/27/2014   Small vessel disease  . Cervical disc disease   . Chronic lower back pain 06/04/2015   lumbar spondylosis. Regular f/u Dr. French Ana. Receives injections last 01/2013.  05/27/15 Xray left hip/pelvis: spondylotic changes, severe osteoarthritis Continue Tylenol and Tramadol prn for pain, observe the patient, adding Cymbalta may help. Mid to lower back pain more on the right side back, may consider X-ray of thoracic spine. 06/01/15 CT thoracic spine showed no acute changes.  Patient received 10 injection in the back through Dr. Alvester Morin office. She says the previous problems of discomfort in the back and down the leg have completely resolved. Takes Aleve 220mg  bid   . Debility 06/20/2015   11/15/15 Hgb 12.8, Na 139, K 4.2, Bun 14,  creat 0.68, TP 5.7, albumin 3.5, TSH 1.54, Hgb a1c 4.4   . Degenerative disc disease, lumbar 10/27/2014  . Depression   . Dermatitis 03/25/2016  . Diarrhea 10/25/14  . Diverticulosis 10/27/2014  . Essential hypertension 09/20/2006   11/15/15 Hgb 12.8, Na 139, K 4.2, Bun 14, creat 0.68, TP 5.7, albumin 3.5, TSH 1.54, Hgb a1c 4.4   . Fall at nursing home 10/25/2014  . GERD (gastroesophageal reflux disease)   . History of  cardiovascular disorder 09/20/2006   X 3 in 2000 now on aspirin.    Marland Kitchen Hyperlipidemia 09/20/2006   05/15/14 LDL 95   . Hypertension   . Lumbosacral spondylosis 10/28/2009  . OSTEOPOROSIS 09/20/2006   Noted 2008    . Overactive bladder 11/29/2013   Myrbetriq prescribed by urology.    . Protein calorie malnutrition ([place])    06/06/15 TP 6.0 albumin 2.9  . SCOLIOSIS 10/28/2009  . Scoliosis 10/27/2014  . Thrombocytosis ([place]) 05/31/2015   06/10/15 plt 602 07/23/15 plt 335 11/15/15 Hgb 12.8, Na 139, K 4.2, Bun 14, creat 0.68, TP 5.7, albumin 3.5, TSH 1.54, Hgb a1c 4.4  . TIA (transient ischemic attack) 09/20/2006   Past Surgical History:  Procedure Laterality Date  . ABDOMINAL HYSTERECTOMY    . APPENDECTOMY    . CHOLECYSTECTOMY    . TONSILLECTOMY      Allergies  Allergen Reactions  . Tramadol Other (See Comments)    Hallucinations, agitation/combativeness  . Amoxicillin     Per [facility]  . Oxycodone Other (See Comments)    Per MAR   . Sulfamethoxazole     Per MAR   . Penicillins Rash    Allergies as of 07/05/2017      Reactions   Tramadol Other (See Comments)   Hallucinations, agitation/combativeness   Amoxicillin    Per MAR   Oxycodone Other (See Comments)   Per MAR    Sulfamethoxazole    Per MAR    Penicillins Rash      Medication List        Accurate as of 07/05/17 11:59 PM. Always use your most recent med list.          acetaminophen 325 MG tablet Commonly known as:  TYLENOL Take 650 mg by mouth every 4 (four) hours as needed for moderate pain or headache.   albuterol 108 (90 Base) MCG/ACT inhaler Commonly known as:  PROVENTIL HFA;VENTOLIN HFA Inhale 2 puffs into the lungs every 6 (six) hours as needed for wheezing or shortness of breath.   alendronate 70 MG tablet Commonly known as:  FOSAMAX Take 70 mg by mouth once a week. Take with a full glass of water on an empty stomach.   alum & mag hydroxide-simeth 200-200-20 MG/5ML suspension Commonly known as:   MAALOX/MYLANTA Take 30 mLs by mouth every 4 (four) hours as needed for indigestion or heartburn.   amLODipine 5 MG tablet Commonly known as:  NORVASC Take 5 mg by mouth daily with breakfast.   calcium carbonate 1500 (600 Ca) MG Tabs tablet Commonly known as:  OSCAL Take 600 mg of elemental calcium by mouth 2 (two) times daily with a meal.   cetaphil cream Apply 1 application topically daily.   clobetasol cream 0.05 % Commonly known as:  TEMOVATE Apply 1 application topically 2 (two) times daily as needed.   donepezil 5 MG tablet Commonly known as:  ARICEPT Take 5 mg by mouth at bedtime.   famotidine 20 MG tablet Commonly known as:  PEPCID  Take 20 mg by mouth at bedtime.   loperamide 2 MG capsule Commonly known as:  IMODIUM Take 4 mg by mouth as needed for diarrhea or loose stools. Take one to two tablets by mouth as needed for diarrhea, Max 16 mg/24 hours call MD if Diarrhea continues   losartan 100 MG tablet Commonly known as:  COZAAR Take 100 mg by mouth daily with breakfast.   mirtazapine 7.5 MG tablet Commonly known as:  REMERON Take 7.5 mg by mouth at bedtime.   MYRBETRIQ 25 MG Tb24 tablet Generic drug:  mirabegron ER Take 25 mg by mouth daily with breakfast. Prescribed by urology   nitroGLYCERIN 0.4 MG SL tablet Commonly known as:  NITROSTAT Place 0.4 mg under the tongue every 5 (five) minutes as needed for chest pain. After taking three times and no relief call MD.   polyethylene glycol packet Commonly known as:  MIRALAX / GLYCOLAX Take 17 g by mouth daily as needed for moderate constipation.   pyridOXINE 100 MG tablet Commonly known as:  VITAMIN B-6 Take 100 mg by mouth daily with breakfast.      ROS was provided with assistance of staff Review of Systems  Constitutional: Positive for activity change. Negative for appetite change, chills, diaphoresis, fatigue and fever.  HENT: Positive for hearing loss. Negative for congestion, trouble swallowing and  voice change.   Eyes: Negative for visual disturbance.  Respiratory: Positive for cough. Negative for chest tightness, shortness of breath and wheezing.   Cardiovascular: Positive for leg swelling. Negative for chest pain and palpitations.  Gastrointestinal: Negative for constipation, diarrhea, nausea and vomiting.  Genitourinary: Negative for difficulty urinating, dysuria and urgency.  Musculoskeletal: Positive for gait problem. Negative for joint swelling.  Skin: Positive for wound.  Neurological: Negative for dizziness, speech difficulty, weakness and headaches.       Dementia  Psychiatric/Behavioral: Positive for confusion. Negative for agitation, behavioral problems and sleep disturbance. The patient is not nervous/anxious.     Immunization History  Administered Date(s) Administered  . HiB (PRP-OMP) 02/12/2004  . Influenza Split 01/11/2012  . Influenza Whole 01/07/2009  . Influenza-Unspecified 01/11/2014, 01/01/2015, 01/29/2016, 02/01/2017  . PPD Test 11/14/2014, 06/03/2015, 06/17/2015  . Pneumococcal Conjugate-13 02/08/2017  . Pneumococcal Polysaccharide-23 04/28/2010  . Tdap 08/24/2011  . Zoster 04/13/2010   Pertinent  Health Maintenance Due  Topic Date Due  . INFLUENZA VACCINE  Completed  . DEXA SCAN  Completed  . PNA vac Low Risk Adult  Completed   Fall Risk  12/29/2016 11/02/2016 06/20/2015 05/23/2015 11/09/2014  Falls in the past year? No Yes No No No  Comment - [facility]i Telephone Survey: data to providers prior to load - - -  Number falls in past yr: - 2 or more - - -  Comment - Emmi Telephone Survey Actual Response = 90 - - -  Injury with Fall? - Yes - - -   Functional Status Survey:    Vitals:   07/05/17 1110  BP: 138/70  Pulse: 76  Resp: 18  Temp: 98.7 F (37.1 C)  SpO2: 96%  Weight: 155 lb 9.6 oz (70.6 kg)  Height: 5' 5.5" (1.664 m)   Body mass index is 25.5 kg/m. Physical Exam  Constitutional: She appears well-developed and well-nourished. No distress.   HENT:  Head: Normocephalic and atraumatic.  Eyes: Pupils are equal, round, and reactive to light. Conjunctivae and EOM are normal.  Neck: Normal range of motion. Neck supple. No JVD present.  Cardiovascular: Normal rate and regular  rhythm.  No murmur heard. Pulmonary/Chest: She has no wheezes. She has no rales.  Decreased air entry  Abdominal: Soft. Bowel sounds are normal. She exhibits no distension. There is no tenderness.  Musculoskeletal: She exhibits edema and tenderness.  Right middle lower leg pain, swelling, and mild warmth when palpated, two person transfers now since right leg pain worsened with weight bearing. Edema 1+RLE, trace LLE  Neurological: She is alert. She exhibits normal muscle tone. Coordination normal.  Oriented to person and place.   Skin: Skin is warm and dry. She is not diaphoretic. There is erythema.  Mid lateral left lower leg skin tear, approximated with steri strips, no sign of infection. Anterior right ght mid to lower lower leg swelling, tenderness, and mild warmth.   Psychiatric: She has a normal mood and affect. Her behavior is normal. Thought content normal.    Labs reviewed: Recent Labs    10/08/16 03/09/17 06/24/17  NA 141 141 139  K 4.3 4.2 4.2  BUN 13 14 16   CREATININE 1.0 0.8 0.8   Recent Labs    09/01/16 10/08/16 03/09/17 06/24/17  AST 17 23 25 25   ALT 16 22 23   --   ALKPHOS 66 57 61 47   Recent Labs    09/01/16 10/08/16 03/09/17  WBC 5.3 7.0 8.6  HGB 12.8 13.9 15.0  HCT 37 39 43  PLT 378 375 392   Lab Results  Component Value Date   TSH 1.90 06/24/2017   Lab Results  Component Value Date   HGBA1C 4.4 11/14/2015   Lab Results  Component Value Date   CHOL 176 05/15/2014   HDL 62 05/15/2014   LDLCALC 95 05/15/2014   LDLDIRECT 114.3 12/08/2007   TRIG 93 05/15/2014   CHOLHDL 2.9 CALC 12/08/2007    Significant Diagnostic Results in last 30 days:  No results found.  Assessment/Plan: Pain and swelling of right lower  leg pain, swelling, slight warmth right middle and lower leg above the ankle sustained from the fall 07/02/17 when the patient was found on floor in bathroom with legs bent back under her, noted skin tear lateral left lower leg, well approximated with steri-strips closures, no sign of infection. She denied dizziness, chest pain, palpitation, or focal weakness associated with falling.  X-ray right knee and ankle 07/02/17 showed no definite acute fracture or dislocation. Her right mid lower leg pain worsened with weight bearing, two person transfer required, current location of care is [facility] which is not sufficient for her ADL needs.  Will schedule Tylenol 500mg  po tid x 3 weeks, obtain X-ray of right tibia/fibula, infirmary stay at [facility] due to needs of higher level/assistance with ADLs.   Fall fall 07/02/17 when the patient was found on floor in bathroom with legs bent back under her, noted skin tear lateral left lower leg, well approximated with steri-strips closures, no sign of infection. She denied dizziness, chest pain, palpitation, or focal weakness associated with falling. Unsteady gait and limited safety awareness are contributory to falling, close supervision for safety.   Mild dementia She has limited safety awareness, close supervision for safety, continue Donepezil 5mg  daily for memory.   Osteoporosis without current pathological fracture Currently treated with Fosamax, Vit D, Ca, risk for fractures.   Vitamin D deficiency 06/25/17 Na 139, K 4.2, Bun 16, creat 0.75, TSH 1.9, Vit B12 260, Vit D 23 06/29/17 adding Vit D 50,000 unit po weekly x 12 weeks, then repeat Vit D level.  Family/ staff Communication: plan of care reviewed with the patient and charge nurse.   Labs/tests ordered: X-ray tibia/fibula ap, lateral, and oblique views  Time spend 25 minutes.

## 2017-07-05 NOTE — Assessment & Plan Note (Signed)
pain, swelling, slight warmth right middle and lower leg above the ankle sustained from the fall 07/02/17 when the patient was found on floor in bathroom with legs bent back under her, noted skin tear lateral left lower leg, well approximated with steri-strips closures, no sign of infection. She denied dizziness, chest pain, palpitation, or focal weakness associated with falling.  X-ray right knee and ankle 07/02/17 showed no definite acute fracture or dislocation. Her right mid lower leg pain worsened with weight bearing, two person transfer required, current location of care is [facility] which is not sufficient for her ADL needs.  Will schedule Tylenol 500mg  po tid x 3 weeks, obtain X-ray of right tibia/fibula, infirmary stay at [facility] due to needs of higher level/assistance with ADLs.

## 2017-07-05 NOTE — Assessment & Plan Note (Signed)
06/25/17 Na 139, K 4.2, Bun 16, creat 0.75, TSH 1.9, Vit B12 260, Vit D 23 06/29/17 adding Vit D 50,000 unit po weekly x 12 weeks, then repeat Vit D level.

## 2017-07-06 DIAGNOSIS — S82839A Other fracture of upper and lower end of unspecified fibula, initial encounter for closed fracture: Secondary | ICD-10-CM | POA: Insufficient documentation

## 2017-07-08 DIAGNOSIS — M25561 Pain in right knee: Secondary | ICD-10-CM | POA: Diagnosis not present

## 2017-07-09 ENCOUNTER — Non-Acute Institutional Stay (SKILLED_NURSING_FACILITY): Payer: Medicare Other | Admitting: Internal Medicine

## 2017-07-09 ENCOUNTER — Encounter: Payer: Self-pay | Admitting: Internal Medicine

## 2017-07-09 DIAGNOSIS — F0390 Unspecified dementia without behavioral disturbance: Secondary | ICD-10-CM

## 2017-07-09 DIAGNOSIS — I10 Essential (primary) hypertension: Secondary | ICD-10-CM

## 2017-07-09 DIAGNOSIS — R600 Localized edema: Secondary | ICD-10-CM | POA: Diagnosis not present

## 2017-07-09 DIAGNOSIS — L853 Xerosis cutis: Secondary | ICD-10-CM | POA: Diagnosis not present

## 2017-07-09 DIAGNOSIS — K219 Gastro-esophageal reflux disease without esophagitis: Secondary | ICD-10-CM

## 2017-07-09 DIAGNOSIS — F03A0 Unspecified dementia, mild, without behavioral disturbance, psychotic disturbance, mood disturbance, and anxiety: Secondary | ICD-10-CM

## 2017-07-09 DIAGNOSIS — M810 Age-related osteoporosis without current pathological fracture: Secondary | ICD-10-CM

## 2017-07-09 DIAGNOSIS — R2681 Unsteadiness on feet: Secondary | ICD-10-CM | POA: Diagnosis not present

## 2017-07-09 DIAGNOSIS — S89301D Unspecified physeal fracture of lower end of right fibula, subsequent encounter for fracture with routine healing: Secondary | ICD-10-CM

## 2017-07-09 NOTE — Progress Notes (Signed)
Location:  [facility] Room Number: 3 Place of Service:  SNF [phone]) Provider:  Blanchie Serve MD  Blanchie Serve, MD  Patient Care Team: Blanchie Serve, MD as PCP - General (Internal Medicine) [name], [name], NP as Nurse Practitioner (Nurse Practitioner)  Extended Emergency Contact Information Primary Emergency Contact: [name] Address: [place], [place] [address] [name] of [place] Phone: [phone] Work Phone: [phone] Mobile Phone: [phone] Relation: Daughter Secondary Emergency Contact: [name] Address: [address] [place] [address] [name] of [place] Phone: [phone] Mobile Phone: [phone] Relation: Son  Code Status:  DNR  Goals of care: Advanced Directive information Advanced Directives 07/09/2017  Does Patient Have a Medical Advance Directive? Yes  Type of Paramedic of [place];Living will  Does patient want to make changes to medical advance directive? No - Patient declined  Copy of [place] in Chart? Yes  Pre-existing out of facility DNR order (yellow form or pink MOST form) Yellow form placed in chart (order not valid for inpatient use)     Chief Complaint  Patient presents with  . New Admit To SNF    New Admission Visit     HPI:  Pt is a 84 y.o. female seen today for admission visit. She resided in assisted living facility prior to this and had a fall. Following the fall, she started having pain and swelling to her right ankle. Xray was obtained showing closed non displaced fracture of distal diaphysis of fibula. She was then transferred and had infirmary stay in SNF for few days due to increased need for assistance with her ADLs. She is now admitted to SNF for same reason. She has been NWB to RLE until now. She has been seen by orthopedic [facility] on 07/08/17 and was provided air cast  to be worn while walking. No instruction provided on weight bearing status. She is seen in her room today. She is pleasantly confused with her dementia. She has osteoporosis and is on weekly fosamax with calcium and vitamin D supplement. She is seen in her room today. She does not appear in acute distress. She complaints of pain to right foot.   Past Medical History:  Diagnosis Date  . Abnormality of gait 04/28/2010   History of fall onto right shoulder-has participated in PT. Uses walker.     . Acute encephalopathy 10/25/14   Occurred when septic  . Acute lower GI bleeding 10/30/2014  . Adjustment disorder with mixed anxiety and depressed mood 06/04/2015   11/15/15 Hgb 12.8, Na 139, K 4.2, Bun 14, creat 0.68, TP 5.7, albumin 3.5, TSH 1.54, Hgb a1c 4.4   . Anemia   . Aortic atherosclerosis ([place]) 10/27/2014  . Asthma   . Cerebral atrophy 10/27/2014  . Cerebrovascular disease 10/27/2014   Small vessel disease  . Cervical disc disease   . Chronic lower back pain 06/04/2015   lumbar spondylosis. Regular f/u Dr. French Ana. Receives injections last 01/2013.  05/27/15 Xray left hip/pelvis: spondylotic changes, severe osteoarthritis Continue Tylenol and Tramadol prn for pain, observe the patient, adding Cymbalta may help. Mid to lower back pain more on the right side back, may consider X-ray of thoracic spine. 06/01/15 CT thoracic spine showed no acute changes.  Patient received 10 injection in the back through Dr. Alvester Morin office. She says the  previous problems of discomfort in the back and down the leg have completely resolved. Takes Aleve 220mg  bid   . Debility 06/20/2015   11/15/15 Hgb 12.8, Na 139, K 4.2, Bun 14, creat 0.68, TP 5.7, albumin 3.5, TSH 1.54, Hgb a1c 4.4   . Degenerative disc disease, lumbar 10/27/2014  . Depression   . Dermatitis 03/25/2016  . Diarrhea 10/25/14  . Diverticulosis 10/27/2014  . Essential hypertension 09/20/2006   11/15/15 Hgb 12.8, Na 139, K 4.2, Bun 14, creat 0.68, TP 5.7,  albumin 3.5, TSH 1.54, Hgb a1c 4.4   . Fall at nursing home 10/25/2014  . GERD (gastroesophageal reflux disease)   . History of cardiovascular disorder 09/20/2006   X 3 in 2000 now on aspirin.    Marland Kitchen Hyperlipidemia 09/20/2006   05/15/14 LDL 95   . Hypertension   . Lumbosacral spondylosis 10/28/2009  . OSTEOPOROSIS 09/20/2006   Noted 2008    . Overactive bladder 11/29/2013   Myrbetriq prescribed by urology.    . Protein calorie malnutrition ([place])    06/06/15 TP 6.0 albumin 2.9  . SCOLIOSIS 10/28/2009  . Scoliosis 10/27/2014  . Thrombocytosis ([place]) 05/31/2015   06/10/15 plt 602 07/23/15 plt 335 11/15/15 Hgb 12.8, Na 139, K 4.2, Bun 14, creat 0.68, TP 5.7, albumin 3.5, TSH 1.54, Hgb a1c 4.4  . TIA (transient ischemic attack) 09/20/2006   Past Surgical History:  Procedure Laterality Date  . ABDOMINAL HYSTERECTOMY    . APPENDECTOMY    . CHOLECYSTECTOMY    . TONSILLECTOMY      Allergies  Allergen Reactions  . Tramadol Other (See Comments)    Hallucinations, agitation/combativeness  . Amoxicillin     Per MAR  . Oxycodone Other (See Comments)    Per MAR   . Sulfamethoxazole     Per MAR   . Penicillins Rash    Outpatient Encounter Medications as of 07/09/2017  Medication Sig  . acetaminophen (TYLENOL) 500 MG tablet Take 500 mg by mouth 3 (three) times daily.  Marland Kitchen albuterol (PROVENTIL HFA;VENTOLIN HFA) 108 (90 BASE) MCG/ACT inhaler Inhale 2 puffs into the lungs every 6 (six) hours as needed for wheezing or shortness of breath.  Marland Kitchen alendronate (FOSAMAX) 70 MG tablet Take 70 mg by mouth once a week. Take with a full glass of water on an empty stomach.  Marland Kitchen alum & mag hydroxide-simeth (MAALOX/MYLANTA) 200-200-20 MG/5ML suspension Take 30 mLs by mouth every 4 (four) hours as needed for indigestion or heartburn.   Marland Kitchen amLODipine (NORVASC) 5 MG tablet Take 5 mg by mouth daily with breakfast.   . calcium carbonate (OSCAL) 1500 (600 Ca) MG TABS tablet Take 600 mg of elemental calcium by mouth 2 (two) times daily  with a meal.  . cetaphil (CETAPHIL) cream Apply 1 application topically daily.  . Cholecalciferol (VITAMIN D3) 50000 units TABS Take 50,000 Units by mouth once a week. Stop date 09/21/17  . clobetasol cream (TEMOVATE) 1.96 % Apply 1 application topically 2 (two) times daily as needed.  . donepezil (ARICEPT) 5 MG tablet Take 5 mg by mouth at bedtime.  . famotidine (PEPCID) 20 MG tablet Take 20 mg by mouth at bedtime.  Marland Kitchen loperamide (IMODIUM) 2 MG capsule Take 2 mg by mouth as needed for diarrhea or loose stools. Max 16 mg/24 hours call MD if Diarrhea continues  . losartan (COZAAR) 100 MG tablet Take 100 mg by mouth daily with breakfast.   . Mirabegron ER (MYRBETRIQ) 25 MG TB24 Take 25 mg by mouth  daily with breakfast. Prescribed by urology  . mirtazapine (REMERON) 7.5 MG tablet Take 7.5 mg by mouth at bedtime.  . nitroGLYCERIN (NITROSTAT) 0.4 MG SL tablet Place 0.4 mg under the tongue every 5 (five) minutes as needed for chest pain. After taking three times and no relief call MD.  . polyethylene glycol (MIRALAX / GLYCOLAX) packet Take 17 g by mouth daily as needed for moderate constipation.   Marland Kitchen pyridOXINE (VITAMIN B-6) 100 MG tablet Take 100 mg by mouth daily with breakfast.   . [DISCONTINUED] acetaminophen (TYLENOL) 325 MG tablet Take 650 mg by mouth every 4 (four) hours as needed for moderate pain or headache.   No facility-administered encounter medications on file as of 07/09/2017.     Review of Systems  Constitutional: Negative for appetite change, chills, diaphoresis and fever.  HENT: Negative for congestion, mouth sores, sore throat and trouble swallowing.   Respiratory: Negative for cough, shortness of breath and wheezing.   Cardiovascular: Negative for chest pain and palpitations.  Gastrointestinal: Negative for abdominal pain, constipation, diarrhea, nausea and vomiting.  Genitourinary: Negative for dysuria.  Musculoskeletal: Positive for gait problem. Negative for back pain.  Skin:  Negative for rash.       Dry skin  Neurological: Negative for light-headedness and headaches.  Psychiatric/Behavioral: Positive for confusion. Negative for behavioral problems. The patient is not nervous/anxious.     Immunization History  Administered Date(s) Administered  . HiB (PRP-OMP) 02/12/2004  . Influenza Split 01/11/2012  . Influenza Whole 01/07/2009  . Influenza-Unspecified 01/11/2014, 01/01/2015, 01/29/2016, 02/01/2017  . PPD Test 11/14/2014, 06/03/2015, 06/17/2015  . Pneumococcal Conjugate-13 02/08/2017  . Pneumococcal Polysaccharide-23 04/28/2010  . Tdap 08/24/2011  . Zoster 04/13/2010   Pertinent  Health Maintenance Due  Topic Date Due  . INFLUENZA VACCINE  Completed  . DEXA SCAN  Completed  . PNA vac Low Risk Adult  Completed   Fall Risk  12/29/2016 11/02/2016 06/20/2015 05/23/2015 11/09/2014  Falls in the past year? No Yes No No No  Comment - Emmi Telephone Survey: data to providers prior to load - - -  Number falls in past yr: - 2 or more - - -  Comment - Emmi Telephone Survey Actual Response = 90 - - -  Injury with Fall? - Yes - - -   Functional Status Survey:    Vitals:   07/09/17 0957  BP: 140/70  Pulse: 68  Resp: 17  Temp: (!) 97.3 F (36.3 C)  TempSrc: Oral  SpO2: 97%  Weight: 157 lb 11.2 oz (71.5 kg)  Height: 5' 5.5" (1.664 m)   Body mass index is 25.84 kg/m.   Wt Readings from Last 3 Encounters:  07/09/17 157 lb 11.2 oz (71.5 kg)  07/05/17 155 lb 9.6 oz (70.6 kg)  06/22/17 155 lb 9.6 oz (70.6 kg)   Physical Exam  Constitutional: She appears well-developed and well-nourished. No distress.  HENT:  Head: Normocephalic and atraumatic.  Right Ear: External ear normal.  Left Ear: External ear normal.  Nose: Nose normal.  Mouth/Throat: Oropharynx is clear and moist.  Hearing aids to both ears  Eyes: Pupils are equal, round, and reactive to light. Conjunctivae and EOM are normal. Right eye exhibits no discharge. Left eye exhibits no discharge.    Has corrective glasses  Neck: Normal range of motion. Neck supple.  Cardiovascular: Normal rate, regular rhythm and intact distal pulses.  Pulmonary/Chest: Effort normal and breath sounds normal. She has no wheezes. She has no rales.  Abdominal: Soft.  Bowel sounds are normal. There is no tenderness. There is no guarding.  Musculoskeletal: She exhibits edema and tenderness.  Right leg 1+ edema, left leg no edema, right leg cast present, unsteady gait, wheelchair for ambulation at present, arthritis changes to fingers of hands, contracture to right ring finger  Lymphadenopathy:    She has no cervical adenopathy.  Neurological: She is alert. She exhibits normal muscle tone.  Oriented to person and place but not to time  Skin: Skin is warm and dry. No rash noted. She is not diaphoretic.  Dry skin  Psychiatric: She has a normal mood and affect.    Labs reviewed: Recent Labs    10/08/16 03/09/17 06/24/17  NA 141 141 139  K 4.3 4.2 4.2  BUN 13 14 16   CREATININE 1.0 0.8 0.8   Recent Labs    09/01/16 10/08/16 03/09/17 06/24/17  AST 17 23 25 25   ALT 16 22 23   --   ALKPHOS 66 57 61 47   Recent Labs    09/01/16 10/08/16 03/09/17  WBC 5.3 7.0 8.6  HGB 12.8 13.9 15.0  HCT 37 39 43  PLT 378 375 392   Lab Results  Component Value Date   TSH 1.90 06/24/2017   Lab Results  Component Value Date   HGBA1C 4.4 11/14/2015   Lab Results  Component Value Date   CHOL 176 05/15/2014   HDL 62 05/15/2014   LDLCALC 95 05/15/2014   LDLDIRECT 114.3 12/08/2007   TRIG 93 05/15/2014   CHOLHDL 2.9 CALC 12/08/2007    Significant Diagnostic Results in last 30 days:  No results found.  Assessment/Plan  Unsteady gait Post fall with fracture. Will have patient work with PT/OT as tolerated to regain strength and restore function.  Fall precautions are in place.  Right fibula fracture Closed, non displaced fracture to distal diaphysis of right fibula. Seen by orthopedic and recommend wearing  air cast to RLE while walking. Note from OV pending. No clear instruction on weight bearing status. CMA has called the office to get further instructions but no clear order provided. With her being asked to wear cast while walking, will have her to be weight bearing as tolerated for now. Fall precautions. Currently on tylenol 500 mg tid with oscal and vitamin d. Change tylenol to 1000 mg po tid for now.   Right leg edema Likely from recent fracture. Keep legs elevated at rest, apply compression stockings.   Osteoporosis Continue fosamax once a week with calcium and vitamin d supplement.   Hypertension No readings showing hypotension. Continue amlodipine 5 mg daily and losartan 100 mg daily. BP bid for 2 weeks for now.  Dementia No behavior disturbance reported. Continue donepezil 5 mg daily and supportive care  gerd Continue pepcid for now  Dry skin Skin care, cetaphil daily for now.    Family/ staff Communication: reviewed care plan with patient and charge nurse.    Labs/tests ordered:  Cbc with diff, bmp next lab   [facility], MD Internal Medicine [facility] [place],  [address] Cell Phone (Monday-Friday 8 am - 5 pm): [phone] On Call: [phone] and follow prompts after 5 pm and on weekends Office Phone: [phone] Office Fax: [phone]

## 2017-07-13 DIAGNOSIS — I10 Essential (primary) hypertension: Secondary | ICD-10-CM | POA: Diagnosis not present

## 2017-07-13 DIAGNOSIS — R600 Localized edema: Secondary | ICD-10-CM | POA: Diagnosis not present

## 2017-07-13 LAB — BASIC METABOLIC PANEL
BUN: 17 (ref 4–21)
Creatinine: 0.8 (ref <=1.1)
Glucose: 91
Potassium: 3.6 (ref 3.4–5.3)
SODIUM: 141 (ref 137–147)

## 2017-07-13 LAB — CBC AND DIFFERENTIAL
HEMATOCRIT: 36 (ref 36–46)
Hemoglobin: 12.4 (ref 12.0–16.0)
PLATELETS: 385 (ref 150–399)
WBC: 7.4

## 2017-07-14 ENCOUNTER — Encounter: Payer: Self-pay | Admitting: Nurse Practitioner

## 2017-07-14 ENCOUNTER — Non-Acute Institutional Stay (SKILLED_NURSING_FACILITY): Payer: Medicare Other | Admitting: Nurse Practitioner

## 2017-07-14 ENCOUNTER — Other Ambulatory Visit: Payer: Self-pay | Admitting: *Deleted

## 2017-07-14 DIAGNOSIS — S81801A Unspecified open wound, right lower leg, initial encounter: Secondary | ICD-10-CM | POA: Diagnosis not present

## 2017-07-14 DIAGNOSIS — R600 Localized edema: Secondary | ICD-10-CM

## 2017-07-14 DIAGNOSIS — S82821D Torus fracture of lower end of right fibula, subsequent encounter for fracture with routine healing: Secondary | ICD-10-CM | POA: Diagnosis not present

## 2017-07-14 NOTE — Progress Notes (Signed)
Location:  [facility] [place] Room Number: 3 Place of Service:  SNF (31) Provider:  Lakeitha Basques, [place]  NP  Blanchie Serve, MD  Patient Care Team: Blanchie Serve, MD as PCP - General (Internal Medicine) Aarika Moon X, NP as Nurse Practitioner (Nurse Practitioner)  Extended Emergency Contact Information Primary Emergency Contact: [name] Address: [place], [place] [address] [name] of [place] Phone: [phone] Work Phone: [phone] Mobile Phone: [phone] Relation: Daughter Secondary Emergency Contact: [name] Address: [address] [place] [address] [name] of [place] Phone: [phone] Mobile Phone: [phone] Relation: Son  Code Status:  DNR Goals of care: Advanced Directive information Advanced Directives 07/14/2017  Does Patient Have a Medical Advance Directive? Yes  Type of Paramedic of [place];Living will;Out of facility DNR (pink MOST or yellow form)  Does patient want to make changes to medical advance directive? No - Patient declined  Copy of [place] in Chart? Yes  Pre-existing out of facility DNR order (yellow form or pink MOST form) Yellow form placed in chart (order not valid for inpatient use)     Chief Complaint  Patient presents with  . Acute Visit    (RLE) -> Edema, fluid leaking from lower ext.    HPI:  Pt is a 84 y.o. female seen today for an acute visit for right lower leg swelling, fluid leaking out,  mild erythema, pain at the 1/2 lower/above the lateral malleolus. 07/13/17 wbc 7.4, Hgb 12.4, plt 385, neutrophils 66.5%, Na 141, K 3.6, Bun 17, creat 0.75. 07/08/17 Ortho consult: air cast R ankle for ambulation, WBAT for non displaced fibula fracture.    Past Medical History:  Diagnosis Date  . Abnormality of gait 04/28/2010   History of fall onto right shoulder-has participated in PT. Uses walker.     .  Acute encephalopathy 10/25/14   Occurred when septic  . Acute lower GI bleeding 10/30/2014  . Adjustment disorder with mixed anxiety and depressed mood 06/04/2015   11/15/15 Hgb 12.8, Na 139, K 4.2, Bun 14, creat 0.68, TP 5.7, albumin 3.5, TSH 1.54, Hgb a1c 4.4   . Anemia   . Aortic atherosclerosis ([place]) 10/27/2014  . Asthma   . Cerebral atrophy 10/27/2014  . Cerebrovascular disease 10/27/2014   Small vessel disease  . Cervical disc disease   . Chronic lower back pain 06/04/2015   lumbar spondylosis. Regular f/u Dr. French Ana. Receives injections last 01/2013.  05/27/15 Xray left hip/pelvis: spondylotic changes, severe osteoarthritis Continue Tylenol and Tramadol prn for pain, observe the patient, adding Cymbalta may help. Mid to lower back pain more on the right side back, may consider X-ray of thoracic spine. 06/01/15 CT thoracic spine showed no acute changes.  Patient received 10 injection in the back through Dr. Alvester Morin office. She says the previous problems of discomfort in the back and down the leg have completely resolved. Takes Aleve 220mg  bid   . Debility 06/20/2015   11/15/15 Hgb 12.8, Na 139, K 4.2, Bun 14, creat 0.68, TP 5.7, albumin 3.5, TSH 1.54, Hgb a1c 4.4   . Degenerative disc disease, lumbar 10/27/2014  . Depression   . Dermatitis 03/25/2016  . Diarrhea 10/25/14  . Diverticulosis 10/27/2014  . Essential hypertension 09/20/2006   11/15/15 Hgb 12.8, Na 139, K 4.2, Bun 14, creat 0.68, TP 5.7, albumin 3.5, TSH 1.54,  Hgb a1c 4.4   . Fall at nursing home 10/25/2014  . GERD (gastroesophageal reflux disease)   . History of cardiovascular disorder 09/20/2006   X 3 in 2000 now on aspirin.    Marland Kitchen Hyperlipidemia 09/20/2006   05/15/14 LDL 95   . Hypertension   . Lumbosacral spondylosis 10/28/2009  . OSTEOPOROSIS 09/20/2006   Noted 2008    . Overactive bladder 11/29/2013   Myrbetriq prescribed by urology.    . Protein calorie malnutrition ([place])    06/06/15 TP 6.0 albumin 2.9  . SCOLIOSIS 10/28/2009  .  Scoliosis 10/27/2014  . Thrombocytosis ([place]) 05/31/2015   06/10/15 plt 602 07/23/15 plt 335 11/15/15 Hgb 12.8, Na 139, K 4.2, Bun 14, creat 0.68, TP 5.7, albumin 3.5, TSH 1.54, Hgb a1c 4.4  . TIA (transient ischemic attack) 09/20/2006   Past Surgical History:  Procedure Laterality Date  . ABDOMINAL HYSTERECTOMY    . APPENDECTOMY    . CHOLECYSTECTOMY    . TONSILLECTOMY      Allergies  Allergen Reactions  . Tramadol Other (See Comments)    Hallucinations, agitation/combativeness  . Amoxicillin     Per MAR  . Oxycodone Other (See Comments)    Per MAR   . Sulfamethoxazole     Per MAR   . Penicillins Rash    Outpatient Encounter Medications as of 07/14/2017  Medication Sig  . acetaminophen (TYLENOL) 500 MG tablet Take 500 mg by mouth 3 (three) times daily.   Marland Kitchen albuterol (PROVENTIL HFA;VENTOLIN HFA) 108 (90 BASE) MCG/ACT inhaler Inhale 2 puffs into the lungs every 6 (six) hours as needed for wheezing or shortness of breath.  Marland Kitchen alendronate (FOSAMAX) 70 MG tablet Take 70 mg by mouth once a week. Take with a full glass of water on an empty stomach.  Marland Kitchen alum & mag hydroxide-simeth (MAALOX/MYLANTA) 200-200-20 MG/5ML suspension Take 30 mLs by mouth every 4 (four) hours as needed for indigestion or heartburn.   Marland Kitchen amLODipine (NORVASC) 5 MG tablet Take 5 mg by mouth daily with breakfast.   . calcium carbonate (OSCAL) 1500 (600 Ca) MG TABS tablet Take 600 mg of elemental calcium by mouth 2 (two) times daily with a meal.  . cetaphil (CETAPHIL) cream Apply 1 application topically daily.  . Cholecalciferol (VITAMIN D3) 50000 units TABS Take 50,000 Units by mouth once a week. Stop date 09/21/17  . clobetasol cream (TEMOVATE) 3.61 % Apply 1 application topically 2 (two) times daily as needed.  . donepezil (ARICEPT) 5 MG tablet Take 5 mg by mouth at bedtime.  . famotidine (PEPCID) 20 MG tablet Take 20 mg by mouth at bedtime.  Marland Kitchen loperamide (IMODIUM) 2 MG capsule Take 2 mg by mouth as needed for diarrhea or  loose stools. Max 16 mg/24 hours call MD if Diarrhea continues  . losartan (COZAAR) 100 MG tablet Take 100 mg by mouth daily with breakfast.   . Mirabegron ER (MYRBETRIQ) 25 MG TB24 Take 25 mg by mouth daily with breakfast. Prescribed by urology  . mirtazapine (REMERON) 7.5 MG tablet Take 7.5 mg by mouth at bedtime.  . nitroGLYCERIN (NITROSTAT) 0.4 MG SL tablet Place 0.4 mg under the tongue every 5 (five) minutes as needed for chest pain. After taking three times and no relief call MD.  . polyethylene glycol (MIRALAX / GLYCOLAX) packet Take 17 g by mouth daily as needed for moderate constipation.   Marland Kitchen pyridOXINE (VITAMIN B-6) 100 MG tablet Take 100 mg by mouth daily with breakfast.    No  facility-administered encounter medications on file as of 07/14/2017.    ROS was provided with assistance of staff Review of Systems  Constitutional: Negative for activity change, appetite change, chills, diaphoresis, fatigue and fever.  Respiratory: Negative for shortness of breath.   Cardiovascular: Positive for leg swelling. Negative for chest pain and palpitations.  Musculoskeletal: Positive for arthralgias, gait problem and joint swelling.  Skin: Positive for color change and wound. Negative for pallor and rash.  Neurological: Negative for speech difficulty, weakness and headaches.  Psychiatric/Behavioral: Negative for agitation, behavioral problems, confusion, hallucinations and sleep disturbance.    Immunization History  Administered Date(s) Administered  . HiB (PRP-OMP) 02/12/2004  . Influenza Split 01/11/2012  . Influenza Whole 01/07/2009  . Influenza-Unspecified 01/11/2014, 01/01/2015, 01/29/2016, 02/01/2017  . PPD Test 11/14/2014, 06/03/2015, 06/17/2015  . Pneumococcal Conjugate-13 02/08/2017  . Pneumococcal Polysaccharide-23 04/28/2010  . Tdap 08/24/2011  . Zoster 04/13/2010   Pertinent  Health Maintenance Due  Topic Date Due  . INFLUENZA VACCINE  11/11/2017  . DEXA SCAN  Completed  .  PNA vac Low Risk Adult  Completed   Fall Risk  12/29/2016 11/02/2016 06/20/2015 05/23/2015 11/09/2014  Falls in the past year? No Yes No No No  Comment - Emmi Telephone Survey: data to providers prior to load - - -  Number falls in past yr: - 2 or more - - -  Comment - Emmi Telephone Survey Actual Response = 90 - - -  Injury with Fall? - Yes - - -   Functional Status Survey:    Vitals:   07/14/17 1016  BP: 126/70  Pulse: 67  Resp: 20  Temp: 97.6 F (36.4 C)  Weight: 157 lb 11.2 oz (71.5 kg)  Height: 5' 5.5" (1.664 m)   Body mass index is 25.84 kg/m. Physical Exam  Constitutional: She appears well-developed and well-nourished. No distress.  HENT:  Head: Normocephalic and atraumatic.  Neck: No JVD present.  Cardiovascular: Normal rate, regular rhythm and intact distal pulses.  No murmur heard. Dorsalis pedis pulse intact. No cordlike findings in the right lower leg. Negative for homans.   Pulmonary/Chest: She has no wheezes. She has no rales.  Musculoskeletal: She exhibits edema and tenderness.  RLE 1+ edema. Pain in the 1/2 right lower leg/above the lateral right ankle.   Neurological: She is alert. She exhibits normal muscle tone. Coordination normal.  Oriented to person and place.   Skin: Skin is warm and dry. There is erythema.  A small less than 0.5cm in diameter open area noted at the lateral of the mid lower leg on top of air cast region, serous drainage seen, mild erythema in the area as well in the 1/2 lower leg/above the right ankle.   Psychiatric: She has a normal mood and affect. Her behavior is normal.    Labs reviewed: Recent Labs    03/09/17 06/24/17 07/13/17  NA 141 139 141  K 4.2 4.2 3.6  BUN 14 16 17   CREATININE 0.8 0.8 0.8   Recent Labs    09/01/16 10/08/16 03/09/17 06/24/17  AST 17 23 25 25   ALT 16 22 23   --   ALKPHOS 66 57 61 47   Recent Labs    10/08/16 03/09/17 07/13/17  WBC 7.0 8.6 7.4  HGB 13.9 15.0 12.4  HCT 39 43 36  PLT 375 392 385    Lab Results  Component Value Date   TSH 1.90 06/24/2017   Lab Results  Component Value Date   HGBA1C 4.4  11/14/2015   Lab Results  Component Value Date   CHOL 176 05/15/2014   HDL 62 05/15/2014   LDLCALC 95 05/15/2014   LDLDIRECT 114.3 12/08/2007   TRIG 93 05/15/2014   CHOLHDL 2.9 CALC 12/08/2007    Significant Diagnostic Results in last 30 days:  No results found.  Assessment/Plan Leg wound, right, initial encounter right lower leg swelling, fluid leaking out, mild erythema, pain at the 1/2 lower/above the lateral malleolus. 07/13/17 wbc 7.4, Hgb 12.4, plt 385, neutrophils 66.5%, Na 141, K 3.6, Bun 17, creat 0.75. 07/08/17 Ortho consult: air cast R ankle for ambulation, WBAT for non displaced fibula fracture. A small open area from rubbing to top edge of air cast, serous drainage noted, no sigh of infection. Will apply non adhesive dressing bid and wrap with right low leg with protective dressing when skin is in touch of air cast. Observe.    Edema 1+ edema In the right lower leg, non displaced fibula fracture contributory, encourage the patient stay active physically as much as possible since she is at  risk for DVT and cellulitis since the wound and less active. Observe.   Closed fracture of distal fibula Continue air cast in the right ankle with ambulatory, WBAT, f/u Ortho, continue Tylenol 1000mg  po tid.      Family/ staff Communication: plan of care reviewed with the patient and charge nurse.    Labs/tests ordered:  None   Time spend 25 minutes.

## 2017-07-15 DIAGNOSIS — R609 Edema, unspecified: Secondary | ICD-10-CM | POA: Insufficient documentation

## 2017-07-15 DIAGNOSIS — S81801A Unspecified open wound, right lower leg, initial encounter: Secondary | ICD-10-CM | POA: Insufficient documentation

## 2017-07-15 NOTE — Assessment & Plan Note (Signed)
right lower leg swelling, fluid leaking out, mild erythema, pain at the 1/2 lower/above the lateral malleolus. 07/13/17 wbc 7.4, Hgb 12.4, plt 385, neutrophils 66.5%, Na 141, K 3.6, Bun 17, creat 0.75. 07/08/17 Ortho consult: air cast R ankle for ambulation, WBAT for non displaced fibula fracture. A small open area from rubbing to top edge of air cast, serous drainage noted, no sigh of infection. Will apply non adhesive dressing bid and wrap with right low leg with protective dressing when skin is in touch of air cast. Observe.

## 2017-07-15 NOTE — Assessment & Plan Note (Signed)
1+ edema In the right lower leg, non displaced fibula fracture contributory, encourage the patient stay active physically as much as possible since she is at  risk for DVT and cellulitis since the wound and less active. Observe.

## 2017-07-15 NOTE — Assessment & Plan Note (Addendum)
Continue air cast in the right ankle with ambulatory, WBAT, f/u Ortho, continue Tylenol 1000mg  po tid.

## 2017-07-26 ENCOUNTER — Encounter: Payer: Self-pay | Admitting: Nurse Practitioner

## 2017-07-27 NOTE — Progress Notes (Signed)
This encounter was created in error - please disregard.

## 2017-07-30 IMAGING — DX DG FINGER RING 2+V*R*
4 series · 4 of 4 positions shown · non-contrast
Comparison: None.

CLINICAL DATA: Laceration after finger caught in wheelchair

EXAM:
RIGHT FOURTH FINGER 2+V

[finger ap]
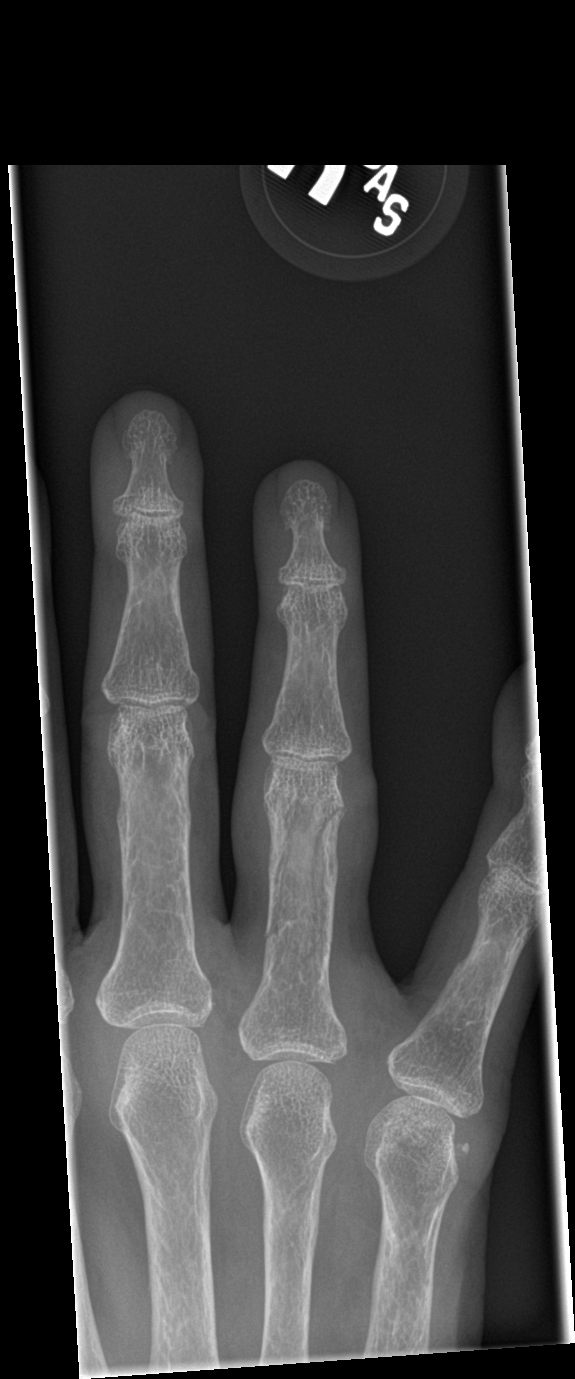

[finger obl]
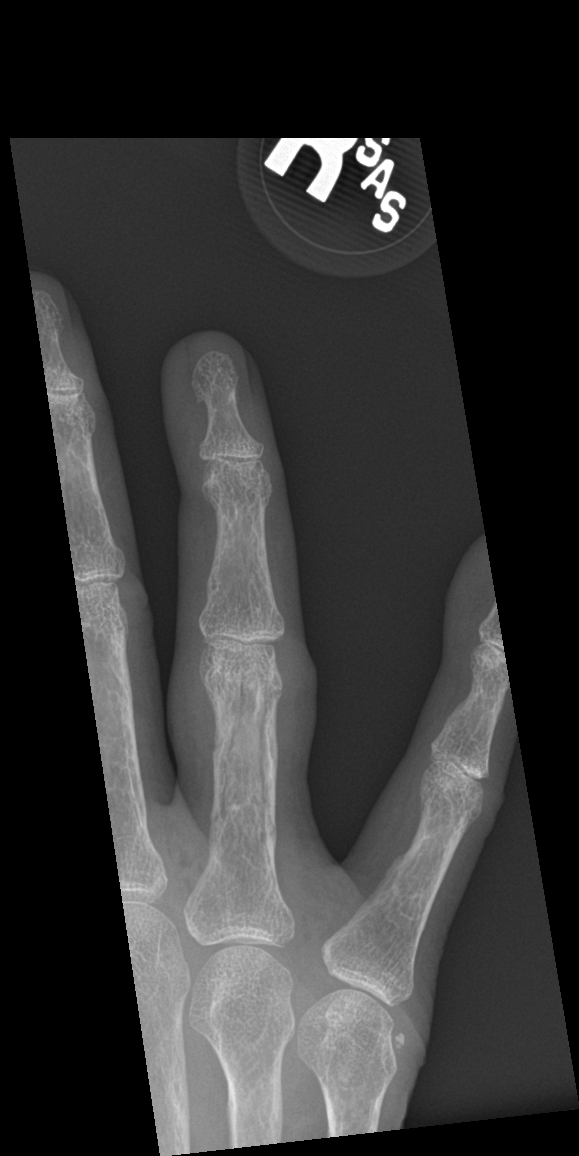

[finger lat (1 of 2)]
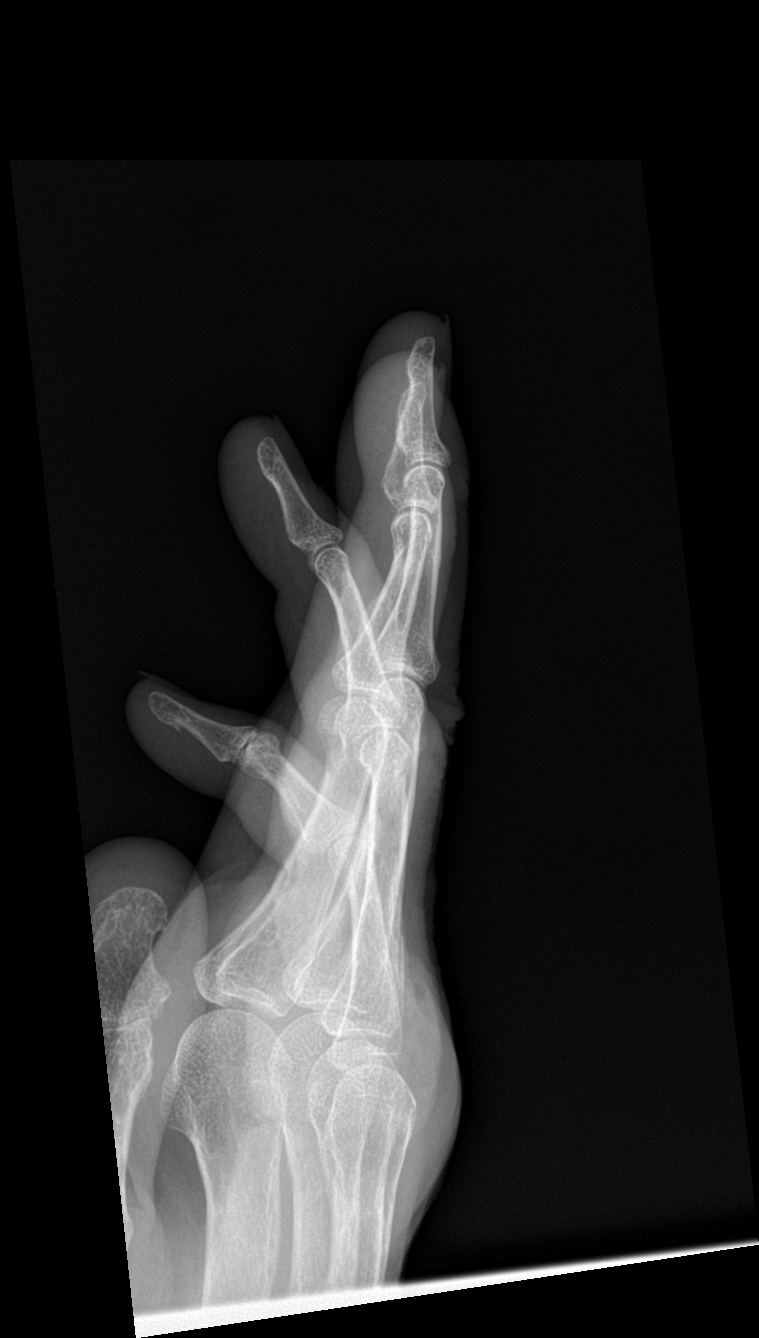

[finger lat (2 of 2)]
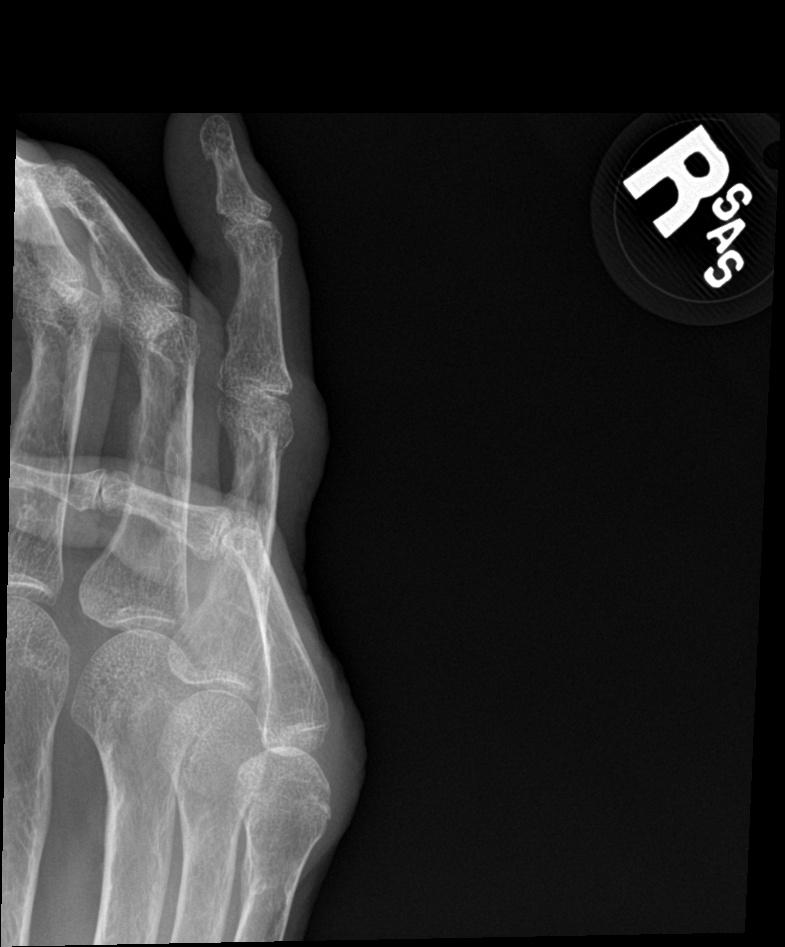

[4 of 4 positions shown; findings below may reference images not displayed]

FINDINGS: Frontal, oblique, and lateral views were obtained. There are
fractures of the mid the distal aspects of the fourth proximal
phalanx with alignment near anatomic at fracture sites. There is
soft tissue swelling in this area. No other fractures. No
dislocations. There is slight narrowing of the fourth PIP and DIP
joints.
IMPRESSION: Fractures of the mid the distal aspects of the fourth proximal
phalanx with soft tissue swelling. Alignment near anatomic. No other
fractures. No dislocation. Mild narrowing of the PIP and DIP joints
of the fourth digit.

## 2017-08-09 ENCOUNTER — Non-Acute Institutional Stay (SKILLED_NURSING_FACILITY): Payer: Medicare Other | Admitting: Nurse Practitioner

## 2017-08-09 ENCOUNTER — Encounter: Payer: Self-pay | Admitting: Nurse Practitioner

## 2017-08-09 DIAGNOSIS — F0390 Unspecified dementia without behavioral disturbance: Secondary | ICD-10-CM | POA: Diagnosis not present

## 2017-08-09 DIAGNOSIS — I10 Essential (primary) hypertension: Secondary | ICD-10-CM | POA: Diagnosis not present

## 2017-08-09 DIAGNOSIS — F03A0 Unspecified dementia, mild, without behavioral disturbance, psychotic disturbance, mood disturbance, and anxiety: Secondary | ICD-10-CM

## 2017-08-09 DIAGNOSIS — N3281 Overactive bladder: Secondary | ICD-10-CM | POA: Diagnosis not present

## 2017-08-09 DIAGNOSIS — F4323 Adjustment disorder with mixed anxiety and depressed mood: Secondary | ICD-10-CM | POA: Diagnosis not present

## 2017-08-09 DIAGNOSIS — S82821S Torus fracture of lower end of right fibula, sequela: Secondary | ICD-10-CM

## 2017-08-09 DIAGNOSIS — M25571 Pain in right ankle and joints of right foot: Secondary | ICD-10-CM | POA: Diagnosis not present

## 2017-08-09 NOTE — Assessment & Plan Note (Signed)
Urinary frequency is managed, continue Myrbetriq 25mg  qd and Keg[place] exercise.  Incontinence episodes.

## 2017-08-09 NOTE — Assessment & Plan Note (Signed)
R ankle fx, saw Ortho today, off brace, R ankle fx is healed.

## 2017-08-09 NOTE — Progress Notes (Signed)
Location:  [place] Room Number: 3 Place of Service:  SNF (31) Provider:  Shabrea Weldin, ManXie  NP  Blanchie Serve, MD  Patient Care Team: Blanchie Serve, MD as PCP - General (Internal Medicine) Payson Evrard X, NP as Nurse Practitioner (Nurse Practitioner)  Extended Emergency Contact Information Primary Emergency Contact: [name] Address: [place], [place] [address] [name] of [place] Phone: [phone] Work Phone: [phone] Mobile Phone: [phone] Relation: Daughter Secondary Emergency Contact: [name] Address: [address] [place] [address] [name] of [place] Phone: [phone] Mobile Phone: [phone] Relation: Son    Code Status:  DNR Goals of care: Advanced Directive information Advanced Directives 07/14/2017  Does Patient Have a Medical Advance Directive? Yes  Type of Paramedic of [place];Living will;Out of facility DNR (pink MOST or yellow form)  Does patient want to make changes to medical advance directive? No - Patient declined  Copy of [place] in Chart? Yes  Pre-existing out of facility DNR order (yellow form or pink MOST form) Yellow form placed in chart (order not valid for inpatient use)     Chief Complaint  Patient presents with  . Medical Management of Chronic Issues    F/U- Leg wound(R), unsteady gait,     HPI:  Pt is a 84 y.o. female seen today for medical management of chronic diseases.     The patient has recent R ankle fx, saw Ortho today, off brace, R ankle fx is healed. The patient resides in SNF [facility], one person transfer, self propels w/c to get around. 08/07/17 the patient smacked another resides when she was trying to wipe off something from Mrs Wrubel face. The patient denied depression, anxiety, she stated she sleeps and eats well. She takes Mirtazapine 7.5mg  qhs for depression/sleep. Hx of  dementia, on Donepezil 5mg  daily for memtory. Her blood pressure is controlled on Losartan 100mg  qd. Urinary frequency is managed on Myrbetriq 25mg  qd and Kegel exercise.   Past Medical History:  Diagnosis Date  . Abnormality of gait 04/28/2010   History of fall onto right shoulder-has participated in PT. Uses walker.     . Acute encephalopathy 10/25/14   Occurred when septic  . Acute lower GI bleeding 10/30/2014  . Adjustment disorder with mixed anxiety and depressed mood 06/04/2015   11/15/15 Hgb 12.8, Na 139, K 4.2, Bun 14, creat 0.68, TP 5.7, albumin 3.5, TSH 1.54, Hgb a1c 4.4   . Anemia   . Aortic atherosclerosis ([place]) 10/27/2014  . Asthma   . Cerebral atrophy 10/27/2014  . Cerebrovascular disease 10/27/2014   Small vessel disease  . Cervical disc disease   . Chronic lower back pain 06/04/2015   lumbar spondylosis. Regular f/u Dr. French Ana. Receives injections last 01/2013.  05/27/15 Xray left hip/pelvis: spondylotic changes, severe osteoarthritis Continue Tylenol and Tramadol prn for pain, observe the patient, adding Cymbalta may help. Mid to lower back pain more on the right side back, may consider X-ray of thoracic spine. 06/01/15 CT thoracic spine showed no acute changes.  Patient received 10 injection in the back through Dr. Alvester Morin office. She says the previous problems of discomfort in the back and down the leg have completely resolved. Takes Aleve 220mg  bid   . Debility 06/20/2015   11/15/15 Hgb 12.8, Na 139, K 4.2, Bun 14, creat 0.68, TP 5.7,  albumin 3.5, TSH 1.54, Hgb a1c 4.4   . Degenerative disc disease, lumbar 10/27/2014  . Depression   . Dermatitis 03/25/2016  . Diarrhea 10/25/14  . Diverticulosis 10/27/2014  . Essential hypertension 09/20/2006   11/15/15 Hgb 12.8, Na 139, K 4.2, Bun 14, creat 0.68, TP 5.7, albumin 3.5, TSH 1.54, Hgb a1c 4.4   . Fall at nursing home 10/25/2014  . GERD (gastroesophageal reflux disease)   . History of cardiovascular disorder 09/20/2006   X 3 in 2000 now  on aspirin.    Marland Kitchen Hyperlipidemia 09/20/2006   05/15/14 LDL 95   . Hypertension   . Lumbosacral spondylosis 10/28/2009  . OSTEOPOROSIS 09/20/2006   Noted 2008    . Overactive bladder 11/29/2013   Myrbetriq prescribed by urology.    . Protein calorie malnutrition ([place])    06/06/15 TP 6.0 albumin 2.9  . SCOLIOSIS 10/28/2009  . Scoliosis 10/27/2014  . Thrombocytosis ([place]) 05/31/2015   06/10/15 plt 602 07/23/15 plt 335 11/15/15 Hgb 12.8, Na 139, K 4.2, Bun 14, creat 0.68, TP 5.7, albumin 3.5, TSH 1.54, Hgb a1c 4.4  . TIA (transient ischemic attack) 09/20/2006   Past Surgical History:  Procedure Laterality Date  . ABDOMINAL HYSTERECTOMY    . APPENDECTOMY    . CHOLECYSTECTOMY    . TONSILLECTOMY      Allergies  Allergen Reactions  . Tramadol Other (See Comments)    Hallucinations, agitation/combativeness  . Amoxicillin     Per MAR  . Oxycodone Other (See Comments)    Per MAR   . Sulfamethoxazole     Per MAR   . Penicillins Rash    Outpatient Encounter Medications as of 08/09/2017  Medication Sig  . albuterol (PROVENTIL HFA;VENTOLIN HFA) 108 (90 BASE) MCG/ACT inhaler Inhale 2 puffs into the lungs every 6 (six) hours as needed for wheezing or shortness of breath.  Marland Kitchen alendronate (FOSAMAX) 70 MG tablet Take 70 mg by mouth once a week. Take with a full glass of water on an empty stomach.  Marland Kitchen alum & mag hydroxide-simeth (MAALOX/MYLANTA) 200-200-20 MG/5ML suspension Take 30 mLs by mouth every 4 (four) hours as needed for indigestion or heartburn.   Marland Kitchen amLODipine (NORVASC) 5 MG tablet Take 5 mg by mouth daily with breakfast.   . calcium carbonate (OSCAL) 1500 (600 Ca) MG TABS tablet Take 600 mg of elemental calcium by mouth 2 (two) times daily with a meal.  . cetaphil (CETAPHIL) cream Apply 1 application topically daily.  . Cholecalciferol (VITAMIN D3) 50000 units TABS Take 50,000 Units by mouth once a week. Stop date 09/21/17  . clobetasol cream (TEMOVATE) 1.61 % Apply 1 application topically 2 (two) times  daily as needed.  . donepezil (ARICEPT) 5 MG tablet Take 5 mg by mouth at bedtime.  . famotidine (PEPCID) 20 MG tablet Take 20 mg by mouth at bedtime.  Marland Kitchen loperamide (IMODIUM) 2 MG capsule Take 2 mg by mouth as needed for diarrhea or loose stools. Max 16 mg/24 hours call MD if Diarrhea continues  . losartan (COZAAR) 100 MG tablet Take 100 mg by mouth daily with breakfast.   . Mirabegron ER (MYRBETRIQ) 25 MG TB24 Take 25 mg by mouth daily with breakfast. Prescribed by urology  . mirtazapine (REMERON) 7.5 MG tablet Take 7.5 mg by mouth at bedtime.  . nitroGLYCERIN (NITROSTAT) 0.4 MG SL tablet Place 0.4 mg under the tongue every 5 (five) minutes as needed for chest pain. After taking three times and no relief call MD.  .  polyethylene glycol (MIRALAX / GLYCOLAX) packet Take 17 g by mouth daily as needed for moderate constipation.   Marland Kitchen pyridOXINE (VITAMIN B-6) 100 MG tablet Take 100 mg by mouth daily with breakfast.    No facility-administered encounter medications on file as of 08/09/2017.    ROS was provided with assistance of staff Review of Systems  Constitutional: Negative for activity change, appetite change, chills, diaphoresis, fatigue and fever.  HENT: Positive for hearing loss. Negative for congestion, trouble swallowing and voice change.   Respiratory: Negative for cough, shortness of breath and wheezing.   Cardiovascular: Positive for leg swelling. Negative for chest pain and palpitations.  Gastrointestinal: Negative for abdominal distention, abdominal pain, constipation, diarrhea, nausea and vomiting.  Genitourinary: Negative for difficulty urinating, dysuria and urgency.       Episodes of urinary incontinence.   Musculoskeletal: Positive for arthralgias and gait problem.  Skin: Negative for color change and pallor.  Neurological: Negative for dizziness, speech difficulty, weakness and headaches.       Dementia.   Psychiatric/Behavioral: Positive for behavioral problems and confusion.  Negative for agitation, hallucinations and sleep disturbance. The patient is not nervous/anxious.     Immunization History  Administered Date(s) Administered  . HiB (PRP-OMP) 02/12/2004  . Influenza Split 01/11/2012  . Influenza Whole 01/07/2009  . Influenza-Unspecified 01/11/2014, 01/01/2015, 01/29/2016, 02/01/2017  . PPD Test 11/14/2014, 06/03/2015, 06/17/2015  . Pneumococcal Conjugate-13 02/08/2017  . Pneumococcal Polysaccharide-23 04/28/2010  . Tdap 08/24/2011  . Zoster 04/13/2010   Pertinent  Health Maintenance Due  Topic Date Due  . INFLUENZA VACCINE  11/11/2017  . DEXA SCAN  Completed  . PNA vac Low Risk Adult  Completed   Fall Risk  12/29/2016 11/02/2016 06/20/2015 05/23/2015 11/09/2014  Falls in the past year? No Yes No No No  Comment - Emmi Telephone Survey: data to providers prior to load - - -  Number falls in past yr: - 2 or more - - -  Comment - Emmi Telephone Survey Actual Response = 90 - - -  Injury with Fall? - Yes - - -   Functional Status Survey:    Vitals:   08/09/17 1327  BP: 128/64  Pulse: 70  Resp: 17  Temp: 97.6 F (36.4 C)  SpO2: 95%  Weight: 163 lb 3.2 oz (74 kg)  Height: 5' 5.5" (1.664 m)   Body mass index is 26.74 kg/m. Physical Exam  Constitutional: She appears well-developed and well-nourished. No distress.  HENT:  Head: Normocephalic and atraumatic.  Eyes: Pupils are equal, round, and reactive to light. EOM are normal.  Neck: Normal range of motion. Neck supple. No JVD present. No thyromegaly present.  Cardiovascular: Normal rate and regular rhythm.  No murmur heard. Pulmonary/Chest: Effort normal. She has no wheezes. She has no rales.  Decreased air entry  Abdominal: Soft. Bowel sounds are normal. She exhibits no distension. There is no tenderness.  Musculoskeletal: She exhibits edema.  1+ edema RLE, trace edema LLE. One person transfer, w/c to get around.   Neurological: She is alert. No cranial nerve deficit. She exhibits normal  muscle tone. Coordination normal.  Oriented to person and place.   Skin: Skin is warm and dry. She is not diaphoretic.  Psychiatric: She has a normal mood and affect. Her behavior is normal.    Labs reviewed: Recent Labs    03/09/17 06/24/17 07/13/17  NA 141 139 141  K 4.2 4.2 3.6  BUN 14 16 17   CREATININE 0.8 0.8 0.8  Recent Labs    09/01/16 10/08/16 03/09/17 06/24/17  AST 17 23 25 25   ALT 16 22 23   --   ALKPHOS 66 57 61 47   Recent Labs    10/08/16 03/09/17 07/13/17  WBC 7.0 8.6 7.4  HGB 13.9 15.0 12.4  HCT 39 43 36  PLT 375 392 385   Lab Results  Component Value Date   TSH 1.90 06/24/2017   Lab Results  Component Value Date   HGBA1C 4.4 11/14/2015   Lab Results  Component Value Date   CHOL 176 05/15/2014   HDL 62 05/15/2014   LDLCALC 95 05/15/2014   LDLDIRECT 114.3 12/08/2007   TRIG 93 05/15/2014   CHOLHDL 2.9 CALC 12/08/2007    Significant Diagnostic Results in last 30 days:  No results found.  Assessment/Plan Closed fracture of distal fibula R ankle fx, saw Ortho today, off brace, R ankle fx is healed.   Adjustment disorder with mixed anxiety and depressed mood The patient resides in SNF [facility], one person transfer, self propels w/c to get around. 08/07/17 the patient smacked another resides when she was trying to wipe off something from Mrs Broxton face. The patient denied depression, anxiety, she stated she sleeps and eats well. Continue  Mirtazapine 7.5mg  qhs for depression/sleep. Observe   Essential hypertension Her blood pressure is controlled, continue Losartan 100mg  qd.   Mild dementia Continue SNF [facility] for care assistance, continue Donepezil   Overactive bladder Urinary frequency is managed, continue Myrbetriq 25mg  qd and Kegel exercise.  Incontinence episodes.       Family/ staff Communication: plan of care reviewed with the patient and charge nurse.   Labs/tests ordered: none  Time spend 25 minutes.

## 2017-08-09 NOTE — Assessment & Plan Note (Signed)
The patient resides in SNF [facility], one person transfer, self propels w/c to get around. 08/07/17 the patient smacked another resides when she was trying to wipe off something from Mrs Fausto face. The patient denied depression, anxiety, she stated she sleeps and eats well. Continue  Mirtazapine 7.5mg  qhs for depression/sleep. Observe

## 2017-08-09 NOTE — Assessment & Plan Note (Signed)
Continue SNF [facility] for care assistance, continue Donepezil

## 2017-08-09 NOTE — Assessment & Plan Note (Signed)
Her blood pressure is controlled, continue Losartan 100mg  qd.

## 2017-08-12 DIAGNOSIS — E162 Hypoglycemia, unspecified: Secondary | ICD-10-CM | POA: Diagnosis not present

## 2017-08-12 DIAGNOSIS — M818 Other osteoporosis without current pathological fracture: Secondary | ICD-10-CM | POA: Diagnosis not present

## 2017-08-12 DIAGNOSIS — M6281 Muscle weakness (generalized): Secondary | ICD-10-CM | POA: Diagnosis not present

## 2017-08-12 DIAGNOSIS — K2971 Gastritis, unspecified, with bleeding: Secondary | ICD-10-CM | POA: Diagnosis not present

## 2017-08-12 DIAGNOSIS — Z9181 History of falling: Secondary | ICD-10-CM | POA: Diagnosis not present

## 2017-08-12 DIAGNOSIS — G459 Transient cerebral ischemic attack, unspecified: Secondary | ICD-10-CM | POA: Diagnosis not present

## 2017-08-12 DIAGNOSIS — R278 Other lack of coordination: Secondary | ICD-10-CM | POA: Diagnosis not present

## 2017-08-12 DIAGNOSIS — H81399 Other peripheral vertigo, unspecified ear: Secondary | ICD-10-CM | POA: Diagnosis not present

## 2017-08-12 DIAGNOSIS — R2681 Unsteadiness on feet: Secondary | ICD-10-CM | POA: Diagnosis not present

## 2017-08-12 DIAGNOSIS — K219 Gastro-esophageal reflux disease without esophagitis: Secondary | ICD-10-CM | POA: Diagnosis not present

## 2017-08-12 DIAGNOSIS — R600 Localized edema: Secondary | ICD-10-CM | POA: Diagnosis not present

## 2017-08-12 DIAGNOSIS — R262 Difficulty in walking, not elsewhere classified: Secondary | ICD-10-CM | POA: Diagnosis not present

## 2017-08-12 DIAGNOSIS — M47897 Other spondylosis, lumbosacral region: Secondary | ICD-10-CM | POA: Diagnosis not present

## 2017-08-12 DIAGNOSIS — R41841 Cognitive communication deficit: Secondary | ICD-10-CM | POA: Diagnosis not present

## 2017-08-12 DIAGNOSIS — M5090 Cervical disc disorder, unspecified, unspecified cervical region: Secondary | ICD-10-CM | POA: Diagnosis not present

## 2017-08-12 DIAGNOSIS — S81801A Unspecified open wound, right lower leg, initial encounter: Secondary | ICD-10-CM | POA: Diagnosis not present

## 2017-08-12 DIAGNOSIS — Z7389 Other problems related to life management difficulty: Secondary | ICD-10-CM | POA: Diagnosis not present

## 2017-08-12 DIAGNOSIS — M25529 Pain in unspecified elbow: Secondary | ICD-10-CM | POA: Diagnosis not present

## 2017-08-12 DIAGNOSIS — R1312 Dysphagia, oropharyngeal phase: Secondary | ICD-10-CM | POA: Diagnosis not present

## 2017-08-12 DIAGNOSIS — I10 Essential (primary) hypertension: Secondary | ICD-10-CM | POA: Diagnosis not present

## 2017-08-12 DIAGNOSIS — L853 Xerosis cutis: Secondary | ICD-10-CM | POA: Diagnosis not present

## 2017-08-13 DIAGNOSIS — L853 Xerosis cutis: Secondary | ICD-10-CM | POA: Diagnosis not present

## 2017-08-13 DIAGNOSIS — R600 Localized edema: Secondary | ICD-10-CM | POA: Diagnosis not present

## 2017-08-13 DIAGNOSIS — Z7389 Other problems related to life management difficulty: Secondary | ICD-10-CM | POA: Diagnosis not present

## 2017-08-13 DIAGNOSIS — R278 Other lack of coordination: Secondary | ICD-10-CM | POA: Diagnosis not present

## 2017-08-13 DIAGNOSIS — M6281 Muscle weakness (generalized): Secondary | ICD-10-CM | POA: Diagnosis not present

## 2017-08-13 DIAGNOSIS — S81801A Unspecified open wound, right lower leg, initial encounter: Secondary | ICD-10-CM | POA: Diagnosis not present

## 2017-09-09 ENCOUNTER — Non-Acute Institutional Stay (SKILLED_NURSING_FACILITY): Payer: Medicare Other | Admitting: Internal Medicine

## 2017-09-09 ENCOUNTER — Encounter: Payer: Self-pay | Admitting: Internal Medicine

## 2017-09-09 DIAGNOSIS — F03A0 Unspecified dementia, mild, without behavioral disturbance, psychotic disturbance, mood disturbance, and anxiety: Secondary | ICD-10-CM

## 2017-09-09 DIAGNOSIS — F0390 Unspecified dementia without behavioral disturbance: Secondary | ICD-10-CM | POA: Diagnosis not present

## 2017-09-09 DIAGNOSIS — M810 Age-related osteoporosis without current pathological fracture: Secondary | ICD-10-CM | POA: Diagnosis not present

## 2017-09-09 DIAGNOSIS — K219 Gastro-esophageal reflux disease without esophagitis: Secondary | ICD-10-CM | POA: Diagnosis not present

## 2017-09-09 DIAGNOSIS — I10 Essential (primary) hypertension: Secondary | ICD-10-CM

## 2017-09-09 NOTE — Progress Notes (Signed)
Location:  [place] Room Number: 3 Place of Service:  SNF [phone]) Provider:  Blanchie Serve MD  Blanchie Serve, MD  Patient Care Team: Blanchie Serve, MD as PCP - General (Internal Medicine) [name], [name], NP as Nurse Practitioner (Nurse Practitioner)  Extended Emergency Contact Information Primary Emergency Contact: [name] Address: [place], [place] [address] [name] of [place] Phone: [phone] Work Phone: [phone] Mobile Phone: [phone] Relation: Daughter Secondary Emergency Contact: [name] Address: [address] [place] [address] [name] of [place] Phone: [phone] Mobile Phone: [phone] Relation: Son  Code Status:  DNR  Goals of care: Advanced Directive information Advanced Directives 09/09/2017  Does Patient Have a Medical Advance Directive? Yes  Type of Paramedic of [place];Living will;Out of facility DNR (pink MOST or yellow form)  Does patient want to make changes to medical advance directive? No - Patient declined  Copy of [place] in Chart? Yes  Pre-existing out of facility DNR order (yellow form or pink MOST form) Yellow form placed in chart (order not valid for inpatient use)     Chief Complaint  Patient presents with  . Medical Management of Chronic Issues    Routine Visit     HPI:  Pt is a 84 y.o. female seen today for medical management of chronic diseases. She denies any acute concern this visit. She is seen in her room. No concern from nursing.   Hypertension- takes amlodipine 5 mg daily and losartan 100 mg daily.  Dementia- stable mood overall. Takes donepezil 5 mg qhs  Osteoporosis- Bone health stable, no fall, takes oscal and vitamin d with weekly fosamax  GERD- controlled symptom. Takes famotidine 20 mg daily.    Past Medical History:  Diagnosis Date  . Abnormality of gait  04/28/2010   History of fall onto right shoulder-has participated in PT. Uses walker.     . Acute encephalopathy 10/25/14   Occurred when septic  . Acute lower GI bleeding 10/30/2014  . Adjustment disorder with mixed anxiety and depressed mood 06/04/2015   11/15/15 Hgb 12.8, Na 139, K 4.2, Bun 14, creat 0.68, TP 5.7, albumin 3.5, TSH 1.54, Hgb a1c 4.4   . Anemia   . Aortic atherosclerosis ([place]) 10/27/2014  . Asthma   . Cerebral atrophy 10/27/2014  . Cerebrovascular disease 10/27/2014   Small vessel disease  . Cervical disc disease   . Chronic lower back pain 06/04/2015   lumbar spondylosis. Regular f/u Dr. French Ana. Receives injections last 01/2013.  05/27/15 Xray left hip/pelvis: spondylotic changes, severe osteoarthritis Continue Tylenol and Tramadol prn for pain, observe the patient, adding Cymbalta may help. Mid to lower back pain more on the right side back, may consider X-ray of thoracic spine. 06/01/15 CT thoracic spine showed no acute changes.  Patient received 10 injection in the back through Dr. Alvester Morin office. She says the previous problems of discomfort in the back and down the leg have completely resolved. Takes Aleve 220mg  bid   . Debility 06/20/2015   11/15/15 Hgb 12.8, Na 139, K 4.2, Bun 14, creat 0.68, TP 5.7, albumin 3.5, TSH 1.54, Hgb a1c 4.4   . Degenerative disc disease, lumbar 10/27/2014  . Depression   . Dermatitis 03/25/2016  . Diarrhea 10/25/14  . Diverticulosis 10/27/2014  . Essential hypertension 09/20/2006   11/15/15 Hgb 12.8, Na  139, K 4.2, Bun 14, creat 0.68, TP 5.7, albumin 3.5, TSH 1.54, Hgb a1c 4.4   . Fall at nursing home 10/25/2014  . GERD (gastroesophageal reflux disease)   . History of cardiovascular disorder 09/20/2006   X 3 in 2000 now on aspirin.    Marland Kitchen Hyperlipidemia 09/20/2006   05/15/14 LDL 95   . Hypertension   . Lumbosacral spondylosis 10/28/2009  . OSTEOPOROSIS 09/20/2006   Noted 2008    . Overactive bladder 11/29/2013   Myrbetriq prescribed by urology.    .  Protein calorie malnutrition ([place])    06/06/15 TP 6.0 albumin 2.9  . SCOLIOSIS 10/28/2009  . Scoliosis 10/27/2014  . Thrombocytosis ([place]) 05/31/2015   06/10/15 plt 602 07/23/15 plt 335 11/15/15 Hgb 12.8, Na 139, K 4.2, Bun 14, creat 0.68, TP 5.7, albumin 3.5, TSH 1.54, Hgb a1c 4.4  . TIA (transient ischemic attack) 09/20/2006   Past Surgical History:  Procedure Laterality Date  . ABDOMINAL HYSTERECTOMY    . APPENDECTOMY    . CHOLECYSTECTOMY    . TONSILLECTOMY      Allergies  Allergen Reactions  . Tramadol Other (See Comments)    Hallucinations, agitation/combativeness  . Amoxicillin     Per MAR  . Oxycodone Other (See Comments)    Per MAR   . Sulfamethoxazole     Per MAR   . Penicillins Rash    Outpatient Encounter Medications as of 09/09/2017  Medication Sig  . acetaminophen (TYLENOL) 325 MG tablet Take 650 mg by mouth every 6 (six) hours as needed.  Marland Kitchen albuterol (PROVENTIL HFA;VENTOLIN HFA) 108 (90 BASE) MCG/ACT inhaler Inhale 2 puffs into the lungs every 6 (six) hours as needed for wheezing or shortness of breath.  Marland Kitchen alendronate (FOSAMAX) 70 MG tablet Take 70 mg by mouth once a week. Take with a full glass of water on an empty stomach.  Marland Kitchen alum & mag hydroxide-simeth (MAALOX/MYLANTA) 200-200-20 MG/5ML suspension Take 30 mLs by mouth every 4 (four) hours as needed for indigestion or heartburn.   Marland Kitchen amLODipine (NORVASC) 5 MG tablet Take 5 mg by mouth daily with breakfast.   . calcium carbonate (OSCAL) 1500 (600 Ca) MG TABS tablet Take 600 mg of elemental calcium by mouth 2 (two) times daily with a meal.  . cetaphil (CETAPHIL) cream Apply 1 application topically daily.  . Cholecalciferol (VITAMIN D3) 50000 units TABS Take 50,000 Units by mouth once a week. Stop date 09/21/17  . clobetasol cream (TEMOVATE) 9.48 % Apply 1 application topically 2 (two) times daily as needed.  . donepezil (ARICEPT) 5 MG tablet Take 5 mg by mouth at bedtime.  . famotidine (PEPCID) 20 MG tablet Take 20 mg by  mouth at bedtime.  Marland Kitchen loperamide (IMODIUM) 2 MG capsule Take 2 mg by mouth as needed for diarrhea or loose stools. Max 16 mg/24 hours call MD if Diarrhea continues  . losartan (COZAAR) 100 MG tablet Take 100 mg by mouth daily with breakfast.   . Mirabegron ER (MYRBETRIQ) 25 MG TB24 Take 25 mg by mouth daily with breakfast. Prescribed by urology  . mirtazapine (REMERON) 7.5 MG tablet Take 7.5 mg by mouth at bedtime.  . nitroGLYCERIN (NITROSTAT) 0.4 MG SL tablet Place 0.4 mg under the tongue every 5 (five) minutes as needed for chest pain. After taking three times and no relief call MD.  . polyethylene glycol (MIRALAX / GLYCOLAX) packet Take 17 g by mouth daily as needed for moderate constipation.   Marland Kitchen pyridOXINE (VITAMIN B-6) 100  MG tablet Take 100 mg by mouth daily with breakfast.    No facility-administered encounter medications on file as of 09/09/2017.     Review of Systems  Constitutional: Negative for appetite change, chills and fever.  HENT: Negative for congestion and postnasal drip.   Eyes: Positive for visual disturbance.  Respiratory: Negative for cough and shortness of breath.   Cardiovascular: Negative for chest pain and palpitations.  Gastrointestinal: Negative for abdominal pain, constipation, diarrhea, nausea and vomiting.  Genitourinary: Negative for dysuria.  Musculoskeletal: Positive for gait problem. Negative for arthralgias.  Neurological: Negative for dizziness and headaches.  Psychiatric/Behavioral: Positive for confusion. Negative for sleep disturbance. The patient is not nervous/anxious.     Immunization History  Administered Date(s) Administered  . HiB (PRP-OMP) 02/12/2004  . Influenza Split 01/11/2012  . Influenza Whole 01/07/2009  . Influenza-Unspecified 01/11/2014, 01/01/2015, 01/29/2016, 02/01/2017  . PPD Test 11/14/2014, 06/03/2015, 06/17/2015  . Pneumococcal Conjugate-13 02/08/2017  . Pneumococcal Polysaccharide-23 04/28/2010  . Tdap 08/24/2011  . Zoster  04/13/2010   Pertinent  Health Maintenance Due  Topic Date Due  . INFLUENZA VACCINE  11/11/2017  . DEXA SCAN  Completed  . PNA vac Low Risk Adult  Completed   Fall Risk  12/29/2016 11/02/2016 06/20/2015 05/23/2015 11/09/2014  Falls in the past year? No Yes No No No  Comment - Emmi Telephone Survey: data to providers prior to load - - -  Number falls in past yr: - 2 or more - - -  Comment - Emmi Telephone Survey Actual Response = 90 - - -  Injury with Fall? - Yes - - -   Functional Status Survey:    Vitals:   09/09/17 1022  BP: 140/70  Pulse: 76  Resp: 16  Temp: (!) 96.8 F (36 C)  TempSrc: Oral  SpO2: 94%  Weight: 160 lb (72.6 kg)  Height: 5' 5.5" (1.664 m)   Body mass index is 26.22 kg/m.   Wt Readings from Last 3 Encounters:  09/09/17 160 lb (72.6 kg)  08/09/17 163 lb 3.2 oz (74 kg)  07/14/17 157 lb 11.2 oz (71.5 kg)   Physical Exam  Constitutional: She appears well-developed and well-nourished. No distress.  HENT:  Head: Normocephalic and atraumatic.  Nose: Nose normal.  Mouth/Throat: Oropharynx is clear and moist.  Eyes: Pupils are equal, round, and reactive to light. Conjunctivae and EOM are normal. Right eye exhibits no discharge. Left eye exhibits no discharge.  Neck: Normal range of motion. Neck supple.  Cardiovascular: Normal rate and regular rhythm.  Pulmonary/Chest: Effort normal and breath sounds normal. No respiratory distress. She has no wheezes. She has no rales.  Abdominal: Soft. Bowel sounds are normal. There is no tenderness.  Musculoskeletal: She exhibits edema.  Able to move all 4 extremities, limited ROM with shoulders, ambulates with wheelchair  Lymphadenopathy:    She has no cervical adenopathy.  Neurological: She is alert.  Oriented to self and place only  Skin: Skin is warm and dry. She is not diaphoretic.  Psychiatric: She has a normal mood and affect.    Labs reviewed: Recent Labs    03/09/17 06/24/17 07/13/17  NA 141 139 141  K 4.2  4.2 3.6  BUN 14 16 17   CREATININE 0.8 0.8 0.8   Recent Labs    10/08/16 03/09/17 06/24/17  AST 23 25 25   ALT 22 23  --   ALKPHOS 57 61 47   Recent Labs    10/08/16 03/09/17 07/13/17  WBC 7.0 8.6  7.4  HGB 13.9 15.0 12.4  HCT 39 43 36  PLT 375 392 385   Lab Results  Component Value Date   TSH 1.90 06/24/2017   Lab Results  Component Value Date   HGBA1C 4.4 11/14/2015   Lab Results  Component Value Date   CHOL 176 05/15/2014   HDL 62 05/15/2014   LDLCALC 95 05/15/2014   LDLDIRECT 114.3 12/08/2007   TRIG 93 05/15/2014   CHOLHDL 2.9 CALC 12/08/2007    Significant Diagnostic Results in last 30 days:  No results found.  Assessment/Plan  1. Essential hypertension Stable, continue amlodipine and losartan. Monitor BP, BMP reviewed.  2. Gastroesophageal reflux disease without esophagitis Stable, decrease famotidine to 10 mg daily and monitor  3. Age-related osteoporosis without current pathological fracture Continue weekly fosamax and daily calcium and vitamin D  4. Mild dementia Continue donepezil with mirtazapine. Supportive care.     Family/ staff Communication: reviewed care plan with patient and charge nurse.    Labs/tests ordered:  none   Blanchie Serve, MD Internal Medicine [facility] [address] [place], [place] [address] Cell Phone (Monday-Friday 8 am - 5 pm): [phone] On Call: [phone] and follow prompts after 5 pm and on weekends Office Phone: [phone] Office Fax: [phone]

## 2017-09-13 ENCOUNTER — Non-Acute Institutional Stay (SKILLED_NURSING_FACILITY): Payer: Medicare Other | Admitting: Nurse Practitioner

## 2017-09-13 ENCOUNTER — Encounter: Payer: Self-pay | Admitting: Nurse Practitioner

## 2017-09-13 DIAGNOSIS — F0390 Unspecified dementia without behavioral disturbance: Secondary | ICD-10-CM

## 2017-09-13 DIAGNOSIS — R600 Localized edema: Secondary | ICD-10-CM | POA: Diagnosis not present

## 2017-09-13 DIAGNOSIS — S81801A Unspecified open wound, right lower leg, initial encounter: Secondary | ICD-10-CM | POA: Diagnosis not present

## 2017-09-13 DIAGNOSIS — F03A0 Unspecified dementia, mild, without behavioral disturbance, psychotic disturbance, mood disturbance, and anxiety: Secondary | ICD-10-CM

## 2017-09-13 NOTE — Progress Notes (Addendum)
Location:  [place] Room Number: 3 Place of Service:  SNF (31) Provider:  Rayon Mcchristian, ManXie  NP  Blanchie Serve, MD  Patient Care Team: Blanchie Serve, MD as PCP - General (Internal Medicine) Musette Kisamore X, NP as Nurse Practitioner (Nurse Practitioner)  Extended Emergency Contact Information Primary Emergency Contact: [name] Address: [place], [place] [address] [name] of [place] Phone: [phone] Work Phone: [phone] Mobile Phone: [phone] Relation: Daughter Secondary Emergency Contact: [name] Address: [address] [place] [address] [name] of [place] Phone: [phone] Mobile Phone: [phone] Relation: Son  Code Status:  DNR Goals of care: Advanced Directive information Advanced Directives 09/13/2017  Does Patient Have a Medical Advance Directive? Yes  Type of Paramedic of [place];Living will;Out of facility DNR (pink MOST or yellow form)  Does patient want to make changes to medical advance directive? No - Patient declined  Copy of [place] in Chart? Yes  Pre-existing out of facility DNR order (yellow form or pink MOST form) Yellow form placed in chart (order not valid for inpatient use)     Chief Complaint  Patient presents with  . Acute Visit    (R) leg open area- weeping    HPI:  Pt is a 84 y.o. female seen today for an acute visit for right lower leg re-opened areas with weeping. There area 3 small open areas sustained when the air cast was applied for the ankle fracture. She has chronic edema BLE, the R>L, she denied cough, SOB, sputum production, chest pain/pressrue or palpitation. She is afebrile, no O2 desaturation. HPI was provided with assistance of staff due to her dementia. She resides in [facility], w/c for mobility, on Donepezil 5mg  daily for memory.    Past Medical History:  Diagnosis Date  .  Abnormality of gait 04/28/2010   History of fall onto right shoulder-has participated in PT. Uses walker.     . Acute encephalopathy 10/25/14   Occurred when septic  . Acute lower GI bleeding 10/30/2014  . Adjustment disorder with mixed anxiety and depressed mood 06/04/2015   11/15/15 Hgb 12.8, Na 139, K 4.2, Bun 14, creat 0.68, TP 5.7, albumin 3.5, TSH 1.54, Hgb a1c 4.4   . Anemia   . Aortic atherosclerosis ([place]) 10/27/2014  . Asthma   . Cerebral atrophy 10/27/2014  . Cerebrovascular disease 10/27/2014   Small vessel disease  . Cervical disc disease   . Chronic lower back pain 06/04/2015   lumbar spondylosis. Regular f/u Dr. French Ana. Receives injections last 01/2013.  05/27/15 Xray left hip/pelvis: spondylotic changes, severe osteoarthritis Continue Tylenol and Tramadol prn for pain, observe the patient, adding Cymbalta may help. Mid to lower back pain more on the right side back, may consider X-ray of thoracic spine. 06/01/15 CT thoracic spine showed no acute changes.  Patient received 10 injection in the back through Dr. Alvester Morin office. She says the previous problems of discomfort in the back and down the leg have completely resolved. Takes Aleve 220mg  bid   . Debility 06/20/2015   11/15/15 Hgb 12.8, Na 139, K 4.2, Bun 14, creat 0.68, TP 5.7, albumin 3.5, TSH 1.54, Hgb a1c 4.4   . Degenerative disc disease, lumbar 10/27/2014  . Depression   . Dermatitis 03/25/2016  . Diarrhea 10/25/14  . Diverticulosis 10/27/2014  . Essential hypertension 09/20/2006  11/15/15 Hgb 12.8, Na 139, K 4.2, Bun 14, creat 0.68, TP 5.7, albumin 3.5, TSH 1.54, Hgb a1c 4.4   . Fall at nursing home 10/25/2014  . GERD (gastroesophageal reflux disease)   . History of cardiovascular disorder 09/20/2006   X 3 in 2000 now on aspirin.    Marland Kitchen Hyperlipidemia 09/20/2006   05/15/14 LDL 95   . Hypertension   . Lumbosacral spondylosis 10/28/2009  . OSTEOPOROSIS 09/20/2006   Noted 2008    . Overactive bladder 11/29/2013   Myrbetriq prescribed by  urology.    . Protein calorie malnutrition ([place])    06/06/15 TP 6.0 albumin 2.9  . SCOLIOSIS 10/28/2009  . Scoliosis 10/27/2014  . Thrombocytosis ([place]) 05/31/2015   06/10/15 plt 602 07/23/15 plt 335 11/15/15 Hgb 12.8, Na 139, K 4.2, Bun 14, creat 0.68, TP 5.7, albumin 3.5, TSH 1.54, Hgb a1c 4.4  . TIA (transient ischemic attack) 09/20/2006   Past Surgical History:  Procedure Laterality Date  . ABDOMINAL HYSTERECTOMY    . APPENDECTOMY    . CHOLECYSTECTOMY    . TONSILLECTOMY      Allergies  Allergen Reactions  . Tramadol Other (See Comments)    Hallucinations, agitation/combativeness  . Amoxicillin     Per MAR  . Oxycodone Other (See Comments)    Per MAR   . Sulfamethoxazole     Per MAR   . Penicillins Rash    Outpatient Encounter Medications as of 09/13/2017  Medication Sig  . acetaminophen (TYLENOL) 325 MG tablet Take 650 mg by mouth every 6 (six) hours as needed.  Marland Kitchen albuterol (PROVENTIL HFA;VENTOLIN HFA) 108 (90 BASE) MCG/ACT inhaler Inhale 2 puffs into the lungs every 6 (six) hours as needed for wheezing or shortness of breath.  Marland Kitchen alendronate (FOSAMAX) 70 MG tablet Take 70 mg by mouth once a week. Take with a full glass of water on an empty stomach.  Marland Kitchen alum & mag hydroxide-simeth (MAALOX/MYLANTA) 200-200-20 MG/5ML suspension Take 30 mLs by mouth every 4 (four) hours as needed for indigestion or heartburn.   Marland Kitchen amLODipine (NORVASC) 5 MG tablet Take 5 mg by mouth daily with breakfast.   . calcium carbonate (OSCAL) 1500 (600 Ca) MG TABS tablet Take 600 mg of elemental calcium by mouth 2 (two) times daily with a meal.  . cetaphil (CETAPHIL) cream Apply 1 application topically daily.  . Cholecalciferol (VITAMIN D3) 50000 units TABS Take 50,000 Units by mouth once a week. Stop date 09/21/17  . clobetasol cream (TEMOVATE) 7.51 % Apply 1 application topically 2 (two) times daily as needed.  . donepezil (ARICEPT) 5 MG tablet Take 5 mg by mouth at bedtime.  . famotidine (PEPCID) 20 MG tablet  Take 10 mg by mouth at bedtime.  Marland Kitchen loperamide (IMODIUM) 2 MG capsule Take 2 mg by mouth as needed for diarrhea or loose stools. Max 16 mg/24 hours call MD if Diarrhea continues  . losartan (COZAAR) 100 MG tablet Take 100 mg by mouth daily with breakfast.   . Mirabegron ER (MYRBETRIQ) 25 MG TB24 Take 25 mg by mouth daily with breakfast. Prescribed by urology  . mirtazapine (REMERON) 7.5 MG tablet Take 7.5 mg by mouth at bedtime.  . nitroGLYCERIN (NITROSTAT) 0.4 MG SL tablet Place 0.4 mg under the tongue every 5 (five) minutes as needed for chest pain. After taking three times and no relief call MD.  . polyethylene glycol (MIRALAX / GLYCOLAX) packet Take 17 g by mouth daily as needed for moderate constipation.   Marland Kitchen  pyridOXINE (VITAMIN B-6) 100 MG tablet Take 100 mg by mouth daily with breakfast.    No facility-administered encounter medications on file as of 09/13/2017.    ROS was provided with assistance of staff.  Review of Systems  Constitutional: Negative for activity change, appetite change, chills, diaphoresis, fatigue and fever.  HENT: Positive for hearing loss. Negative for congestion, trouble swallowing and voice change.   Respiratory: Negative for cough, chest tightness, shortness of breath and wheezing.   Cardiovascular: Positive for leg swelling. Negative for chest pain and palpitations.  Musculoskeletal: Positive for gait problem.  Skin: Positive for wound. Negative for color change and pallor.       3x small open areas right lower leg.   Neurological: Negative for dizziness, speech difficulty, weakness and light-headedness.       Memory lapses.   Psychiatric/Behavioral: Negative for agitation, hallucinations and sleep disturbance. The patient is not nervous/anxious.     Immunization History  Administered Date(s) Administered  . HiB (PRP-OMP) 02/12/2004  . Influenza Split 01/11/2012  . Influenza Whole 01/07/2009  . Influenza-Unspecified 01/11/2014, 01/01/2015, 01/29/2016,  02/01/2017  . PPD Test 11/14/2014, 06/03/2015, 06/17/2015  . Pneumococcal Conjugate-13 02/08/2017  . Pneumococcal Polysaccharide-23 04/28/2010  . Tdap 08/24/2011  . Zoster 04/13/2010   Pertinent  Health Maintenance Due  Topic Date Due  . INFLUENZA VACCINE  11/11/2017  . DEXA SCAN  Completed  . PNA vac Low Risk Adult  Completed   Fall Risk  12/29/2016 11/02/2016 06/20/2015 05/23/2015 11/09/2014  Falls in the past year? No Yes No No No  Comment - Emmi Telephone Survey: data to providers prior to load - - -  Number falls in past yr: - 2 or more - - -  Comment - Emmi Telephone Survey Actual Response = 90 - - -  Injury with Fall? - Yes - - -   Functional Status Survey:    Vitals:   09/13/17 1138  BP: 130/70  Pulse: 70  Resp: 18  Temp: 97.8 F (36.6 C)  SpO2: 98%  Weight: 159 lb 6.4 oz (72.3 kg)  Height: 5' 5.5" (1.664 m)   Body mass index is 26.12 kg/m. Physical Exam  Constitutional: She appears well-developed and well-nourished. No distress.  Neck: Normal range of motion. Neck supple. No JVD present. No thyromegaly present.  Cardiovascular: Normal rate and regular rhythm.  Pulmonary/Chest: Effort normal and breath sounds normal. She has no wheezes. She has no rales.  Abdominal: She exhibits no distension. There is no tenderness.  Musculoskeletal: She exhibits edema.  Edema 1+ RLE, trace LLE  Neurological: She is alert. No cranial nerve deficit. She exhibits normal muscle tone. Coordination normal.  Oriented to person and place.   Skin: Skin is warm and dry. She is not diaphoretic.  3 open areas <0.5cm in diameter, superficial, mild erythema at margin of wounds. Serous drainage with no apparent odor.   Psychiatric: She has a normal mood and affect. Her behavior is normal.    Labs reviewed: Recent Labs    03/09/17 06/24/17 07/13/17  NA 141 139 141  K 4.2 4.2 3.6  BUN 14 16 17   CREATININE 0.8 0.8 0.8   Recent Labs    10/08/16 03/09/17 06/24/17  AST 23 25 25   ALT 22 23   --   ALKPHOS 57 61 47   Recent Labs    10/08/16 03/09/17 07/13/17  WBC 7.0 8.6 7.4  HGB 13.9 15.0 12.4  HCT 39 43 36  PLT 375 392 385  Lab Results  Component Value Date   TSH 1.90 06/24/2017   Lab Results  Component Value Date   HGBA1C 4.4 11/14/2015   Lab Results  Component Value Date   CHOL 176 05/15/2014   HDL 62 05/15/2014   LDLCALC 95 05/15/2014   LDLDIRECT 114.3 12/08/2007   TRIG 93 05/15/2014   CHOLHDL 2.9 CALC 12/08/2007    Significant Diagnostic Results in last 30 days:  No results found.  Assessment/Plan Leg wound, right, initial encounter Middle of the anterior aspect of the right lower leg, 3x less than 0.5cm superficial open areas noted, mild erythema at the edge of wound, serous drainage noted, will apply Bactroban ointment daily x 10days, then cover with  protective dressing, mild diuresis may facilitate healing. Observe.   Edema BLE, 1+ R , trace L, no cordlike finding on palpation, no pain noted, no apparent fever or odor upon my examination. Will add Furosemide 10mg  daily, update BMP in one week. May consider dc Amlodipine since this may contribute to peripheral edema.   Mild dementia Continue SN[facility] for care assistance, continue Donepezil 5mg  daily to preserve memory.  +    Family/ staff Communication: plan of care reviewed with the patient and charge nurse.   Labs/tests ordered:  BMP   Time spend 25 minutes.

## 2017-09-13 NOTE — Assessment & Plan Note (Addendum)
Middle of the anterior aspect of the right lower leg, 3x less than 0.5cm superficial open areas noted, mild erythema at the edge of wound, serous drainage noted, will apply Bactroban ointment daily x 10days, then cover with  protective dressing, mild diuresis may facilitate healing. Observe.

## 2017-09-13 NOTE — Assessment & Plan Note (Signed)
Continue SN[facility] for care assistance, continue Donepezil 5mg  daily to preserve memory.

## 2017-09-13 NOTE — Assessment & Plan Note (Addendum)
BLE, 1+ R , trace L, no cordlike finding on palpation, no pain noted, no apparent fever or odor upon my examination. Will add Furosemide 10mg  daily, update BMP in one week. May consider dc Amlodipine since this may contribute to peripheral edema.

## 2017-09-21 ENCOUNTER — Other Ambulatory Visit: Payer: Self-pay | Admitting: *Deleted

## 2017-09-21 DIAGNOSIS — R609 Edema, unspecified: Secondary | ICD-10-CM | POA: Diagnosis not present

## 2017-09-21 LAB — BASIC METABOLIC PANEL
BUN: 15 (ref 4–21)
CO2: 28
CREATININE: 0.8 (ref <=1.1)
Calcium: 9.3
Chloride: 111
EGFR (Non-African Amer.): 70
Glucose: 95
POTASSIUM: 3.5 (ref 3.4–5.3)
Sodium: 143 (ref 137–147)

## 2017-09-22 DIAGNOSIS — S81801A Unspecified open wound, right lower leg, initial encounter: Secondary | ICD-10-CM | POA: Diagnosis not present

## 2017-09-22 DIAGNOSIS — M25529 Pain in unspecified elbow: Secondary | ICD-10-CM | POA: Diagnosis not present

## 2017-09-22 DIAGNOSIS — R1312 Dysphagia, oropharyngeal phase: Secondary | ICD-10-CM | POA: Diagnosis not present

## 2017-09-22 DIAGNOSIS — H81399 Other peripheral vertigo, unspecified ear: Secondary | ICD-10-CM | POA: Diagnosis not present

## 2017-09-22 DIAGNOSIS — L853 Xerosis cutis: Secondary | ICD-10-CM | POA: Diagnosis not present

## 2017-09-22 DIAGNOSIS — G459 Transient cerebral ischemic attack, unspecified: Secondary | ICD-10-CM | POA: Diagnosis not present

## 2017-09-22 DIAGNOSIS — M47897 Other spondylosis, lumbosacral region: Secondary | ICD-10-CM | POA: Diagnosis not present

## 2017-09-22 DIAGNOSIS — K219 Gastro-esophageal reflux disease without esophagitis: Secondary | ICD-10-CM | POA: Diagnosis not present

## 2017-09-22 DIAGNOSIS — E162 Hypoglycemia, unspecified: Secondary | ICD-10-CM | POA: Diagnosis not present

## 2017-09-22 DIAGNOSIS — R600 Localized edema: Secondary | ICD-10-CM | POA: Diagnosis not present

## 2017-09-22 DIAGNOSIS — M6281 Muscle weakness (generalized): Secondary | ICD-10-CM | POA: Diagnosis not present

## 2017-09-22 DIAGNOSIS — R278 Other lack of coordination: Secondary | ICD-10-CM | POA: Diagnosis not present

## 2017-09-22 DIAGNOSIS — Z9181 History of falling: Secondary | ICD-10-CM | POA: Diagnosis not present

## 2017-09-22 DIAGNOSIS — M5090 Cervical disc disorder, unspecified, unspecified cervical region: Secondary | ICD-10-CM | POA: Diagnosis not present

## 2017-09-22 DIAGNOSIS — M818 Other osteoporosis without current pathological fracture: Secondary | ICD-10-CM | POA: Diagnosis not present

## 2017-09-22 DIAGNOSIS — R2681 Unsteadiness on feet: Secondary | ICD-10-CM | POA: Diagnosis not present

## 2017-09-22 DIAGNOSIS — I10 Essential (primary) hypertension: Secondary | ICD-10-CM | POA: Diagnosis not present

## 2017-09-22 DIAGNOSIS — K2971 Gastritis, unspecified, with bleeding: Secondary | ICD-10-CM | POA: Diagnosis not present

## 2017-09-22 DIAGNOSIS — R41841 Cognitive communication deficit: Secondary | ICD-10-CM | POA: Diagnosis not present

## 2017-09-22 DIAGNOSIS — R262 Difficulty in walking, not elsewhere classified: Secondary | ICD-10-CM | POA: Diagnosis not present

## 2017-09-23 DIAGNOSIS — L853 Xerosis cutis: Secondary | ICD-10-CM | POA: Diagnosis not present

## 2017-09-23 DIAGNOSIS — R600 Localized edema: Secondary | ICD-10-CM | POA: Diagnosis not present

## 2017-09-23 DIAGNOSIS — S81801A Unspecified open wound, right lower leg, initial encounter: Secondary | ICD-10-CM | POA: Diagnosis not present

## 2017-09-23 DIAGNOSIS — M6281 Muscle weakness (generalized): Secondary | ICD-10-CM | POA: Diagnosis not present

## 2017-09-23 DIAGNOSIS — R2681 Unsteadiness on feet: Secondary | ICD-10-CM | POA: Diagnosis not present

## 2017-09-23 DIAGNOSIS — R278 Other lack of coordination: Secondary | ICD-10-CM | POA: Diagnosis not present

## 2017-09-28 DIAGNOSIS — M6281 Muscle weakness (generalized): Secondary | ICD-10-CM | POA: Diagnosis not present

## 2017-09-28 DIAGNOSIS — L853 Xerosis cutis: Secondary | ICD-10-CM | POA: Diagnosis not present

## 2017-09-28 DIAGNOSIS — R278 Other lack of coordination: Secondary | ICD-10-CM | POA: Diagnosis not present

## 2017-09-28 DIAGNOSIS — S81801A Unspecified open wound, right lower leg, initial encounter: Secondary | ICD-10-CM | POA: Diagnosis not present

## 2017-09-28 DIAGNOSIS — R600 Localized edema: Secondary | ICD-10-CM | POA: Diagnosis not present

## 2017-09-28 DIAGNOSIS — E559 Vitamin D deficiency, unspecified: Secondary | ICD-10-CM | POA: Diagnosis not present

## 2017-09-28 DIAGNOSIS — R2681 Unsteadiness on feet: Secondary | ICD-10-CM | POA: Diagnosis not present

## 2017-09-28 LAB — VITAMIN D 25 HYDROXY (VIT D DEFICIENCY, FRACTURES): VIT D 25 HYDROXY: 36

## 2017-09-29 ENCOUNTER — Other Ambulatory Visit: Payer: Self-pay | Admitting: *Deleted

## 2017-09-30 DIAGNOSIS — R278 Other lack of coordination: Secondary | ICD-10-CM | POA: Diagnosis not present

## 2017-09-30 DIAGNOSIS — R2681 Unsteadiness on feet: Secondary | ICD-10-CM | POA: Diagnosis not present

## 2017-09-30 DIAGNOSIS — R600 Localized edema: Secondary | ICD-10-CM | POA: Diagnosis not present

## 2017-09-30 DIAGNOSIS — M6281 Muscle weakness (generalized): Secondary | ICD-10-CM | POA: Diagnosis not present

## 2017-09-30 DIAGNOSIS — L853 Xerosis cutis: Secondary | ICD-10-CM | POA: Diagnosis not present

## 2017-09-30 DIAGNOSIS — S81801A Unspecified open wound, right lower leg, initial encounter: Secondary | ICD-10-CM | POA: Diagnosis not present

## 2017-10-04 DIAGNOSIS — M6281 Muscle weakness (generalized): Secondary | ICD-10-CM | POA: Diagnosis not present

## 2017-10-04 DIAGNOSIS — R278 Other lack of coordination: Secondary | ICD-10-CM | POA: Diagnosis not present

## 2017-10-04 DIAGNOSIS — R2681 Unsteadiness on feet: Secondary | ICD-10-CM | POA: Diagnosis not present

## 2017-10-04 DIAGNOSIS — R600 Localized edema: Secondary | ICD-10-CM | POA: Diagnosis not present

## 2017-10-04 DIAGNOSIS — S81801A Unspecified open wound, right lower leg, initial encounter: Secondary | ICD-10-CM | POA: Diagnosis not present

## 2017-10-04 DIAGNOSIS — L853 Xerosis cutis: Secondary | ICD-10-CM | POA: Diagnosis not present

## 2017-10-07 DIAGNOSIS — L853 Xerosis cutis: Secondary | ICD-10-CM | POA: Diagnosis not present

## 2017-10-07 DIAGNOSIS — M6281 Muscle weakness (generalized): Secondary | ICD-10-CM | POA: Diagnosis not present

## 2017-10-07 DIAGNOSIS — R278 Other lack of coordination: Secondary | ICD-10-CM | POA: Diagnosis not present

## 2017-10-07 DIAGNOSIS — S81801A Unspecified open wound, right lower leg, initial encounter: Secondary | ICD-10-CM | POA: Diagnosis not present

## 2017-10-07 DIAGNOSIS — R600 Localized edema: Secondary | ICD-10-CM | POA: Diagnosis not present

## 2017-10-07 DIAGNOSIS — R2681 Unsteadiness on feet: Secondary | ICD-10-CM | POA: Diagnosis not present

## 2017-10-08 ENCOUNTER — Non-Acute Institutional Stay (SKILLED_NURSING_FACILITY): Payer: Medicare Other | Admitting: Nurse Practitioner

## 2017-10-08 ENCOUNTER — Encounter: Payer: Self-pay | Admitting: Nurse Practitioner

## 2017-10-08 DIAGNOSIS — N3281 Overactive bladder: Secondary | ICD-10-CM

## 2017-10-08 DIAGNOSIS — F0390 Unspecified dementia without behavioral disturbance: Secondary | ICD-10-CM | POA: Diagnosis not present

## 2017-10-08 DIAGNOSIS — F5105 Insomnia due to other mental disorder: Secondary | ICD-10-CM

## 2017-10-08 DIAGNOSIS — I10 Essential (primary) hypertension: Secondary | ICD-10-CM

## 2017-10-08 DIAGNOSIS — F418 Other specified anxiety disorders: Secondary | ICD-10-CM | POA: Diagnosis not present

## 2017-10-08 DIAGNOSIS — L853 Xerosis cutis: Secondary | ICD-10-CM | POA: Insufficient documentation

## 2017-10-08 DIAGNOSIS — R600 Localized edema: Secondary | ICD-10-CM

## 2017-10-08 DIAGNOSIS — F03A0 Unspecified dementia, mild, without behavioral disturbance, psychotic disturbance, mood disturbance, and anxiety: Secondary | ICD-10-CM

## 2017-10-08 MED ORDER — FUROSEMIDE 20 MG PO TABS: 10.0000 mg | ORAL_TABLET | Freq: Every day | ORAL | 3 refills | Status: DC

## 2017-10-08 NOTE — Assessment & Plan Note (Signed)
Hx of HTN, blood pressure is controlled, continue Amlodipine 5mg  qd, Losartan 100mg  qd.

## 2017-10-08 NOTE — Assessment & Plan Note (Signed)
dementia, resides in [facility], w/c for mobility, continu e Donepezil 5mg  for memory.

## 2017-10-08 NOTE — Assessment & Plan Note (Signed)
Her mood is stable, sleeps well, continue Mirtazapine 7.5mg  qd.

## 2017-10-08 NOTE — Assessment & Plan Note (Signed)
Jill Side frequency is managed, continue Myrbetriq 25mg  qd.

## 2017-10-08 NOTE — Progress Notes (Signed)
Location:   S[facility] Room Number: 3 Place of Service:  SNF (31) Provider: [facility] Ulyses Panico NP  Blanchie Serve, MD  Patient Care Team: Blanchie Serve, MD as PCP - General (Internal Medicine) Jaquane Boughner X, NP as Nurse Practitioner (Nurse Practitioner)  Extended Emergency Contact Information Primary Emergency Contact: [name] Address: [place], [place] [address] [name] of [place] Phone: [phone] Work Phone: [phone] Mobile Phone: [phone] Relation: Daughter Secondary Emergency Contact: [name] Address: [address] [place] [address] [name] of [place] Phone: [phone] Mobile Phone: [phone] Relation: Son  Code Status:  DNR Goals of care: Advanced Directive information Advanced Directives 09/13/2017  Does Patient Have a Medical Advance Directive? Yes  Type of Paramedic of [place];Living will;Out of facility DNR (pink MOST or yellow form)  Does patient want to make changes to medical advance directive? No - Patient declined  Copy of [place] in Chart? Yes  Pre-existing out of facility DNR order (yellow form or pink MOST form) Yellow form placed in chart (order not valid for inpatient use)     Chief Complaint  Patient presents with  . Medical Management of Chronic Issues    HPI:  Pt is a 84 y.o. female seen today for medical management of chronic diseases.     The patient has history of dementia, resides in [facility], w/c for mobility, taking Donepezil 5mg  for memory. Hx of HTN, blood pressure is controlled on Amlodipine 5mg  qd, Losartan 100mg  qd. Her mood is stable on Mirtazapine 7.5mg  qd. Jill Side frequency is managed on Myrbetriq 25mg  qd. Chronic dry skin, itching scratching, regular use moisturizer helped in the past. BLE edema is stable on Furosemide 10mg  since 09/13/17 Past Medical History:  Diagnosis Date  . Abnormality  of gait 04/28/2010   History of fall onto right shoulder-has participated in PT. Uses walker.     . Acute encephalopathy 10/25/14   Occurred when septic  . Acute lower GI bleeding 10/30/2014  . Adjustment disorder with mixed anxiety and depressed mood 06/04/2015   11/15/15 Hgb 12.8, Na 139, K 4.2, Bun 14, creat 0.68, TP 5.7, albumin 3.5, TSH 1.54, Hgb a1c 4.4   . Anemia   . Aortic atherosclerosis ([place]) 10/27/2014  . Asthma   . Cerebral atrophy 10/27/2014  . Cerebrovascular disease 10/27/2014   Small vessel disease  . Cervical disc disease   . Chronic lower back pain 06/04/2015   lumbar spondylosis. Regular f/u Dr. French Ana. Receives injections last 01/2013.  05/27/15 Xray left hip/pelvis: spondylotic changes, severe osteoarthritis Continue Tylenol and Tramadol prn for pain, observe the patient, adding Cymbalta may help. Mid to lower back pain more on the right side back, may consider X-ray of thoracic spine. 06/01/15 CT thoracic spine showed no acute changes.  Patient received 10 injection in the back through Dr. Alvester Morin office. She says the previous problems of discomfort in the back and down the leg have completely resolved. Takes Aleve 220mg  bid   . Debility 06/20/2015   11/15/15 Hgb 12.8, Na 139, K 4.2, Bun 14, creat 0.68, TP 5.7, albumin 3.5, TSH 1.54, Hgb a1c 4.4   . Degenerative disc disease, lumbar 10/27/2014  . Depression   . Dermatitis 03/25/2016  . Diarrhea 10/25/14  . Diverticulosis 10/27/2014  . Essential hypertension 09/20/2006   11/15/15 Hgb 12.8, Na 139, K  4.2, Bun 14, creat 0.68, TP 5.7, albumin 3.5, TSH 1.54, Hgb a1c 4.4   . Fall at nursing home 10/25/2014  . GERD (gastroesophageal reflux disease)   . History of cardiovascular disorder 09/20/2006   X 3 in 2000 now on aspirin.    Marland Kitchen Hyperlipidemia 09/20/2006   05/15/14 LDL 95   . Hypertension   . Lumbosacral spondylosis 10/28/2009  . OSTEOPOROSIS 09/20/2006   Noted 2008    . Overactive bladder 11/29/2013   Myrbetriq prescribed by urology.      . Protein calorie malnutrition ([place])    06/06/15 TP 6.0 albumin 2.9  . SCOLIOSIS 10/28/2009  . Scoliosis 10/27/2014  . Thrombocytosis ([place]) 05/31/2015   06/10/15 plt 602 07/23/15 plt 335 11/15/15 Hgb 12.8, Na 139, K 4.2, Bun 14, creat 0.68, TP 5.7, albumin 3.5, TSH 1.54, Hgb a1c 4.4  . TIA (transient ischemic attack) 09/20/2006   Past Surgical History:  Procedure Laterality Date  . ABDOMINAL HYSTERECTOMY    . APPENDECTOMY    . CHOLECYSTECTOMY    . TONSILLECTOMY      Allergies  Allergen Reactions  . Tramadol Other (See Comments)    Hallucinations, agitation/combativeness  . Amoxicillin     Per MAR  . Oxycodone Other (See Comments)    Per MAR   . Sulfamethoxazole     Per MAR   . Penicillins Rash    Allergies as of 10/08/2017      Reactions   Tramadol Other (See Comments)   Hallucinations, agitation/combativeness   Amoxicillin    Per MAR   Oxycodone Other (See Comments)   Per MAR    Sulfamethoxazole    Per MAR    Penicillins Rash      Medication List        Accurate as of 10/08/17 11:59 PM. Always use your most recent med list.          acetaminophen 325 MG tablet Commonly known as:  TYLENOL Take 650 mg by mouth every 6 (six) hours as needed.   albuterol 108 (90 Base) MCG/ACT inhaler Commonly known as:  PROVENTIL HFA;VENTOLIN HFA Inhale 2 puffs into the lungs every 6 (six) hours as needed for wheezing or shortness of breath.   alendronate 70 MG tablet Commonly known as:  FOSAMAX Take 70 mg by mouth once a week. Take with a full glass of water on an empty stomach.   alum & mag hydroxide-simeth 200-200-20 MG/5ML suspension Commonly known as:  MAALOX/MYLANTA Take 30 mLs by mouth every 4 (four) hours as needed for indigestion or heartburn.   amLODipine 5 MG tablet Commonly known as:  NORVASC Take 5 mg by mouth daily with breakfast.   calcium carbonate 1500 (600 Ca) MG Tabs tablet Commonly known as:  OSCAL Take 600 mg of elemental calcium by mouth 2 (two) times  daily with a meal.   cetaphil cream Apply 1 application topically daily.   clobetasol cream 0.05 % Commonly known as:  TEMOVATE Apply 1 application topically 2 (two) times daily as needed.   donepezil 5 MG tablet Commonly known as:  ARICEPT Take 5 mg by mouth at bedtime.   famotidine 20 MG tablet Commonly known as:  PEPCID Take 10 mg by mouth at bedtime.   furosemide 20 MG tablet Commonly known as:  LASIX Take 0.5 tablets (10 mg total) by mouth daily.   loperamide 2 MG capsule Commonly known as:  IMODIUM Take 2 mg by mouth as needed for diarrhea or loose stools. Max 16  mg/24 hours call MD if Diarrhea continues   losartan 100 MG tablet Commonly known as:  COZAAR Take 100 mg by mouth daily with breakfast.   mirtazapine 7.5 MG tablet Commonly known as:  REMERON Take 7.5 mg by mouth at bedtime.   MYRBETRIQ 25 MG Tb24 tablet Generic drug:  mirabegron ER Take 25 mg by mouth daily with breakfast. Prescribed by urology   nitroGLYCERIN 0.4 MG SL tablet Commonly known as:  NITROSTAT Place 0.4 mg under the tongue every 5 (five) minutes as needed for chest pain. After taking three times and no relief call MD.   polyethylene glycol packet Commonly known as:  MIRALAX / GLYCOLAX Take 17 g by mouth daily as needed for moderate constipation.   pyridOXINE 100 MG tablet Commonly known as:  VITAMIN B-6 Take 100 mg by mouth daily with breakfast.   Vitamin D3 50000 units Tabs Take 50,000 Units by mouth once a week. Stop date 09/21/17       Review of Systems  Constitutional: Negative for activity change, appetite change, chills, diaphoresis, fatigue and fever.  HENT: Positive for hearing loss. Negative for congestion, trouble swallowing and voice change.   Respiratory: Negative for cough and shortness of breath.   Cardiovascular: Positive for leg swelling. Negative for chest pain and palpitations.  Gastrointestinal: Negative for abdominal distention, abdominal pain, constipation,  diarrhea, nausea and vomiting.  Genitourinary: Negative for difficulty urinating, dysuria and urgency.       Occasional incontinent of urine.   Musculoskeletal: Positive for gait problem.  Skin: Negative for color change and pallor.       Dry skin, scratched marks on arms/legs.   Neurological: Negative for dizziness, speech difficulty, weakness and headaches.       Dementia.   Psychiatric/Behavioral: Negative for agitation, behavioral problems, hallucinations and sleep disturbance. The patient is not nervous/anxious.     Immunization History  Administered Date(s) Administered  . HiB (PRP-OMP) 02/12/2004  . Influenza Split 01/11/2012  . Influenza Whole 01/07/2009  . Influenza-Unspecified 01/11/2014, 01/01/2015, 01/29/2016, 02/01/2017  . PPD Test 11/14/2014, 06/03/2015, 06/17/2015  . Pneumococcal Conjugate-13 02/08/2017  . Pneumococcal Polysaccharide-23 04/28/2010  . Tdap 08/24/2011  . Zoster 04/13/2010   Pertinent  Health Maintenance Due  Topic Date Due  . INFLUENZA VACCINE  11/11/2017  . DEXA SCAN  Completed  . PNA vac Low Risk Adult  Completed   Fall Risk  12/29/2016 11/02/2016 06/20/2015 05/23/2015 11/09/2014  Falls in the past year? No Yes No No No  Comment - Emmi Telephone Survey: data to providers prior to load - - -  Number falls in past yr: - 2 or more - - -  Comment - Emmi Telephone Survey Actual Response = 90 - - -  Injury with Fall? - Yes - - -   Functional Status Survey:    Vitals:   10/08/17 1332  BP: (!) 146/62  Pulse: 78  Resp: 16  Temp: 97.6 F (36.4 C)  SpO2: 95%   There is no height or weight on file to calculate BMI. Physical Exam  Constitutional: She appears well-developed and well-nourished.  HENT:  Head: Normocephalic and atraumatic.  Eyes: Pupils are equal, round, and reactive to light. EOM are normal.  Neck: Normal range of motion. Neck supple. No JVD present. No thyromegaly present.  Cardiovascular: Normal rate and regular rhythm.  No murmur  heard. Pulmonary/Chest: Effort normal. She has no wheezes. She has no rales.  Abdominal: Soft. Bowel sounds are normal. She exhibits no distension.  There is tenderness.  Musculoskeletal: She exhibits edema.  1+ edema BLE. Self transfer, self propels w/c to get around.   Neurological: She is alert. No cranial nerve deficit. She exhibits normal muscle tone. Coordination normal.  Oriented to person and place.   Skin: Skin is warm and dry.  Dry skin, some scratched marks on limbs.   Psychiatric: She has a normal mood and affect. Her behavior is normal.    Labs reviewed: Recent Labs    06/24/17 07/13/17 09/21/17  NA 139 141 143  K 4.2 3.6 3.5  CL  --   --  111  CO2  --   --  28  BUN 16 17 15   CREATININE 0.8 0.8 0.8  CALCIUM  --   --  9.3   Recent Labs    03/09/17 06/24/17  AST 25 25  ALT 23  --   ALKPHOS 61 47   Recent Labs    03/09/17 07/13/17  WBC 8.6 7.4  HGB 15.0 12.4  HCT 43 36  PLT 392 385   Lab Results  Component Value Date   TSH 1.90 06/24/2017   Lab Results  Component Value Date   HGBA1C 4.4 11/14/2015   Lab Results  Component Value Date   CHOL 176 05/15/2014   HDL 62 05/15/2014   LDLCALC 95 05/15/2014   LDLDIRECT 114.3 12/08/2007   TRIG 93 05/15/2014   CHOLHDL 2.9 CALC 12/08/2007    Significant Diagnostic Results in last 30 days:  No results found.  Assessment/Plan  Mild dementia dementia, resides in [facility], w/c for mobility, continu e Donepezil 5mg  for memory.   Essential hypertension Hx of HTN, blood pressure is controlled, continue Amlodipine 5mg  qd, Losartan 100mg  qd.   Insomnia secondary to depression with anxiety Her mood is stable, sleeps well, continue Mirtazapine 7.5mg  qd.   Overactive bladder urianry frequency is managed, continue Myrbetriq 25mg  qd.   Dry skin dermatitis Chronic dry skin, itching scratching, apply moisturizer bid.    Edema 1+, no open wound, continue Furosemide 10mg  qd, will apply compression hosiery daily.      Family/ staff Communication: plan of care reviewed with the patient and charge nurse.   Labs/tests ordered: none  Time spend 25 minutes.

## 2017-10-08 NOTE — Assessment & Plan Note (Signed)
Chronic dry skin, itching scratching, apply moisturizer bid.

## 2017-10-08 NOTE — Assessment & Plan Note (Signed)
1+, no open wound, continue Furosemide 10mg  qd, will apply compression hosiery daily.

## 2017-10-11 ENCOUNTER — Encounter: Payer: Self-pay | Admitting: Nurse Practitioner

## 2017-10-11 DIAGNOSIS — R2681 Unsteadiness on feet: Secondary | ICD-10-CM | POA: Diagnosis not present

## 2017-10-11 DIAGNOSIS — L853 Xerosis cutis: Secondary | ICD-10-CM | POA: Diagnosis not present

## 2017-10-11 DIAGNOSIS — R278 Other lack of coordination: Secondary | ICD-10-CM | POA: Diagnosis not present

## 2017-10-11 DIAGNOSIS — M6281 Muscle weakness (generalized): Secondary | ICD-10-CM | POA: Diagnosis not present

## 2017-10-15 DIAGNOSIS — R278 Other lack of coordination: Secondary | ICD-10-CM | POA: Diagnosis not present

## 2017-10-15 DIAGNOSIS — R2681 Unsteadiness on feet: Secondary | ICD-10-CM | POA: Diagnosis not present

## 2017-10-15 DIAGNOSIS — M6281 Muscle weakness (generalized): Secondary | ICD-10-CM | POA: Diagnosis not present

## 2017-10-15 DIAGNOSIS — L853 Xerosis cutis: Secondary | ICD-10-CM | POA: Diagnosis not present

## 2017-10-18 ENCOUNTER — Telehealth: Payer: Self-pay

## 2017-10-18 NOTE — Telephone Encounter (Signed)
Incoming fax received from [facility] indicating that they are noting an allergy to loop diuretics on patient's profile. Please advise if ok to dispense furosemide or change therapy   [facility] also indicated that this is their 3rd attempt to resolve this concern. I am covering clinical intake today and this is my first time seeing request, not sure if it was faxed last week for I was not in this position.   Fax forwarded to [facility], Attention: Elijah Birk

## 2017-10-20 NOTE — Telephone Encounter (Signed)
Spoke with [facility] regarding patient's allergy to loop diuretics, informed them that she is getting mediction from another pharmacy at this time and they are aware.

## 2017-11-08 ENCOUNTER — Encounter: Payer: Self-pay | Admitting: Internal Medicine

## 2017-11-08 ENCOUNTER — Non-Acute Institutional Stay (SKILLED_NURSING_FACILITY): Payer: Medicare Other | Admitting: Internal Medicine

## 2017-11-08 DIAGNOSIS — J410 Simple chronic bronchitis: Secondary | ICD-10-CM

## 2017-11-08 DIAGNOSIS — F03A0 Unspecified dementia, mild, without behavioral disturbance, psychotic disturbance, mood disturbance, and anxiety: Secondary | ICD-10-CM

## 2017-11-08 DIAGNOSIS — K219 Gastro-esophageal reflux disease without esophagitis: Secondary | ICD-10-CM | POA: Diagnosis not present

## 2017-11-08 DIAGNOSIS — F0390 Unspecified dementia without behavioral disturbance: Secondary | ICD-10-CM

## 2017-11-08 DIAGNOSIS — I10 Essential (primary) hypertension: Secondary | ICD-10-CM | POA: Diagnosis not present

## 2017-11-08 DIAGNOSIS — I739 Peripheral vascular disease, unspecified: Secondary | ICD-10-CM | POA: Diagnosis not present

## 2017-11-08 NOTE — Progress Notes (Signed)
Location:  [facility] Room Number: 3 Place of Service:  SNF [phone]) Provider:  Blanchie Serve MD  Blanchie Serve, MD  Patient Care Team: Blanchie Serve, MD as PCP - General (Internal Medicine) [name], [name], NP as Nurse Practitioner (Nurse Practitioner)  Extended Emergency Contact Information Primary Emergency Contact: [name] Address: [place], [place] [address] [name] of [place] Phone: [phone] Work Phone: [phone] Mobile Phone: [phone] Relation: Daughter Secondary Emergency Contact: [name] Address: [address] [place] [address] [name] of [place] Phone: [phone] Mobile Phone: [phone] Relation: Son  Code Status:  DNR  Goals of care: Advanced Directive information Advanced Directives 11/08/2017  Does Patient Have a Medical Advance Directive? Yes  Type of Paramedic of [place];Living will;Out of facility DNR (pink MOST or yellow form)  Does patient want to make changes to medical advance directive? No - Patient declined  Copy of [place] in Chart? Yes  Pre-existing out of facility DNR order (yellow form or pink MOST form) Yellow form placed in chart (order not valid for inpatient use)     Chief Complaint  Patient presents with  . Medical Management of Chronic Issues    Routine Visit     HPI:  Pt is a 84 y.o. female seen today for medical management of chronic diseases. She has been at her baseline per nursing. She has dementia and takes donepezil per nursing. She ambulates on her wheelchair. No fall reported. No acute behavior changes reported. Stable BP reading on review. Tolerating antihypertensive well. Appetite fair and tolerating low dose remeron well. Continues to have swelling to her legs. Denies leg pain. Complaints of occasional discomfort/ pain to lower back area.    Past Medical History:    Diagnosis Date  . Abnormality of gait 04/28/2010   History of fall onto right shoulder-has participated in PT. Uses walker.     . Acute encephalopathy 10/25/14   Occurred when septic  . Acute lower GI bleeding 10/30/2014  . Adjustment disorder with mixed anxiety and depressed mood 06/04/2015   11/15/15 Hgb 12.8, Na 139, K 4.2, Bun 14, creat 0.68, TP 5.7, albumin 3.5, TSH 1.54, Hgb a1c 4.4   . Anemia   . Aortic atherosclerosis ([place]) 10/27/2014  . Asthma   . Cerebral atrophy 10/27/2014  . Cerebrovascular disease 10/27/2014   Small vessel disease  . Cervical disc disease   . Chronic lower back pain 06/04/2015   lumbar spondylosis. Regular f/u Dr. French Ana. Receives injections last 01/2013.  05/27/15 Xray left hip/pelvis: spondylotic changes, severe osteoarthritis Continue Tylenol and Tramadol prn for pain, observe the patient, adding Cymbalta may help. Mid to lower back pain more on the right side back, may consider X-ray of thoracic spine. 06/01/15 CT thoracic spine showed no acute changes.  Patient received 10 injection in the back through Dr. Alvester Morin office. She says the previous problems of discomfort in the back and down the leg have completely resolved. Takes Aleve 220mg  bid   . Debility 06/20/2015   11/15/15 Hgb 12.8, Na 139, K 4.2, Bun 14, creat 0.68, TP 5.7, albumin 3.5, TSH 1.54, Hgb a1c 4.4   . Degenerative disc disease, lumbar 10/27/2014  . Depression   . Dermatitis 03/25/2016  . Diarrhea 10/25/14  . Diverticulosis 10/27/2014  . Essential hypertension 09/20/2006   11/15/15 Hgb 12.8,  Na 139, K 4.2, Bun 14, creat 0.68, TP 5.7, albumin 3.5, TSH 1.54, Hgb a1c 4.4   . Fall at nursing home 10/25/2014  . GERD (gastroesophageal reflux disease)   . History of cardiovascular disorder 09/20/2006   X 3 in 2000 now on aspirin.    Marland Kitchen Hyperlipidemia 09/20/2006   05/15/14 LDL 95   . Hypertension   . Lumbosacral spondylosis 10/28/2009  . OSTEOPOROSIS 09/20/2006   Noted 2008    . Overactive bladder 11/29/2013    Myrbetriq prescribed by urology.    . Protein calorie malnutrition ([place])    06/06/15 TP 6.0 albumin 2.9  . SCOLIOSIS 10/28/2009  . Scoliosis 10/27/2014  . Thrombocytosis ([place]) 05/31/2015   06/10/15 plt 602 07/23/15 plt 335 11/15/15 Hgb 12.8, Na 139, K 4.2, Bun 14, creat 0.68, TP 5.7, albumin 3.5, TSH 1.54, Hgb a1c 4.4  . TIA (transient ischemic attack) 09/20/2006   Past Surgical History:  Procedure Laterality Date  . ABDOMINAL HYSTERECTOMY    . APPENDECTOMY    . CHOLECYSTECTOMY    . TONSILLECTOMY      Allergies  Allergen Reactions  . Tramadol Other (See Comments)    Hallucinations, agitation/combativeness  . Amoxicillin     Per MAR  . Oxycodone Other (See Comments)    Per MAR   . Sulfamethoxazole     Per MAR   . Penicillins Rash    Outpatient Encounter Medications as of 11/08/2017  Medication Sig  . acetaminophen (TYLENOL) 325 MG tablet Take 650 mg by mouth every 6 (six) hours as needed.  Marland Kitchen albuterol (PROVENTIL HFA;VENTOLIN HFA) 108 (90 BASE) MCG/ACT inhaler Inhale 2 puffs into the lungs every 6 (six) hours as needed for wheezing or shortness of breath.  Marland Kitchen alendronate (FOSAMAX) 70 MG tablet Take 70 mg by mouth once a week. Take with a full glass of water on an empty stomach.  Marland Kitchen alum & mag hydroxide-simeth (MAALOX/MYLANTA) 200-200-20 MG/5ML suspension Take 30 mLs by mouth every 4 (four) hours as needed for indigestion or heartburn.   Marland Kitchen amLODipine (NORVASC) 5 MG tablet Take 5 mg by mouth daily with breakfast.   . calcium carbonate (OSCAL) 1500 (600 Ca) MG TABS tablet Take 600 mg of elemental calcium by mouth 2 (two) times daily with a meal.  . camphor-menthol (SARNA) lotion Apply 1 application topically daily. To arms and legs. Can also use as needed.  . cetaphil (CETAPHIL) cream Apply 1 application topically daily.  . clobetasol cream (TEMOVATE) 1.06 % Apply 1 application topically 2 (two) times daily as needed.  . donepezil (ARICEPT) 5 MG tablet Take 5 mg by mouth at bedtime.  .  famotidine (PEPCID) 20 MG tablet Take 10 mg by mouth at bedtime.  . furosemide (LASIX) 20 MG tablet Take 0.5 tablets (10 mg total) by mouth daily.  Marland Kitchen loperamide (IMODIUM) 2 MG capsule Take 2 mg by mouth as needed for diarrhea or loose stools. Max 16 mg/24 hours call MD if Diarrhea continues  . losartan (COZAAR) 100 MG tablet Take 100 mg by mouth daily with breakfast.   . Mirabegron ER (MYRBETRIQ) 25 MG TB24 Take 25 mg by mouth daily with breakfast. Prescribed by urology  . mirtazapine (REMERON) 7.5 MG tablet Take 7.5 mg by mouth at bedtime.  . nitroGLYCERIN (NITROSTAT) 0.4 MG SL tablet Place 0.4 mg under the tongue every 5 (five) minutes as needed for chest pain. After taking three times and no relief call MD.  . polyethylene glycol (MIRALAX / GLYCOLAX) packet Take  17 g by mouth daily as needed for moderate constipation.   Marland Kitchen pyridOXINE (VITAMIN B-6) 100 MG tablet Take 100 mg by mouth daily with breakfast.   . [DISCONTINUED] Cholecalciferol (VITAMIN D3) 50000 units TABS Take 50,000 Units by mouth once a week. Stop date 09/21/17   No facility-administered encounter medications on file as of 11/08/2017.     Review of Systems  Reason unable to perform ROS: limited with dementia.  Constitutional: Negative for appetite change and fever.  HENT: Positive for hearing loss.   Eyes: Positive for visual disturbance.  Respiratory: Negative for cough and shortness of breath.   Cardiovascular: Positive for leg swelling. Negative for chest pain and palpitations.  Gastrointestinal: Negative for abdominal pain, diarrhea, nausea and vomiting.  Genitourinary: Negative for dysuria.  Musculoskeletal: Positive for back pain and gait problem.  Neurological: Negative for dizziness and headaches.  Psychiatric/Behavioral: Positive for confusion.    Immunization History  Administered Date(s) Administered  . HiB (PRP-OMP) 02/12/2004  . Influenza Split 01/11/2012  . Influenza Whole 01/07/2009  .  Influenza-Unspecified 01/11/2014, 01/01/2015, 01/29/2016, 02/01/2017  . PPD Test 11/14/2014, 06/03/2015, 06/17/2015  . Pneumococcal Conjugate-13 02/08/2017  . Pneumococcal Polysaccharide-23 04/28/2010  . Tdap 08/24/2011  . Zoster 04/13/2010   Pertinent  Health Maintenance Due  Topic Date Due  . INFLUENZA VACCINE  11/11/2017  . DEXA SCAN  Completed  . PNA vac Low Risk Adult  Completed   Fall Risk  12/29/2016 11/02/2016 06/20/2015 05/23/2015 11/09/2014  Falls in the past year? No Yes No No No  Comment - [facility] Telephone Survey: data to providers prior to load - - -  Number falls in past yr: - 2 or more - - -  Comment - [facility] Telephone Survey Actual Response = 90 - - -  Injury with Fall? - Yes - - -   Functional Status Survey:    Vitals:   11/08/17 1159  BP: 120/60  Pulse: 68  Resp: 18  Temp: 98.3 F (36.8 C)  TempSrc: Oral  SpO2: 97%  Weight: 157 lb 11.2 oz (71.5 kg)  Height: 5\' 5"  (1.651 m)   Body mass index is 26.24 kg/m.   Wt Readings from Last 3 Encounters:  11/08/17 157 lb 11.2 oz (71.5 kg)  09/13/17 159 lb 6.4 oz (72.3 kg)  09/09/17 160 lb (72.6 kg)   Physical Exam  Constitutional: She appears well-developed and well-nourished. No distress.  HENT:  Head: Normocephalic and atraumatic.  Mouth/Throat: Oropharynx is clear and moist.  Has hearing aid  Eyes: Pupils are equal, round, and reactive to light. Conjunctivae and EOM are normal. Right eye exhibits no discharge. Left eye exhibits no discharge.  Has corrective glasses  Neck: Normal range of motion. Neck supple.  Cardiovascular: Normal rate and regular rhythm.  Pulmonary/Chest: Effort normal and breath sounds normal. She has no wheezes. She has no rales.  Abdominal: Soft. Bowel sounds are normal. There is no tenderness. There is no guarding.  Musculoskeletal: She exhibits edema.  2+ pitting edema to legs, no spinal tenderness, on wheelchair for mobility, needs assistance with transfer, self propels  Lymphadenopathy:     She has no cervical adenopathy.  Neurological: She is alert.  Oriented to self  Skin: Skin is warm and dry. She is not diaphoretic.    Labs reviewed: Recent Labs    06/24/17 07/13/17 09/21/17  NA 139 141 143  K 4.2 3.6 3.5  CL  --   --  111  CO2  --   --  28  BUN 16 17 15   CREATININE 0.8 0.8 0.8  CALCIUM  --   --  9.3   Recent Labs    03/09/17 06/24/17  AST 25 25  ALT 23  --   ALKPHOS 61 47   Recent Labs    03/09/17 07/13/17  WBC 8.6 7.4  HGB 15.0 12.4  HCT 43 36  PLT 392 385   Lab Results  Component Value Date   TSH 1.90 06/24/2017   Lab Results  Component Value Date   HGBA1C 4.4 11/14/2015   Lab Results  Component Value Date   CHOL 176 05/15/2014   HDL 62 05/15/2014   LDLCALC 95 05/15/2014   LDLDIRECT 114.3 12/08/2007   TRIG 93 05/15/2014   CHOLHDL 2.9 CALC 12/08/2007    Significant Diagnostic Results in last 30 days:  No results found.  Assessment/Plan  1. PVD (peripheral vascular disease) (HCC) Decrease amlodipine to 2.5 mg daily for now. Monitor BP. Increase lasix to 20 mg daily. Add kcl 10 meq daily. Check bmp in 2 weeks. Monitor weight every Tuesday. Ted hose to both legs. If BP remains stable, d/c amlodipine  2. Simple chronic bronchitis (HCC) Continue albuterol as needed and monitor  3. Gastroesophageal reflux disease without esophagitis Continue famotidine and monitor  4. Mild dementia Appetite is fair, weight stable. D/c remeron. Continue aricept  5. Hypertension Decrease amlodipine to 2.5 mg daily and continue losartan, monitor BP and BMP  Family/ staff Communication: reviewed care plan with patient and charge nurse.    Labs/tests ordered:  Bmp, mg in 2 weeks   Blanchie Serve, MD Internal Medicine [facility] [place], [place] [address] Cell Phone (Monday-Friday 8 am - 5 pm): [phone] On Call: [phone] and follow prompts after 5 pm and on weekends Office Phone:  [phone] Office Fax: [phone]

## 2017-11-23 DIAGNOSIS — I731 Thromboangiitis obliterans [Buerger's disease]: Secondary | ICD-10-CM | POA: Diagnosis not present

## 2017-11-23 DIAGNOSIS — I10 Essential (primary) hypertension: Secondary | ICD-10-CM | POA: Diagnosis not present

## 2017-11-23 LAB — HEPATIC FUNCTION PANEL
ALK PHOS: 50 (ref 25–125)
ALT: 15 (ref 7–35)
AST: 17 (ref 13–35)
BILIRUBIN, TOTAL: 0.6

## 2017-11-23 LAB — CBC AND DIFFERENTIAL
HEMATOCRIT: 37 (ref 36–46)
Hemoglobin: 12.7 (ref 12.0–16.0)
PLATELETS: 347 (ref 150–399)
WBC: 7.3

## 2017-11-23 LAB — BASIC METABOLIC PANEL
BUN: 16 (ref 4–21)
CREATININE: 0.8 (ref <=1.1)
Glucose: 99
Potassium: 3.9 (ref 3.4–5.3)
Sodium: 141 (ref 137–147)

## 2017-11-24 ENCOUNTER — Encounter: Payer: Self-pay | Admitting: Internal Medicine

## 2017-11-24 ENCOUNTER — Other Ambulatory Visit: Payer: Self-pay | Admitting: *Deleted

## 2017-11-24 LAB — COMPLETE METABOLIC PANEL WITH GFR
Albumin: 3.2
CALCIUM: 9.4
CHLORIDE: 108
Carbon Dioxide, Total: 27
EGFR (Non-African Amer.): 63
Globulin: 2.2
MAGNESIUM: 1.9
TOTAL PROTEIN: 5.4 g/dL

## 2017-12-06 ENCOUNTER — Encounter: Payer: Self-pay | Admitting: Nurse Practitioner

## 2017-12-06 ENCOUNTER — Non-Acute Institutional Stay (SKILLED_NURSING_FACILITY): Payer: Medicare Other | Admitting: Nurse Practitioner

## 2017-12-06 DIAGNOSIS — G8929 Other chronic pain: Secondary | ICD-10-CM

## 2017-12-06 DIAGNOSIS — I10 Essential (primary) hypertension: Secondary | ICD-10-CM

## 2017-12-06 DIAGNOSIS — K219 Gastro-esophageal reflux disease without esophagitis: Secondary | ICD-10-CM

## 2017-12-06 DIAGNOSIS — F0390 Unspecified dementia without behavioral disturbance: Secondary | ICD-10-CM

## 2017-12-06 DIAGNOSIS — M545 Low back pain: Secondary | ICD-10-CM

## 2017-12-06 DIAGNOSIS — F03A0 Unspecified dementia, mild, without behavioral disturbance, psychotic disturbance, mood disturbance, and anxiety: Secondary | ICD-10-CM

## 2017-12-06 DIAGNOSIS — I739 Peripheral vascular disease, unspecified: Secondary | ICD-10-CM | POA: Diagnosis not present

## 2017-12-06 DIAGNOSIS — N3281 Overactive bladder: Secondary | ICD-10-CM | POA: Diagnosis not present

## 2017-12-06 NOTE — Assessment & Plan Note (Signed)
Stable, continue Famotidine 10mg qd.  

## 2017-12-06 NOTE — Assessment & Plan Note (Signed)
Functioning adequate in [facility], continue close supervision for safety

## 2017-12-06 NOTE — Assessment & Plan Note (Signed)
Dry scaly skin changes noted, no open wound except a few scratched injuries. Will continue Furosemide 10mg  qd, apply Sarna lotion to BLE qhs. Observe.

## 2017-12-06 NOTE — Progress Notes (Signed)
Location:  [facility] Room Number: 3 Place of Service:  SNF (31) Provider:  [name], ManXie  NP  Blanchie Serve, MD  Patient Care Team: Blanchie Serve, MD as PCP - General (Internal Medicine) [name], [name], NP as Nurse Practitioner (Nurse Practitioner)  Extended Emergency Contact Information Primary Emergency Contact: [name] Address: [place], [place] [address] [name] of [place] Phone: [phone] Work Phone: [phone] Mobile Phone: [phone] Relation: Daughter Secondary Emergency Contact: [name] Address: [address] [place] [address] [name] of [place] Phone: [phone] Mobile Phone: [phone] Relation: Son  Code Status:  DNR Goals of care: Advanced Directive information Advanced Directives 12/06/2017  Does Patient Have a Medical Advance Directive? Yes  Type of Paramedic of [place];Living will;Out of facility DNR (pink MOST or yellow form)  Does patient want to make changes to medical advance directive? No - Patient declined  Copy of [place] in Chart? Yes  Pre-existing out of facility DNR order (yellow form or pink MOST form) Yellow form placed in chart (order not valid for inpatient use)     Chief Complaint  Patient presents with  . Medical Management of Chronic Issues    F/u- PVD, HTN, dementia, GERD,     HPI:  Pt is a 84 y.o. female seen today for medical management of chronic diseases.     The patient PVD, edema in legs is minimal, chronic scaly skin changes BLE noted, the patient stated it itches at times, noted a few scratched marks, on Furosemide 10mg  qd. Her urinary frequency is managed with Mirabegron 25mg  qd. Hx of HTN, blood pressure is controlled on Amlodipine 2.5mg  qd, Losartan 100mg  qd. No constipation while on MiraLax daily prn. Stable GERD on Famotidine 10mg  qhs. Chronic lower back pain,  managed with prn Tylenol 650mg  q6h and wheelchair.   Past Medical History:  Diagnosis Date  . Abnormality of gait 04/28/2010   History of fall onto right shoulder-has participated in PT. Uses walker.     . Acute encephalopathy 10/25/14   Occurred when septic  . Acute lower GI bleeding 10/30/2014  . Adjustment disorder with mixed anxiety and depressed mood 06/04/2015   11/15/15 Hgb 12.8, Na 139, K 4.2, Bun 14, creat 0.68, TP 5.7, albumin 3.5, TSH 1.54, Hgb a1c 4.4   . Anemia   . Aortic atherosclerosis ([place]) 10/27/2014  . Asthma   . Cerebral atrophy 10/27/2014  . Cerebrovascular disease 10/27/2014   Small vessel disease  . Cervical disc disease   . Chronic lower back pain 06/04/2015   lumbar spondylosis. Regular f/u Dr. French Ana. Receives injections last 01/2013.  05/27/15 Xray left hip/pelvis: spondylotic changes, severe osteoarthritis Continue Tylenol and Tramadol prn for pain, observe the patient, adding Cymbalta may help. Mid to lower back pain more on the right side back, may consider X-ray of thoracic spine. 06/01/15 CT thoracic spine showed no acute changes.  Patient received 10 injection in the back through Dr. Alvester Morin office. She says the previous problems of discomfort in the back and down the leg have completely resolved. Takes Aleve 220mg  bid   . Debility 06/20/2015   11/15/15 Hgb 12.8, Na 139, K 4.2, Bun 14, creat 0.68, TP 5.7, albumin 3.5, TSH 1.54, Hgb a1c 4.4   . Degenerative disc disease, lumbar 10/27/2014  . Depression   . Dermatitis  03/25/2016  . Diarrhea 10/25/14  . Diverticulosis 10/27/2014  . Essential hypertension 09/20/2006   11/15/15 Hgb 12.8, Na 139, K 4.2, Bun 14, creat 0.68, TP 5.7, albumin 3.5, TSH 1.54, Hgb a1c 4.4   . Fall at nursing home 10/25/2014  . GERD (gastroesophageal reflux disease)   . History of cardiovascular disorder 09/20/2006   X 3 in 2000 now on aspirin.    Marland Kitchen Hyperlipidemia 09/20/2006   05/15/14 LDL 95   . Hypertension   . Lumbosacral spondylosis 10/28/2009  .  OSTEOPOROSIS 09/20/2006   Noted 2008    . Overactive bladder 11/29/2013   Myrbetriq prescribed by urology.    . Protein calorie malnutrition ([place])    06/06/15 TP 6.0 albumin 2.9  . SCOLIOSIS 10/28/2009  . Scoliosis 10/27/2014  . Thrombocytosis ([place]) 05/31/2015   06/10/15 plt 602 07/23/15 plt 335 11/15/15 Hgb 12.8, Na 139, K 4.2, Bun 14, creat 0.68, TP 5.7, albumin 3.5, TSH 1.54, Hgb a1c 4.4  . TIA (transient ischemic attack) 09/20/2006   Past Surgical History:  Procedure Laterality Date  . ABDOMINAL HYSTERECTOMY    . APPENDECTOMY    . CHOLECYSTECTOMY    . TONSILLECTOMY      Allergies  Allergen Reactions  . Tramadol Other (See Comments)    Hallucinations, agitation/combativeness  . Amoxicillin     Per MAR  . Oxycodone Other (See Comments)    Per MAR   . Sulfamethoxazole     Per MAR   . Penicillins Rash    Outpatient Encounter Medications as of 12/06/2017  Medication Sig  . acetaminophen (TYLENOL) 325 MG tablet Take 650 mg by mouth every 6 (six) hours as needed.  Marland Kitchen albuterol (PROVENTIL HFA;VENTOLIN HFA) 108 (90 BASE) MCG/ACT inhaler Inhale 2 puffs into the lungs every 6 (six) hours as needed for wheezing or shortness of breath.  Marland Kitchen alendronate (FOSAMAX) 70 MG tablet Take 70 mg by mouth once a week. Take with a full glass of water on an empty stomach.  Marland Kitchen alum & mag hydroxide-simeth (MAALOX/MYLANTA) 200-200-20 MG/5ML suspension Take 30 mLs by mouth every 4 (four) hours as needed for indigestion or heartburn.   Marland Kitchen amLODipine (NORVASC) 2.5 MG tablet Take 2.5 mg by mouth daily. Hold if SBP <110  . calcium carbonate (OSCAL) 1500 (600 Ca) MG TABS tablet Take 600 mg of elemental calcium by mouth 2 (two) times daily with a meal.  . camphor-menthol (SARNA) lotion Apply 1 application topically daily. To arms and legs. Can also use as needed.  . cetaphil (CETAPHIL) cream Apply 1 application topically daily.  . clobetasol cream (TEMOVATE) 5.68 % Apply 1 application topically 2 (two) times daily as  needed.  . donepezil (ARICEPT) 5 MG tablet Take 5 mg by mouth at bedtime.  . famotidine (PEPCID) 20 MG tablet Take 10 mg by mouth at bedtime.  . furosemide (LASIX) 20 MG tablet Take 0.5 tablets (10 mg total) by mouth daily.  Marland Kitchen losartan (COZAAR) 100 MG tablet Take 100 mg by mouth daily with breakfast.   . Mirabegron ER (MYRBETRIQ) 25 MG TB24 Take 25 mg by mouth daily with breakfast. Prescribed by urology  . nitroGLYCERIN (NITROSTAT) 0.4 MG SL tablet Place 0.4 mg under the tongue every 5 (five) minutes as needed for chest pain. After taking three times and no relief call MD.  . polyethylene glycol (MIRALAX / GLYCOLAX) packet Take 17 g by mouth daily as needed for moderate constipation.   . potassium chloride (MICRO-K) 10 MEQ CR capsule Take 10  mEq by mouth daily.  Marland Kitchen pyridOXINE (VITAMIN B-6) 100 MG tablet Take 100 mg by mouth daily with breakfast.   . loperamide (IMODIUM) 2 MG capsule Take 2 mg by mouth as needed for diarrhea or loose stools. Max 16 mg/24 hours call MD if Diarrhea continues  . [DISCONTINUED] amLODipine (NORVASC) 5 MG tablet Take 5 mg by mouth daily with breakfast.   . [DISCONTINUED] mirtazapine (REMERON) 7.5 MG tablet Take 7.5 mg by mouth at bedtime.   No facility-administered encounter medications on file as of 12/06/2017.    ROS was provided with assistance of staff Review of Systems  Constitutional: Negative for activity change, appetite change, chills, diaphoresis, fatigue and fever.  HENT: Positive for hearing loss. Negative for congestion and voice change.   Eyes: Negative for visual disturbance.  Respiratory: Negative for cough, shortness of breath and wheezing.   Cardiovascular: Positive for leg swelling. Negative for chest pain and palpitations.  Gastrointestinal: Negative for abdominal distention, abdominal pain, constipation, diarrhea, nausea and vomiting.  Genitourinary: Negative for difficulty urinating, dysuria and urgency.  Musculoskeletal: Positive for back pain  and gait problem.  Skin: Negative for color change and pallor.       Dry scaly skin noted BLE  Neurological: Negative for dizziness, speech difficulty, weakness and headaches.       Dementia  Psychiatric/Behavioral: Positive for confusion. Negative for agitation, behavioral problems, hallucinations and sleep disturbance. The patient is not nervous/anxious.     Immunization History  Administered Date(s) Administered  . HiB (PRP-OMP) 02/12/2004  . Influenza Split 01/11/2012  . Influenza Whole 01/07/2009  . Influenza-Unspecified 01/11/2014, 01/01/2015, 01/29/2016, 02/01/2017  . PPD Test 11/14/2014, 06/03/2015, 06/17/2015  . Pneumococcal Conjugate-13 02/08/2017  . Pneumococcal Polysaccharide-23 04/28/2010  . Tdap 08/24/2011  . Zoster 04/13/2010   Pertinent  Health Maintenance Due  Topic Date Due  . INFLUENZA VACCINE  11/11/2017  . DEXA SCAN  Completed  . PNA vac Low Risk Adult  Completed   Fall Risk  12/29/2016 11/02/2016 06/20/2015 05/23/2015 11/09/2014  Falls in the past year? No Yes No No No  Comment - Emmi Telephone Survey: data to providers prior to load - - -  Number falls in past yr: - 2 or more - - -  Comment - Emmi Telephone Survey Actual Response = 90 - - -  Injury with Fall? - Yes - - -   Functional Status Survey:    Vitals:   12/06/17 1019  BP: 136/72  Pulse: 77  Resp: 20  Temp: 97.6 F (36.4 C)  SpO2: 97%  Weight: 157 lb 8 oz (71.4 kg)  Height: 5\' 5"  (1.651 m)   Body mass index is 26.21 kg/m. Physical Exam  Constitutional: She appears well-developed and well-nourished.  HENT:  Head: Normocephalic and atraumatic.  Eyes: Pupils are equal, round, and reactive to light. EOM are normal.  Neck: Normal range of motion. Neck supple. No JVD present. No thyromegaly present.  Cardiovascular: Normal rate and regular rhythm.  No murmur heard. Pulmonary/Chest: Effort normal and breath sounds normal. She has no wheezes. She has no rales.  Abdominal: Soft. Bowel sounds are  normal. She exhibits no distension. There is no tenderness.  Musculoskeletal: She exhibits edema and tenderness.  Trace edema BLE. Chronic lower back pain. Assistance with  transfer. W/c for mobility.   Neurological: She is alert. No cranial nerve deficit. She exhibits normal muscle tone. Coordination normal.  Oriented to person and place.   Skin: Skin is warm and dry.  Scaly  dry appearance of BLE, a few scratched injuries noted BLE  Psychiatric: She has a normal mood and affect. Her behavior is normal.    Labs reviewed: Recent Labs    07/13/17 09/21/17 11/23/17  NA 141 143 141  K 3.6 3.5 3.9  CL  --  111 108  CO2  --  28 27  BUN 17 15 16   CREATININE 0.8 0.8 0.8  CALCIUM  --  9.3 9.4  MG  --   --  1.9   Recent Labs    03/09/17 06/24/17 11/23/17  AST 25 25 17   ALT 23  --  15  ALKPHOS 61 47 50  PROT  --   --  5.4  ALBUMIN  --   --  3.2   Recent Labs    03/09/17 07/13/17 11/23/17  WBC 8.6 7.4 7.3  HGB 15.0 12.4 12.7  HCT 43 36 37  PLT 392 385 347   Lab Results  Component Value Date   TSH 1.90 06/24/2017   Lab Results  Component Value Date   HGBA1C 4.4 11/14/2015   Lab Results  Component Value Date   CHOL 176 05/15/2014   HDL 62 05/15/2014   LDLCALC 95 05/15/2014   LDLDIRECT 114.3 12/08/2007   TRIG 93 05/15/2014   CHOLHDL 2.9 CALC 12/08/2007    Significant Diagnostic Results in last 30 days:  No results found.  Assessment/Plan PVD (peripheral vascular disease) (HCC) Dry scaly skin changes noted, no open wound except a few scratched injuries. Will continue Furosemide 10mg  qd, apply Sarna lotion to BLE qhs. Observe.   GERD (gastroesophageal reflux disease) Stable, continue Famotidine 10mg  qd  Mild dementia Functioning adequate in SNF [facility], continue close supervision for safety  Essential hypertension Blood pressure is controlled, continue Amlodipine 2.5mg  qd, Losartan 100mg  qd.   Overactive bladder Stable, continue Mirabegron 25mg  qd.   Chronic  midline low back pain without sciatica Chronic, patient desires continuing Tylenol 650mg  q6h prn, continue w/c for mobility.      Family/ staff Communication: plan of care reviewed with the patient and charge nurse.   Labs/tests ordered:  none  Time spend 25 minutes.

## 2017-12-06 NOTE — Assessment & Plan Note (Signed)
Stable, continue Mirabegron 25mg qd.  

## 2017-12-06 NOTE — Assessment & Plan Note (Signed)
Chronic, patient desires continuing Tylenol 650mg  q6h prn, continue w/c for mobility.

## 2017-12-06 NOTE — Assessment & Plan Note (Signed)
Blood pressure is controlled, continue Amlodipine 2.5mg qd, Losartan 100mg qd.  

## 2018-01-04 ENCOUNTER — Non-Acute Institutional Stay (SKILLED_NURSING_FACILITY): Payer: Medicare Other

## 2018-01-04 DIAGNOSIS — Z0000 Encounter for general adult medical examination without abnormal findings: Secondary | ICD-10-CM | POA: Diagnosis not present

## 2018-01-04 NOTE — Patient Instructions (Signed)
Brandy Crawford , Thank you for taking time to come for your Medicare Wellness Visit. I appreciate your ongoing commitment to your health goals. Please review the following plan we discussed and let me know if I can assist you in the future.   Screening recommendations/referrals: Colonoscopy excluded, over age 82 Mammogram excluded, over age 82 Bone Density up to date Recommended yearly ophthalmology/optometry visit for glaucoma screening and checkup Recommended yearly dental visit for hygiene and checkup  Vaccinations: Influenza vaccine due, will receive at [facility] Pneumococcal vaccine up to date, completed Tdap vaccine up to date, due 08/23/2021 Shingles vaccine not in past records    Advanced directives:  In chart  Conditions/risks identified: none  Next appointment: Dr. Bubba Camp makes rounds   Preventive Care 81 Years and Older, Female Preventive care refers to lifestyle choices and visits with your health care provider that can promote health and wellness. What does preventive care include?  A yearly physical exam. This is also called an annual well check.  Dental exams once or twice a year.  Routine eye exams. Ask your health care provider how often you should have your eyes checked.  Personal lifestyle choices, including:  Daily care of your teeth and gums.  Regular physical activity.  Eating a healthy diet.  Avoiding tobacco and drug use.  Limiting alcohol use.  Practicing safe sex.  Taking low-dose aspirin every day.  Taking vitamin and mineral supplements as recommended by your health care provider. What happens during an annual well check? The services and screenings done by your health care provider during your annual well check will depend on your age, overall health, lifestyle risk factors, and family history of disease. Counseling  Your health care provider may ask you questions about your:  Alcohol use.  Tobacco use.  Drug use.  Emotional  well-being.  Home and relationship well-being.  Sexual activity.  Eating habits.  History of falls.  Memory and ability to understand (cognition).  Work and work Statistician.  Reproductive health. Screening  You may have the following tests or measurements:  Height, weight, and BMI.  Blood pressure.  Lipid and cholesterol levels. These may be checked every 5 years, or more frequently if you are over 59 years old.  Skin check.  Lung cancer screening. You may have this screening every year starting at age 82 if you have a 30-pack-year history of smoking and currently smoke or have quit within the past 15 years.  Fecal occult blood test (FOBT) of the stool. You may have this test every year starting at age 82.  Flexible sigmoidoscopy or colonoscopy. You may have a sigmoidoscopy every 5 years or a colonoscopy every 10 years starting at age 82.  Hepatitis C blood test.  Hepatitis B blood test.  Sexually transmitted disease (STD) testing.  Diabetes screening. This is done by checking your blood sugar (glucose) after you have not eaten for a while (fasting). You may have this done every 1-3 years.  Bone density scan. This is done to screen for osteoporosis. You may have this done starting at age 82.  Mammogram. This may be done every 1-2 years. Talk to your health care provider about how often you should have regular mammograms. Talk with your health care provider about your test results, treatment options, and if necessary, the need for more tests. Vaccines  Your health care provider may recommend certain vaccines, such as:  Influenza vaccine. This is recommended every year.  Tetanus, diphtheria, and acellular pertussis (Tdap, Td)  vaccine. You may need a Td booster every 10 years.  Zoster vaccine. You may need this after age 42.  Pneumococcal 13-valent conjugate (PCV13) vaccine. One dose is recommended after age 82.  Pneumococcal polysaccharide (PPSV23) vaccine. One  dose is recommended after age 82. Talk to your health care provider about which screenings and vaccines you need and how often you need them. This information is not intended to replace advice given to you by your health care provider. Make sure you discuss any questions you have with your health care provider. Document Released: 04/26/2015 Document Revised: 12/18/2015 Document Reviewed: 01/29/2015 Elsevier Interactive Patient Education  2017 [place] Prevention in the Home Falls can cause injuries. They can happen to people of all ages. There are many things you can do to make your home safe and to help prevent falls. What can I do on the outside of my home?  Regularly fix the edges of walkways and driveways and fix any cracks.  Remove anything that might make you trip as you walk through a door, such as a raised step or threshold.  Trim any bushes or trees on the path to your home.  Use bright outdoor lighting.  Clear any walking paths of anything that might make someone trip, such as rocks or tools.  Regularly check to see if handrails are loose or broken. Make sure that both sides of any steps have handrails.  Any raised decks and porches should have guardrails on the edges.  Have any leaves, snow, or ice cleared regularly.  Use sand or salt on walking paths during winter.  Clean up any spills in your garage right away. This includes oil or grease spills. What can I do in the bathroom?  Use night lights.  Install grab bars by the toilet and in the tub and shower. Do not use towel bars as grab bars.  Use non-skid mats or decals in the tub or shower.  If you need to sit down in the shower, use a plastic, non-slip stool.  Keep the floor dry. Clean up any water that spills on the floor as soon as it happens.  Remove soap buildup in the tub or shower regularly.  Attach bath mats securely with double-sided non-slip rug tape.  Do not have throw rugs and other  things on the floor that can make you trip. What can I do in the bedroom?  Use night lights.  Make sure that you have a light by your bed that is easy to reach.  Do not use any sheets or blankets that are too big for your bed. They should not hang down onto the floor.  Have a firm chair that has side arms. You can use this for support while you get dressed.  Do not have throw rugs and other things on the floor that can make you trip. What can I do in the kitchen?  Clean up any spills right away.  Avoid walking on wet floors.  Keep items that you use a lot in easy-to-reach places.  If you need to reach something above you, use a strong step stool that has a grab bar.  Keep electrical cords out of the way.  Do not use floor polish or wax that makes floors slippery. If you must use wax, use non-skid floor wax.  Do not have throw rugs and other things on the floor that can make you trip. What can I do with my stairs?  Do not leave  any items on the stairs.  Make sure that there are handrails on both sides of the stairs and use them. Fix handrails that are broken or loose. Make sure that handrails are as long as the stairways.  Check any carpeting to make sure that it is firmly attached to the stairs. Fix any carpet that is loose or worn.  Avoid having throw rugs at the top or bottom of the stairs. If you do have throw rugs, attach them to the floor with carpet tape.  Make sure that you have a light switch at the top of the stairs and the bottom of the stairs. If you do not have them, ask someone to add them for you. What else can I do to help prevent falls?  Wear shoes that:  Do not have high heels.  Have rubber bottoms.  Are comfortable and fit you well.  Are closed at the toe. Do not wear sandals.  If you use a stepladder:  Make sure that it is fully opened. Do not climb a closed stepladder.  Make sure that both sides of the stepladder are locked into place.  Ask  someone to hold it for you, if possible.  Clearly mark and make sure that you can see:  Any grab bars or handrails.  First and last steps.  Where the edge of each step is.  Use tools that help you move around (mobility aids) if they are needed. These include:  Canes.  Walkers.  Scooters.  Crutches.  Turn on the lights when you go into a dark area. Replace any light bulbs as soon as they burn out.  Set up your furniture so you have a clear path. Avoid moving your furniture around.  If any of your floors are uneven, fix them.  If there are any pets around you, be aware of where they are.  Review your medicines with your doctor. Some medicines can make you feel dizzy. This can increase your chance of falling. Ask your doctor what other things that you can do to help prevent falls. This information is not intended to replace advice given to you by your health care provider. Make sure you discuss any questions you have with your health care provider. Document Released: 01/24/2009 Document Revised: 09/05/2015 Document Reviewed: 05/04/2014 Elsevier Interactive Patient Education  2017 [employer].

## 2018-01-04 NOTE — Progress Notes (Signed)
Subjective:   Brandy Crawford is a 84 y.o. female who presents for Medicare Annual (Subsequent) preventive examination at [facility]    Objective:     Vitals: BP 126/65 (BP Location: Left Arm, Patient Position: Sitting)   Pulse 78   Temp 97.9 F (36.6 C) (Oral)   Ht 5\' 5"  (1.651 m)   Wt 157 lb (71.2 kg)   BMI 26.13 kg/m   Body mass index is 26.13 kg/m.  Advanced Directives 01/04/2018 12/06/2017 11/08/2017 09/13/2017 09/09/2017 07/14/2017 07/09/2017  Does Patient Have a Medical Advance Directive? Yes Yes Yes Yes Yes Yes Yes  Type of Paramedic of [place];Living will;Out of facility DNR (pink MOST or yellow form) [place];Living will;Out of facility DNR (pink MOST or yellow form) [place];Living will;Out of facility DNR (pink MOST or yellow form) [place];Living will;Out of facility DNR (pink MOST or yellow form) [place];Living will;Out of facility DNR (pink MOST or yellow form) [place];Living will;Out of facility DNR (pink MOST or yellow form) [place];Living will  Does patient want to make changes to medical advance directive? No - Patient declined No - Patient declined No - Patient declined No - Patient declined No - Patient declined No - Patient declined No - Patient declined  Copy of [place] in Chart? Yes Yes Yes Yes Yes Yes Yes  Pre-existing out of facility DNR order (yellow form or pink MOST form) Yellow form placed in chart (order not valid for inpatient use) Yellow form placed in chart (order not valid for inpatient use) Yellow form placed in chart (order not valid for inpatient use) Yellow form placed in chart (order not valid for inpatient use) Yellow form placed in chart (order not valid for inpatient use) Yellow form placed in chart (order not valid for inpatient use) Yellow form placed in chart  (order not valid for inpatient use)    Tobacco Social History   Tobacco Use  Smoking Status Never Smoker  Smokeless Tobacco Never Used     Counseling given: Not Answered   Clinical Intake:  Pre-visit preparation completed: No  Pain : No/denies pain     Diabetes: No  What is the last grade level you completed in school?: [place]?: No  Information entered by :: Tyson Dense, RN  Past Medical History:  Diagnosis Date  . Abnormality of gait 04/28/2010   History of fall onto right shoulder-has participated in PT. Uses walker.     . Acute encephalopathy 10/25/14   Occurred when septic  . Acute lower GI bleeding 10/30/2014  . Adjustment disorder with mixed anxiety and depressed mood 06/04/2015   11/15/15 Hgb 12.8, Na 139, K 4.2, Bun 14, creat 0.68, TP 5.7, albumin 3.5, TSH 1.54, Hgb a1c 4.4   . Anemia   . Aortic atherosclerosis ([place]) 10/27/2014  . Asthma   . Cerebral atrophy 10/27/2014  . Cerebrovascular disease 10/27/2014   Small vessel disease  . Cervical disc disease   . Chronic lower back pain 06/04/2015   lumbar spondylosis. Regular f/u Dr. French Ana. Receives injections last 01/2013.  05/27/15 Xray left hip/pelvis: spondylotic changes, severe osteoarthritis Continue Tylenol and Tramadol prn for pain, observe the patient, adding Cymbalta may help. Mid to lower back pain more on the right side back, may consider X-ray of thoracic spine. 06/01/15 CT thoracic spine showed no acute changes.  Patient received  10 injection in the back through Dr. Alvester Morin office. She says the previous problems of discomfort in the back and down the leg have completely resolved. Takes Aleve 220mg  bid   . Debility 06/20/2015   11/15/15 Hgb 12.8, Na 139, K 4.2, Bun 14, creat 0.68, TP 5.7, albumin 3.5, TSH 1.54, Hgb a1c 4.4   . Degenerative disc disease, lumbar 10/27/2014  . Depression   . Dermatitis 03/25/2016  . Diarrhea 10/25/14  . Diverticulosis 10/27/2014  . Essential  hypertension 09/20/2006   11/15/15 Hgb 12.8, Na 139, K 4.2, Bun 14, creat 0.68, TP 5.7, albumin 3.5, TSH 1.54, Hgb a1c 4.4   . Fall at nursing home 10/25/2014  . GERD (gastroesophageal reflux disease)   . History of cardiovascular disorder 09/20/2006   X 3 in 2000 now on aspirin.    Marland Kitchen Hyperlipidemia 09/20/2006   05/15/14 LDL 95   . Hypertension   . Lumbosacral spondylosis 10/28/2009  . OSTEOPOROSIS 09/20/2006   Noted 2008    . Overactive bladder 11/29/2013   Myrbetriq prescribed by urology.    . Protein calorie malnutrition ([place])    06/06/15 TP 6.0 albumin 2.9  . SCOLIOSIS 10/28/2009  . Scoliosis 10/27/2014  . Thrombocytosis ([place]) 05/31/2015   06/10/15 plt 602 07/23/15 plt 335 11/15/15 Hgb 12.8, Na 139, K 4.2, Bun 14, creat 0.68, TP 5.7, albumin 3.5, TSH 1.54, Hgb a1c 4.4  . TIA (transient ischemic attack) 09/20/2006   Past Surgical History:  Procedure Laterality Date  . ABDOMINAL HYSTERECTOMY    . APPENDECTOMY    . CHOLECYSTECTOMY    . TONSILLECTOMY     Family History  Problem Relation Age of Onset  . Pancreatic cancer Father 55       deceased  . Heart failure Mother 104       deceased  . Leukemia Brother 47       deceased  . Diabetes type II Unknown   . Alcohol abuse Unknown    Social History   Socioeconomic History  . Marital status: Widowed    Spouse name: Not on file  . Number of children: Not on file  . Years of education: Not on file  . Highest education level: Not on file  Occupational History  . Occupation: retired Tour manager  . Financial resource strain: Not hard at all  . Food insecurity:    Worry: Never true    Inability: Never true  . Transportation needs:    Medical: No    Non-medical: No  Tobacco Use  . Smoking status: Never Smoker  . Smokeless tobacco: Never Used  Substance and Sexual Activity  . Alcohol use: No  . Drug use: No  . Sexual activity: Never  Lifestyle  . Physical activity:    Days per week: 0 days    Minutes per session: 0 min  .  Stress: Not at all  Relationships  . Social connections:    Talks on phone: Never    Gets together: Once a week    Attends religious service: Never    Active member of club or organization: No    Attends meetings of clubs or organizations: Never    Relationship status: Widowed  Other Topics Concern  . Not on file  Social History Narrative   Widowed from 2nd husband in 2000.    L[facility] alone at [facility], moved to [place] 01/15/14   Hobbies: crossword puzzles, used to knit   Never smoked   Alcohol  none   Caffeine none   Exercise walking   Walks with walker   DNR, POA, Living Will    Outpatient Encounter Medications as of 01/04/2018  Medication Sig  . acetaminophen (TYLENOL) 325 MG tablet Take 650 mg by mouth every 6 (six) hours as needed.  Marland Kitchen albuterol (PROVENTIL HFA;VENTOLIN HFA) 108 (90 BASE) MCG/ACT inhaler Inhale 2 puffs into the lungs every 6 (six) hours as needed for wheezing or shortness of breath.  Marland Kitchen alendronate (FOSAMAX) 70 MG tablet Take 70 mg by mouth once a week. Take with a full glass of water on an empty stomach.  Marland Kitchen alum & mag hydroxide-simeth (MAALOX/MYLANTA) 200-200-20 MG/5ML suspension Take 30 mLs by mouth every 4 (four) hours as needed for indigestion or heartburn.   Marland Kitchen amLODipine (NORVASC) 2.5 MG tablet Take 2.5 mg by mouth daily. Hold if SBP <110  . calcium carbonate (OSCAL) 1500 (600 Ca) MG TABS tablet Take 600 mg of elemental calcium by mouth 2 (two) times daily with a meal.  . camphor-menthol (SARNA) lotion Apply 1 application topically daily. To arms and legs. Can also use as needed.  . cetaphil (CETAPHIL) cream Apply 1 application topically daily.  . clobetasol cream (TEMOVATE) 4.17 % Apply 1 application topically 2 (two) times daily as needed.  . donepezil (ARICEPT) 5 MG tablet Take 5 mg by mouth at bedtime.  . famotidine (PEPCID) 20 MG tablet Take 10 mg by mouth at bedtime.  . furosemide (LASIX) 20 MG tablet Take 0.5 tablets (10 mg total) by mouth  daily.  Marland Kitchen loperamide (IMODIUM) 2 MG capsule Take 2 mg by mouth as needed for diarrhea or loose stools. Max 16 mg/24 hours call MD if Diarrhea continues  . losartan (COZAAR) 100 MG tablet Take 100 mg by mouth daily with breakfast.   . Mirabegron ER (MYRBETRIQ) 25 MG TB24 Take 25 mg by mouth daily with breakfast. Prescribed by urology  . nitroGLYCERIN (NITROSTAT) 0.4 MG SL tablet Place 0.4 mg under the tongue every 5 (five) minutes as needed for chest pain. After taking three times and no relief call MD.  . polyethylene glycol (MIRALAX / GLYCOLAX) packet Take 17 g by mouth daily as needed for moderate constipation.   . potassium chloride (MICRO-K) 10 MEQ CR capsule Take 10 mEq by mouth daily.  Marland Kitchen pyridOXINE (VITAMIN B-6) 100 MG tablet Take 100 mg by mouth daily with breakfast.    No facility-administered encounter medications on file as of 01/04/2018.     Activities of Daily Living In your present state of health, do you have any difficulty performing the following activities: 01/04/2018  Hearing? N  Vision? N  Difficulty concentrating or making decisions? Y  Walking or climbing stairs? Y  Dressing or bathing? Y  Doing errands, shopping? Y  Preparing Food and eating ? Y  Using the Toilet? Y  In the past six months, have you accidently leaked urine? Y  Do you have problems with loss of bowel control? Y  Managing your Medications? Y  Managing your Finances? Y  Housekeeping or managing your Housekeeping? Y  Some recent data might be hidden    Patient Care Team: Blanchie Serve, MD as PCP - General (Internal Medicine) [name], [name], NP as Nurse Practitioner (Nurse Practitioner)    Assessment:   This is a routine wellness examination for [facility].  Exercise Activities and Dietary recommendations Current Exercise Habits: The patient does not participate in regular exercise at present, Exercise limited by: orthopedic condition(s);neurologic condition(s)  Goals  None     Fall Risk Fall  Risk  01/04/2018 12/29/2016 11/02/2016 06/20/2015 05/23/2015  Falls in the past year? No No Yes No No  Comment - - Emmi Telephone Survey: data to providers prior to load - -  Number falls in past yr: - - 2 or more - -  Comment - - Emmi Telephone Survey Actual Response = 90 - -  Injury with Fall? - - Yes - -   Is the patient's home free of loose throw rugs in walkways, pet beds, electrical cords, etc?   yes      Grab bars in the bathroom? yes      Handrails on the stairs?   yes      Adequate lighting?   yes  Depression Screen PHQ 2/9 Scores 01/04/2018 12/29/2016 06/20/2015 05/23/2015  PHQ - 2 Score 0 0 0 0     Cognitive Function MMSE - Mini Mental State Exam 01/04/2018 12/08/2016 12/08/2016 06/24/2016  Orientation to time 1 2 2 3   Orientation to Place 4 5 5 5   Registration 3 3 3 3   Attention/ Calculation 5 2 2 5   Recall 0 0 0 0  Language- name 2 objects 2 2 2 2   Language- repeat 1 1 1 1   Language- follow 3 step command 3 3 3 3   Language- read & follow direction 1 1 1 1   Write a sentence 1 1 1 1   Copy design 1 1 1 1   Total score 22 21 21 25         Immunization History  Administered Date(s) Administered  . HiB (PRP-OMP) 02/12/2004  . Influenza Split 01/11/2012  . Influenza Whole 01/07/2009  . Influenza-Unspecified 01/11/2014, 01/01/2015, 01/29/2016, 02/01/2017  . PPD Test 11/14/2014, 06/03/2015, 06/17/2015  . Pneumococcal Conjugate-13 02/08/2017  . Pneumococcal Polysaccharide-23 04/28/2010  . Tdap 08/24/2011  . Zoster 04/13/2010    Qualifies for Shingles Vaccine? Not in past records  Screening Tests Health Maintenance  Topic Date Due  . INFLUENZA VACCINE  11/11/2017  . TETANUS/TDAP  08/23/2021  . DEXA SCAN  Completed  . PNA vac Low Risk Adult  Completed    Cancer Screenings: Lung: Low Dose CT Chest recommended if Age 34-80 years, 30 pack-year currently smoking OR have quit w/in 15years. Patient does not qualify. Breast:  Up to date on Mammogram? Yes   Up to date of Bone  Density/Dexa? Yes Colorectal: up to date  Additional Screenings:  Hepatitis C Screening: declined Flu vaccine due: will receive at [place]:    I have personally reviewed and addressed the Medicare Annual Wellness questionnaire and have noted the following in the patient's chart:  A. Medical and social history B. Use of alcohol, tobacco or illicit drugs  C. Current medications and supplements D. Functional ability and status E.  Nutritional status F.  Physical activity G. Advance directives H. List of other physicians I.  Hospitalizations, surgeries, and ER visits in previous 12 months J.  [place] to include hearing, vision, cognitive, depression L. Referrals and appointments - none  In addition, I have reviewed and discussed with patient certain preventive protocols, quality metrics, and best practice recommendations. A written personalized care plan for preventive services as well as general preventive health recommendations were provided to patient.  See attached scanned questionnaire for additional information.   Signed,   Tyson Dense, RN Nurse Health Advisor  Patient Concerns: None

## 2018-01-07 ENCOUNTER — Encounter: Payer: Self-pay | Admitting: Nurse Practitioner

## 2018-01-07 ENCOUNTER — Non-Acute Institutional Stay (SKILLED_NURSING_FACILITY): Payer: Medicare Other | Admitting: Nurse Practitioner

## 2018-01-07 DIAGNOSIS — N3281 Overactive bladder: Secondary | ICD-10-CM

## 2018-01-07 DIAGNOSIS — I10 Essential (primary) hypertension: Secondary | ICD-10-CM

## 2018-01-07 DIAGNOSIS — F0390 Unspecified dementia without behavioral disturbance: Secondary | ICD-10-CM

## 2018-01-07 DIAGNOSIS — I739 Peripheral vascular disease, unspecified: Secondary | ICD-10-CM

## 2018-01-07 DIAGNOSIS — K219 Gastro-esophageal reflux disease without esophagitis: Secondary | ICD-10-CM | POA: Diagnosis not present

## 2018-01-07 DIAGNOSIS — F03A0 Unspecified dementia, mild, without behavioral disturbance, psychotic disturbance, mood disturbance, and anxiety: Secondary | ICD-10-CM

## 2018-01-07 NOTE — Assessment & Plan Note (Signed)
Continue [facility] for safety and care assistance, continue Donepezil 5mg  qd to preserve memory.

## 2018-01-07 NOTE — Assessment & Plan Note (Signed)
Chronic trace edema BLE, stable weights, no open wounds, continue Furosemide 20mg  qd.

## 2018-01-07 NOTE — Progress Notes (Signed)
Location:   SNF [place] Room Number: N 3 Place of Service:  SNF (31) Provider: Lennie Odor Bricelyn Freestone NP  Blanchie Serve, MD  Patient Care Team: Blanchie Serve, MD as PCP - General (Internal Medicine) Draycen Leichter X, NP as Nurse Practitioner (Nurse Practitioner)  Extended Emergency Contact Information Primary Emergency Contact: [name] Address: [place], [place] [address] [name] of [place] Phone: [phone] Work Phone: [phone] Mobile Phone: [phone] Relation: Daughter Secondary Emergency Contact: [name] Address: [address] [place] [address] [name] of [place] Phone: [phone] Mobile Phone: [phone] Relation: Son  Code Status:  DNR Goals of care: Advanced Directive information Advanced Directives 01/07/2018  Does Patient Have a Medical Advance Directive? Yes  Type of Paramedic of [place];Out of facility DNR (pink MOST or yellow form)  Does patient want to make changes to medical advance directive? -  Copy of [place] in Chart? Yes  Pre-existing out of facility DNR order (yellow form or pink MOST form) Yellow form placed in chart (order not valid for inpatient use)     Chief Complaint  Patient presents with  . Medical Management of Chronic Issues    Resident is being seen for a routine visit    HPI:  Pt is a 84 y.o. female seen today for medical management of chronic diseases.    The patient Hx of urinary frequency, well managed on Mirabegron 25mg  qd. She resides in [facility] for safety and care assistance, on Donepezil 5mg  qd for memory. Hx of HTN, blood pressure is controlled on Amlodipine 2.5mg  qd, Losartan 100mg  qd. Chronic leg edema, stable on Furosemide 20mg  qd. GERD stable, taking Famotidine 10mg  qd.   Past Medical History:  Diagnosis Date  . Abnormality of gait 04/28/2010   History of fall onto right shoulder-has  participated in PT. Uses walker.     . Acute encephalopathy 10/25/14   Occurred when septic  . Acute lower GI bleeding 10/30/2014  . Adjustment disorder with mixed anxiety and depressed mood 06/04/2015   11/15/15 Hgb 12.8, Na 139, K 4.2, Bun 14, creat 0.68, TP 5.7, albumin 3.5, TSH 1.54, Hgb a1c 4.4   . Anemia   . Aortic atherosclerosis ([place]) 10/27/2014  . Asthma   . Cerebral atrophy 10/27/2014  . Cerebrovascular disease 10/27/2014   Small vessel disease  . Cervical disc disease   . Chronic lower back pain 06/04/2015   lumbar spondylosis. Regular f/u Dr. French Ana. Receives injections last 01/2013.  05/27/15 Xray left hip/pelvis: spondylotic changes, severe osteoarthritis Continue Tylenol and Tramadol prn for pain, observe the patient, adding Cymbalta may help. Mid to lower back pain more on the right side back, may consider X-ray of thoracic spine. 06/01/15 CT thoracic spine showed no acute changes.  Patient received 10 injection in the back through Dr. Alvester Morin office. She says the previous problems of discomfort in the back and down the leg have completely resolved. Takes Aleve 220mg  bid   . Debility 06/20/2015   11/15/15 Hgb 12.8, Na 139, K 4.2, Bun 14, creat 0.68, TP 5.7, albumin 3.5, TSH 1.54, Hgb a1c 4.4   . Degenerative disc disease, lumbar 10/27/2014  . Depression   . Dermatitis 03/25/2016  . Diarrhea 10/25/14  . Diverticulosis 10/27/2014  . Essential hypertension 09/20/2006   11/15/15 Hgb 12.8, Na 139, K 4.2, Bun 14, creat  0.68, TP 5.7, albumin 3.5, TSH 1.54, Hgb a1c 4.4   . Fall at nursing home 10/25/2014  . GERD (gastroesophageal reflux disease)   . History of cardiovascular disorder 09/20/2006   X 3 in 2000 now on aspirin.    Marland Kitchen Hyperlipidemia 09/20/2006   05/15/14 LDL 95   . Hypertension   . Lumbosacral spondylosis 10/28/2009  . OSTEOPOROSIS 09/20/2006   Noted 2008    . Overactive bladder 11/29/2013   Myrbetriq prescribed by urology.    . Protein calorie malnutrition ([place])    06/06/15 TP 6.0  albumin 2.9  . SCOLIOSIS 10/28/2009  . Scoliosis 10/27/2014  . Thrombocytosis ([place]) 05/31/2015   06/10/15 plt 602 07/23/15 plt 335 11/15/15 Hgb 12.8, Na 139, K 4.2, Bun 14, creat 0.68, TP 5.7, albumin 3.5, TSH 1.54, Hgb a1c 4.4  . TIA (transient ischemic attack) 09/20/2006   Past Surgical History:  Procedure Laterality Date  . ABDOMINAL HYSTERECTOMY    . APPENDECTOMY    . CHOLECYSTECTOMY    . TONSILLECTOMY      Allergies  Allergen Reactions  . Tramadol Other (See Comments)    Hallucinations, agitation/combativeness  . Amoxicillin     Per MAR  . Oxycodone Other (See Comments)    Per MAR   . Sulfamethoxazole     Per MAR   . Penicillins Rash    Allergies as of 01/07/2018      Reactions   Tramadol Other (See Comments)   Hallucinations, agitation/combativeness   Amoxicillin    Per MAR   Oxycodone Other (See Comments)   Per MAR    Sulfamethoxazole    Per MAR    Penicillins Rash      Medication List        Accurate as of 01/07/18 11:59 PM. Always use your most recent med list.          acetaminophen 325 MG tablet Commonly known as:  TYLENOL Take 650 mg by mouth every 6 (six) hours as needed.   albuterol 108 (90 Base) MCG/ACT inhaler Commonly known as:  PROVENTIL HFA;VENTOLIN HFA Inhale 2 puffs into the lungs every 6 (six) hours as needed for wheezing or shortness of breath.   alendronate 70 MG tablet Commonly known as:  FOSAMAX Take 70 mg by mouth once a week. Take with a full glass of water on an empty stomach.   alum & mag hydroxide-simeth 200-200-20 MG/5ML suspension Commonly known as:  MAALOX/MYLANTA Take 30 mLs by mouth every 4 (four) hours as needed for indigestion or heartburn.   amLODipine 2.5 MG tablet Commonly known as:  NORVASC Take 2.5 mg by mouth daily. Hold if SBP <110   calcium carbonate 1500 (600 Ca) MG Tabs tablet Commonly known as:  OSCAL Take 600 mg of elemental calcium by mouth 2 (two) times daily with a meal.   camphor-menthol  lotion Commonly known as:  SARNA Apply 1 application topically daily. To arms and legs. Can also use as needed.   cetaphil cream Apply 1 application topically daily.   clobetasol cream 0.05 % Commonly known as:  TEMOVATE Apply 1 application topically 2 (two) times daily as needed.   donepezil 5 MG tablet Commonly known as:  ARICEPT Take 5 mg by mouth at bedtime.   famotidine 20 MG tablet Commonly known as:  PEPCID Take 10 mg by mouth at bedtime.   furosemide 20 MG tablet Commonly known as:  LASIX Take 0.5 tablets (10 mg total) by mouth daily.   loperamide 2 MG  capsule Commonly known as:  IMODIUM Take 2 mg by mouth as needed for diarrhea or loose stools. Max 16 mg/24 hours call MD if Diarrhea continues   losartan 100 MG tablet Commonly known as:  COZAAR Take 100 mg by mouth daily with breakfast.   MYRBETRIQ 25 MG Tb24 tablet Generic drug:  mirabegron ER Take 25 mg by mouth daily with breakfast. Prescribed by urology   nitroGLYCERIN 0.4 MG SL tablet Commonly known as:  NITROSTAT Place 0.4 mg under the tongue every 5 (five) minutes as needed for chest pain. After taking three times and no relief call MD.   polyethylene glycol packet Commonly known as:  MIRALAX / GLYCOLAX Take 17 g by mouth daily as needed for moderate constipation.   potassium chloride 10 MEQ CR capsule Commonly known as:  MICRO-K Take 10 mEq by mouth daily.   pyridOXINE 100 MG tablet Commonly known as:  VITAMIN B-6 Take 100 mg by mouth daily with breakfast.      ROS was provided with assistance of staff Review of Systems  Constitutional: Negative for activity change, appetite change, chills, diaphoresis, fatigue, fever and unexpected weight change.  HENT: Positive for hearing loss. Negative for congestion and voice change.   Respiratory: Negative for cough, shortness of breath and wheezing.   Cardiovascular: Positive for leg swelling. Negative for chest pain.  Gastrointestinal: Negative for  abdominal distention, abdominal pain, constipation, diarrhea, nausea and vomiting.  Genitourinary: Negative for difficulty urinating, dysuria and urgency.  Musculoskeletal: Positive for arthralgias and gait problem.  Skin: Negative for color change and pallor.  Neurological: Negative for dizziness, speech difficulty, weakness and headaches.       Memory lapses.   Psychiatric/Behavioral: Positive for confusion. Negative for agitation, behavioral problems, hallucinations and sleep disturbance. The patient is not nervous/anxious.     Immunization History  Administered Date(s) Administered  . HiB (PRP-OMP) 02/12/2004  . Influenza Split 01/11/2012  . Influenza Whole 01/07/2009  . Influenza-Unspecified 01/11/2014, 01/01/2015, 01/29/2016, 02/01/2017  . PPD Test 11/14/2014, 06/03/2015, 06/17/2015  . Pneumococcal Conjugate-13 02/08/2017  . Pneumococcal Polysaccharide-23 04/28/2010  . Tdap 08/24/2011  . Zoster 04/13/2010   Pertinent  Health Maintenance Due  Topic Date Due  . INFLUENZA VACCINE  02/10/2018 (Originally 11/11/2017)  . DEXA SCAN  Completed  . PNA vac Low Risk Adult  Completed   Fall Risk  01/04/2018 12/29/2016 11/02/2016 06/20/2015 05/23/2015  Falls in the past year? No No Yes No No  Comment - - Emmi Telephone Survey: data to providers prior to load - -  Number falls in past yr: - - 2 or more - -  Comment - - Emmi Telephone Survey Actual Response = 90 - -  Injury with Fall? - - Yes - -   Functional Status Survey:    Vitals:   01/07/18 1131  BP: (!) 152/70  Pulse: 71  Resp: (!) 22  Temp: 98.3 F (36.8 C)  TempSrc: Oral  Weight: 157 lb (71.2 kg)  Height: 5\' 5"  (1.651 m)   Body mass index is 26.13 kg/m. Physical Exam  Constitutional: She appears well-developed and well-nourished.  HENT:  Head: Normocephalic and atraumatic.  Eyes: Pupils are equal, round, and reactive to light. EOM are normal.  Neck: Normal range of motion. Neck supple. No JVD present. No thyromegaly  present.  Cardiovascular: Normal rate and regular rhythm.  No murmur heard. Pulmonary/Chest: Effort normal. She has no wheezes. She has no rales.  Abdominal: Soft. She exhibits no distension. There is no  tenderness.  Musculoskeletal: She exhibits edema.  Trace edema BLE is chronic, no change. Needs assistance for transfer. W/c for mobility.   Neurological: She is alert. No cranial nerve deficit. She exhibits normal muscle tone. Coordination normal.  Oriented to person and place.   Skin: Skin is warm and dry.  Psychiatric: She has a normal mood and affect. Her behavior is normal.    Labs reviewed: Recent Labs    07/13/17 09/21/17 11/23/17  NA 141 143 141  K 3.6 3.5 3.9  CL  --  111 108  CO2  --  28 27  BUN 17 15 16   CREATININE 0.8 0.8 0.8  CALCIUM  --  9.3 9.4  MG  --   --  1.9   Recent Labs    03/09/17 06/24/17 11/23/17  AST 25 25 17   ALT 23  --  15  ALKPHOS 61 47 50  PROT  --   --  5.4  ALBUMIN  --   --  3.2   Recent Labs    03/09/17 07/13/17 11/23/17  WBC 8.6 7.4 7.3  HGB 15.0 12.4 12.7  HCT 43 36 37  PLT 392 385 347   Lab Results  Component Value Date   TSH 1.90 06/24/2017   Lab Results  Component Value Date   HGBA1C 4.4 11/14/2015   Lab Results  Component Value Date   CHOL 176 05/15/2014   HDL 62 05/15/2014   LDLCALC 95 05/15/2014   LDLDIRECT 114.3 12/08/2007   TRIG 93 05/15/2014   CHOLHDL 2.9 CALC 12/08/2007    Significant Diagnostic Results in last 30 days:  No results found.  Assessment/Plan  Overactive bladder Stable, continue Mirabegron 25mg  qd.   PVD (peripheral vascular disease) (HCC) Chronic trace edema BLE, stable weights, no open wounds, continue Furosemide 20mg  qd.   Essential hypertension Controlled blood pressure, continue Losartan 100mg  qd, Amlodipine 2.5mg  qd.   GERD (gastroesophageal reflux disease) Stable, continue Famotidine 10mg  qd.   Mild dementia Continue SNF [facility] for safety and care assistance, continue Donepezil 5mg   qd to preserve memory.    Family/ staff Communication: plan of care reviewed with the patient and charge nurse.   Labs/tests ordered:  None  Time spend 25 minutes.

## 2018-01-07 NOTE — Assessment & Plan Note (Signed)
Stable, continue Mirabegron 25mg qd.  

## 2018-01-07 NOTE — Assessment & Plan Note (Signed)
Stable, continue Famotidine 10mg qd.  

## 2018-01-07 NOTE — Assessment & Plan Note (Signed)
Controlled blood pressure, continue Losartan 100mg  qd, Amlodipine 2.5mg  qd.

## 2018-01-10 ENCOUNTER — Encounter: Payer: Self-pay | Admitting: Nurse Practitioner

## 2018-01-21 DIAGNOSIS — M47897 Other spondylosis, lumbosacral region: Secondary | ICD-10-CM | POA: Diagnosis not present

## 2018-01-21 DIAGNOSIS — E162 Hypoglycemia, unspecified: Secondary | ICD-10-CM | POA: Diagnosis not present

## 2018-01-21 DIAGNOSIS — M25529 Pain in unspecified elbow: Secondary | ICD-10-CM | POA: Diagnosis not present

## 2018-01-21 DIAGNOSIS — M6281 Muscle weakness (generalized): Secondary | ICD-10-CM | POA: Diagnosis not present

## 2018-01-21 DIAGNOSIS — H81399 Other peripheral vertigo, unspecified ear: Secondary | ICD-10-CM | POA: Diagnosis not present

## 2018-01-21 DIAGNOSIS — I10 Essential (primary) hypertension: Secondary | ICD-10-CM | POA: Diagnosis not present

## 2018-01-21 DIAGNOSIS — K219 Gastro-esophageal reflux disease without esophagitis: Secondary | ICD-10-CM | POA: Diagnosis not present

## 2018-01-21 DIAGNOSIS — R278 Other lack of coordination: Secondary | ICD-10-CM | POA: Diagnosis not present

## 2018-01-21 DIAGNOSIS — R293 Abnormal posture: Secondary | ICD-10-CM | POA: Diagnosis not present

## 2018-01-21 DIAGNOSIS — M5090 Cervical disc disorder, unspecified, unspecified cervical region: Secondary | ICD-10-CM | POA: Diagnosis not present

## 2018-01-25 DIAGNOSIS — I10 Essential (primary) hypertension: Secondary | ICD-10-CM | POA: Diagnosis not present

## 2018-01-25 DIAGNOSIS — R278 Other lack of coordination: Secondary | ICD-10-CM | POA: Diagnosis not present

## 2018-01-25 DIAGNOSIS — M6281 Muscle weakness (generalized): Secondary | ICD-10-CM | POA: Diagnosis not present

## 2018-01-25 DIAGNOSIS — K219 Gastro-esophageal reflux disease without esophagitis: Secondary | ICD-10-CM | POA: Diagnosis not present

## 2018-01-25 DIAGNOSIS — H81399 Other peripheral vertigo, unspecified ear: Secondary | ICD-10-CM | POA: Diagnosis not present

## 2018-01-25 DIAGNOSIS — R293 Abnormal posture: Secondary | ICD-10-CM | POA: Diagnosis not present

## 2018-01-27 ENCOUNTER — Non-Acute Institutional Stay (SKILLED_NURSING_FACILITY): Payer: Medicare Other | Admitting: Family Medicine

## 2018-01-27 DIAGNOSIS — R278 Other lack of coordination: Secondary | ICD-10-CM | POA: Diagnosis not present

## 2018-01-27 DIAGNOSIS — F0390 Unspecified dementia without behavioral disturbance: Secondary | ICD-10-CM | POA: Diagnosis not present

## 2018-01-27 DIAGNOSIS — J410 Simple chronic bronchitis: Secondary | ICD-10-CM

## 2018-01-27 DIAGNOSIS — H81399 Other peripheral vertigo, unspecified ear: Secondary | ICD-10-CM | POA: Diagnosis not present

## 2018-01-27 DIAGNOSIS — K219 Gastro-esophageal reflux disease without esophagitis: Secondary | ICD-10-CM | POA: Diagnosis not present

## 2018-01-27 DIAGNOSIS — R293 Abnormal posture: Secondary | ICD-10-CM | POA: Diagnosis not present

## 2018-01-27 DIAGNOSIS — I10 Essential (primary) hypertension: Secondary | ICD-10-CM | POA: Diagnosis not present

## 2018-01-27 DIAGNOSIS — N3281 Overactive bladder: Secondary | ICD-10-CM

## 2018-01-27 DIAGNOSIS — M6281 Muscle weakness (generalized): Secondary | ICD-10-CM | POA: Diagnosis not present

## 2018-01-27 DIAGNOSIS — F03A0 Unspecified dementia, mild, without behavioral disturbance, psychotic disturbance, mood disturbance, and anxiety: Secondary | ICD-10-CM

## 2018-01-27 NOTE — Progress Notes (Addendum)
Location: [place] Room Number: 3 Place of Service:  SNF [id number])  Provider: Alain Honey  MD  Code Status:  DNR Goals of Care:  Advanced Directives 01/27/2018  Does Patient Have a Medical Advance Directive? Yes  Type of Paramedic of [place];Out of facility DNR (pink MOST or yellow form)  Does patient want to make changes to medical advance directive? No - Patient declined  Copy of [place] in Chart? Yes  Pre-existing out of facility DNR order (yellow form or pink MOST form) Yellow form placed in chart (order not valid for inpatient use)     Chief Complaint  Patient presents with  . Acute Visit    HPI: Patient is a 84 y.o. female seen today for an acute visit for advanced care planning.  First met with Education officer, museum, daughter, and son to discuss most form.  This was completed and will be placed in her medical record Son wanted to be sure that we are aware of her bronchial problems, which the patient denied.  There have also been some cognitive issues that children point out but are not readily evident by per bruising nurse's notes.  In talking with patient she had no specific complaints.  Today [birth year] her birthday.  She denied having any respiratory issues.  She reads lips and wears hearing aids.  Past Medical History:  Diagnosis Date  . Abnormality of gait 04/28/2010   History of fall onto right shoulder-has participated in PT. Uses walker.     . Acute encephalopathy 10/25/14   Occurred when septic  . Acute lower GI bleeding 10/30/2014  . Adjustment disorder with mixed anxiety and depressed mood 06/04/2015   11/15/15 Hgb 12.8, Na 139, K 4.2, Bun 14, creat 0.68, TP 5.7, albumin 3.5, TSH 1.54, Hgb a1c 4.4   . Anemia   . Aortic atherosclerosis ([place]) 10/27/2014  . Asthma   . Cerebral atrophy 10/27/2014  . Cerebrovascular disease 10/27/2014   Small vessel disease  . Cervical disc disease   . Chronic lower back  pain 06/04/2015   lumbar spondylosis. Regular f/u Dr. French Ana. Receives injections last 01/2013.  05/27/15 Xray left hip/pelvis: spondylotic changes, severe osteoarthritis Continue Tylenol and Tramadol prn for pain, observe the patient, adding Cymbalta may help. Mid to lower back pain more on the right side back, may consider X-ray of thoracic spine. 06/01/15 CT thoracic spine showed no acute changes.  Patient received 10 injection in the back through Dr. Alvester Morin office. She says the previous problems of discomfort in the back and down the leg have completely resolved. Takes Aleve 235m bid   . Debility 06/20/2015   11/15/15 Hgb 12.8, Na 139, K 4.2, Bun 14, creat 0.68, TP 5.7, albumin 3.5, TSH 1.54, Hgb a1c 4.4   . Degenerative disc disease, lumbar 10/27/2014  . Depression   . Dermatitis 03/25/2016  . Diarrhea 10/25/14  . Diverticulosis 10/27/2014  . Essential hypertension 09/20/2006   11/15/15 Hgb 12.8, Na 139, K 4.2, Bun 14, creat 0.68, TP 5.7, albumin 3.5, TSH 1.54, Hgb a1c 4.4   . Fall at nursing home 10/25/2014  . GERD (gastroesophageal reflux disease)   . History of cardiovascular disorder 09/20/2006   X 3 in 2000 now on aspirin.    .Marland KitchenHyperlipidemia 09/20/2006   05/15/14 LDL 95   . Hypertension   . Lumbosacral spondylosis 10/28/2009  . OSTEOPOROSIS 09/20/2006   Noted 2008    . Overactive bladder 11/29/2013  Myrbetriq prescribed by urology.    . Protein calorie malnutrition ([place])    06/06/15 TP 6.0 albumin 2.9  . SCOLIOSIS 10/28/2009  . Scoliosis 10/27/2014  . Thrombocytosis ([place]) 05/31/2015   06/10/15 plt 602 07/23/15 plt 335 11/15/15 Hgb 12.8, Na 139, K 4.2, Bun 14, creat 0.68, TP 5.7, albumin 3.5, TSH 1.54, Hgb a1c 4.4  . TIA (transient ischemic attack) 09/20/2006    Past Surgical History:  Procedure Laterality Date  . ABDOMINAL HYSTERECTOMY    . APPENDECTOMY    . CHOLECYSTECTOMY    . TONSILLECTOMY      Allergies  Allergen Reactions  . Tramadol Other (See Comments)    Hallucinations,  agitation/combativeness  . Amoxicillin     Per MAR  . Oxycodone Other (See Comments)    Per MAR   . Sulfamethoxazole     Per MAR   . Penicillins Rash    Outpatient Encounter Medications as of 01/27/2018  Medication Sig  . acetaminophen (TYLENOL) 325 MG tablet Take 650 mg by mouth every 6 (six) hours as needed.  Marland Kitchen albuterol (PROVENTIL HFA;VENTOLIN HFA) 108 (90 BASE) MCG/ACT inhaler Inhale 2 puffs into the lungs every 6 (six) hours as needed for wheezing or shortness of breath.  Marland Kitchen alendronate (FOSAMAX) 70 MG tablet Take 70 mg by mouth once a week. Take with a full glass of water on an empty stomach.  Marland Kitchen alum & mag hydroxide-simeth (MAALOX/MYLANTA) 200-200-20 MG/5ML suspension Take 30 mLs by mouth every 4 (four) hours as needed for indigestion or heartburn.   Marland Kitchen amLODipine (NORVASC) 2.5 MG tablet Take 2.5 mg by mouth daily. Hold if SBP <110  . calcium carbonate (OSCAL) 1500 (600 Ca) MG TABS tablet Take 600 mg of elemental calcium by mouth 2 (two) times daily with a meal.  . camphor-menthol (SARNA) lotion Apply 1 application topically daily. To arms and legs. Can also use as needed.  . cetaphil (CETAPHIL) cream Apply 1 application topically daily.  . clobetasol cream (TEMOVATE) 0.86 % Apply 1 application topically 2 (two) times daily as needed.  . donepezil (ARICEPT) 5 MG tablet Take 5 mg by mouth at bedtime.  . famotidine (PEPCID) 20 MG tablet Take 10 mg by mouth at bedtime.  . furosemide (LASIX) 20 MG tablet Take 0.5 tablets (10 mg total) by mouth daily.  . Mirabegron ER (MYRBETRIQ) 25 MG TB24 Take 25 mg by mouth daily with breakfast. Prescribed by urology  . nitroGLYCERIN (NITROSTAT) 0.4 MG SL tablet Place 0.4 mg under the tongue every 5 (five) minutes as needed for chest pain. After taking three times and no relief call MD.  . polyethylene glycol (MIRALAX / GLYCOLAX) packet Take 17 g by mouth daily as needed for moderate constipation.   . potassium chloride (MICRO-K) 10 MEQ CR capsule Take  10 mEq by mouth daily.  Marland Kitchen pyridOXINE (VITAMIN B-6) 100 MG tablet Take 100 mg by mouth daily with breakfast.   . [DISCONTINUED] loperamide (IMODIUM) 2 MG capsule Take 2 mg by mouth as needed for diarrhea or loose stools. Max 16 mg/24 hours call MD if Diarrhea continues  . [DISCONTINUED] losartan (COZAAR) 100 MG tablet Take 100 mg by mouth daily with breakfast.    No facility-administered encounter medications on file as of 01/27/2018.     Review of Systems:  Review of Systems  Constitutional: Negative.   HENT: Positive for hearing loss.   Respiratory: Negative.   Cardiovascular: Positive for leg swelling.  Gastrointestinal: Negative.   Genitourinary: Negative.   Musculoskeletal:  Positive for gait problem.  Hematological: Negative.   Psychiatric/Behavioral: Positive for confusion.    Health Maintenance  Topic Date Due  . INFLUENZA VACCINE  02/10/2018 (Originally 11/11/2017)  . TETANUS/TDAP  08/23/2021  . DEXA SCAN  Completed  . PNA vac Low Risk Adult  Completed    Physical Exam: There were no vitals filed for this visit. There is no height or weight on file to calculate BMI. Physical Exam  Constitutional: She appears well-developed and well-nourished.  HENT:  Head: Normocephalic.  Mouth/Throat: Oropharynx is clear and moist.  Eyes: Pupils are equal, round, and reactive to light. EOM are normal.  Cardiovascular: Normal rate, regular rhythm and normal heart sounds.  Pulmonary/Chest: Effort normal and breath sounds normal.  Abdominal: Soft.  Musculoskeletal: She exhibits edema.  Neurological: She is alert.  Oriented to person place and year  Skin: Skin is warm and dry.  Psychiatric: She has a normal mood and affect. Her behavior is normal. Judgment and thought content normal.  Nursing note and vitals reviewed.   Labs reviewed: Basic Metabolic Panel: Recent Labs    06/24/17 07/13/17 09/21/17 11/23/17  NA 139 141 143 141  K 4.2 3.6 3.5 3.9  CL  --   --  111 108  CO2  --    --  28 27  BUN _0 CREATININE 0.8 0.8 0.8 0.8  CALCIUM  --   --  9.3 9.4  MG  --   --   --  1.9  TSH 1.90  --   --   --    Liver Function Tests: Recent Labs    03/09/17 06/24/17 11/23/17  AST _1 ALT 23  --  15  ALKPHOS 61 47 50  PROT  --   --  5.4  ALBUMIN  --   --  3.2   No results for input(s): LIPASE, AMYLASE in the last 8760 hours. No results for input(s): AMMONIA in the last 8760 hours. CBC: Recent Labs    03/09/17 07/13/17 11/23/17  WBC 8.6 7.4 7.3  HGB 15.0 12.4 12.7  HCT 43 36 37  PLT 392 385 347   Lipid Panel: No results for input(s): CHOL, HDL, LDLCALC, TRIG, CHOLHDL, LDLDIRECT in the last 8760 hours. Lab Results  Component Value Date   HGBA1C 4.4 11/14/2015    Procedures since last visit: No results found.  Assessment/Plan There are no diagnoses linked to this encounter.  Time spent 30 minutes  Labs/tests ordered:  _2 @ Next appt:  Visit date not found Lillette Boxer. Sabra Heck, [place] [address] [place], [place] Office [phone]

## 2018-01-31 DIAGNOSIS — I10 Essential (primary) hypertension: Secondary | ICD-10-CM | POA: Diagnosis not present

## 2018-01-31 DIAGNOSIS — K219 Gastro-esophageal reflux disease without esophagitis: Secondary | ICD-10-CM | POA: Diagnosis not present

## 2018-01-31 DIAGNOSIS — R293 Abnormal posture: Secondary | ICD-10-CM | POA: Diagnosis not present

## 2018-01-31 DIAGNOSIS — R278 Other lack of coordination: Secondary | ICD-10-CM | POA: Diagnosis not present

## 2018-01-31 DIAGNOSIS — H81399 Other peripheral vertigo, unspecified ear: Secondary | ICD-10-CM | POA: Diagnosis not present

## 2018-01-31 DIAGNOSIS — M6281 Muscle weakness (generalized): Secondary | ICD-10-CM | POA: Diagnosis not present

## 2018-02-03 ENCOUNTER — Encounter: Payer: Self-pay | Admitting: Family Medicine

## 2018-02-03 DIAGNOSIS — K219 Gastro-esophageal reflux disease without esophagitis: Secondary | ICD-10-CM | POA: Diagnosis not present

## 2018-02-03 DIAGNOSIS — M6281 Muscle weakness (generalized): Secondary | ICD-10-CM | POA: Diagnosis not present

## 2018-02-03 DIAGNOSIS — I10 Essential (primary) hypertension: Secondary | ICD-10-CM | POA: Diagnosis not present

## 2018-02-03 DIAGNOSIS — R293 Abnormal posture: Secondary | ICD-10-CM | POA: Diagnosis not present

## 2018-02-03 DIAGNOSIS — R278 Other lack of coordination: Secondary | ICD-10-CM | POA: Diagnosis not present

## 2018-02-03 DIAGNOSIS — H81399 Other peripheral vertigo, unspecified ear: Secondary | ICD-10-CM | POA: Diagnosis not present

## 2018-02-03 NOTE — Progress Notes (Signed)
Provider:  Alain Honey, MD Location:  [facility] Room Number: 3 Place of Service:  SNF (31)  PCP: [name], [name], NP Patient Care Team: [name], [name], NP as PCP - General (Internal Medicine) [name], [name], NP as Nurse Practitioner (Nurse Practitioner)  Extended Emergency Contact Information Primary Emergency Contact: [name] Address: [place], [place] [address] [name] of [place] Phone: [phone] Work Phone: [phone] Mobile Phone: [phone] Relation: Daughter Secondary Emergency Contact: [name] Address: [address] [place] [address] [name] of [place] Phone: [phone] Mobile Phone: [phone] Relation: Son  Code Status: DNR Goals of Care: Advanced Directive information Advanced Directives 01/27/2018  Does Patient Have a Medical Advance Directive? Yes  Type of Paramedic of [place];Out of facility DNR (pink MOST or yellow form)  Does patient want to make changes to medical advance directive? No - Patient declined  Copy of [place] in Chart? Yes  Pre-existing out of facility DNR order (yellow form or pink MOST form) Yellow form placed in chart (order not valid for inpatient use)      Chief Complaint  Patient presents with  . Medical Management of Chronic Issues    F/u-HTN, OAB, dementia, GERD, PVD    HPI: Patient is a 84 y.o. female seen today for medical management of chronic problems including:  Past Medical History:  Diagnosis Date  . Abnormality of gait 04/28/2010   History of fall onto right shoulder-has participated in PT. Uses walker.     . Acute encephalopathy 10/25/14   Occurred when septic  . Acute lower GI bleeding 10/30/2014  . Adjustment disorder with mixed anxiety and depressed mood 06/04/2015   11/15/15 Hgb 12.8, Na 139, K 4.2, Bun 14, creat 0.68, TP 5.7, albumin 3.5, TSH 1.54, Hgb a1c 4.4     . Anemia   . Aortic atherosclerosis ([place]) 10/27/2014  . Asthma   . Cerebral atrophy 10/27/2014  . Cerebrovascular disease 10/27/2014   Small vessel disease  . Cervical disc disease   . Chronic lower back pain 06/04/2015   lumbar spondylosis. Regular f/u Dr. French Ana. Receives injections last 01/2013.  05/27/15 Xray left hip/pelvis: spondylotic changes, severe osteoarthritis Continue Tylenol and Tramadol prn for pain, observe the patient, adding Cymbalta may help. Mid to lower back pain more on the right side back, may consider X-ray of thoracic spine. 06/01/15 CT thoracic spine showed no acute changes.  Patient received 10 injection in the back through Dr. Alvester Morin office. She says the previous problems of discomfort in the back and down the leg have completely resolved. Takes Aleve 220mg  bid   . Debility 06/20/2015   11/15/15 Hgb 12.8, Na 139, K 4.2, Bun 14, creat 0.68, TP 5.7, albumin 3.5, TSH 1.54, Hgb a1c 4.4   . Degenerative disc disease, lumbar 10/27/2014  . Depression   . Dermatitis 03/25/2016  . Diarrhea 10/25/14  . Diverticulosis 10/27/2014  . Essential hypertension 09/20/2006   11/15/15 Hgb 12.8, Na 139, K 4.2, Bun 14, creat 0.68, TP 5.7, albumin 3.5, TSH 1.54, Hgb a1c 4.4   . Fall at nursing home 10/25/2014  . GERD (gastroesophageal reflux disease)   . History of cardiovascular disorder 09/20/2006   X 3 in 2000 now on aspirin.    Marland Kitchen Hyperlipidemia 09/20/2006   05/15/14 LDL 95   . Hypertension   .  Lumbosacral spondylosis 10/28/2009  . OSTEOPOROSIS 09/20/2006   Noted 2008    . Overactive bladder 11/29/2013   Myrbetriq prescribed by urology.    . Protein calorie malnutrition ([place])    06/06/15 TP 6.0 albumin 2.9  . SCOLIOSIS 10/28/2009  . Scoliosis 10/27/2014  . Thrombocytosis ([place]) 05/31/2015   06/10/15 plt 602 07/23/15 plt 335 11/15/15 Hgb 12.8, Na 139, K 4.2, Bun 14, creat 0.68, TP 5.7, albumin 3.5, TSH 1.54, Hgb a1c 4.4  . TIA (transient ischemic attack) 09/20/2006   Past Surgical History:   Procedure Laterality Date  . ABDOMINAL HYSTERECTOMY    . APPENDECTOMY    . CHOLECYSTECTOMY    . TONSILLECTOMY      reports that she has never smoked. She has never used smokeless tobacco. She reports that she does not drink alcohol or use drugs. Social History   Socioeconomic History  . Marital status: Widowed    Spouse name: Not on file  . Number of children: Not on file  . Years of education: Not on file  . Highest education level: Not on file  Occupational History  . Occupation: retired Tour manager  . Financial resource strain: Not hard at all  . Food insecurity:    Worry: Never true    Inability: Never true  . Transportation needs:    Medical: No    Non-medical: No  Tobacco Use  . Smoking status: Never Smoker  . Smokeless tobacco: Never Used  Substance and Sexual Activity  . Alcohol use: No  . Drug use: No  . Sexual activity: Never  Lifestyle  . Physical activity:    Days per week: 0 days    Minutes per session: 0 min  . Stress: Not at all  Relationships  . Social connections:    Talks on phone: Never    Gets together: Once a week    Attends religious service: Never    Active member of club or organization: No    Attends meetings of clubs or organizations: Never    Relationship status: Widowed  . Intimate partner violence:    Fear of current or ex partner: No    Emotionally abused: No    Physically abused: No    Forced sexual activity: No  Other Topics Concern  . Not on file  Social History Narrative   Widowed from 2nd husband in 2000.    Lives alone at [facility], moved to [place] 01/15/14   Hobbies: crossword puzzles, used to knit   Never smoked   Alcohol none   Caffeine none   Exercise walking   Walks with walker   DNR, POA, Living Will    Functional Status Survey:    Family History  Problem Relation Age of Onset  . Pancreatic cancer Father 97       deceased  . Heart failure Mother 104       deceased  . Leukemia  Brother 46       deceased  . Diabetes type II Unknown   . Alcohol abuse Unknown     Health Maintenance  Topic Date Due  . TETANUS/TDAP  08/23/2021  . INFLUENZA VACCINE  Completed  . DEXA SCAN  Completed  . PNA vac Low Risk Adult  Completed    Allergies  Allergen Reactions  . Tramadol Other (See Comments)    Hallucinations, agitation/combativeness  . Amoxicillin     Per MAR  . Oxycodone Other (See Comments)    Per  MAR   . Sulfamethoxazole     Per MAR   . Penicillins Rash    Outpatient Encounter Medications as of 02/03/2018  Medication Sig  . acetaminophen (TYLENOL) 325 MG tablet Take 650 mg by mouth every 6 (six) hours as needed.  Marland Kitchen albuterol (PROVENTIL HFA;VENTOLIN HFA) 108 (90 BASE) MCG/ACT inhaler Inhale 2 puffs into the lungs every 6 (six) hours as needed for wheezing or shortness of breath.  Marland Kitchen alendronate (FOSAMAX) 70 MG tablet Take 70 mg by mouth once a week. Take with a full glass of water on an empty stomach.  Marland Kitchen alum & mag hydroxide-simeth (MAALOX/MYLANTA) 200-200-20 MG/5ML suspension Take 30 mLs by mouth every 4 (four) hours as needed for indigestion or heartburn.   Marland Kitchen amLODipine (NORVASC) 2.5 MG tablet Take 2.5 mg by mouth daily. Hold if SBP <110  . calcium carbonate (OSCAL) 1500 (600 Ca) MG TABS tablet Take 600 mg of elemental calcium by mouth 2 (two) times daily with a meal.  . camphor-menthol (SARNA) lotion Apply 1 application topically daily. To arms and legs. Can also use as needed.  . cetaphil (CETAPHIL) cream Apply 1 application topically daily.  . clobetasol cream (TEMOVATE) 0.94 % Apply 1 application topically 2 (two) times daily as needed.  . donepezil (ARICEPT) 5 MG tablet Take 5 mg by mouth at bedtime.  . famotidine (PEPCID) 10 MG tablet Take 10 mg by mouth at bedtime.  . furosemide (LASIX) 20 MG tablet Take 20 mg by mouth daily.  . Mirabegron ER (MYRBETRIQ) 25 MG TB24 Take 25 mg by mouth daily with breakfast. Prescribed by urology  . nitroGLYCERIN  (NITROSTAT) 0.4 MG SL tablet Place 0.4 mg under the tongue every 5 (five) minutes as needed for chest pain. After taking three times and no relief call MD.  . polyethylene glycol (MIRALAX / GLYCOLAX) packet Take 17 g by mouth daily as needed for moderate constipation.   . potassium chloride (MICRO-K) 10 MEQ CR capsule Take 10 mEq by mouth daily.  Marland Kitchen pyridOXINE (VITAMIN B-6) 100 MG tablet Take 100 mg by mouth daily with breakfast.   . [DISCONTINUED] famotidine (PEPCID) 20 MG tablet Take 10 mg by mouth at bedtime.  . [DISCONTINUED] furosemide (LASIX) 20 MG tablet Take 0.5 tablets (10 mg total) by mouth daily. (Patient taking differently: Take 20 mg by mouth daily. )   No facility-administered encounter medications on file as of 02/03/2018.     Review of Systems  Vitals:   02/03/18 0921  BP: (!) 160/75  Pulse: 79  Resp: (!) 22  Temp: (!) 97.3 F (36.3 C)  SpO2: 97%  Weight: 155 lb 3.2 oz (70.4 kg)  Height: 5\' 5"  (1.651 m)   Body mass index is 25.83 kg/m. Physical Exam  Labs reviewed: Basic Metabolic Panel: Recent Labs    07/13/17 09/21/17 11/23/17  NA 141 143 141  K 3.6 3.5 3.9  CL  --  111 108  CO2  --  28 27  BUN 17 15 16   CREATININE 0.8 0.8 0.8  CALCIUM  --  9.3 9.4  MG  --   --  1.9   Liver Function Tests: Recent Labs    03/09/17 06/24/17 11/23/17  AST 25 25 17   ALT 23  --  15  ALKPHOS 61 47 50  PROT  --   --  5.4  ALBUMIN  --   --  3.2   No results for input(s): LIPASE, AMYLASE in the last 8760 hours. No results for  input(s): AMMONIA in the last 8760 hours. CBC: Recent Labs    03/09/17 07/13/17 11/23/17  WBC 8.6 7.4 7.3  HGB 15.0 12.4 12.7  HCT 43 36 37  PLT 392 385 347   Cardiac Enzymes: No results for input(s): CKTOTAL, CKMB, CKMBINDEX, TROPONINI in the last 8760 hours. BNP: Invalid input(s): POCBNP Lab Results  Component Value Date   HGBA1C 4.4 11/14/2015   Lab Results  Component Value Date   TSH 1.90 06/24/2017   Lab Results  Component  Value Date   VITAMINB12 260 06/24/2017   No results found for: FOLATE No results found for: IRON, TIBC, FERRITIN  Imaging and Procedures obtained prior to SNF admission: Dg Finger Ring Right  Result Date: 10/16/2016 CLINICAL DATA:  Laceration after finger caught in wheelchair EXAM: RIGHT FOURTH FINGER 2+V COMPARISON:  None. FINDINGS: Frontal, oblique, and lateral views were obtained. There are fractures of the mid the distal aspects of the fourth proximal phalanx with alignment near anatomic at fracture sites. There is soft tissue swelling in this area. No other fractures. No dislocations. There is slight narrowing of the fourth PIP and DIP joints. IMPRESSION: Fractures of the mid the distal aspects of the fourth proximal phalanx with soft tissue swelling. Alignment near anatomic. No other fractures. No dislocation. Mild narrowing of the PIP and DIP joints of the fourth digit. Electronically Signed   By: Lowella Grip III M.D.   On: 10/16/2016 19:44    Assessment/Plan There are no diagnoses linked to this encounter.   Family/ staff Communication:   Labs/tests ordered:  .smmsig This encounter was created in error - please disregard.

## 2018-02-07 ENCOUNTER — Encounter: Payer: Self-pay | Admitting: Family Medicine

## 2018-02-07 ENCOUNTER — Non-Acute Institutional Stay (SKILLED_NURSING_FACILITY): Payer: Medicare Other | Admitting: Family Medicine

## 2018-02-07 DIAGNOSIS — I739 Peripheral vascular disease, unspecified: Secondary | ICD-10-CM

## 2018-02-07 DIAGNOSIS — J410 Simple chronic bronchitis: Secondary | ICD-10-CM | POA: Diagnosis not present

## 2018-02-07 DIAGNOSIS — K219 Gastro-esophageal reflux disease without esophagitis: Secondary | ICD-10-CM

## 2018-02-07 DIAGNOSIS — F0390 Unspecified dementia without behavioral disturbance: Secondary | ICD-10-CM | POA: Diagnosis not present

## 2018-02-07 DIAGNOSIS — I10 Essential (primary) hypertension: Secondary | ICD-10-CM

## 2018-02-07 DIAGNOSIS — F03A0 Unspecified dementia, mild, without behavioral disturbance, psychotic disturbance, mood disturbance, and anxiety: Secondary | ICD-10-CM

## 2018-02-07 NOTE — Progress Notes (Signed)
Provider:  Alain Honey, MD Location:  [facility] Room Number: 3 Place of Service:  SNF (31)  PCP: [name], [name], NP Patient Care Team: [name], [name], NP as PCP - General (Internal Medicine) [name], [name], NP as Nurse Practitioner (Nurse Practitioner)  Extended Emergency Contact Information Primary Emergency Contact: [name] Address: [place], [place] [address] [name] of [place] Phone: [phone] Work Phone: [phone] Mobile Phone: [phone] Relation: Daughter Secondary Emergency Contact: [name] Address: [address] [place] [address] [name] of [place] Phone: [phone] Mobile Phone: [phone] Relation: Son  Code Status: DNR Goals of Care: Advanced Directive information Advanced Directives 01/27/2018  Does Patient Have a Medical Advance Directive? Yes  Type of Paramedic of [place];Out of facility DNR (pink MOST or yellow form)  Does patient want to make changes to medical advance directive? No - Patient declined  Copy of [place] in Chart? Yes  Pre-existing out of facility DNR order (yellow form or pink MOST form) Yellow form placed in chart (order not valid for inpatient use)      Chief Complaint  Patient presents with  . Medical Management of Chronic Issues    HPI: Patient is a 84 y.o. female seen today for medical management of chronic problems including: Unspecified dementia, hypertension, gastroesophageal reflux disease, and peripheral vascular disease. Patient was recently seen for advanced care planning when I met with her daughter and son to discuss the most form.  Patient does fairly well in this environment.  She is a 1 person assist with activities of daily living.  She eats 75 to 100% of her meals.  Blood pressure has been stable.  Weight and appetite are also stable.  There have been no recent  falls.  Past Medical History:  Diagnosis Date  . Abnormality of gait 04/28/2010   History of fall onto right shoulder-has participated in PT. Uses walker.     . Acute encephalopathy 10/25/14   Occurred when septic  . Acute lower GI bleeding 10/30/2014  . Adjustment disorder with mixed anxiety and depressed mood 06/04/2015   11/15/15 Hgb 12.8, Na 139, K 4.2, Bun 14, creat 0.68, TP 5.7, albumin 3.5, TSH 1.54, Hgb a1c 4.4   . Anemia   . Aortic atherosclerosis ([place]) 10/27/2014  . Asthma   . Cerebral atrophy ([place]) 10/27/2014  . Cerebrovascular disease 10/27/2014   Small vessel disease  . Cervical disc disease   . Chronic lower back pain 06/04/2015   lumbar spondylosis. Regular f/u Dr. French Ana. Receives injections last 01/2013.  05/27/15 Xray left hip/pelvis: spondylotic changes, severe osteoarthritis Continue Tylenol and Tramadol prn for pain, observe the patient, adding Cymbalta may help. Mid to lower back pain more on the right side back, may consider X-ray of thoracic spine. 06/01/15 CT thoracic spine showed no acute changes.  Patient received 10 injection in the back through Dr. Alvester Morin office. She says the previous problems of discomfort in the back and down the leg have completely resolved. Takes Aleve '220mg'$  bid   . Debility 06/20/2015   11/15/15 Hgb 12.8, Na 139, K 4.2, Bun 14, creat 0.68, TP 5.7, albumin 3.5, TSH 1.54, Hgb a1c 4.4   . Degenerative disc disease, lumbar 10/27/2014  . Depression   . Dermatitis 03/25/2016  . Diarrhea 10/25/14  . Diverticulosis 10/27/2014  .  Essential hypertension 09/20/2006   11/15/15 Hgb 12.8, Na 139, K 4.2, Bun 14, creat 0.68, TP 5.7, albumin 3.5, TSH 1.54, Hgb a1c 4.4   . Fall at nursing home 10/25/2014  . GERD (gastroesophageal reflux disease)   . History of cardiovascular disorder 09/20/2006   X 3 in 2000 now on aspirin.    Marland Kitchen Hyperlipidemia 09/20/2006   05/15/14 LDL 95   . Hypertension   . Lumbosacral spondylosis 10/28/2009  . OSTEOPOROSIS 09/20/2006   Noted 2008      . Overactive bladder 11/29/2013   Myrbetriq prescribed by urology.    . Protein calorie malnutrition ([place])    06/06/15 TP 6.0 albumin 2.9  . SCOLIOSIS 10/28/2009  . Scoliosis 10/27/2014  . Thrombocytosis ([place]) 05/31/2015   06/10/15 plt 602 07/23/15 plt 335 11/15/15 Hgb 12.8, Na 139, K 4.2, Bun 14, creat 0.68, TP 5.7, albumin 3.5, TSH 1.54, Hgb a1c 4.4  . TIA (transient ischemic attack) 09/20/2006   Past Surgical History:  Procedure Laterality Date  . ABDOMINAL HYSTERECTOMY    . APPENDECTOMY    . CHOLECYSTECTOMY    . TONSILLECTOMY      reports that she has never smoked. She has never used smokeless tobacco. She reports that she does not drink alcohol or use drugs. Social History   Socioeconomic History  . Marital status: Widowed    Spouse name: Not on file  . Number of children: Not on file  . Years of education: Not on file  . Highest education level: Not on file  Occupational History  . Occupation: retired Tour manager  . Financial resource strain: Not hard at all  . Food insecurity:    Worry: Never true    Inability: Never true  . Transportation needs:    Medical: No    Non-medical: No  Tobacco Use  . Smoking status: Never Smoker  . Smokeless tobacco: Never Used  Substance and Sexual Activity  . Alcohol use: No  . Drug use: No  . Sexual activity: Never  Lifestyle  . Physical activity:    Days per week: 0 days    Minutes per session: 0 min  . Stress: Not at all  Relationships  . Social connections:    Talks on phone: Never    Gets together: Once a week    Attends religious service: Never    Active member of club or organization: No    Attends meetings of clubs or organizations: Never    Relationship status: Widowed  . Intimate partner violence:    Fear of current or ex partner: No    Emotionally abused: No    Physically abused: No    Forced sexual activity: No  Other Topics Concern  . Not on file  Social History Narrative   Widowed from 2nd husband in  2000.    Lives alone at [facility] [place], moved to [place] 01/15/14   Hobbies: crossword puzzles, used to knit   Never smoked   Alcohol none   Caffeine none   Exercise walking   Walks with walker   DNR, POA, Living Will    Functional Status Survey:    Family History  Problem Relation Age of Onset  . Pancreatic cancer Father 34       deceased  . Heart failure Mother 104       deceased  . Leukemia Brother 76       deceased  . Diabetes type II Unknown   . Alcohol  abuse Unknown     Health Maintenance  Topic Date Due  . TETANUS/TDAP  08/23/2021  . INFLUENZA VACCINE  Completed  . DEXA SCAN  Completed  . PNA vac Low Risk Adult  Completed    Allergies  Allergen Reactions  . Tramadol Other (See Comments)    Hallucinations, agitation/combativeness  . Amoxicillin     Per MAR  . Oxycodone Other (See Comments)    Per MAR   . Sulfamethoxazole     Per MAR   . Penicillins Rash    Outpatient Encounter Medications as of 02/07/2018  Medication Sig  . acetaminophen (TYLENOL) 325 MG tablet Take 650 mg by mouth every 6 (six) hours as needed.  Marland Kitchen albuterol (PROVENTIL HFA;VENTOLIN HFA) 108 (90 BASE) MCG/ACT inhaler Inhale 2 puffs into the lungs every 6 (six) hours as needed for wheezing or shortness of breath.  Marland Kitchen alendronate (FOSAMAX) 70 MG tablet Take 70 mg by mouth once a week. Take with a full glass of water on an empty stomach.  Marland Kitchen alum & mag hydroxide-simeth (MAALOX/MYLANTA) 200-200-20 MG/5ML suspension Take 30 mLs by mouth every 4 (four) hours as needed for indigestion or heartburn.   Marland Kitchen amLODipine (NORVASC) 2.5 MG tablet Take 2.5 mg by mouth daily. Hold if SBP <110  . calcium carbonate (OSCAL) 1500 (600 Ca) MG TABS tablet Take 600 mg of elemental calcium by mouth 2 (two) times daily with a meal.  . camphor-menthol (SARNA) lotion Apply 1 application topically daily. To arms and legs. Can also use as needed.  . cetaphil (CETAPHIL) cream Apply 1 application topically daily.  .  clobetasol cream (TEMOVATE) 7.32 % Apply 1 application topically 2 (two) times daily as needed.  . donepezil (ARICEPT) 5 MG tablet Take 5 mg by mouth at bedtime.  . famotidine (PEPCID) 10 MG tablet Take 10 mg by mouth at bedtime.  . furosemide (LASIX) 20 MG tablet Take 20 mg by mouth daily.  Marland Kitchen losartan (COZAAR) 100 MG tablet Take 100 mg by mouth daily.  . Mirabegron ER (MYRBETRIQ) 25 MG TB24 Take 25 mg by mouth daily with breakfast. Prescribed by urology  . nitroGLYCERIN (NITROSTAT) 0.4 MG SL tablet Place 0.4 mg under the tongue every 5 (five) minutes as needed for chest pain. After taking three times and no relief call MD.  . polyethylene glycol (MIRALAX / GLYCOLAX) packet Take 17 g by mouth daily as needed for moderate constipation.   . potassium chloride (MICRO-K) 10 MEQ CR capsule Take 10 mEq by mouth daily.  Marland Kitchen pyridOXINE (VITAMIN B-6) 100 MG tablet Take 100 mg by mouth daily with breakfast.    No facility-administered encounter medications on file as of 02/07/2018.     Review of Systems  Constitutional: Negative.   HENT: Negative.   Eyes: Negative.   Respiratory: Negative.   Cardiovascular: Negative.   Genitourinary: Negative.   Musculoskeletal: Positive for back pain and gait problem.  Psychiatric/Behavioral: Positive for confusion.    Vitals:   02/07/18 1324  BP: 130/70  Pulse: 79  Resp: (!) 22  Temp: (!) 97.3 F (36.3 C)  SpO2: 97%  Weight: 155 lb 3.2 oz (70.4 kg)  Height: '5\' 5"'$  (1.651 m)   Body mass index is 25.83 kg/m. Physical Exam  Constitutional: She appears well-developed and well-nourished.  HENT:  Head: Normocephalic.  Mouth/Throat: Oropharynx is clear and moist.  Cardiovascular: Normal rate, regular rhythm and normal heart sounds.  Pulmonary/Chest: Effort normal and breath sounds normal.  Abdominal: Soft. Bowel sounds are  normal.  Musculoskeletal: Normal range of motion.  Moves all extremities and strength is appropriate.  Ambulates in wheelchair    Neurological: She is alert.  Oriented to person  Skin: Skin is warm.  Nursing note and vitals reviewed.   Labs reviewed: Basic Metabolic Panel: Recent Labs    07/13/17 09/21/17 11/23/17  NA 141 143 141  K 3.6 3.5 3.9  CL  --  111 108  CO2  --  28 27  BUN '17 15 16  '$ CREATININE 0.8 0.8 0.8  CALCIUM  --  9.3 9.4  MG  --   --  1.9   Liver Function Tests: Recent Labs    03/09/17 06/24/17 11/23/17  AST '25 25 17  '$ ALT 23  --  15  ALKPHOS 61 47 50  PROT  --   --  5.4  ALBUMIN  --   --  3.2   No results for input(s): LIPASE, AMYLASE in the last 8760 hours. No results for input(s): AMMONIA in the last 8760 hours. CBC: Recent Labs    03/09/17 07/13/17 11/23/17  WBC 8.6 7.4 7.3  HGB 15.0 12.4 12.7  HCT 43 36 37  PLT 392 385 347   Cardiac Enzymes: No results for input(s): CKTOTAL, CKMB, CKMBINDEX, TROPONINI in the last 8760 hours. BNP: Invalid input(s): POCBNP Lab Results  Component Value Date   HGBA1C 4.4 11/14/2015   Lab Results  Component Value Date   TSH 1.90 06/24/2017   Lab Results  Component Value Date   VITAMINB12 260 06/24/2017   No results found for: FOLATE No results found for: IRON, TIBC, FERRITIN  Imaging and Procedures obtained prior to SNF admission: Dg Finger Ring Right  Result Date: 10/16/2016 CLINICAL DATA:  Laceration after finger caught in wheelchair EXAM: RIGHT FOURTH FINGER 2+V COMPARISON:  None. FINDINGS: Frontal, oblique, and lateral views were obtained. There are fractures of the mid the distal aspects of the fourth proximal phalanx with alignment near anatomic at fracture sites. There is soft tissue swelling in this area. No other fractures. No dislocations. There is slight narrowing of the fourth PIP and DIP joints. IMPRESSION: Fractures of the mid the distal aspects of the fourth proximal phalanx with soft tissue swelling. Alignment near anatomic. No other fractures. No dislocation. Mild narrowing of the PIP and DIP joints of the fourth digit.  Electronically Signed   By: Lowella Grip III M.D.   On: 10/16/2016 19:44    Assessment/Plan 1. Essential hypertension Blood pressures have been well controlled on low-dose amlodipine, 2.5 mg  2. PVD (peripheral vascular disease) (HCC) No current edema.  Continue with furosemide  3. Simple chronic bronchitis (HCC) No current cough.  Previous diagnosis of bronchitis has resolved  4. Gastroesophageal reflux disease without esophagitis Denies symptoms.  Continue with famotidine  5. Mild dementia ([place]) Dementia seems moderate.  Patient oriented only to self.  She did complain of loose stools today and family had previously consented to discontinue donepezil, which may be a contributor to the symptom   Family/ staff Communication: Findings communicated to staff and family  Labs/tests ordered: None  Lillette Boxer. Sabra Heck, [place] [address] [place], [place] Office [phone]

## 2018-02-08 DIAGNOSIS — K219 Gastro-esophageal reflux disease without esophagitis: Secondary | ICD-10-CM | POA: Diagnosis not present

## 2018-02-08 DIAGNOSIS — I10 Essential (primary) hypertension: Secondary | ICD-10-CM | POA: Diagnosis not present

## 2018-02-08 DIAGNOSIS — H81399 Other peripheral vertigo, unspecified ear: Secondary | ICD-10-CM | POA: Diagnosis not present

## 2018-02-08 DIAGNOSIS — R293 Abnormal posture: Secondary | ICD-10-CM | POA: Diagnosis not present

## 2018-02-08 DIAGNOSIS — M6281 Muscle weakness (generalized): Secondary | ICD-10-CM | POA: Diagnosis not present

## 2018-02-08 DIAGNOSIS — R278 Other lack of coordination: Secondary | ICD-10-CM | POA: Diagnosis not present

## 2018-02-09 DIAGNOSIS — K219 Gastro-esophageal reflux disease without esophagitis: Secondary | ICD-10-CM | POA: Diagnosis not present

## 2018-02-09 DIAGNOSIS — M6281 Muscle weakness (generalized): Secondary | ICD-10-CM | POA: Diagnosis not present

## 2018-02-09 DIAGNOSIS — H81399 Other peripheral vertigo, unspecified ear: Secondary | ICD-10-CM | POA: Diagnosis not present

## 2018-02-09 DIAGNOSIS — R293 Abnormal posture: Secondary | ICD-10-CM | POA: Diagnosis not present

## 2018-02-09 DIAGNOSIS — I10 Essential (primary) hypertension: Secondary | ICD-10-CM | POA: Diagnosis not present

## 2018-02-09 DIAGNOSIS — R278 Other lack of coordination: Secondary | ICD-10-CM | POA: Diagnosis not present

## 2018-02-10 DIAGNOSIS — K219 Gastro-esophageal reflux disease without esophagitis: Secondary | ICD-10-CM | POA: Diagnosis not present

## 2018-02-10 DIAGNOSIS — M6281 Muscle weakness (generalized): Secondary | ICD-10-CM | POA: Diagnosis not present

## 2018-02-10 DIAGNOSIS — R278 Other lack of coordination: Secondary | ICD-10-CM | POA: Diagnosis not present

## 2018-02-10 DIAGNOSIS — I10 Essential (primary) hypertension: Secondary | ICD-10-CM | POA: Diagnosis not present

## 2018-02-10 DIAGNOSIS — R293 Abnormal posture: Secondary | ICD-10-CM | POA: Diagnosis not present

## 2018-02-10 DIAGNOSIS — H81399 Other peripheral vertigo, unspecified ear: Secondary | ICD-10-CM | POA: Diagnosis not present

## 2018-03-07 ENCOUNTER — Encounter: Payer: Self-pay | Admitting: Nurse Practitioner

## 2018-03-07 ENCOUNTER — Non-Acute Institutional Stay (SKILLED_NURSING_FACILITY): Payer: Medicare Other | Admitting: Nurse Practitioner

## 2018-03-07 DIAGNOSIS — M150 Primary generalized (osteo)arthritis: Secondary | ICD-10-CM

## 2018-03-07 DIAGNOSIS — N3281 Overactive bladder: Secondary | ICD-10-CM

## 2018-03-07 DIAGNOSIS — F0390 Unspecified dementia without behavioral disturbance: Secondary | ICD-10-CM | POA: Diagnosis not present

## 2018-03-07 DIAGNOSIS — M159 Polyosteoarthritis, unspecified: Secondary | ICD-10-CM

## 2018-03-07 DIAGNOSIS — I739 Peripheral vascular disease, unspecified: Secondary | ICD-10-CM

## 2018-03-07 DIAGNOSIS — I10 Essential (primary) hypertension: Secondary | ICD-10-CM

## 2018-03-07 DIAGNOSIS — K219 Gastro-esophageal reflux disease without esophagitis: Secondary | ICD-10-CM

## 2018-03-07 DIAGNOSIS — F03A0 Unspecified dementia, mild, without behavioral disturbance, psychotic disturbance, mood disturbance, and anxiety: Secondary | ICD-10-CM

## 2018-03-07 DIAGNOSIS — M1990 Unspecified osteoarthritis, unspecified site: Secondary | ICD-10-CM | POA: Insufficient documentation

## 2018-03-07 NOTE — Assessment & Plan Note (Signed)
Chronic trace edema BLE, no open wounds. Continue Furosemide 10mg  qd.

## 2018-03-07 NOTE — Assessment & Plan Note (Signed)
No urinary retention, continue Myrbetriq 25mg qd.  

## 2018-03-07 NOTE — Assessment & Plan Note (Signed)
Right leg in general, will schedule Tylenol 500mg  tid po. May consider Ortho referral if no better.

## 2018-03-07 NOTE — Progress Notes (Signed)
Location:  [facility] Room Number: 3 Place of Service:  SNF (31) Provider:  Marlana Latus  NP  [name], [name], NP  Patient Care Team: [name], [name], NP as PCP - General (Internal Medicine) [name], [name], NP as Nurse Practitioner (Nurse Practitioner)  Extended Emergency Contact Information Primary Emergency Contact: [place] Address: [place], [place] [address] [name] of [place] Phone: [phone] Work Phone: [phone] Mobile Phone: [phone] Relation: Daughter Secondary Emergency Contact: [name] Address: [address] [place] [address] [name] of [place] Phone: [phone] Mobile Phone: [phone] Relation: Son  Code Status:  DNR Goals of care: Advanced Directive information Advanced Directives 03/07/2018  Does Patient Have a Medical Advance Directive? Yes  Type of Paramedic of [place];Out of facility DNR (pink MOST or yellow form)  Does patient want to make changes to medical advance directive? No - Patient declined  Copy of [place] in Chart? Yes - validated most recent copy scanned in chart (See row information)  Pre-existing out of facility DNR order (yellow form or pink MOST form) Yellow form placed in chart (order not valid for inpatient use)     Chief Complaint  Patient presents with  . Medical Management of Chronic Issues    F/u- HTN, PVD, GERD, dementia. OAB    HPI:  Pt is a 84 y.o. female seen today for medical management of chronic diseases.     The patient resides in SNF [facility] for safety and care assistance, on Donepezil  5mg  qd for memory. Hx of HTN, blood pressure is controlled on Amlodipine 2.5mg  qd, Losartan 100mg  qd. Edema, trace BLE, chronic, on Furosemide 20mg  qd. OAB, no urinary retention, on Mrybetriq 25mg  qd. GERD stable on Famotidine 10mg  qd. OA right leg pain, chronic, prn Tylenol 650mg  q6h prn  is not adequate since the patient doesn't always remember to request it.  Past Medical History:  Diagnosis Date  . Abnormality of gait 04/28/2010   History of fall onto right shoulder-has participated in PT. Uses walker.     . Acute encephalopathy 10/25/14   Occurred when septic  . Acute lower GI bleeding 10/30/2014  . Adjustment disorder with mixed anxiety and depressed mood 06/04/2015   11/15/15 Hgb 12.8, Na 139, K 4.2, Bun 14, creat 0.68, TP 5.7, albumin 3.5, TSH 1.54, Hgb a1c 4.4   . Anemia   . Aortic atherosclerosis ([place]) 10/27/2014  . Asthma   . Cerebral atrophy ([place]) 10/27/2014  . Cerebrovascular disease 10/27/2014   Small vessel disease  . Cervical disc disease   . Chronic lower back pain 06/04/2015   lumbar spondylosis. Regular f/u Dr. French Ana. Receives injections last 01/2013.  05/27/15 Xray left hip/pelvis: spondylotic changes, severe osteoarthritis Continue Tylenol and Tramadol prn for pain, observe the patient, adding Cymbalta may help. Mid to lower back pain more on the right side back, may consider X-ray of thoracic spine. 06/01/15 CT thoracic spine showed no acute changes.  Patient received 10 injection in the back through Dr. Alvester Morin office. She says the previous problems of discomfort in the back and down the leg have completely resolved. Takes Aleve 220mg  bid   . Debility 06/20/2015   11/15/15 Hgb 12.8, Na 139, K 4.2, Bun 14, creat 0.68, TP 5.7, albumin 3.5, TSH 1.54, Hgb a1c 4.4   . Degenerative disc  disease, lumbar 10/27/2014  . Depression   . Dermatitis 03/25/2016  . Diarrhea 10/25/14  . Diverticulosis 10/27/2014  . Essential hypertension 09/20/2006   11/15/15 Hgb 12.8, Na 139, K 4.2, Bun 14, creat 0.68, TP 5.7, albumin 3.5, TSH 1.54, Hgb a1c 4.4   . Fall at nursing home 10/25/2014  . GERD (gastroesophageal reflux disease)   . History of cardiovascular disorder 09/20/2006   X 3 in 2000 now on aspirin.    Marland Kitchen Hyperlipidemia 09/20/2006   05/15/14 LDL 95   . Hypertension   . Lumbosacral  spondylosis 10/28/2009  . OSTEOPOROSIS 09/20/2006   Noted 2008    . Overactive bladder 11/29/2013   Myrbetriq prescribed by urology.    . Protein calorie malnutrition ([place])    06/06/15 TP 6.0 albumin 2.9  . SCOLIOSIS 10/28/2009  . Scoliosis 10/27/2014  . Thrombocytosis ([place]) 05/31/2015   06/10/15 plt 602 07/23/15 plt 335 11/15/15 Hgb 12.8, Na 139, K 4.2, Bun 14, creat 0.68, TP 5.7, albumin 3.5, TSH 1.54, Hgb a1c 4.4  . TIA (transient ischemic attack) 09/20/2006   Past Surgical History:  Procedure Laterality Date  . ABDOMINAL HYSTERECTOMY    . APPENDECTOMY    . CHOLECYSTECTOMY    . TONSILLECTOMY      Allergies  Allergen Reactions  . Tramadol Other (See Comments)    Hallucinations, agitation/combativeness  . Amoxicillin     Per MAR  . Oxycodone Other (See Comments)    Per MAR   . Sulfamethoxazole     Per MAR   . Penicillins Rash    Outpatient Encounter Medications as of 03/07/2018  Medication Sig  . acetaminophen (TYLENOL) 325 MG tablet Take 650 mg by mouth every 6 (six) hours as needed.  Marland Kitchen albuterol (PROVENTIL HFA;VENTOLIN HFA) 108 (90 BASE) MCG/ACT inhaler Inhale 2 puffs into the lungs every 6 (six) hours as needed for wheezing or shortness of breath.  Marland Kitchen alendronate (FOSAMAX) 70 MG tablet Take 70 mg by mouth once a week. Take with a full glass of water on an empty stomach.  Marland Kitchen alum & mag hydroxide-simeth (MAALOX/MYLANTA) 200-200-20 MG/5ML suspension Take 30 mLs by mouth every 4 (four) hours as needed for indigestion or heartburn.   Marland Kitchen amLODipine (NORVASC) 2.5 MG tablet Take 2.5 mg by mouth daily. Hold if SBP <110  . calcium carbonate (OSCAL) 1500 (600 Ca) MG TABS tablet Take 600 mg of elemental calcium by mouth 2 (two) times daily with a meal.  . camphor-menthol (SARNA) lotion Apply 1 application topically daily. To arms in am and legs at bedtime . Can also use as needed.  . cetaphil (CETAPHIL) cream Apply 1 application topically daily.  . clobetasol cream (TEMOVATE) 8.93 % Apply 1  application topically 2 (two) times daily as needed.  . donepezil (ARICEPT) 5 MG tablet Take 5 mg by mouth at bedtime.  . famotidine (PEPCID) 10 MG tablet Take 10 mg by mouth at bedtime.  . furosemide (LASIX) 20 MG tablet Take 20 mg by mouth daily.  Marland Kitchen losartan (COZAAR) 100 MG tablet Take 100 mg by mouth daily.  . Mirabegron ER (MYRBETRIQ) 25 MG TB24 Take 25 mg by mouth daily with breakfast. Prescribed by urology  . nitroGLYCERIN (NITROSTAT) 0.4 MG SL tablet Place 0.4 mg under the tongue every 5 (five) minutes as needed for chest pain. After taking three times and no relief call MD.  . polyethylene glycol (MIRALAX / GLYCOLAX) packet Take 17 g by mouth daily as needed for moderate constipation.   Marland Kitchen  potassium chloride (MICRO-K) 10 MEQ CR capsule Take 10 mEq by mouth daily.  Marland Kitchen pyridOXINE (VITAMIN B-6) 100 MG tablet Take 100 mg by mouth daily with breakfast.    No facility-administered encounter medications on file as of 03/07/2018.    ROS was provided with assistance of staff Review of Systems  Constitutional: Negative for activity change, appetite change, chills, diaphoresis, fatigue, fever and unexpected weight change.  HENT: Positive for hearing loss. Negative for congestion and voice change.   Respiratory: Negative for cough, shortness of breath and wheezing.   Cardiovascular: Positive for leg swelling. Negative for chest pain and palpitations.  Gastrointestinal: Negative for abdominal distention, abdominal pain, constipation, diarrhea, nausea and vomiting.  Genitourinary: Negative for difficulty urinating, dysuria and urgency.  Musculoskeletal: Positive for arthralgias, back pain and gait problem.  Skin: Negative for color change and pallor.  Neurological: Negative for dizziness, facial asymmetry, speech difficulty, weakness and headaches.       Memory lapses.   Psychiatric/Behavioral: Negative for agitation, behavioral problems, hallucinations and sleep disturbance. The patient is not  nervous/anxious.     Immunization History  Administered Date(s) Administered  . HiB (PRP-OMP) 02/12/2004  . Influenza Split 01/11/2012  . Influenza Whole 01/07/2009, 01/15/2018  . Influenza-Unspecified 01/11/2014, 01/01/2015, 01/29/2016, 02/01/2017  . PPD Test 11/14/2014, 06/03/2015, 06/17/2015  . Pneumococcal Conjugate-13 02/08/2017  . Pneumococcal Polysaccharide-23 04/28/2010  . Tdap 08/24/2011  . Zoster 04/13/2010   Pertinent  Health Maintenance Due  Topic Date Due  . INFLUENZA VACCINE  Completed  . DEXA SCAN  Completed  . PNA vac Low Risk Adult  Completed   Fall Risk  01/04/2018 12/29/2016 11/02/2016 06/20/2015 05/23/2015  Falls in the past year? No No Yes No No  Comment - - Emmi Telephone Survey: data to providers prior to load - -  Number falls in past yr: - - 2 or more - -  Comment - - Emmi Telephone Survey Actual Response = 90 - -  Injury with Fall? - - Yes - -   Functional Status Survey:    Vitals:   03/07/18 1249  BP: (!) 164/70  Pulse: 76  Resp: (!) 21  Temp: 98.3 F (36.8 C)  SpO2: 97%  Weight: 158 lb (71.7 kg)  Height: 5\' 4"  (1.626 m)   Body mass index is 27.12 kg/m. Physical Exam  Constitutional: She appears well-developed and well-nourished.  HENT:  Head: Normocephalic and atraumatic.  Eyes: Pupils are equal, round, and reactive to light. EOM are normal.  Neck: Normal range of motion. Neck supple. No JVD present. No thyromegaly present.  Cardiovascular: Normal rate and regular rhythm.  No murmur heard. Pulmonary/Chest: Effort normal. She has no wheezes. She has no rales.  Abdominal: Soft. She exhibits no distension. There is no tenderness. There is no rebound and no guarding.  Musculoskeletal: She exhibits edema.  Trace edema BLE. SBA or limited assist for transfer. W/c for mobility.   Neurological: She is alert. No cranial nerve deficit. She exhibits normal muscle tone. Coordination normal.  Oriented to person and place.   Skin: Skin is warm and dry.   Psychiatric: She has a normal mood and affect. Her behavior is normal.    Labs reviewed: Recent Labs    07/13/17 09/21/17 11/23/17  NA 141 143 141  K 3.6 3.5 3.9  CL  --  111 108  CO2  --  28 27  BUN 17 15 16   CREATININE 0.8 0.8 0.8  CALCIUM  --  9.3 9.4  MG  --   --  1.9   Recent Labs    03/09/17 06/24/17 11/23/17  AST 25 25 17   ALT 23  --  15  ALKPHOS 61 47 50  PROT  --   --  5.4  ALBUMIN  --   --  3.2   Recent Labs    03/09/17 07/13/17 11/23/17  WBC 8.6 7.4 7.3  HGB 15.0 12.4 12.7  HCT 43 36 37  PLT 392 385 347   Lab Results  Component Value Date   TSH 1.90 06/24/2017   Lab Results  Component Value Date   HGBA1C 4.4 11/14/2015   Lab Results  Component Value Date   CHOL 176 05/15/2014   HDL 62 05/15/2014   LDLCALC 95 05/15/2014   LDLDIRECT 114.3 12/08/2007   TRIG 93 05/15/2014   CHOLHDL 2.9 CALC 12/08/2007    Significant Diagnostic Results in last 30 days:  No results found.  Assessment/Plan PVD (peripheral vascular disease) (HCC) Chronic trace edema BLE, no open wounds. Continue Furosemide 10mg  qd.   GERD (gastroesophageal reflux disease) Stable, continue Famotidine 10mg  qd.   Mild dementia Continue SNF [facility] for safety and care assistance. Continue Donepezil 5mg  qd.   Overactive bladder No urinary retention, continue Myrbetriq 25mg  qd.   Essential hypertension Blood pressure is controlled, continue Amlodipine 2.5mg  qd, Losartan 100mg  qd.   Osteoarthritis (arthritis due to wear and tear of joints) Right leg in general, will schedule Tylenol 500mg  tid po. May consider Ortho referral if no better.      Family/ staff Communication: plan of care reviewed with the patient and charge nurse.   Labs/tests ordered:  none  Time spend 25 minutes.

## 2018-03-07 NOTE — Assessment & Plan Note (Signed)
Blood pressure is controlled, continue Amlodipine 2.5mg  qd, Losartan 100mg  qd.

## 2018-03-07 NOTE — Assessment & Plan Note (Signed)
Continue SNF [facility] for safety and care assistance. Continue Donepezil 5mg  qd.

## 2018-03-07 NOTE — Assessment & Plan Note (Signed)
Stable, continue Famotidine 10mg qd.  

## 2018-03-15 DIAGNOSIS — M25551 Pain in right hip: Secondary | ICD-10-CM | POA: Diagnosis not present

## 2018-04-04 ENCOUNTER — Encounter: Payer: Self-pay | Admitting: Nurse Practitioner

## 2018-04-04 ENCOUNTER — Non-Acute Institutional Stay (SKILLED_NURSING_FACILITY): Payer: Medicare Other | Admitting: Nurse Practitioner

## 2018-04-04 DIAGNOSIS — I10 Essential (primary) hypertension: Secondary | ICD-10-CM | POA: Diagnosis not present

## 2018-04-04 DIAGNOSIS — M159 Polyosteoarthritis, unspecified: Secondary | ICD-10-CM

## 2018-04-04 DIAGNOSIS — M150 Primary generalized (osteo)arthritis: Secondary | ICD-10-CM

## 2018-04-04 DIAGNOSIS — K219 Gastro-esophageal reflux disease without esophagitis: Secondary | ICD-10-CM

## 2018-04-04 DIAGNOSIS — F03A0 Unspecified dementia, mild, without behavioral disturbance, psychotic disturbance, mood disturbance, and anxiety: Secondary | ICD-10-CM

## 2018-04-04 DIAGNOSIS — F0390 Unspecified dementia without behavioral disturbance: Secondary | ICD-10-CM | POA: Diagnosis not present

## 2018-04-04 DIAGNOSIS — I739 Peripheral vascular disease, unspecified: Secondary | ICD-10-CM

## 2018-04-04 DIAGNOSIS — N3281 Overactive bladder: Secondary | ICD-10-CM | POA: Diagnosis not present

## 2018-04-04 NOTE — Assessment & Plan Note (Signed)
Chronic trace edema BLE remains no change, continue Furosemide 10mg  qd.

## 2018-04-04 NOTE — Assessment & Plan Note (Signed)
Blood pressure is controlled, continue Losartan 100mg qd, Amlodipine 2.5mg qd.  

## 2018-04-04 NOTE — Assessment & Plan Note (Signed)
Episodes of stress incontinence noted, continue Myrbetriq 25mg  qd.

## 2018-04-04 NOTE — Progress Notes (Signed)
Location:  [facility] Room Number: 3 Place of Service:  SNF (31) Provider:  Marlana Latus  NP  Allon Costlow X, NP  Patient Care Team: Odalis Jordan X, NP as PCP - General (Internal Medicine) Ruhaan Nordahl X, NP as Nurse Practitioner (Nurse Practitioner)  Extended Emergency Contact Information Primary Emergency Contact: [place] Address: [place], [place] [address] [name] of [place] Phone: [phone] Work Phone: [phone] Mobile Phone: [phone] Relation: Daughter Secondary Emergency Contact: [name] Address: [address] [place] [address] [name] of [place] Phone: [phone] Mobile Phone: [phone] Relation: Son  Code Status:  DNR Goals of care: Advanced Directive information Advanced Directives 04/04/2018  Does Patient Have a Medical Advance Directive? Yes  Type of Paramedic of [place];Out of facility DNR (pink MOST or yellow form);Living will  Does patient want to make changes to medical advance directive? No - Patient declined  Copy of [place] in Chart? Yes - validated most recent copy scanned in chart (See row information)  Pre-existing out of facility DNR order (yellow form or pink MOST form) Yellow form placed in chart (order not valid for inpatient use)     Chief Complaint  Patient presents with  . Medical Management of Chronic Issues    HPI:  Pt is a 84 y.o. female seen today for medical management of chronic diseases.     The patient has history of lower back pain with movement,  takes Tylenol 500mg  tid. HTN, blood pressure is controlled on Amlodipine 2.5mg  qd, Losartan 100mg  qd. Chronic edema BLE, remains no change, on Furosemide 20mg  qd. Urinary frequency is stable, on Myrbetriq 25mg  qd. GERD stable on Famotidine 10mg  qd. She resides in SNF [facility] for safety and care assitance, on Donepezil 5mg  qd for memory.    Past Medical History:  Diagnosis Date  . Abnormality of gait 04/28/2010   History of fall onto right shoulder-has participated in PT. Uses walker.     . Acute encephalopathy 10/25/14   Occurred when septic  . Acute lower GI bleeding 10/30/2014  . Adjustment disorder with mixed anxiety and depressed mood 06/04/2015   11/15/15 Hgb 12.8, Na 139, K 4.2, Bun 14, creat 0.68, TP 5.7, albumin 3.5, TSH 1.54, Hgb a1c 4.4   . Anemia   . Aortic atherosclerosis ([place]) 10/27/2014  . Asthma   . Cerebral atrophy ([place]) 10/27/2014  . Cerebrovascular disease 10/27/2014   Small vessel disease  . Cervical disc disease   . Chronic lower back pain 06/04/2015   lumbar spondylosis. Regular f/u Dr. French Ana. Receives injections last 01/2013.  05/27/15 Xray left hip/pelvis: spondylotic changes, severe osteoarthritis Continue Tylenol and Tramadol prn for pain, observe the patient, adding Cymbalta may help. Mid to lower back pain more on the right side back, may consider X-ray of thoracic spine. 06/01/15 CT thoracic spine showed no acute changes.  Patient received 10 injection in the back through Dr. Alvester Morin office. She says the previous problems of discomfort in the back and down the leg have completely resolved. Takes Aleve 220mg  bid   . Debility 06/20/2015   11/15/15 Hgb 12.8, Na 139, K 4.2, Bun 14, creat 0.68, TP 5.7, albumin 3.5, TSH 1.54, Hgb a1c 4.4   . Degenerative disc disease, lumbar 10/27/2014  . Depression   . Dermatitis 03/25/2016  . Diarrhea 10/25/14  .  Diverticulosis 10/27/2014  . Essential hypertension 09/20/2006   11/15/15 Hgb 12.8, Na 139, K 4.2, Bun 14, creat 0.68, TP 5.7, albumin 3.5, TSH 1.54, Hgb a1c 4.4   . Fall at nursing home 10/25/2014  . GERD (gastroesophageal reflux disease)   . History of cardiovascular disorder 09/20/2006   X 3 in 2000 now on aspirin.    Marland Kitchen Hyperlipidemia 09/20/2006   05/15/14 LDL 95   . Hypertension   . Lumbosacral spondylosis 10/28/2009  . OSTEOPOROSIS 09/20/2006   Noted 2008    .  Overactive bladder 11/29/2013   Myrbetriq prescribed by urology.    . Protein calorie malnutrition ([place])    06/06/15 TP 6.0 albumin 2.9  . SCOLIOSIS 10/28/2009  . Scoliosis 10/27/2014  . Thrombocytosis ([place]) 05/31/2015   06/10/15 plt 602 07/23/15 plt 335 11/15/15 Hgb 12.8, Na 139, K 4.2, Bun 14, creat 0.68, TP 5.7, albumin 3.5, TSH 1.54, Hgb a1c 4.4  . TIA (transient ischemic attack) 09/20/2006   Past Surgical History:  Procedure Laterality Date  . ABDOMINAL HYSTERECTOMY    . APPENDECTOMY    . CHOLECYSTECTOMY    . TONSILLECTOMY      Allergies  Allergen Reactions  . Tramadol Other (See Comments)    Hallucinations, agitation/combativeness  . Amoxicillin     Per MAR  . Oxycodone Other (See Comments)    Per MAR   . Sulfamethoxazole     Per MAR   . Penicillins Rash    Outpatient Encounter Medications as of 04/04/2018  Medication Sig  . acetaminophen (TYLENOL) 500 MG tablet Take 500 mg by mouth 3 (three) times daily.  Marland Kitchen albuterol (PROVENTIL HFA;VENTOLIN HFA) 108 (90 BASE) MCG/ACT inhaler Inhale 2 puffs into the lungs every 6 (six) hours as needed for wheezing or shortness of breath.  Marland Kitchen alendronate (FOSAMAX) 70 MG tablet Take 70 mg by mouth once a week. Take with a full glass of water on an empty stomach.  Marland Kitchen alum & mag hydroxide-simeth (MAALOX/MYLANTA) 200-200-20 MG/5ML suspension Take 30 mLs by mouth every 4 (four) hours as needed for indigestion or heartburn.   Marland Kitchen amLODipine (NORVASC) 2.5 MG tablet Take 2.5 mg by mouth daily. Hold if SBP <110  . calcium carbonate (OSCAL) 1500 (600 Ca) MG TABS tablet Take 600 mg of elemental calcium by mouth 2 (two) times daily with a meal.  . camphor-menthol (SARNA) lotion Apply 1 application topically daily. To arms in am and legs at bedtime . Can also use as needed.  . cetaphil (CETAPHIL) cream Apply 1 application topically daily.  . clobetasol cream (TEMOVATE) 0.62 % Apply 1 application topically 2 (two) times daily as needed.  . donepezil (ARICEPT) 5 MG  tablet Take 5 mg by mouth at bedtime.  . famotidine (PEPCID) 10 MG tablet Take 10 mg by mouth at bedtime.  . furosemide (LASIX) 20 MG tablet Take 20 mg by mouth daily.  Marland Kitchen losartan (COZAAR) 100 MG tablet Take 100 mg by mouth daily.  . Mirabegron ER (MYRBETRIQ) 25 MG TB24 Take 25 mg by mouth daily with breakfast. Prescribed by urology  . nitroGLYCERIN (NITROSTAT) 0.4 MG SL tablet Place 0.4 mg under the tongue every 5 (five) minutes as needed for chest pain. After taking three times and no relief call MD.  . polyethylene glycol (MIRALAX / GLYCOLAX) packet Take 17 g by mouth daily as needed for moderate constipation.   . potassium chloride (MICRO-K) 10 MEQ CR capsule Take 10 mEq by mouth daily.  Marland Kitchen pyridOXINE (VITAMIN B-6) 100  MG tablet Take 100 mg by mouth daily with breakfast.   . [DISCONTINUED] acetaminophen (TYLENOL) 325 MG tablet Take 650 mg by mouth every 6 (six) hours as needed.   No facility-administered encounter medications on file as of 04/04/2018.    ROS was provided with assistance of staff Review of Systems  Constitutional: Negative for activity change, appetite change, chills, diaphoresis, fatigue, fever and unexpected weight change.  HENT: Positive for hearing loss. Negative for congestion and voice change.   Respiratory: Negative for cough, shortness of breath and wheezing.   Cardiovascular: Positive for leg swelling. Negative for chest pain and palpitations.  Gastrointestinal: Negative for abdominal distention, abdominal pain, constipation, diarrhea, nausea and vomiting.  Genitourinary: Negative for decreased urine volume, difficulty urinating and dysuria.  Musculoskeletal: Positive for arthralgias, back pain and gait problem.  Skin: Negative for color change and pallor.       Dry skin  Neurological: Negative for dizziness, speech difficulty, weakness and headaches.       Dementia  Psychiatric/Behavioral: Negative for agitation, behavioral problems, hallucinations and sleep  disturbance. The patient is not nervous/anxious.     Immunization History  Administered Date(s) Administered  . HiB (PRP-OMP) 02/12/2004  . Influenza Split 01/11/2012  . Influenza Whole 01/07/2009, 01/15/2018  . Influenza-Unspecified 01/11/2014, 01/01/2015, 01/29/2016, 02/01/2017  . PPD Test 11/14/2014, 06/03/2015, 06/17/2015  . Pneumococcal Conjugate-13 02/08/2017  . Pneumococcal Polysaccharide-23 04/28/2010  . Tdap 08/24/2011  . Zoster 04/13/2010   Pertinent  Health Maintenance Due  Topic Date Due  . INFLUENZA VACCINE  Completed  . DEXA SCAN  Completed  . PNA vac Low Risk Adult  Completed   Fall Risk  01/04/2018 12/29/2016 11/02/2016 06/20/2015 05/23/2015  Falls in the past year? No No Yes No No  Comment - - Emmi Telephone Survey: data to providers prior to load - -  Number falls in past yr: - - 2 or more - -  Comment - - Emmi Telephone Survey Actual Response = 90 - -  Injury with Fall? - - Yes - -   Functional Status Survey:    Vitals:   04/04/18 1118  BP: (!) 142/68  Pulse: 72  Resp: (!) 22  Temp: 98 F (36.7 C)  SpO2: 97%  Weight: 159 lb 4.8 oz (72.3 kg)  Height: 5\' 5"  (1.651 m)   Body mass index is 26.51 kg/m. Physical Exam Constitutional:      Appearance: Normal appearance. She is normal weight.  HENT:     Head: Normocephalic and atraumatic.     Nose: Nose normal. No congestion.     Mouth/Throat:     Mouth: Mucous membranes are moist.  Eyes:     Extraocular Movements: Extraocular movements intact.     Pupils: Pupils are equal, round, and reactive to light.  Neck:     Musculoskeletal: Normal range of motion and neck supple.  Cardiovascular:     Rate and Rhythm: Normal rate and regular rhythm.     Heart sounds: No murmur.  Pulmonary:     Effort: Pulmonary effort is normal.     Breath sounds: Normal breath sounds. No wheezing, rhonchi or rales.  Abdominal:     General: There is no distension.     Palpations: Abdomen is soft.     Tenderness: There is no  abdominal tenderness. There is no guarding or rebound.  Musculoskeletal:        General: Tenderness present.     Right lower leg: Edema present.  Left lower leg: Edema present.     Comments: Trace edema BLE. Lower back pain with movement. W/c for mobility.   Skin:    General: Skin is warm and dry.     Findings: No rash.     Comments: Dry skin  Neurological:     General: No focal deficit present.     Mental Status: She is alert.     Cranial Nerves: No cranial nerve deficit.     Motor: No weakness.     Coordination: Coordination normal.     Gait: Gait abnormal.     Comments: Oriented to person and place.   Psychiatric:        Mood and Affect: Mood normal.        Behavior: Behavior normal.     Labs reviewed: Recent Labs    07/13/17 09/21/17 11/23/17  NA 141 143 141  K 3.6 3.5 3.9  CL  --  111 108  CO2  --  28 27  BUN 17 15 16   CREATININE 0.8 0.8 0.8  CALCIUM  --  9.3 9.4  MG  --   --  1.9   Recent Labs    06/24/17 11/23/17  AST 25 17  ALT  --  15  ALKPHOS 47 50  PROT  --  5.4  ALBUMIN  --  3.2   Recent Labs    07/13/17 11/23/17  WBC 7.4 7.3  HGB 12.4 12.7  HCT 36 37  PLT 385 347   Lab Results  Component Value Date   TSH 1.90 06/24/2017   Lab Results  Component Value Date   HGBA1C 4.4 11/14/2015   Lab Results  Component Value Date   CHOL 176 05/15/2014   HDL 62 05/15/2014   LDLCALC 95 05/15/2014   LDLDIRECT 114.3 12/08/2007   TRIG 93 05/15/2014   CHOLHDL 2.9 CALC 12/08/2007    Significant Diagnostic Results in last 30 days:  No results found.  Assessment/Plan PVD (peripheral vascular disease) (HCC) Chronic trace edema BLE remains no change, continue Furosemide 10mg  qd.   Essential hypertension Blood pressure is controlled, continue Losartan 100mg  qd, Amlodipine 2.5mg  qd.   GERD (gastroesophageal reflux disease) Stable, continue Famotidine 10mg  qd.   Mild dementia Continue SNF [facility] for safety and care assistance, continue Donoepezil 5mg   qd for memory.   Osteoarthritis (arthritis due to wear and tear of joints) Lower back pain with movement is chronic, continue Tylenol 500mg  tid, will try BioFreeze qid for now.   Overactive bladder Episodes of stress incontinence noted, continue Myrbetriq 25mg  qd.      Family/ staff Communication: plan of care reviewed with the patient and charge nurse.   Labs/tests ordered:  None   Time spend 25 minutes.

## 2018-04-04 NOTE — Assessment & Plan Note (Signed)
Lower back pain with movement is chronic, continue Tylenol 500mg  tid, will try BioFreeze qid for now.

## 2018-04-04 NOTE — Assessment & Plan Note (Signed)
Continue [facility] for safety and care assistance, continue Donoepezil 5mg  qd for memory.

## 2018-04-04 NOTE — Assessment & Plan Note (Signed)
Stable, continue Famotidine 10mg qd.  

## 2018-04-05 ENCOUNTER — Encounter: Payer: Self-pay | Admitting: Nurse Practitioner

## 2018-04-05 DIAGNOSIS — E876 Hypokalemia: Secondary | ICD-10-CM | POA: Insufficient documentation

## 2018-05-13 ENCOUNTER — Encounter: Payer: Self-pay | Admitting: Internal Medicine

## 2018-05-13 ENCOUNTER — Non-Acute Institutional Stay (SKILLED_NURSING_FACILITY): Payer: Medicare Other | Admitting: Internal Medicine

## 2018-05-13 DIAGNOSIS — R32 Unspecified urinary incontinence: Secondary | ICD-10-CM | POA: Insufficient documentation

## 2018-05-13 DIAGNOSIS — R600 Localized edema: Secondary | ICD-10-CM | POA: Diagnosis not present

## 2018-05-13 DIAGNOSIS — M545 Low back pain: Secondary | ICD-10-CM | POA: Diagnosis not present

## 2018-05-13 DIAGNOSIS — G8929 Other chronic pain: Secondary | ICD-10-CM | POA: Diagnosis not present

## 2018-05-13 DIAGNOSIS — I10 Essential (primary) hypertension: Secondary | ICD-10-CM | POA: Diagnosis not present

## 2018-05-13 DIAGNOSIS — F0390 Unspecified dementia without behavioral disturbance: Secondary | ICD-10-CM | POA: Diagnosis not present

## 2018-05-13 DIAGNOSIS — N3946 Mixed incontinence: Secondary | ICD-10-CM

## 2018-05-13 DIAGNOSIS — M810 Age-related osteoporosis without current pathological fracture: Secondary | ICD-10-CM | POA: Diagnosis not present

## 2018-05-13 DIAGNOSIS — F03B0 Unspecified dementia, moderate, without behavioral disturbance, psychotic disturbance, mood disturbance, and anxiety: Secondary | ICD-10-CM

## 2018-05-13 NOTE — Progress Notes (Signed)
Location:  [facility] Room Number: 3 Place of Service:  SNF (31) Provider:   [name], [name], NP  Patient Care Team: [name], [name], NP as PCP - General (Internal Medicine) [name], [name], NP as Nurse Practitioner (Nurse Practitioner)  Extended Emergency Contact Information Primary Emergency Contact: [place] Address: [place], [place] [address] [name] of [place] Phone: [phone] Work Phone: [phone] Mobile Phone: [phone] Relation: Daughter Secondary Emergency Contact: [name] Address: [address] [place] [address] [name] of [place] Phone: [phone] Mobile Phone: [phone] Relation: Son  Code Status:  DNR Goals of care: Advanced Directive information Advanced Directives 05/13/2018  Does Patient Have a Medical Advance Directive? Yes  Type of Paramedic of [place];Out of facility DNR (pink MOST or yellow form)  Does patient want to make changes to medical advance directive? No - Patient declined  Copy of [place] in Chart? Yes - validated most recent copy scanned in chart (See row information)  Pre-existing out of facility DNR order (yellow form or pink MOST form) Yellow form placed in chart (order not valid for inpatient use)     Chief Complaint  Patient presents with  . Medical Management of Chronic Issues    routine visit     HPI:  Pt is a 85 y.o. female seen today for medical management of chronic diseases.    Patient has h/o Hypertension, Bilateral LE edema, Lower Back Pain, Osteoporosis urinary Incontinence and Dementia  Patient unable to give me any detail history but per Nurses she is doing good. But her weight has been slowly going up and she has gained about 4 lbs in past few weeks. Her BP seems to  be persistently high. And swelling in her LE has increased. She does not have any Cough or SOB. She does  not seem to be in any distress.   Past Medical History:  Diagnosis Date  . Abnormality of gait 04/28/2010   History of fall onto right shoulder-has participated in PT. Uses walker.     . Acute encephalopathy 10/25/14   Occurred when septic  . Acute lower GI bleeding 10/30/2014  . Adjustment disorder with mixed anxiety and depressed mood 06/04/2015   11/15/15 Hgb 12.8, Na 139, K 4.2, Bun 14, creat 0.68, TP 5.7, albumin 3.5, TSH 1.54, Hgb a1c 4.4   . Anemia   . Aortic atherosclerosis ([place]) 10/27/2014  . Asthma   . Cerebral atrophy ([place]) 10/27/2014  . Cerebrovascular disease 10/27/2014   Small vessel disease  . Cervical disc disease   . Chronic lower back pain 06/04/2015   lumbar spondylosis. Regular f/u Dr. French Ana. Receives injections last 01/2013.  05/27/15 Xray left hip/pelvis: spondylotic changes, severe osteoarthritis Continue Tylenol and Tramadol prn for pain, observe the patient, adding Cymbalta may help. Mid to lower back pain more on the right side back, may consider X-ray of thoracic spine. 06/01/15 CT thoracic spine showed no acute changes.  Patient received 10 injection in the back through Dr. Alvester Morin office. She says the previous problems of discomfort in the back and down the leg have completely resolved. Takes Aleve 220mg  bid   . Debility 06/20/2015   11/15/15 Hgb 12.8, Na 139, K 4.2, Bun 14, creat 0.68, TP 5.7, albumin 3.5, TSH 1.54, Hgb a1c 4.4   . Degenerative disc disease,  lumbar 10/27/2014  . Depression   . Dermatitis 03/25/2016  . Diarrhea 10/25/14  . Diverticulosis 10/27/2014  . Essential hypertension 09/20/2006   11/15/15 Hgb 12.8, Na 139, K 4.2, Bun 14, creat 0.68, TP 5.7, albumin 3.5, TSH 1.54, Hgb a1c 4.4   . Fall at nursing home 10/25/2014  . GERD (gastroesophageal reflux disease)   . History of cardiovascular disorder 09/20/2006   X 3 in 2000 now on aspirin.    Marland Kitchen Hyperlipidemia 09/20/2006   05/15/14 LDL 95   . Hypertension   . Lumbosacral spondylosis 10/28/2009  .  OSTEOPOROSIS 09/20/2006   Noted 2008    . Overactive bladder 11/29/2013   Myrbetriq prescribed by urology.    . Protein calorie malnutrition ([place])    06/06/15 TP 6.0 albumin 2.9  . SCOLIOSIS 10/28/2009  . Scoliosis 10/27/2014  . Thrombocytosis ([facility]) 05/31/2015   06/10/15 plt 602 07/23/15 plt 335 11/15/15 Hgb 12.8, Na 139, K 4.2, Bun 14, creat 0.68, TP 5.7, albumin 3.5, TSH 1.54, Hgb a1c 4.4  . TIA (transient ischemic attack) 09/20/2006   Past Surgical History:  Procedure Laterality Date  . ABDOMINAL HYSTERECTOMY    . APPENDECTOMY    . CHOLECYSTECTOMY    . TONSILLECTOMY      Allergies  Allergen Reactions  . Tramadol Other (See Comments)    Hallucinations, agitation/combativeness  . Amoxicillin     Per MAR  . Oxycodone Other (See Comments)    Per MAR   . Sulfamethoxazole     Per MAR   . Penicillins Rash    Outpatient Encounter Medications as of 05/13/2018  Medication Sig  . acetaminophen (TYLENOL) 500 MG tablet Take 500 mg by mouth 3 (three) times daily.  Marland Kitchen albuterol (PROVENTIL HFA;VENTOLIN HFA) 108 (90 BASE) MCG/ACT inhaler Inhale 2 puffs into the lungs every 6 (six) hours as needed for wheezing or shortness of breath.  Marland Kitchen alendronate (FOSAMAX) 70 MG tablet Take 70 mg by mouth once a week. Take with a full glass of water on an empty stomach.  Marland Kitchen amLODipine (NORVASC) 2.5 MG tablet Take 2.5 mg by mouth daily. Hold if SBP <110  . calcium carbonate (OSCAL) 1500 (600 Ca) MG TABS tablet Take 600 mg of elemental calcium by mouth 2 (two) times daily with a meal.  . camphor-menthol (SARNA) lotion Apply 1 application topically daily. To arms in am and legs at bedtime . Can also use as needed.  . cetaphil (CETAPHIL) cream Apply 1 application topically daily.  . clobetasol cream (TEMOVATE) 1.61 % Apply 1 application topically 2 (two) times daily as needed.  . donepezil (ARICEPT) 5 MG tablet Take 5 mg by mouth at bedtime.  . famotidine (PEPCID) 10 MG tablet Take 10 mg by mouth at bedtime.  .  furosemide (LASIX) 20 MG tablet Take 20 mg by mouth daily.  Marland Kitchen losartan (COZAAR) 100 MG tablet Take 100 mg by mouth daily.  . Mirabegron ER (MYRBETRIQ) 25 MG TB24 Take 25 mg by mouth daily with breakfast. Prescribed by urology  . nitroGLYCERIN (NITROSTAT) 0.4 MG SL tablet Place 0.4 mg under the tongue every 5 (five) minutes as needed for chest pain. After taking three times and no relief call MD.  . polyethylene glycol (MIRALAX / GLYCOLAX) packet Take 17 g by mouth daily as needed for moderate constipation.   . potassium chloride (MICRO-K) 10 MEQ CR capsule Take 10 mEq by mouth daily.  Marland Kitchen pyridOXINE (VITAMIN B-6) 100 MG tablet Take 100 mg by mouth daily with breakfast.   .  alum & mag hydroxide-simeth (MAALOX/MYLANTA) 200-200-20 MG/5ML suspension Take 30 mLs by mouth every 4 (four) hours as needed for indigestion or heartburn.    No facility-administered encounter medications on file as of 05/13/2018.     Review of Systems  Unable to perform ROS: Dementia       Immunization History  Administered Date(s) Administered  . HiB (PRP-OMP) 02/12/2004  . Influenza Split 01/11/2012  . Influenza Whole 01/07/2009, 01/15/2018  . Influenza-Unspecified 01/11/2014, 01/01/2015, 01/29/2016, 02/01/2017  . PPD Test 11/14/2014, 06/03/2015, 06/17/2015  . Pneumococcal Conjugate-13 02/08/2017  . Pneumococcal Polysaccharide-23 04/28/2010  . Tdap 08/24/2011  . Zoster 04/13/2010   Pertinent  Health Maintenance Due  Topic Date Due  . INFLUENZA VACCINE  Completed  . DEXA SCAN  Completed  . PNA vac Low Risk Adult  Completed   Fall Risk  01/04/2018 12/29/2016 11/02/2016 06/20/2015 05/23/2015  Falls in the past year? No No Yes No No  Comment - - Emmi Telephone Survey: data to providers prior to load - -  Number falls in past yr: - - 2 or more - -  Comment - - Emmi Telephone Survey Actual Response = 90 - -  Injury with Fall? - - Yes - -   Functional Status Survey:    Vitals:   05/13/18 1104  BP: (!) 170/72    Pulse: 80  Resp: 18  Temp: 98.3 F (36.8 C)  SpO2: 97%  Weight: 161 lb 9.6 oz (73.3 kg)  Height: 5\' 5"  (1.651 m)   Body mass index is 26.89 kg/m. Physical Exam Vitals signs and nursing note reviewed.  Constitutional:      Appearance: Normal appearance.  HENT:     Head: Normocephalic.     Nose: Nose normal.     Mouth/Throat:     Mouth: Mucous membranes are moist.     Pharynx: Oropharynx is clear.  Eyes:     Pupils: Pupils are equal, round, and reactive to light.  Neck:     Musculoskeletal: Neck supple.  Cardiovascular:     Rate and Rhythm: Normal rate and regular rhythm.     Pulses: Normal pulses.     Heart sounds: Normal heart sounds.  Pulmonary:     Effort: Pulmonary effort is normal. No respiratory distress.     Breath sounds: Normal breath sounds. No wheezing or rales.  Abdominal:     General: Abdomen is flat. Bowel sounds are normal. There is no distension.     Palpations: Abdomen is soft.     Tenderness: There is no abdominal tenderness. There is no guarding.  Musculoskeletal:     Comments: Moderate Swelling Bilateral  Skin:    General: Skin is warm and dry.  Neurological:     General: No focal deficit present.     Mental Status: She is alert.     Comments: Was not Oriented. Will answer ok for everything. Follows Commands and is mostly Wheelchair Bound  Psychiatric:        Mood and Affect: Mood normal.        Thought Content: Thought content normal.        Judgment: Judgment normal.     Labs reviewed: Recent Labs    07/13/17 09/21/17 11/23/17  NA 141 143 141  K 3.6 3.5 3.9  CL  --  111 108  CO2  --  28 27  BUN 17 15 16   CREATININE 0.8 0.8 0.8  CALCIUM  --  9.3 9.4  MG  --   --  1.9   Recent Labs    06/24/17 11/23/17  AST 25 17  ALT  --  15  ALKPHOS 47 50  PROT  --  5.4  ALBUMIN  --  3.2   Recent Labs    07/13/17 11/23/17  WBC 7.4 7.3  HGB 12.4 12.7  HCT 36 37  PLT 385 347   Lab Results  Component Value Date   TSH 1.90 06/24/2017    Lab Results  Component Value Date   HGBA1C 4.4 11/14/2015   Lab Results  Component Value Date   CHOL 176 05/15/2014   HDL 62 05/15/2014   LDLCALC 95 05/15/2014   LDLDIRECT 114.3 12/08/2007   TRIG 93 05/15/2014   CHOLHDL 2.9 CALC 12/08/2007    Significant Diagnostic Results in last 30 days:  No results found.  Assessment/Plan Essential hypertension With her BP staying high and increased LE edema will increase her Lasix to 40 mg Increase Potassium to 20 meq She is also on Amlodipine and Losartan  Leg edema Increase her Lasix Weights 3/ week Repeat BMP in 2 weeks  Moderate dementia without behavioral disturbance  Continue Aricept Will consider adding Namenda   Age-related osteoporosis  On Fosamax  Chronic midline low back pain  Controlled on Tyelnol   urinary incontinence Continue Mybetriq     Family/ staff Communication:  Labs/tests ordered: BMP and CBC in 2 weeks

## 2018-05-17 ENCOUNTER — Encounter: Payer: Self-pay | Admitting: Internal Medicine

## 2018-05-17 ENCOUNTER — Non-Acute Institutional Stay (SKILLED_NURSING_FACILITY): Payer: Medicare Other | Admitting: Internal Medicine

## 2018-05-17 DIAGNOSIS — F0390 Unspecified dementia without behavioral disturbance: Secondary | ICD-10-CM | POA: Diagnosis not present

## 2018-05-17 DIAGNOSIS — R600 Localized edema: Secondary | ICD-10-CM | POA: Diagnosis not present

## 2018-05-17 DIAGNOSIS — I10 Essential (primary) hypertension: Secondary | ICD-10-CM | POA: Diagnosis not present

## 2018-05-17 DIAGNOSIS — F03B0 Unspecified dementia, moderate, without behavioral disturbance, psychotic disturbance, mood disturbance, and anxiety: Secondary | ICD-10-CM

## 2018-05-17 NOTE — Progress Notes (Signed)
Location:  [facility] Room Number: 3 Place of Service:  SNF (31) Provider:   [name], [name], NP  Patient Care Team: [name], [name], NP as PCP - General (Internal Medicine) [name], [name], NP as Nurse Practitioner (Nurse Practitioner)  Extended Emergency Contact Information Primary Emergency Contact: [place] Address: [place], [place] [address] [name] of [place] Phone: [phone] Work Phone: [phone] Mobile Phone: [phone] Relation: Daughter Secondary Emergency Contact: [name] Address: [address] [place] [address] [name] of [place] Phone: [phone] Mobile Phone: [phone] Relation: Son  Code Status:  DNR Goals of care: Advanced Directive information Advanced Directives 05/17/2018  Does Patient Have a Medical Advance Directive? Yes  Type of Paramedic of [place];Out of facility DNR (pink MOST or yellow form);Living will  Does patient want to make changes to medical advance directive? No - Patient declined  Copy of [place] in Chart? Yes - validated most recent copy scanned in chart (See row information)  Pre-existing out of facility DNR order (yellow form or pink MOST form) Yellow form placed in chart (order not valid for inpatient use)     No chief complaint on file.   HPI:  Pt is a 85 y.o. female seen today for Follow up of her BP and LE edema  Patient has h/o Hypertension, Bilateral LE edema, Lower Back Pain, Osteoporosis urinary Incontinence and Dementia  Patient unable to give me any detail history. I had seen her a week ago for Increased weight and Hypertension. And started her on Lasix. This was follow up visit . Her repeat Labs are pending.But patient is stable. Continues to deny any cough or Chest pain or SOB. She still has LE edema.  Past Medical History:  Diagnosis Date  . Abnormality of gait  04/28/2010   History of fall onto right shoulder-has participated in PT. Uses walker.     . Acute encephalopathy 10/25/14   Occurred when septic  . Acute lower GI bleeding 10/30/2014  . Adjustment disorder with mixed anxiety and depressed mood 06/04/2015   11/15/15 Hgb 12.8, Na 139, K 4.2, Bun 14, creat 0.68, TP 5.7, albumin 3.5, TSH 1.54, Hgb a1c 4.4   . Anemia   . Aortic atherosclerosis ([place]) 10/27/2014  . Asthma   . Cerebral atrophy ([facility]) 10/27/2014  . Cerebrovascular disease 10/27/2014   Small vessel disease  . Cervical disc disease   . Chronic lower back pain 06/04/2015   lumbar spondylosis. Regular f/u Dr. French Ana. Receives injections last 01/2013.  05/27/15 Xray left hip/pelvis: spondylotic changes, severe osteoarthritis Continue Tylenol and Tramadol prn for pain, observe the patient, adding Cymbalta may help. Mid to lower back pain more on the right side back, may consider X-ray of thoracic spine. 06/01/15 CT thoracic spine showed no acute changes.  Patient received 10 injection in the back through Dr. Alvester Morin office. She says the previous problems of discomfort in the back and down the leg have completely resolved. Takes Aleve 220mg  bid   . Debility 06/20/2015   11/15/15 Hgb 12.8, Na 139, K 4.2, Bun 14, creat 0.68, TP 5.7, albumin 3.5, TSH 1.54, Hgb a1c 4.4   . Degenerative disc disease, lumbar 10/27/2014  . Depression   . Dermatitis 03/25/2016  . Diarrhea 10/25/14  . Diverticulosis 10/27/2014  . Essential hypertension 09/20/2006   11/15/15  Hgb 12.8, Na 139, K 4.2, Bun 14, creat 0.68, TP 5.7, albumin 3.5, TSH 1.54, Hgb a1c 4.4   . Fall at nursing home 10/25/2014  . GERD (gastroesophageal reflux disease)   . History of cardiovascular disorder 09/20/2006   X 3 in 2000 now on aspirin.    Marland Kitchen Hyperlipidemia 09/20/2006   05/15/14 LDL 95   . Hypertension   . Lumbosacral spondylosis 10/28/2009  . OSTEOPOROSIS 09/20/2006   Noted 2008    . Overactive bladder 11/29/2013   Myrbetriq prescribed by urology.      . Protein calorie malnutrition ([facility])    06/06/15 TP 6.0 albumin 2.9  . SCOLIOSIS 10/28/2009  . Scoliosis 10/27/2014  . Thrombocytosis ([facility]) 05/31/2015   06/10/15 plt 602 07/23/15 plt 335 11/15/15 Hgb 12.8, Na 139, K 4.2, Bun 14, creat 0.68, TP 5.7, albumin 3.5, TSH 1.54, Hgb a1c 4.4  . TIA (transient ischemic attack) 09/20/2006   Past Surgical History:  Procedure Laterality Date  . ABDOMINAL HYSTERECTOMY    . APPENDECTOMY    . CHOLECYSTECTOMY    . TONSILLECTOMY      Allergies  Allergen Reactions  . Tramadol Other (See Comments)    Hallucinations, agitation/combativeness  . Amoxicillin     Per MAR  . Oxycodone Other (See Comments)    Per MAR   . Sulfamethoxazole     Per MAR   . Penicillins Rash    Outpatient Encounter Medications as of 05/17/2018  Medication Sig  . acetaminophen (TYLENOL) 500 MG tablet Take 500 mg by mouth 3 (three) times daily.  Marland Kitchen albuterol (PROVENTIL HFA;VENTOLIN HFA) 108 (90 BASE) MCG/ACT inhaler Inhale 2 puffs into the lungs every 6 (six) hours as needed for wheezing or shortness of breath.  Marland Kitchen alendronate (FOSAMAX) 70 MG tablet Take 70 mg by mouth once a week. Take with a full glass of water on an empty stomach.  Marland Kitchen alum & mag hydroxide-simeth (MAALOX/MYLANTA) 200-200-20 MG/5ML suspension Take 30 mLs by mouth every 4 (four) hours as needed for indigestion or heartburn.   Marland Kitchen amLODipine (NORVASC) 2.5 MG tablet Take 2.5 mg by mouth daily. Hold if SBP <110  . calcium carbonate (OSCAL) 1500 (600 Ca) MG TABS tablet Take 600 mg of elemental calcium by mouth 2 (two) times daily with a meal.  . camphor-menthol (SARNA) lotion Apply 1 application topically daily. To arms in am and legs at bedtime . Can also use as needed.  . cetaphil (CETAPHIL) cream Apply 1 application topically daily.  . clobetasol cream (TEMOVATE) 0.25 % Apply 1 application topically 2 (two) times daily as needed.  . donepezil (ARICEPT) 5 MG tablet Take 5 mg by mouth at bedtime.  . famotidine (PEPCID) 10 MG  tablet Take 10 mg by mouth at bedtime.  . furosemide (LASIX) 20 MG tablet Take 20 mg by mouth daily.  Marland Kitchen losartan (COZAAR) 100 MG tablet Take 100 mg by mouth daily.  . Mirabegron ER (MYRBETRIQ) 25 MG TB24 Take 25 mg by mouth daily with breakfast. Prescribed by urology  . nitroGLYCERIN (NITROSTAT) 0.4 MG SL tablet Place 0.4 mg under the tongue every 5 (five) minutes as needed for chest pain. After taking three times and no relief call MD.  . polyethylene glycol (MIRALAX / GLYCOLAX) packet Take 17 g by mouth daily as needed for moderate constipation.   Marland Kitchen pyridOXINE (VITAMIN B-6) 100 MG tablet Take 100 mg by mouth daily with breakfast.   . potassium chloride (MICRO-K) 10 MEQ CR capsule Take 10 mEq by  mouth daily.   No facility-administered encounter medications on file as of 05/17/2018.     Review of Systems  Unable to perform ROS: Dementia       Immunization History  Administered Date(s) Administered  . HiB (PRP-OMP) 02/12/2004  . Influenza Split 01/11/2012  . Influenza Whole 01/07/2009, 01/15/2018  . Influenza-Unspecified 01/11/2014, 01/01/2015, 01/29/2016, 02/01/2017  . PPD Test 11/14/2014, 06/03/2015, 06/17/2015  . Pneumococcal Conjugate-13 02/08/2017  . Pneumococcal Polysaccharide-23 04/28/2010  . Tdap 08/24/2011  . Zoster 04/13/2010   Pertinent  Health Maintenance Due  Topic Date Due  . INFLUENZA VACCINE  Completed  . DEXA SCAN  Completed  . PNA vac Low Risk Adult  Completed   Fall Risk  01/04/2018 12/29/2016 11/02/2016 06/20/2015 05/23/2015  Falls in the past year? No No Yes No No  Comment - - Emmi Telephone Survey: data to providers prior to load - -  Number falls in past yr: - - 2 or more - -  Comment - - Emmi Telephone Survey Actual Response = 90 - -  Injury with Fall? - - Yes - -   Functional Status Survey:    Vitals:   05/17/18 0917  BP: (!) 148/62  Pulse: 80  Resp: (!) 24  Temp: 98.1 F (36.7 C)  TempSrc: Oral  Weight: 161 lb (73 kg)  Height: 5\' 5"  (1.651 m)    Body mass index is 26.79 kg/m. Physical Exam Vitals signs and nursing note reviewed.  Constitutional:      Appearance: Normal appearance.  HENT:     Head: Normocephalic.     Nose: Nose normal.     Mouth/Throat:     Mouth: Mucous membranes are moist.     Pharynx: Oropharynx is clear.  Eyes:     Pupils: Pupils are equal, round, and reactive to light.  Neck:     Musculoskeletal: Neck supple.  Cardiovascular:     Rate and Rhythm: Normal rate and regular rhythm.     Pulses: Normal pulses.     Heart sounds: Normal heart sounds.  Pulmonary:     Effort: Pulmonary effort is normal. No respiratory distress.     Breath sounds: Normal breath sounds. No wheezing or rales.  Abdominal:     General: Abdomen is flat. Bowel sounds are normal. There is no distension.     Palpations: Abdomen is soft.     Tenderness: There is no abdominal tenderness. There is no guarding.  Musculoskeletal:     Comments: Moderate Swelling Bilateral  Skin:    General: Skin is warm and dry.  Neurological:     General: No focal deficit present.     Mental Status: She is alert.     Comments: Was not Oriented. Will answer ok for everything. Follows Commands and is mostly Wheelchair Bound  Psychiatric:        Mood and Affect: Mood normal.        Thought Content: Thought content normal.        Judgment: Judgment normal.     Labs reviewed: Recent Labs    07/13/17 09/21/17 11/23/17  NA 141 143 141  K 3.6 3.5 3.9  CL  --  111 108  CO2  --  28 27  BUN 17 15 16   CREATININE 0.8 0.8 0.8  CALCIUM  --  9.3 9.4  MG  --   --  1.9   Recent Labs    06/24/17 11/23/17  AST 25 17  ALT  --  15  ALKPHOS 47 50  PROT  --  5.4  ALBUMIN  --  3.2   Recent Labs    07/13/17 11/23/17  WBC 7.4 7.3  HGB 12.4 12.7  HCT 36 37  PLT 385 347   Lab Results  Component Value Date   TSH 1.90 06/24/2017   Lab Results  Component Value Date   HGBA1C 4.4 11/14/2015   Lab Results  Component Value Date   CHOL 176  05/15/2014   HDL 62 05/15/2014   LDLCALC 95 05/15/2014   LDLDIRECT 114.3 12/08/2007   TRIG 93 05/15/2014   CHOLHDL 2.9 CALC 12/08/2007    Significant Diagnostic Results in last 30 days:  No results found.  Assessment/Plan Essential hypertension BP more stable Continue Lasix 40 mg She is also on Amlodipine and Losartan  Leg edema Increased her Lasix Weights 3/ week Repeat BMP in 2 weeks Pending Will also Try Compression Stockings  Moderate dementia without behavioral disturbance  Continue Aricept Will consider adding Namenda   Age-related osteoporosis  On Fosamax  Chronic midline low back pain  Controlled on Tyelnol   urinary incontinence Continue Mybetriq     Family/ staff Communication:  Labs/tests ordered: BMP and CBC in 2 weeks  .Total time spent in this patient care encounter was _25 minutes; greater than 50% of the visit spent counseling patient, reviewing records , Labs and coordinating care for problems addressed at this encounter.

## 2018-05-19 DIAGNOSIS — I10 Essential (primary) hypertension: Secondary | ICD-10-CM | POA: Diagnosis not present

## 2018-05-19 LAB — CBC AND DIFFERENTIAL
HEMATOCRIT: 38 (ref 36–46)
HEMOGLOBIN: 13 (ref 12.0–16.0)
Platelets: 351 (ref 150–399)

## 2018-05-19 LAB — BASIC METABOLIC PANEL
BUN: 13 (ref 4–21)
Creatinine: 0.7 (ref 0.5–1.1)
GLUCOSE: 95
Potassium: 3.7 (ref 3.4–5.3)
Sodium: 141 (ref 137–147)

## 2018-05-19 LAB — HEPATIC FUNCTION PANEL
ALT: 16 (ref 7–35)
AST: 18 (ref 13–35)

## 2018-05-20 ENCOUNTER — Encounter: Payer: Self-pay | Admitting: Nurse Practitioner

## 2018-06-08 ENCOUNTER — Encounter: Payer: Self-pay | Admitting: Nurse Practitioner

## 2018-06-08 ENCOUNTER — Non-Acute Institutional Stay (SKILLED_NURSING_FACILITY): Payer: Medicare Other | Admitting: Nurse Practitioner

## 2018-06-08 DIAGNOSIS — M159 Polyosteoarthritis, unspecified: Secondary | ICD-10-CM

## 2018-06-08 DIAGNOSIS — I739 Peripheral vascular disease, unspecified: Secondary | ICD-10-CM

## 2018-06-08 DIAGNOSIS — F0390 Unspecified dementia without behavioral disturbance: Secondary | ICD-10-CM

## 2018-06-08 DIAGNOSIS — M150 Primary generalized (osteo)arthritis: Secondary | ICD-10-CM | POA: Diagnosis not present

## 2018-06-08 DIAGNOSIS — K219 Gastro-esophageal reflux disease without esophagitis: Secondary | ICD-10-CM | POA: Diagnosis not present

## 2018-06-08 DIAGNOSIS — F03B0 Unspecified dementia, moderate, without behavioral disturbance, psychotic disturbance, mood disturbance, and anxiety: Secondary | ICD-10-CM

## 2018-06-08 DIAGNOSIS — N3281 Overactive bladder: Secondary | ICD-10-CM | POA: Diagnosis not present

## 2018-06-08 DIAGNOSIS — I10 Essential (primary) hypertension: Secondary | ICD-10-CM | POA: Diagnosis not present

## 2018-06-08 NOTE — Assessment & Plan Note (Signed)
Continue [facility] for safety and care assistance, continue Donepezil 5mg qd for memory.  

## 2018-06-08 NOTE — Assessment & Plan Note (Signed)
Chronic edema remains no change, continue Furosemide 40mg  qd.

## 2018-06-08 NOTE — Assessment & Plan Note (Signed)
Blood pressure is in control, continue Losartan 100mg  qd, Amlodipine 2.5mg  qd.

## 2018-06-08 NOTE — Assessment & Plan Note (Signed)
Stable, continue Mirabegron 25mg  qd.

## 2018-06-08 NOTE — Assessment & Plan Note (Signed)
Lower back pain is chronic, continue Tylenol 500mg  tid, adding 500mg  q8h prn, encourage the patient bedrest between meals.

## 2018-06-08 NOTE — Assessment & Plan Note (Signed)
Stable, continue Famotidine 10mg qd.  

## 2018-06-08 NOTE — Progress Notes (Signed)
Location:  [facility] Room Number: 3 Place of Service:  SNF (31) Provider:  Marlana Latus  NP  Sou Nohr X, NP  Patient Care Team: Joannah Gitlin X, NP as PCP - General (Internal Medicine) Makail Watling X, NP as Nurse Practitioner (Nurse Practitioner)  Extended Emergency Contact Information Primary Emergency Contact: [place] Address: [place], [place] [address] [name] of [place] Phone: [phone] Work Phone: [phone] Mobile Phone: [phone] Relation: Daughter Secondary Emergency Contact: [name] Address: [address] [place] [address] [name] of [place] Phone: [phone] Mobile Phone: [phone] Relation: Son  Code Status:  DNR Goals of care: Advanced Directive information Advanced Directives 05/17/2018  Does Patient Have a Medical Advance Directive? Yes  Type of Paramedic of [place];Out of facility DNR (pink MOST or yellow form);Living will  Does patient want to make changes to medical advance directive? No - Patient declined  Copy of [place] in Chart? Yes - validated most recent copy scanned in chart (See row information)  Pre-existing out of facility DNR order (yellow form or pink MOST form) Yellow form placed in chart (order not valid for inpatient use)     Chief Complaint  Patient presents with  . Medical Management of Chronic Issues    HPI:  Pt is a 85 y.o. female seen today for medical management of chronic diseases.    The patient has chronic lower back pain, mostly when she sits in w/c for a long period of time, she usually stays in w/c during day, on Tylenol 500mg  tid, underwent Ortho eval/treatment in the past. BLE edema, chronic, on Furosemide 40mg  qd. Urinary frequency, on Mirabegron 25mg  qd, no urinary retention. HTN, blood pressure is controlled on Losartan 100mg  qd, Amlodipine 2.5mg  qd. GERD stable on  Famotidine 10mg  qd. She resides [facility] for safety and care assistance, on Donepezil 5mg  qd for memory.    Past Medical History:  Diagnosis Date  . Abnormality of gait 04/28/2010   History of fall onto right shoulder-has participated in PT. Uses walker.     . Acute encephalopathy 10/25/14   Occurred when septic  . Acute lower GI bleeding 10/30/2014  . Adjustment disorder with mixed anxiety and depressed mood 06/04/2015   11/15/15 Hgb 12.8, Na 139, K 4.2, Bun 14, creat 0.68, TP 5.7, albumin 3.5, TSH 1.54, Hgb a1c 4.4   . Anemia   . Aortic atherosclerosis ([place]) 10/27/2014  . Asthma   . Cerebral atrophy ([place]) 10/27/2014  . Cerebrovascular disease 10/27/2014   Small vessel disease  . Cervical disc disease   . Chronic lower back pain 06/04/2015   lumbar spondylosis. Regular f/u Dr. French Ana. Receives injections last 01/2013.  05/27/15 Xray left hip/pelvis: spondylotic changes, severe osteoarthritis Continue Tylenol and Tramadol prn for pain, observe the patient, adding Cymbalta may help. Mid to lower back pain more on the right side back, may consider X-ray of thoracic spine. 06/01/15 CT thoracic spine showed no acute changes.  Patient received 10 injection in the back through Dr. Alvester Morin office. She says the previous problems of discomfort in the back and down the leg have completely resolved. Takes Aleve 220mg  bid   . Debility 06/20/2015   11/15/15 Hgb 12.8, Na 139, K 4.2, Bun 14, creat 0.68, TP 5.7, albumin 3.5, TSH 1.54, Hgb a1c 4.4   .  Degenerative disc disease, lumbar 10/27/2014  . Depression   . Dermatitis 03/25/2016  . Diarrhea 10/25/14  . Diverticulosis 10/27/2014  . Essential hypertension 09/20/2006   11/15/15 Hgb 12.8, Na 139, K 4.2, Bun 14, creat 0.68, TP 5.7, albumin 3.5, TSH 1.54, Hgb a1c 4.4   . Fall at nursing home 10/25/2014  . GERD (gastroesophageal reflux disease)   . History of cardiovascular disorder 09/20/2006   X 3 in 2000 now on aspirin.    Marland Kitchen Hyperlipidemia 09/20/2006   05/15/14 LDL  95   . Hypertension   . Lumbosacral spondylosis 10/28/2009  . OSTEOPOROSIS 09/20/2006   Noted 2008    . Overactive bladder 11/29/2013   Myrbetriq prescribed by urology.    . Protein calorie malnutrition ([place])    06/06/15 TP 6.0 albumin 2.9  . SCOLIOSIS 10/28/2009  . Scoliosis 10/27/2014  . Thrombocytosis ([place]) 05/31/2015   06/10/15 plt 602 07/23/15 plt 335 11/15/15 Hgb 12.8, Na 139, K 4.2, Bun 14, creat 0.68, TP 5.7, albumin 3.5, TSH 1.54, Hgb a1c 4.4  . TIA (transient ischemic attack) 09/20/2006   Past Surgical History:  Procedure Laterality Date  . ABDOMINAL HYSTERECTOMY    . APPENDECTOMY    . CHOLECYSTECTOMY    . TONSILLECTOMY      Allergies  Allergen Reactions  . Tramadol Other (See Comments)    Hallucinations, agitation/combativeness  . Amoxicillin     Per MAR  . Oxycodone Other (See Comments)    Per MAR   . Sulfamethoxazole     Per MAR   . Penicillins Rash    Outpatient Encounter Medications as of 06/08/2018  Medication Sig  . acetaminophen (TYLENOL) 500 MG tablet Take 500 mg by mouth 3 (three) times daily.  Marland Kitchen albuterol (PROVENTIL HFA;VENTOLIN HFA) 108 (90 BASE) MCG/ACT inhaler Inhale 2 puffs into the lungs every 6 (six) hours as needed for wheezing or shortness of breath.  Marland Kitchen alendronate (FOSAMAX) 70 MG tablet Take 70 mg by mouth once a week. Take with a full glass of water on an empty stomach.  Marland Kitchen alum & mag hydroxide-simeth (MAALOX/MYLANTA) 200-200-20 MG/5ML suspension Take 30 mLs by mouth every 4 (four) hours as needed for indigestion or heartburn.   Marland Kitchen amLODipine (NORVASC) 2.5 MG tablet Take 2.5 mg by mouth daily. Hold if SBP <110  . calcium carbonate (OSCAL) 1500 (600 Ca) MG TABS tablet Take 600 mg of elemental calcium by mouth 2 (two) times daily with a meal.  . camphor-menthol (SARNA) lotion Apply 1 application topically as needed. To arms in am and legs at bedtime . Can also use as needed.  . cetaphil (CETAPHIL) cream Apply 1 application topically daily.  . clobetasol  cream (TEMOVATE) 1.61 % Apply 1 application topically 2 (two) times daily as needed.  . donepezil (ARICEPT) 5 MG tablet Take 5 mg by mouth at bedtime.  . famotidine (PEPCID) 10 MG tablet Take 10 mg by mouth at bedtime.  . furosemide (LASIX) 20 MG tablet Take 40 mg by mouth daily.   Marland Kitchen losartan (COZAAR) 100 MG tablet Take 100 mg by mouth daily.  . Menthol, Topical Analgesic, (BIOFREEZE) 4 % GEL Apply topically 4 (four) times daily.  . Mirabegron ER (MYRBETRIQ) 25 MG TB24 Take 25 mg by mouth daily with breakfast. Prescribed by urology  . nitroGLYCERIN (NITROSTAT) 0.4 MG SL tablet Place 0.4 mg under the tongue every 5 (five) minutes as needed for chest pain. After taking three times and no relief call MD.  . polyethylene glycol (MIRALAX /  GLYCOLAX) packet Take 17 g by mouth daily as needed for moderate constipation.   . potassium chloride (MICRO-K) 10 MEQ CR capsule Take 20 mEq by mouth daily.   Marland Kitchen pyridOXINE (VITAMIN B-6) 100 MG tablet Take 100 mg by mouth daily with breakfast.    No facility-administered encounter medications on file as of 06/08/2018.    ROS was provided with assistance of staff Review of Systems  Constitutional: Negative for activity change, appetite change, chills, diaphoresis, fatigue, fever and unexpected weight change.  HENT: Positive for hearing loss. Negative for congestion and voice change.   Respiratory: Negative for cough, shortness of breath and wheezing.   Cardiovascular: Positive for leg swelling. Negative for chest pain and palpitations.  Gastrointestinal: Negative for abdominal distention, abdominal pain, constipation, diarrhea, nausea and vomiting.  Genitourinary: Negative for difficulty urinating, dysuria and urgency.  Musculoskeletal: Positive for arthralgias, back pain and gait problem.  Skin: Negative for color change and pallor.  Neurological: Negative for dizziness, speech difficulty, weakness and headaches.       Dementia  Psychiatric/Behavioral: Negative  for agitation, hallucinations and sleep disturbance. The patient is not nervous/anxious.     Immunization History  Administered Date(s) Administered  . HiB (PRP-OMP) 02/12/2004  . Influenza Split 01/11/2012  . Influenza Whole 01/07/2009, 01/15/2018  . Influenza-Unspecified 01/11/2014, 01/01/2015, 01/29/2016, 02/01/2017  . PPD Test 11/14/2014, 06/03/2015, 06/17/2015  . Pneumococcal Conjugate-13 02/08/2017  . Pneumococcal Polysaccharide-23 04/28/2010  . Tdap 08/24/2011  . Zoster 04/13/2010   Pertinent  Health Maintenance Due  Topic Date Due  . INFLUENZA VACCINE  Completed  . DEXA SCAN  Completed  . PNA vac Low Risk Adult  Completed   Fall Risk  01/04/2018 12/29/2016 11/02/2016 06/20/2015 05/23/2015  Falls in the past year? No No Yes No No  Comment - - Emmi Telephone Survey: data to providers prior to load - -  Number falls in past yr: - - 2 or more - -  Comment - - Emmi Telephone Survey Actual Response = 90 - -  Injury with Fall? - - Yes - -   Functional Status Survey:    Vitals:   06/08/18 0858  BP: (!) 146/70  Pulse: 80  Resp: (!) 24  Temp: 98.1 F (36.7 C)  SpO2: 97%  Weight: 162 lb (73.5 kg)  Height: 5\' 5"  (1.651 m)   Body mass index is 26.96 kg/m. Physical Exam Constitutional:      General: She is not in acute distress.    Appearance: Normal appearance. She is not ill-appearing, toxic-appearing or diaphoretic.     Comments: Slightly overweight  HENT:     Head: Normocephalic and atraumatic.     Nose: Nose normal.     Mouth/Throat:     Mouth: Mucous membranes are moist.  Eyes:     Extraocular Movements: Extraocular movements intact.     Pupils: Pupils are equal, round, and reactive to light.  Neck:     Musculoskeletal: Normal range of motion and neck supple.  Cardiovascular:     Rate and Rhythm: Normal rate and regular rhythm.     Heart sounds: No murmur.  Pulmonary:     Effort: Pulmonary effort is normal.     Breath sounds: No wheezing, rhonchi or rales.    Abdominal:     General: There is no distension.     Palpations: Abdomen is soft.     Tenderness: There is no abdominal tenderness. There is no guarding or rebound.  Musculoskeletal:  Right lower leg: Edema present.     Left lower leg: Edema present.     Comments: Trace edema BLE. W/c for mobility. Lower back pain palpated.   Skin:    General: Skin is warm and dry.  Neurological:     General: No focal deficit present.     Mental Status: She is alert. Mental status is at baseline.     Cranial Nerves: No cranial nerve deficit.     Motor: No weakness.     Coordination: Coordination normal.     Gait: Gait abnormal.     Comments: Oriented to person and her room on unit.   Psychiatric:        Mood and Affect: Mood normal.        Behavior: Behavior normal.     Labs reviewed: Recent Labs    09/21/17 11/23/17 05/19/18  NA 143 141 141  K 3.5 3.9 3.7  CL 111 108  --   CO2 28 27  --   BUN 15 16 13   CREATININE 0.8 0.8 0.7  CALCIUM 9.3 9.4  --   MG  --  1.9  --    Recent Labs    06/24/17 11/23/17 05/19/18  AST 25 17 18   ALT  --  15 16  ALKPHOS 47 50  --   PROT  --  5.4  --   ALBUMIN  --  3.2  --    Recent Labs    07/13/17 11/23/17 05/19/18  WBC 7.4 7.3  --   HGB 12.4 12.7 13.0  HCT 36 37 38  PLT 385 347 351   Lab Results  Component Value Date   TSH 1.90 06/24/2017   Lab Results  Component Value Date   HGBA1C 4.4 11/14/2015   Lab Results  Component Value Date   CHOL 176 05/15/2014   HDL 62 05/15/2014   LDLCALC 95 05/15/2014   LDLDIRECT 114.3 12/08/2007   TRIG 93 05/15/2014   CHOLHDL 2.9 CALC 12/08/2007    Significant Diagnostic Results in last 30 days:  No results found.  Assessment/Plan Essential hypertension Blood pressure is in control, continue Losartan 100mg  qd, Amlodipine 2.5mg  qd.   PVD (peripheral vascular disease) (HCC) Chronic edema remains no change, continue Furosemide 40mg  qd.   GERD (gastroesophageal reflux disease) Stable, continue  Famotidine 10mg  qd.   Moderate dementia without behavioral disturbance ([place]) Continue SNF [facility] for safety and care assistance, continue Donepezil 5mg  qd for memory.   Osteoarthritis (arthritis due to wear and tear of joints) Lower back pain is chronic, continue Tylenol 500mg  tid, adding 500mg  q8h prn, encourage the patient bedrest between meals.   Overactive bladder Stable, continue Mirabegron 25mg  qd.      Family/ staff Communication: plan of care reviewed with the patient and charge nurse.   Labs/tests ordered:  none  Time spend 25 minutes.

## 2018-06-09 ENCOUNTER — Encounter: Payer: Self-pay | Admitting: Nurse Practitioner

## 2018-07-06 ENCOUNTER — Non-Acute Institutional Stay (SKILLED_NURSING_FACILITY): Payer: Medicare Other | Admitting: Nurse Practitioner

## 2018-07-06 ENCOUNTER — Encounter: Payer: Self-pay | Admitting: Nurse Practitioner

## 2018-07-06 DIAGNOSIS — F0390 Unspecified dementia without behavioral disturbance: Secondary | ICD-10-CM

## 2018-07-06 DIAGNOSIS — N3281 Overactive bladder: Secondary | ICD-10-CM

## 2018-07-06 DIAGNOSIS — M545 Low back pain: Secondary | ICD-10-CM | POA: Diagnosis not present

## 2018-07-06 DIAGNOSIS — I10 Essential (primary) hypertension: Secondary | ICD-10-CM | POA: Diagnosis not present

## 2018-07-06 DIAGNOSIS — I739 Peripheral vascular disease, unspecified: Secondary | ICD-10-CM | POA: Diagnosis not present

## 2018-07-06 DIAGNOSIS — K219 Gastro-esophageal reflux disease without esophagitis: Secondary | ICD-10-CM

## 2018-07-06 DIAGNOSIS — G8929 Other chronic pain: Secondary | ICD-10-CM | POA: Diagnosis not present

## 2018-07-06 DIAGNOSIS — F03B0 Unspecified dementia, moderate, without behavioral disturbance, psychotic disturbance, mood disturbance, and anxiety: Secondary | ICD-10-CM

## 2018-07-06 NOTE — Assessment & Plan Note (Signed)
BLE edema, chronic, continue  Furosemide 40mg  qd.

## 2018-07-06 NOTE — Assessment & Plan Note (Signed)
Continue SNF [facility] for safety and care assistance, continue Donepezil 10mg  qd to preserve memory.

## 2018-07-06 NOTE — Assessment & Plan Note (Signed)
mild elevated Sbp in 150s, asymptomatic, will continue Losartan 100mg  qd, Amlodipine 2.5mg  qd.

## 2018-07-06 NOTE — Assessment & Plan Note (Signed)
Lower back pain is manage, continue Tylenol 500mg  tid, q8h prn, and w/c for mobility.

## 2018-07-06 NOTE — Progress Notes (Signed)
Location:  [facility] Room Number: 3 Place of Service:  SNF (31) Provider: Marlana Latus  NP  Gussie Towson X, NP  Patient Care Team: Royetta Probus X, NP as PCP - General (Internal Medicine) Camelia Stelzner X, NP as Nurse Practitioner (Nurse Practitioner)  Extended Emergency Contact Information Primary Emergency Contact: Clinton Address: [place], [place] [address] [name] of [place] Phone: [phone] Work Phone: [phone] Mobile Phone: [phone] Relation: Daughter Secondary Emergency Contact: [name] Address: [address] [place] [address] [name] of [place] Phone: [phone] Mobile Phone: [phone] Relation: Son  Code Status:  DNR Goals of care: Advanced Directive information Advanced Directives 07/06/2018  Does Patient Have a Medical Advance Directive? Yes  Type of Paramedic of [place];Living will;Out of facility DNR (pink MOST or yellow form)  Does patient want to make changes to medical advance directive? No - Patient declined  Copy of [place] in Chart? Yes - validated most recent copy scanned in chart (See row information)  Pre-existing out of facility DNR order (yellow form or pink MOST form) Yellow form placed in chart (order not valid for inpatient use)     Chief Complaint  Patient presents with  . Medical Management of Chronic Issues    HPI:  Pt is a 85 y.o. female seen today for medical management of chronic diseases.     The patient has history of dementia, resides in SNF [facility] for safety and care assistance, on Donepezil 10mg  qd to preserve memory. BLE edema, chronic, on Furosemide 40mg  qd. HTN, mild elevated Sbp in 150s, on Losartan 100mg  qd, Amlodipine 2.5mg  qd. OAB, no urinary retention, on Mirabegron 25mg  qd. GERD, stable on Famotidine 10mg  qd, prn Maalox/Mylanta 30mg  q4hr. Lower back pain is manage with  Tylenol 500mg  tid, q8h prn, and w/c for mobility.    Past Medical History:  Diagnosis Date  . Abnormality of gait 04/28/2010   History of fall onto right shoulder-has participated in PT. Uses walker.     . Acute encephalopathy 10/25/14   Occurred when septic  . Acute lower GI bleeding 10/30/2014  . Adjustment disorder with mixed anxiety and depressed mood 06/04/2015   11/15/15 Hgb 12.8, Na 139, K 4.2, Bun 14, creat 0.68, TP 5.7, albumin 3.5, TSH 1.54, Hgb a1c 4.4   . Anemia   . Aortic atherosclerosis ([place]) 10/27/2014  . Asthma   . Cerebral atrophy ([place]) 10/27/2014  . Cerebrovascular disease 10/27/2014   Small vessel disease  . Cervical disc disease   . Chronic lower back pain 06/04/2015   lumbar spondylosis. Regular f/u Dr. French Ana. Receives injections last 01/2013.  05/27/15 Xray left hip/pelvis: spondylotic changes, severe osteoarthritis Continue Tylenol and Tramadol prn for pain, observe the patient, adding Cymbalta may help. Mid to lower back pain more on the right side back, may consider X-ray of thoracic spine. 06/01/15 CT thoracic spine showed no acute changes.  Patient received 10 injection in the back through Dr. Alvester Morin office. She says the previous problems of discomfort in the back and down the leg have completely resolved. Takes Aleve 220mg  bid   . Debility 06/20/2015   11/15/15 Hgb 12.8, Na 139, K 4.2, Bun 14, creat 0.68, TP 5.7, albumin 3.5, TSH 1.54, Hgb a1c 4.4   . Degenerative disc disease, lumbar 10/27/2014  . Depression   .  Dermatitis 03/25/2016  . Diarrhea 10/25/14  . Diverticulosis 10/27/2014  . Essential hypertension 09/20/2006   11/15/15 Hgb 12.8, Na 139, K 4.2, Bun 14, creat 0.68, TP 5.7, albumin 3.5, TSH 1.54, Hgb a1c 4.4   . Fall at nursing home 10/25/2014  . GERD (gastroesophageal reflux disease)   . History of cardiovascular disorder 09/20/2006   X 3 in 2000 now on aspirin.    Marland Kitchen Hyperlipidemia 09/20/2006   05/15/14 LDL 95   . Hypertension   . Lumbosacral spondylosis  10/28/2009  . OSTEOPOROSIS 09/20/2006   Noted 2008    . Overactive bladder 11/29/2013   Myrbetriq prescribed by urology.    . Protein calorie malnutrition ([place])    06/06/15 TP 6.0 albumin 2.9  . SCOLIOSIS 10/28/2009  . Scoliosis 10/27/2014  . Thrombocytosis ([place]) 05/31/2015   06/10/15 plt 602 07/23/15 plt 335 11/15/15 Hgb 12.8, Na 139, K 4.2, Bun 14, creat 0.68, TP 5.7, albumin 3.5, TSH 1.54, Hgb a1c 4.4  . TIA (transient ischemic attack) 09/20/2006   Past Surgical History:  Procedure Laterality Date  . ABDOMINAL HYSTERECTOMY    . APPENDECTOMY    . CHOLECYSTECTOMY    . TONSILLECTOMY      Allergies  Allergen Reactions  . Tramadol Other (See Comments)    Hallucinations, agitation/combativeness  . Amoxicillin     Per MAR  . Oxycodone Other (See Comments)    Per MAR   . Sulfamethoxazole     Per MAR   . Penicillins Rash    Outpatient Encounter Medications as of 07/06/2018  Medication Sig  . acetaminophen (TYLENOL) 500 MG tablet Take 500 mg by mouth 3 (three) times daily.  Marland Kitchen acetaminophen (TYLENOL) 500 MG tablet Take 500 mg by mouth every 8 (eight) hours as needed.  Marland Kitchen albuterol (PROVENTIL HFA;VENTOLIN HFA) 108 (90 BASE) MCG/ACT inhaler Inhale 2 puffs into the lungs every 6 (six) hours as needed for wheezing or shortness of breath.  Marland Kitchen alendronate (FOSAMAX) 70 MG tablet Take 70 mg by mouth once a week. Take with a full glass of water on an empty stomach.  Marland Kitchen alum & mag hydroxide-simeth (MAALOX/MYLANTA) 200-200-20 MG/5ML suspension Take 30 mLs by mouth every 4 (four) hours as needed for indigestion or heartburn.   Marland Kitchen amLODipine (NORVASC) 2.5 MG tablet Take 2.5 mg by mouth daily. Hold if SBP <110  . calcium carbonate (OSCAL) 1500 (600 Ca) MG TABS tablet Take 600 mg of elemental calcium by mouth 2 (two) times daily with a meal.  . camphor-menthol (SARNA) lotion Apply 1 application topically as needed. To arms in am and legs at bedtime . Can also use as needed.  . cetaphil (CETAPHIL) cream Apply 1  application topically daily.  . clobetasol cream (TEMOVATE) 8.56 % Apply 1 application topically 2 (two) times daily as needed.  . donepezil (ARICEPT) 5 MG tablet Take 10 mg by mouth at bedtime. Take 2 tablets = 10 mg  . famotidine (PEPCID) 10 MG tablet Take 10 mg by mouth at bedtime.  . furosemide (LASIX) 20 MG tablet Take 40 mg by mouth daily.   Marland Kitchen losartan (COZAAR) 100 MG tablet Take 100 mg by mouth daily.  . Menthol, Topical Analgesic, (BIOFREEZE) 4 % GEL Apply topically 4 (four) times daily.  . Mirabegron ER (MYRBETRIQ) 25 MG TB24 Take 25 mg by mouth daily with breakfast. Prescribed by urology  . nitroGLYCERIN (NITROSTAT) 0.4 MG SL tablet Place 0.4 mg under the tongue every 5 (five) minutes as needed for chest pain. After  taking three times and no relief call MD.  . polyethylene glycol (MIRALAX / GLYCOLAX) packet Take 17 g by mouth daily as needed for moderate constipation.   . potassium chloride (MICRO-K) 10 MEQ CR capsule Take 20 mEq by mouth daily.   Marland Kitchen pyridOXINE (VITAMIN B-6) 100 MG tablet Take 100 mg by mouth daily with breakfast.    No facility-administered encounter medications on file as of 07/06/2018.    ROS was provided with assistance of staff Review of Systems  Constitutional: Negative for activity change, appetite change, chills, diaphoresis, fatigue, fever and unexpected weight change.  HENT: Positive for hearing loss. Negative for congestion and voice change.   Eyes: Negative for visual disturbance.  Respiratory: Negative for cough and shortness of breath.   Cardiovascular: Positive for leg swelling. Negative for chest pain and palpitations.  Gastrointestinal: Negative for abdominal distention, constipation, diarrhea, nausea and vomiting.  Genitourinary: Negative for difficulty urinating, dysuria and urgency.  Musculoskeletal: Positive for arthralgias and gait problem.  Skin: Negative for color change and pallor.  Neurological: Negative for dizziness, speech difficulty,  weakness and headaches.       Dementia  Psychiatric/Behavioral: Negative for agitation, behavioral problems, hallucinations and sleep disturbance. The patient is not nervous/anxious.     Immunization History  Administered Date(s) Administered  . HiB (PRP-OMP) 02/12/2004  . Influenza Split 01/11/2012  . Influenza Whole 01/07/2009, 01/15/2018  . Influenza-Unspecified 01/11/2014, 01/01/2015, 01/29/2016, 02/01/2017  . PPD Test 11/14/2014, 06/03/2015, 06/17/2015  . Pneumococcal Conjugate-13 02/08/2017  . Pneumococcal Polysaccharide-23 04/28/2010  . Tdap 08/24/2011  . Zoster 04/13/2010   Pertinent  Health Maintenance Due  Topic Date Due  . INFLUENZA VACCINE  Completed  . DEXA SCAN  Completed  . PNA vac Low Risk Adult  Completed   Fall Risk  01/04/2018 12/29/2016 11/02/2016 06/20/2015 05/23/2015  Falls in the past year? No No Yes No No  Comment - - Emmi Telephone Survey: data to providers prior to load - -  Number falls in past yr: - - 2 or more - -  Comment - - Emmi Telephone Survey Actual Response = 90 - -  Injury with Fall? - - Yes - -   Functional Status Survey:    Vitals:   07/06/18 0817  BP: (!) 150/64  Pulse: 64  Resp: 16  Temp: 98.1 F (36.7 C)  SpO2: 97%  Weight: 162 lb (73.5 kg)  Height: 5\' 5"  (1.651 m)   Body mass index is 26.96 kg/m. Physical Exam Vitals signs and nursing note reviewed.  Constitutional:      General: She is not in acute distress.    Appearance: Normal appearance. She is not ill-appearing, toxic-appearing or diaphoretic.     Comments: Over weight  HENT:     Head: Normocephalic and atraumatic.     Nose: Nose normal.     Mouth/Throat:     Mouth: Mucous membranes are moist.  Eyes:     Extraocular Movements: Extraocular movements intact.     Conjunctiva/sclera: Conjunctivae normal.     Pupils: Pupils are equal, round, and reactive to light.  Neck:     Musculoskeletal: Normal range of motion and neck supple.  Cardiovascular:     Rate and  Rhythm: Normal rate and regular rhythm.     Heart sounds: No murmur.  Pulmonary:     Effort: Pulmonary effort is normal.     Breath sounds: No wheezing, rhonchi or rales.  Abdominal:     General: There is no distension.  Tenderness: There is no abdominal tenderness. There is no guarding or rebound.  Musculoskeletal: Normal range of motion.     Right lower leg: Edema present.     Left lower leg: Edema present.     Comments: Trace edema BLE. W/c for mobility  Skin:    General: Skin is warm.  Neurological:     General: No focal deficit present.     Mental Status: She is alert.     Cranial Nerves: No cranial nerve deficit.     Motor: No weakness.     Coordination: Coordination normal.     Gait: Gait abnormal.     Comments: Oriented to person and place.   Psychiatric:        Mood and Affect: Mood normal.     Labs reviewed: Recent Labs    09/21/17 11/23/17 05/19/18  NA 143 141 141  K 3.5 3.9 3.7  CL 111 108  --   CO2 28 27  --   BUN 15 16 13   CREATININE 0.8 0.8 0.7  CALCIUM 9.3 9.4  --   MG  --  1.9  --    Recent Labs    11/23/17 05/19/18  AST 17 18  ALT 15 16  ALKPHOS 50  --   PROT 5.4  --   ALBUMIN 3.2  --    Recent Labs    07/13/17 11/23/17 05/19/18  WBC 7.4 7.3  --   HGB 12.4 12.7 13.0  HCT 36 37 38  PLT 385 347 351   Lab Results  Component Value Date   TSH 1.90 06/24/2017   Lab Results  Component Value Date   HGBA1C 4.4 11/14/2015   Lab Results  Component Value Date   CHOL 176 05/15/2014   HDL 62 05/15/2014   LDLCALC 95 05/15/2014   LDLDIRECT 114.3 12/08/2007   TRIG 93 05/15/2014   CHOLHDL 2.9 CALC 12/08/2007    Significant Diagnostic Results in last 30 days:  No results found.  Assessment/Plan Essential hypertension mild elevated Sbp in 150s, asymptomatic, will continue Losartan 100mg  qd, Amlodipine 2.5mg  qd.  GERD (gastroesophageal reflux disease) stable on Famotidine 10mg  qd, prn Maalox/Mylanta 30mg  q4hr.   Moderate dementia  without behavioral disturbance ([place]) Continue SNF [facility] for safety and care assistance, continue Donepezil 10mg  qd to preserve memory.  PVD (peripheral vascular disease) ([facility]) BLE edema, chronic, continue  Furosemide 40mg  qd.  Overactive bladder no urinary retention, continue  Mirabegron 25mg  qd.  Chronic midline low back pain without sciatica Lower back pain is manage, continue Tylenol 500mg  tid, q8h prn, and w/c for mobility.      Family/ staff Communication: plan of care reviewed with the patient and charge nurse.   Labs/tests ordered:  none  Time spend 25 minutes.

## 2018-07-06 NOTE — Assessment & Plan Note (Signed)
no urinary retention, continue  Mirabegron 25mg  qd.

## 2018-07-06 NOTE — Assessment & Plan Note (Signed)
stable on Famotidine 10mg  qd, prn Maalox/Mylanta 30mg  q4hr.

## 2018-07-28 ENCOUNTER — Encounter: Payer: Self-pay | Admitting: Nurse Practitioner

## 2018-07-28 ENCOUNTER — Non-Acute Institutional Stay (SKILLED_NURSING_FACILITY): Payer: Medicare Other | Admitting: Nurse Practitioner

## 2018-07-28 DIAGNOSIS — I872 Venous insufficiency (chronic) (peripheral): Secondary | ICD-10-CM

## 2018-07-28 DIAGNOSIS — I739 Peripheral vascular disease, unspecified: Secondary | ICD-10-CM

## 2018-07-28 DIAGNOSIS — F0390 Unspecified dementia without behavioral disturbance: Secondary | ICD-10-CM

## 2018-07-28 DIAGNOSIS — F03B0 Unspecified dementia, moderate, without behavioral disturbance, psychotic disturbance, mood disturbance, and anxiety: Secondary | ICD-10-CM

## 2018-07-28 NOTE — Assessment & Plan Note (Signed)
Continue SNF [facility] for safety and care assistance, continue Donepezil 5mg  qd for memory.

## 2018-07-28 NOTE — Progress Notes (Signed)
Location:  [facility] Room Number: 3 Place of Service:  SNF (31) Provider:  Marlana Latus  NP  Adib Wahba X, NP  Patient Care Team: Nishant Schrecengost X, NP as PCP - General (Internal Medicine) Diamonte Stavely X, NP as Nurse Practitioner (Nurse Practitioner)  Extended Emergency Contact Information Primary Emergency Contact: [place] Address: [place], [place] [address] [name] of [place] Phone: [phone] Work Phone: [phone] Mobile Phone: [phone] Relation: Daughter Secondary Emergency Contact: [name] Address: [address] [place] [address] [name] of [place] Phone: [phone] Mobile Phone: [phone] Relation: Son    Code Status:  DNR Goals of care: Advanced Directive information Advanced Directives 07/28/2018  Does Patient Have a Medical Advance Directive? Yes  Type of Paramedic of [place];Living will;Out of facility DNR (pink MOST or yellow form)  Does patient want to make changes to medical advance directive? No - Patient declined  Copy of [place] in Chart? Yes - validated most recent copy scanned in chart (See row information)  Pre-existing out of facility DNR order (yellow form or pink MOST form) Yellow form placed in chart (order not valid for inpatient use)     Chief Complaint  Patient presents with  . Acute Visit    C/o - swelling in BLE, scratched injuries    HPI:  Pt is a 85 y.o. female seen today for an acute visit for scratched injuries BLE, staff reported weeping and 3+ pitting edema lower right extremity. HPI was provided with assistance of staff. She resides in [facility] for safety and care assistance, on Donepezil 5mg  qd for memory. Hx of PVD, on Furosemide 40mg  qd,    Past Medical History:  Diagnosis Date  . Abnormality of gait 04/28/2010   History of fall onto right shoulder-has participated in PT.  Uses walker.     . Acute encephalopathy 10/25/14   Occurred when septic  . Acute lower GI bleeding 10/30/2014  . Adjustment disorder with mixed anxiety and depressed mood 06/04/2015   11/15/15 Hgb 12.8, Na 139, K 4.2, Bun 14, creat 0.68, TP 5.7, albumin 3.5, TSH 1.54, Hgb a1c 4.4   . Anemia   . Aortic atherosclerosis ([place]) 10/27/2014  . Asthma   . Cerebral atrophy ([place]) 10/27/2014  . Cerebrovascular disease 10/27/2014   Small vessel disease  . Cervical disc disease   . Chronic lower back pain 06/04/2015   lumbar spondylosis. Regular f/u Dr. French Ana. Receives injections last 01/2013.  05/27/15 Xray left hip/pelvis: spondylotic changes, severe osteoarthritis Continue Tylenol and Tramadol prn for pain, observe the patient, adding Cymbalta may help. Mid to lower back pain more on the right side back, may consider X-ray of thoracic spine. 06/01/15 CT thoracic spine showed no acute changes.  Patient received 10 injection in the back through Dr. Alvester Morin office. She says the previous problems of discomfort in the back and down the leg have completely resolved. Takes Aleve 220mg  bid   . Debility 06/20/2015   11/15/15 Hgb 12.8, Na 139, K 4.2, Bun 14, creat 0.68, TP 5.7, albumin 3.5, TSH 1.54, Hgb a1c 4.4   . Degenerative disc disease, lumbar 10/27/2014  . Depression   . Dermatitis 03/25/2016  . Diarrhea 10/25/14  . Diverticulosis 10/27/2014  . Essential hypertension 09/20/2006   11/15/15 Hgb 12.8, Na 139, K 4.2, Bun 14,  creat 0.68, TP 5.7, albumin 3.5, TSH 1.54, Hgb a1c 4.4   . Fall at nursing home 10/25/2014  . GERD (gastroesophageal reflux disease)   . History of cardiovascular disorder 09/20/2006   X 3 in 2000 now on aspirin.    Marland Kitchen Hyperlipidemia 09/20/2006   05/15/14 LDL 95   . Hypertension   . Lumbosacral spondylosis 10/28/2009  . OSTEOPOROSIS 09/20/2006   Noted 2008    . Overactive bladder 11/29/2013   Myrbetriq prescribed by urology.    . Protein calorie malnutrition ([place])    06/06/15 TP 6.0 albumin 2.9  .  SCOLIOSIS 10/28/2009  . Scoliosis 10/27/2014  . Thrombocytosis ([place]) 05/31/2015   06/10/15 plt 602 07/23/15 plt 335 11/15/15 Hgb 12.8, Na 139, K 4.2, Bun 14, creat 0.68, TP 5.7, albumin 3.5, TSH 1.54, Hgb a1c 4.4  . TIA (transient ischemic attack) 09/20/2006   Past Surgical History:  Procedure Laterality Date  . ABDOMINAL HYSTERECTOMY    . APPENDECTOMY    . CHOLECYSTECTOMY    . TONSILLECTOMY      Allergies  Allergen Reactions  . Tramadol Other (See Comments)    Hallucinations, agitation/combativeness  . Amoxicillin     Per MAR  . Oxycodone Other (See Comments)    Per MAR   . Sulfamethoxazole     Per MAR   . Penicillins Rash    Outpatient Encounter Medications as of 07/28/2018  Medication Sig  . acetaminophen (TYLENOL) 500 MG tablet Take 500 mg by mouth 3 (three) times daily.  Marland Kitchen acetaminophen (TYLENOL) 500 MG tablet Take 500 mg by mouth every 8 (eight) hours as needed.  Marland Kitchen albuterol (PROVENTIL HFA;VENTOLIN HFA) 108 (90 BASE) MCG/ACT inhaler Inhale 2 puffs into the lungs every 6 (six) hours as needed for wheezing or shortness of breath.  Marland Kitchen alendronate (FOSAMAX) 70 MG tablet Take 70 mg by mouth once a week. Take with a full glass of water on an empty stomach. On Monday.  Marland Kitchen alum & mag hydroxide-simeth (MAALOX/MYLANTA) 200-200-20 MG/5ML suspension Take 30 mLs by mouth every 4 (four) hours as needed for indigestion or heartburn.   Marland Kitchen amLODipine (NORVASC) 2.5 MG tablet Take 2.5 mg by mouth daily. Hold if SBP <110  . calcium carbonate (OSCAL) 1500 (600 Ca) MG TABS tablet Take 600 mg of elemental calcium by mouth 2 (two) times daily with a meal.  . camphor-menthol (SARNA) lotion Apply 1 application topically as needed. To arms in am and legs at bedtime . Can also use as needed.  . cetaphil (CETAPHIL) cream Apply 1 application topically daily.  . clobetasol cream (TEMOVATE) 2.77 % Apply 1 application topically 2 (two) times daily as needed.  . donepezil (ARICEPT) 5 MG tablet Take 10 mg by mouth at  bedtime. Take 2 tablets = 10 mg  . famotidine (PEPCID) 10 MG tablet Take 10 mg by mouth at bedtime.  . furosemide (LASIX) 20 MG tablet Take 40 mg by mouth daily.   Marland Kitchen losartan (COZAAR) 100 MG tablet Take 100 mg by mouth daily.  . Menthol, Topical Analgesic, (BIOFREEZE) 4 % GEL Apply topically 4 (four) times daily.  . Mirabegron ER (MYRBETRIQ) 25 MG TB24 Take 25 mg by mouth daily with breakfast. Prescribed by urology  . nitroGLYCERIN (NITROSTAT) 0.4 MG SL tablet Place 0.4 mg under the tongue every 5 (five) minutes as needed for chest pain. After taking three times and no relief call MD.  . polyethylene glycol (MIRALAX / GLYCOLAX) packet Take 17 g by mouth daily as needed  for moderate constipation.   . potassium chloride (MICRO-K) 10 MEQ CR capsule Take 20 mEq by mouth daily.   Marland Kitchen pyridOXINE (VITAMIN B-6) 100 MG tablet Take 100 mg by mouth daily with breakfast.    No facility-administered encounter medications on file as of 07/28/2018.    ROS was provided with assistance of staff.  Review of Systems  Constitutional: Negative for activity change, appetite change, chills, diaphoresis, fatigue, fever and unexpected weight change.  HENT: Positive for hearing loss. Negative for congestion and voice change.   Respiratory: Negative for cough, shortness of breath and wheezing.   Cardiovascular: Positive for leg swelling. Negative for chest pain and palpitations.  Gastrointestinal: Negative for abdominal distention, abdominal pain, constipation, diarrhea, nausea and vomiting.  Genitourinary: Negative for difficulty urinating, dysuria and urgency.  Musculoskeletal: Positive for back pain and gait problem.  Skin: Positive for wound.       Scratched injury BLE.   Neurological: Negative for dizziness, speech difficulty, weakness and headaches.       Dementia.   Psychiatric/Behavioral: Negative for agitation, behavioral problems, hallucinations and sleep disturbance. The patient is not nervous/anxious.      Immunization History  Administered Date(s) Administered  . HiB (PRP-OMP) 02/12/2004  . Influenza Split 01/11/2012  . Influenza Whole 01/07/2009, 01/15/2018  . Influenza-Unspecified 01/11/2014, 01/01/2015, 01/29/2016, 02/01/2017  . PPD Test 11/14/2014, 06/03/2015, 06/17/2015  . Pneumococcal Conjugate-13 02/08/2017  . Pneumococcal Polysaccharide-23 04/28/2010  . Tdap 08/24/2011  . Zoster 04/13/2010   Pertinent  Health Maintenance Due  Topic Date Due  . INFLUENZA VACCINE  11/12/2018  . DEXA SCAN  Completed  . PNA vac Low Risk Adult  Completed   Fall Risk  01/04/2018 12/29/2016 11/02/2016 06/20/2015 05/23/2015  Falls in the past year? No No Yes No No  Comment - - Emmi Telephone Survey: data to providers prior to load - -  Number falls in past yr: - - 2 or more - -  Comment - - Emmi Telephone Survey Actual Response = 90 - -  Injury with Fall? - - Yes - -   Functional Status Survey:    Vitals:   07/28/18 1157  BP: 140/82  Pulse: 70  Resp: (!) 22  Temp: (!) 97.4 F (36.3 C)  SpO2: 95%  Weight: 161 lb (73 kg)  Height: 5\' 5"  (1.651 m)   Body mass index is 26.79 kg/m. Physical Exam Vitals signs and nursing note reviewed.  Constitutional:      General: She is not in acute distress.    Appearance: Normal appearance. She is not ill-appearing, toxic-appearing or diaphoretic.  HENT:     Head: Normocephalic and atraumatic.     Nose: Nose normal.     Mouth/Throat:     Mouth: Mucous membranes are moist.  Eyes:     Extraocular Movements: Extraocular movements intact.     Conjunctiva/sclera: Conjunctivae normal.     Pupils: Pupils are equal, round, and reactive to light.  Neck:     Musculoskeletal: Normal range of motion and neck supple.  Cardiovascular:     Rate and Rhythm: Normal rate and regular rhythm.     Heart sounds: No murmur.  Pulmonary:     Effort: Pulmonary effort is normal.     Breath sounds: No wheezing, rhonchi or rales.  Abdominal:     Palpations: Abdomen is soft.      Tenderness: There is no abdominal tenderness. There is no guarding or rebound.  Musculoskeletal:     Right lower leg:  Edema present.     Left lower leg: Edema present.     Comments: Chronic BLE edema 1+  Skin:    General: Skin is warm.     Findings: Erythema present.     Comments: Scratched injuries BLE, no s/s of infection. Mild erythema BLE  Neurological:     General: No focal deficit present.     Mental Status: She is alert. Mental status is at baseline.     Cranial Nerves: No cranial nerve deficit.     Motor: No weakness.     Coordination: Coordination normal.     Gait: Gait abnormal.     Comments: Oriented to person and place.   Psychiatric:        Mood and Affect: Mood normal.        Behavior: Behavior normal.     Labs reviewed: Recent Labs    09/21/17 11/23/17 05/19/18  NA 143 141 141  K 3.5 3.9 3.7  CL 111 108  --   CO2 28 27  --   BUN 15 16 13   CREATININE 0.8 0.8 0.7  CALCIUM 9.3 9.4  --   MG  --  1.9  --    Recent Labs    11/23/17 05/19/18  AST 17 18  ALT 15 16  ALKPHOS 50  --   PROT 5.4  --   ALBUMIN 3.2  --    Recent Labs    11/23/17 05/19/18  WBC 7.3  --   HGB 12.7 13.0  HCT 37 38  PLT 347 351   Lab Results  Component Value Date   TSH 1.90 06/24/2017   Lab Results  Component Value Date   HGBA1C 4.4 11/14/2015   Lab Results  Component Value Date   CHOL 176 05/15/2014   HDL 62 05/15/2014   LDLCALC 95 05/15/2014   LDLDIRECT 114.3 12/08/2007   TRIG 93 05/15/2014   CHOLHDL 2.9 CALC 12/08/2007    Significant Diagnostic Results in last 30 days:  No results found.  Assessment/Plan Venous stasis dermatitis In setting of chronic PVD/edema BLE, scratched injuries noted, will apply 1% Hydrocortisone lotions bid to BLE x 2 weeks, ACE wraps BLE on am, off pm. The patient refused TED in the past. No s/s of infection. Observe.   PVD (peripheral vascular disease) ([place]) Chronic trace to 1+ edema BLE, she denied pain in calves. Will continue  Furosemide 40mg  qd. Weight 2x/week.   Moderate dementia without behavioral disturbance ([place]) Continue SNF [facility] for safety and care assistance, continue Donepezil 5mg  qd for memory.      Family/ staff Communication: plan of care reviewed with the patient and charge nurse.   Labs/tests ordered:  none  Time spend 25 minutes.

## 2018-07-28 NOTE — Assessment & Plan Note (Signed)
Chronic trace to 1+ edema BLE, she denied pain in calves. Will continue Furosemide 40mg  qd. Weight 2x/week.

## 2018-07-28 NOTE — Assessment & Plan Note (Signed)
In setting of chronic PVD/edema BLE, scratched injuries noted, will apply 1% Hydrocortisone lotions bid to BLE x 2 weeks, ACE wraps BLE on am, off pm. The patient refused TED in the past. No s/s of infection. Observe.

## 2018-08-03 ENCOUNTER — Non-Acute Institutional Stay (SKILLED_NURSING_FACILITY): Payer: Medicare Other | Admitting: Nurse Practitioner

## 2018-08-03 ENCOUNTER — Encounter: Payer: Self-pay | Admitting: Nurse Practitioner

## 2018-08-03 DIAGNOSIS — M159 Polyosteoarthritis, unspecified: Secondary | ICD-10-CM

## 2018-08-03 DIAGNOSIS — M150 Primary generalized (osteo)arthritis: Secondary | ICD-10-CM

## 2018-08-03 DIAGNOSIS — N3281 Overactive bladder: Secondary | ICD-10-CM

## 2018-08-03 DIAGNOSIS — I739 Peripheral vascular disease, unspecified: Secondary | ICD-10-CM | POA: Diagnosis not present

## 2018-08-03 DIAGNOSIS — I10 Essential (primary) hypertension: Secondary | ICD-10-CM

## 2018-08-03 NOTE — Assessment & Plan Note (Signed)
ower back pain, continue Tylenol 500 tid.

## 2018-08-03 NOTE — Assessment & Plan Note (Signed)
blood pressure is controlled, continue  Amlodipine 2.5mg  qd, Losartan 100mg  qd.

## 2018-08-03 NOTE — Assessment & Plan Note (Signed)
no urinary retention, continue  Mirabegron 25mg  qd,

## 2018-08-03 NOTE — Progress Notes (Signed)
Location:  [facility] Room Number: 3 Place of Service:  SNF (31) Provider:  Brittani Purdum< Lennie Odor  NP  Renea Schoonmaker X, NP  Patient Care Team: Tharon Kitch X, NP as PCP - General (Internal Medicine) Dannah Ryles X, NP as Nurse Practitioner (Nurse Practitioner)  Extended Emergency Contact Information Primary Emergency Contact: Gambrills Address: [place], [place] [address] [name] of [place] Phone: [phone] Work Phone: [phone] Mobile Phone: [phone] Relation: Daughter Secondary Emergency Contact: [name] Address: [address] [place] [address] [name] of [place] Phone: [phone] Mobile Phone: [phone] Relation: Son  Code Status:  DNR Goals of care: Advanced Directive information Advanced Directives 07/28/2018  Does Patient Have a Medical Advance Directive? Yes  Type of Paramedic of [place];Living will;Out of facility DNR (pink MOST or yellow form)  Does patient want to make changes to medical advance directive? No - Patient declined  Copy of [place] in Chart? Yes - validated most recent copy scanned in chart (See row information)  Pre-existing out of facility DNR order (yellow form or pink MOST form) Yellow form placed in chart (order not valid for inpatient use)     Chief Complaint  Patient presents with  . Medical Management of Chronic Issues    HPI:  Pt is a 85 y.o. female seen today for medical management of chronic diseases.     The patient resides in SNF [facility] for safety and care assistance, w/c for mobility, on Donepezil 10mg  qd for memory. HTN, blood pressure is controlled on Amlodipine 2.5mg  qd, Losartan 100mg  qd. Chronic edema BLE remains no change, on Furosemide 40mg  qd. OBA, no urinary retention, on Mirabegron 25mg  qd, lower back pain, on Tylenol 500 tid.   Past Medical History:  Diagnosis Date  .  Abnormality of gait 04/28/2010   History of fall onto right shoulder-has participated in PT. Uses walker.     . Acute encephalopathy 10/25/14   Occurred when septic  . Acute lower GI bleeding 10/30/2014  . Adjustment disorder with mixed anxiety and depressed mood 06/04/2015   11/15/15 Hgb 12.8, Na 139, K 4.2, Bun 14, creat 0.68, TP 5.7, albumin 3.5, TSH 1.54, Hgb a1c 4.4   . Anemia   . Aortic atherosclerosis ([place]) 10/27/2014  . Asthma   . Cerebral atrophy ([place]) 10/27/2014  . Cerebrovascular disease 10/27/2014   Small vessel disease  . Cervical disc disease   . Chronic lower back pain 06/04/2015   lumbar spondylosis. Regular f/u Dr. French Ana. Receives injections last 01/2013.  05/27/15 Xray left hip/pelvis: spondylotic changes, severe osteoarthritis Continue Tylenol and Tramadol prn for pain, observe the patient, adding Cymbalta may help. Mid to lower back pain more on the right side back, may consider X-ray of thoracic spine. 06/01/15 CT thoracic spine showed no acute changes.  Patient received 10 injection in the back through Dr. Alvester Morin office. She says the previous problems of discomfort in the back and down the leg have completely resolved. Takes Aleve 220mg  bid   . Debility 06/20/2015   11/15/15 Hgb 12.8, Na 139, K 4.2, Bun 14, creat 0.68, TP 5.7, albumin 3.5, TSH 1.54, Hgb a1c 4.4   . Degenerative disc disease, lumbar 10/27/2014  . Depression   . Dermatitis 03/25/2016  . Diarrhea 10/25/14  . Diverticulosis 10/27/2014  . Essential hypertension 09/20/2006  11/15/15 Hgb 12.8, Na 139, K 4.2, Bun 14, creat 0.68, TP 5.7, albumin 3.5, TSH 1.54, Hgb a1c 4.4   . Fall at nursing home 10/25/2014  . GERD (gastroesophageal reflux disease)   . History of cardiovascular disorder 09/20/2006   X 3 in 2000 now on aspirin.    Marland Kitchen Hyperlipidemia 09/20/2006   05/15/14 LDL 95   . Hypertension   . Lumbosacral spondylosis 10/28/2009  . OSTEOPOROSIS 09/20/2006   Noted 2008    . Overactive bladder 11/29/2013   Myrbetriq  prescribed by urology.    . Protein calorie malnutrition ([place])    06/06/15 TP 6.0 albumin 2.9  . SCOLIOSIS 10/28/2009  . Scoliosis 10/27/2014  . Thrombocytosis ([place]) 05/31/2015   06/10/15 plt 602 07/23/15 plt 335 11/15/15 Hgb 12.8, Na 139, K 4.2, Bun 14, creat 0.68, TP 5.7, albumin 3.5, TSH 1.54, Hgb a1c 4.4  . TIA (transient ischemic attack) 09/20/2006   Past Surgical History:  Procedure Laterality Date  . ABDOMINAL HYSTERECTOMY    . APPENDECTOMY    . CHOLECYSTECTOMY    . TONSILLECTOMY      Allergies  Allergen Reactions  . Tramadol Other (See Comments)    Hallucinations, agitation/combativeness  . Amoxicillin     Per MAR  . Oxycodone Other (See Comments)    Per MAR   . Sulfamethoxazole     Per MAR   . Penicillins Rash    Outpatient Encounter Medications as of 08/03/2018  Medication Sig  . acetaminophen (TYLENOL) 500 MG tablet Take 500 mg by mouth 3 (three) times daily.  Marland Kitchen acetaminophen (TYLENOL) 500 MG tablet Take 500 mg by mouth every 8 (eight) hours as needed.  Marland Kitchen albuterol (PROVENTIL HFA;VENTOLIN HFA) 108 (90 BASE) MCG/ACT inhaler Inhale 2 puffs into the lungs every 6 (six) hours as needed for wheezing or shortness of breath.  Marland Kitchen alendronate (FOSAMAX) 70 MG tablet Take 70 mg by mouth once a week. Take with a full glass of water on an empty stomach. On Monday.  Marland Kitchen alum & mag hydroxide-simeth (MAALOX/MYLANTA) 200-200-20 MG/5ML suspension Take 30 mLs by mouth every 4 (four) hours as needed for indigestion or heartburn.   Marland Kitchen amLODipine (NORVASC) 2.5 MG tablet Take 2.5 mg by mouth daily. Hold if SBP <110  . calcium carbonate (OSCAL) 1500 (600 Ca) MG TABS tablet Take 600 mg of elemental calcium by mouth 2 (two) times daily with a meal.  . camphor-menthol (SARNA) lotion Apply 1 application topically as needed. To arms in am and legs at bedtime . Can also use as needed.  . cetaphil (CETAPHIL) cream Apply 1 application topically daily.  . clobetasol cream (TEMOVATE) 5.28 % Apply 1 application  topically 2 (two) times daily as needed.  . donepezil (ARICEPT) 5 MG tablet Take 10 mg by mouth at bedtime. Take 2 tablets = 10 mg  . famotidine (PEPCID) 10 MG tablet Take 10 mg by mouth at bedtime.  . furosemide (LASIX) 20 MG tablet Take 40 mg by mouth daily.   . hydrocortisone 1 % lotion Apply 1 application topically 2 (two) times daily. Apply to  BLE  . losartan (COZAAR) 100 MG tablet Take 100 mg by mouth daily.  . Menthol, Topical Analgesic, (BIOFREEZE) 4 % GEL Apply topically 4 (four) times daily.  . Mirabegron ER (MYRBETRIQ) 25 MG TB24 Take 25 mg by mouth daily with breakfast. Prescribed by urology  . nitroGLYCERIN (NITROSTAT) 0.4 MG SL tablet Place 0.4 mg under the tongue every 5 (five) minutes as needed for  chest pain. After taking three times and no relief call MD.  . polyethylene glycol (MIRALAX / GLYCOLAX) packet Take 17 g by mouth daily as needed for moderate constipation.   . potassium chloride (MICRO-K) 10 MEQ CR capsule Take 20 mEq by mouth daily.   Marland Kitchen pyridOXINE (VITAMIN B-6) 100 MG tablet Take 100 mg by mouth daily with breakfast.    No facility-administered encounter medications on file as of 08/03/2018.     Review of Systems  Constitutional: Negative for activity change, appetite change, chills, diaphoresis, fatigue, fever and unexpected weight change.  HENT: Positive for hearing loss. Negative for congestion and voice change.   Respiratory: Negative for cough, shortness of breath and wheezing.   Cardiovascular: Positive for leg swelling. Negative for chest pain and palpitations.  Gastrointestinal: Negative for abdominal distention, abdominal pain, constipation, diarrhea, nausea and vomiting.  Genitourinary: Negative for difficulty urinating, dysuria and urgency.  Musculoskeletal: Positive for back pain and gait problem.  Skin: Negative for color change and pallor.  Neurological: Negative for dizziness, speech difficulty, weakness and headaches.       Dementia   Psychiatric/Behavioral: Negative for agitation, behavioral problems, hallucinations and sleep disturbance. The patient is not nervous/anxious.     Immunization History  Administered Date(s) Administered  . HiB (PRP-OMP) 02/12/2004  . Influenza Split 01/11/2012  . Influenza Whole 01/07/2009, 01/15/2018  . Influenza-Unspecified 01/11/2014, 01/01/2015, 01/29/2016, 02/01/2017  . PPD Test 11/14/2014, 06/03/2015, 06/17/2015  . Pneumococcal Conjugate-13 02/08/2017  . Pneumococcal Polysaccharide-23 04/28/2010  . Tdap 08/24/2011  . Zoster 04/13/2010   Pertinent  Health Maintenance Due  Topic Date Due  . INFLUENZA VACCINE  11/12/2018  . DEXA SCAN  Completed  . PNA vac Low Risk Adult  Completed   Fall Risk  01/04/2018 12/29/2016 11/02/2016 06/20/2015 05/23/2015  Falls in the past year? No No Yes No No  Comment - - Emmi Telephone Survey: data to providers prior to load - -  Number falls in past yr: - - 2 or more - -  Comment - - Emmi Telephone Survey Actual Response = 90 - -  Injury with Fall? - - Yes - -   Functional Status Survey:    Vitals:   08/03/18 0828  BP: 140/80  Pulse: 70  Resp: (!) 22  Temp: 98.2 F (36.8 C)  SpO2: 95%  Weight: 159 lb (72.1 kg)  Height: 5\' 5"  (1.651 m)   Body mass index is 26.46 kg/m. Physical Exam Constitutional:      General: She is not in acute distress.    Appearance: Normal appearance. She is not ill-appearing, toxic-appearing or diaphoretic.     Comments: Over weight  HENT:     Head: Normocephalic and atraumatic.     Nose: Nose normal.     Mouth/Throat:     Mouth: Mucous membranes are moist.  Eyes:     Extraocular Movements: Extraocular movements intact.     Conjunctiva/sclera: Conjunctivae normal.     Pupils: Pupils are equal, round, and reactive to light.  Neck:     Musculoskeletal: Normal range of motion and neck supple.  Cardiovascular:     Rate and Rhythm: Normal rate and regular rhythm.     Heart sounds: No murmur.  Pulmonary:      Effort: Pulmonary effort is normal.     Breath sounds: No wheezing, rhonchi or rales.  Abdominal:     Palpations: Abdomen is soft.     Tenderness: There is no abdominal tenderness. There is no right  CVA tenderness, left CVA tenderness, guarding or rebound.  Musculoskeletal:     Right lower leg: Edema present.     Left lower leg: Edema present.     Comments: Trace to 1+ edema BLE  Skin:    General: Skin is warm and dry.     Comments: A few cattered scratched skin injuries, left upper leg near knee, left hand, no s/s of infection.   Neurological:     General: No focal deficit present.     Mental Status: She is alert. Mental status is at baseline.     Cranial Nerves: No cranial nerve deficit.     Motor: No weakness.     Coordination: Coordination normal.     Gait: Gait abnormal.     Comments: Oriented to self.  Psychiatric:        Mood and Affect: Mood normal.        Behavior: Behavior normal.     Labs reviewed: Recent Labs    09/21/17 11/23/17 05/19/18  NA 143 141 141  K 3.5 3.9 3.7  CL 111 108  --   CO2 28 27  --   BUN 15 16 13   CREATININE 0.8 0.8 0.7  CALCIUM 9.3 9.4  --   MG  --  1.9  --    Recent Labs    11/23/17 05/19/18  AST 17 18  ALT 15 16  ALKPHOS 50  --   PROT 5.4  --   ALBUMIN 3.2  --    Recent Labs    11/23/17 05/19/18  WBC 7.3  --   HGB 12.7 13.0  HCT 37 38  PLT 347 351   Lab Results  Component Value Date   TSH 1.90 06/24/2017   Lab Results  Component Value Date   HGBA1C 4.4 11/14/2015   Lab Results  Component Value Date   CHOL 176 05/15/2014   HDL 62 05/15/2014   LDLCALC 95 05/15/2014   LDLDIRECT 114.3 12/08/2007   TRIG 93 05/15/2014   CHOLHDL 2.9 CALC 12/08/2007    Significant Diagnostic Results in last 30 days:  No results found.  Assessment/Plan Essential hypertension blood pressure is controlled, continue  Amlodipine 2.5mg  qd, Losartan 100mg  qd.  PVD (peripheral vascular disease) (HCC) edema BLE remains no change, continue  Furosemide 40mg  qd.   Overactive bladder no urinary retention, continue  Mirabegron 25mg  qd,   Osteoarthritis (arthritis due to wear and tear of joints) ower back pain, continue Tylenol 500 tid.        Family/ staff Communication: plan of care reviewed with the patient and charge nurse.   Labs/tests ordered:  none  Time spend 25 minutes.

## 2018-08-03 NOTE — Assessment & Plan Note (Signed)
edema BLE remains no change, continue Furosemide 40mg  qd.

## 2018-08-30 ENCOUNTER — Non-Acute Institutional Stay (SKILLED_NURSING_FACILITY): Payer: Medicare Other | Admitting: Internal Medicine

## 2018-08-30 ENCOUNTER — Encounter: Payer: Self-pay | Admitting: Internal Medicine

## 2018-08-30 DIAGNOSIS — N3281 Overactive bladder: Secondary | ICD-10-CM

## 2018-08-30 DIAGNOSIS — F0390 Unspecified dementia without behavioral disturbance: Secondary | ICD-10-CM | POA: Diagnosis not present

## 2018-08-30 DIAGNOSIS — I10 Essential (primary) hypertension: Secondary | ICD-10-CM | POA: Diagnosis not present

## 2018-08-30 DIAGNOSIS — F03B0 Unspecified dementia, moderate, without behavioral disturbance, psychotic disturbance, mood disturbance, and anxiety: Secondary | ICD-10-CM

## 2018-08-30 DIAGNOSIS — M810 Age-related osteoporosis without current pathological fracture: Secondary | ICD-10-CM | POA: Diagnosis not present

## 2018-08-30 NOTE — Progress Notes (Signed)
Location:  [facility] [facility] Room Number: 3/A Place of Service:  SNF (31) Provider:  L,MD   [name], [name], NP  Patient Care Team: [name], [name], NP as PCP - General (Internal Medicine) [name], [name], NP as Nurse Practitioner (Nurse Practitioner)  Extended Emergency Contact Information Primary Emergency Contact: Fowler Address: [place], [place] [address] [name] of [place] Phone: [phone] Work Phone: [phone] Mobile Phone: [phone] Relation: Daughter Secondary Emergency Contact: [name] Address: [address] [place] [address] [name] of [place] Phone: [phone] Mobile Phone: [phone] Relation: Son  Code Status: DNR  Goals of care: Advanced Directive information Advanced Directives 08/30/2018  Does Patient Have a Medical Advance Directive? Yes  Type of Paramedic of [place];Living will;Out of facility DNR (pink MOST or yellow form)  Does patient want to make changes to medical advance directive? No - Patient declined  Copy of [place] in Chart? Yes - validated most recent copy scanned in chart (See row information)  Pre-existing out of facility DNR order (yellow form or pink MOST form) Yellow form placed in chart (order not valid for inpatient use)     Chief Complaint  Patient presents with  . Medical Management of Chronic Issues    Routine visit    HPI:  Pt is a 85 y.o. female seen today for medical management of chronic diseases.    Patient has h/o Hypertension, Bilateral LE edema, Lower Back Pain, Osteoporosis urinary Incontinence and Dementia Patient doing well. She did have some issues with lower extremity edema and blisters.  She was getting Kerlix wrap and I looked at her leg with the nurses today.  Everything is resolved and patient has agreed to wear TED hoses. Patient also has some issues  with picking on her skin causing bruises. Patient was unable to give me any detailed history. Per nurses has been stable otherwise. Her weight is stable and her appetite is good.  Denies any cough or shortness of breath.  Past Medical History:  Diagnosis Date  . Abnormality of gait 04/28/2010   History of fall onto right shoulder-has participated in PT. Uses walker.     . Acute encephalopathy 10/25/14   Occurred when septic  . Acute lower GI bleeding 10/30/2014  . Adjustment disorder with mixed anxiety and depressed mood 06/04/2015   11/15/15 Hgb 12.8, Na 139, K 4.2, Bun 14, creat 0.68, TP 5.7, albumin 3.5, TSH 1.54, Hgb a1c 4.4   . Anemia   . Aortic atherosclerosis ([place]) 10/27/2014  . Asthma   . Cerebral atrophy ([place]) 10/27/2014  . Cerebrovascular disease 10/27/2014   Small vessel disease  . Cervical disc disease   . Chronic lower back pain 06/04/2015   lumbar spondylosis. Regular f/u Dr. French Ana. Receives injections last 01/2013.  05/27/15 Xray left hip/pelvis: spondylotic changes, severe osteoarthritis Continue Tylenol and Tramadol prn for pain, observe the patient, adding Cymbalta may help. Mid to lower back pain more on the right side back, may consider X-ray of thoracic spine. 06/01/15 CT thoracic spine showed no acute changes.  Patient received 10 injection in the back through Dr. Alvester Morin office. She says the previous problems of discomfort in the back and down the leg have completely resolved. Takes Aleve 220mg  bid   . Debility 06/20/2015   11/15/15 Hgb 12.8, Na 139, K  4.2, Bun 14, creat 0.68, TP 5.7, albumin 3.5, TSH 1.54, Hgb a1c 4.4   . Degenerative disc disease, lumbar 10/27/2014  . Depression   . Dermatitis 03/25/2016  . Diarrhea 10/25/14  . Diverticulosis 10/27/2014  . Essential hypertension 09/20/2006   11/15/15 Hgb 12.8, Na 139, K 4.2, Bun 14, creat 0.68, TP 5.7, albumin 3.5, TSH 1.54, Hgb a1c 4.4   . Fall at nursing home 10/25/2014  . GERD (gastroesophageal reflux disease)   .  History of cardiovascular disorder 09/20/2006   X 3 in 2000 now on aspirin.    Marland Kitchen Hyperlipidemia 09/20/2006   05/15/14 LDL 95   . Hypertension   . Lumbosacral spondylosis 10/28/2009  . OSTEOPOROSIS 09/20/2006   Noted 2008    . Overactive bladder 11/29/2013   Myrbetriq prescribed by urology.    . Protein calorie malnutrition ([place])    06/06/15 TP 6.0 albumin 2.9  . SCOLIOSIS 10/28/2009  . Scoliosis 10/27/2014  . Thrombocytosis ([place]) 05/31/2015   06/10/15 plt 602 07/23/15 plt 335 11/15/15 Hgb 12.8, Na 139, K 4.2, Bun 14, creat 0.68, TP 5.7, albumin 3.5, TSH 1.54, Hgb a1c 4.4  . TIA (transient ischemic attack) 09/20/2006   Past Surgical History:  Procedure Laterality Date  . ABDOMINAL HYSTERECTOMY    . APPENDECTOMY    . CHOLECYSTECTOMY    . TONSILLECTOMY      Allergies  Allergen Reactions  . Tramadol Other (See Comments)    Hallucinations, agitation/combativeness  . Amoxicillin     Per MAR  . Oxycodone Other (See Comments)    Per MAR   . Sulfamethoxazole     Per MAR   . Penicillins Rash    Outpatient Encounter Medications as of 08/30/2018  Medication Sig  . acetaminophen (TYLENOL) 500 MG tablet Take 500 mg by mouth 3 (three) times daily.  Marland Kitchen acetaminophen (TYLENOL) 500 MG tablet Take 500 mg by mouth every 8 (eight) hours as needed.  Marland Kitchen albuterol (PROVENTIL HFA;VENTOLIN HFA) 108 (90 BASE) MCG/ACT inhaler Inhale 2 puffs into the lungs every 6 (six) hours as needed for wheezing or shortness of breath.  Marland Kitchen alendronate (FOSAMAX) 70 MG tablet Take 70 mg by mouth once a week. Take with a full glass of water on an empty stomach. On Monday.  Marland Kitchen alum & mag hydroxide-simeth (MAALOX/MYLANTA) 200-200-20 MG/5ML suspension Take 30 mLs by mouth every 4 (four) hours as needed for indigestion or heartburn.   Marland Kitchen amLODipine (NORVASC) 2.5 MG tablet Take 2.5 mg by mouth daily. Hold if SBP <110  . calcium carbonate (OSCAL) 1500 (600 Ca) MG TABS tablet Take 600 mg of elemental calcium by mouth 2 (two) times daily with a  meal.  . camphor-menthol (SARNA) lotion Apply 1 application topically as needed. To arms in am and legs at bedtime . Can also use as needed.  . cetaphil (CETAPHIL) cream Apply 1 application topically daily.  . clobetasol cream (TEMOVATE) 3.87 % Apply 1 application topically 2 (two) times daily as needed.  . donepezil (ARICEPT) 10 MG tablet Take 10 mg by mouth at bedtime.  . famotidine (PEPCID) 10 MG tablet Take 10 mg by mouth at bedtime.  . furosemide (LASIX) 20 MG tablet Take 40 mg by mouth daily.   Marland Kitchen losartan (COZAAR) 100 MG tablet Take 100 mg by mouth daily.  . Menthol, Topical Analgesic, (BIOFREEZE) 4 % GEL Apply topically 4 (four) times daily.  . Mirabegron ER (MYRBETRIQ) 25 MG TB24 Take 25 mg by mouth daily with breakfast. Prescribed by urology  .  nitroGLYCERIN (NITROSTAT) 0.4 MG SL tablet Place 0.4 mg under the tongue every 5 (five) minutes as needed for chest pain. After taking three times and no relief call MD.  . polyethylene glycol (MIRALAX / GLYCOLAX) packet Take 17 g by mouth daily as needed for moderate constipation.   . potassium chloride SA (K-DUR) 20 MEQ tablet Take 20 mEq by mouth daily.  Marland Kitchen pyridOXINE (VITAMIN B-6) 100 MG tablet Take 100 mg by mouth daily with breakfast.   . [DISCONTINUED] donepezil (ARICEPT) 5 MG tablet Take 10 mg by mouth at bedtime. Take 2 tablets = 10 mg  . [DISCONTINUED] hydrocortisone 1 % lotion Apply 1 application topically 2 (two) times daily. Apply to  BLE  . [DISCONTINUED] potassium chloride (MICRO-K) 10 MEQ CR capsule Take 20 mEq by mouth daily.    No facility-administered encounter medications on file as of 08/30/2018.     Review of Systems  Unable to perform ROS: Dementia    Immunization History  Administered Date(s) Administered  . HiB (PRP-OMP) 02/12/2004  . Influenza Split 01/11/2012  . Influenza Whole 01/07/2009, 01/15/2018  . Influenza-Unspecified 01/11/2014, 01/01/2015, 01/29/2016, 02/01/2017  . PPD Test 11/14/2014, 06/03/2015,  06/17/2015  . Pneumococcal Conjugate-13 02/08/2017  . Pneumococcal Polysaccharide-23 04/28/2010  . Tdap 08/24/2011  . Zoster 04/13/2010   Pertinent  Health Maintenance Due  Topic Date Due  . INFLUENZA VACCINE  11/12/2018  . DEXA SCAN  Completed  . PNA vac Low Risk Adult  Completed   Fall Risk  01/04/2018 12/29/2016 11/02/2016 06/20/2015 05/23/2015  Falls in the past year? No No Yes No No  Comment - - Emmi Telephone Survey: data to providers prior to load - -  Number falls in past yr: - - 2 or more - -  Comment - - Emmi Telephone Survey Actual Response = 90 - -  Injury with Fall? - - Yes - -   Functional Status Survey:    Vitals:   08/30/18 0838  BP: (!) 142/60  Pulse: 67  Resp: (!) 22  Temp: 97.8 F (36.6 C)  SpO2: 93%  Weight: 160 lb 4.8 oz (72.7 kg)  Height: 5\' 5"  (1.651 m)   Body mass index is 26.68 kg/m. Physical Exam Vitals signs reviewed.  Constitutional:      Appearance: Normal appearance.  HENT:     Head: Normocephalic.     Nose: Nose normal.     Mouth/Throat:     Mouth: Mucous membranes are moist.     Pharynx: Oropharynx is clear.  Eyes:     Pupils: Pupils are equal, round, and reactive to light.  Neck:     Musculoskeletal: Neck supple.  Cardiovascular:     Rate and Rhythm: Normal rate and regular rhythm.     Pulses: Normal pulses.     Heart sounds: Normal heart sounds.  Pulmonary:     Effort: Pulmonary effort is normal. No respiratory distress.     Breath sounds: Normal breath sounds. No wheezing.  Abdominal:     General: Abdomen is flat. Bowel sounds are normal.     Palpations: Abdomen is soft.  Musculoskeletal:     Comments: Mild edema Bilateral no more Blisters  Skin:    General: Skin is warm and dry.  Neurological:     General: No focal deficit present.     Mental Status: She is alert.     Comments: Was not Oriented. Will answer ok for everything. Follows Commands and is mostly Wheelchair Bound    Psychiatric:  Mood and Affect: Mood  normal.        Thought Content: Thought content normal.        Judgment: Judgment normal.     Labs reviewed: Recent Labs    09/21/17 11/23/17 05/19/18  NA 143 141 141  K 3.5 3.9 3.7  CL 111 108  --   CO2 28 27  --   BUN 15 16 13   CREATININE 0.8 0.8 0.7  CALCIUM 9.3 9.4  --   MG  --  1.9  --    Recent Labs    11/23/17 05/19/18  AST 17 18  ALT 15 16  ALKPHOS 50  --   PROT 5.4  --   ALBUMIN 3.2  --    Recent Labs    11/23/17 05/19/18  WBC 7.3  --   HGB 12.7 13.0  HCT 37 38  PLT 347 351   Lab Results  Component Value Date   TSH 1.90 06/24/2017   Lab Results  Component Value Date   HGBA1C 4.4 11/14/2015   Lab Results  Component Value Date   CHOL 176 05/15/2014   HDL 62 05/15/2014   LDLCALC 95 05/15/2014   LDLDIRECT 114.3 12/08/2007   TRIG 93 05/15/2014   CHOLHDL 2.9 CALC 12/08/2007    Significant Diagnostic Results in last 30 days:  No results found.  Assessment/Plan  Essential hypertension Continue on Lasix, Norvasc and Losartan BP stable Leg edema Much Improved. Continue Lasix Ted Hoses Moderate dementia without behavioral disturbance  Continue Aricept Start her on Namenda 5 mg  Age-related osteoporosis  On Fosamax  Chronic midline low back pain  Controlled on Tyelnol   urinary incontinence Continue Mybetriq  Family/ staff Communication:   Labs/tests ordered:    Total time spent in this patient care encounter was  25_  minutes; greater than 50% of the visit spent counseling patient and staff, reviewing records , Labs and coordinating care for problems addressed at this encounter.

## 2018-09-26 ENCOUNTER — Encounter: Payer: Self-pay | Admitting: Nurse Practitioner

## 2018-09-26 NOTE — Progress Notes (Signed)
Opened in Error.

## 2018-09-28 ENCOUNTER — Encounter: Payer: Self-pay | Admitting: Nurse Practitioner

## 2018-09-28 ENCOUNTER — Non-Acute Institutional Stay (SKILLED_NURSING_FACILITY): Payer: Medicare Other | Admitting: Nurse Practitioner

## 2018-09-28 DIAGNOSIS — F0390 Unspecified dementia without behavioral disturbance: Secondary | ICD-10-CM | POA: Diagnosis not present

## 2018-09-28 DIAGNOSIS — I739 Peripheral vascular disease, unspecified: Secondary | ICD-10-CM | POA: Diagnosis not present

## 2018-09-28 DIAGNOSIS — M159 Polyosteoarthritis, unspecified: Secondary | ICD-10-CM

## 2018-09-28 DIAGNOSIS — N3281 Overactive bladder: Secondary | ICD-10-CM | POA: Diagnosis not present

## 2018-09-28 DIAGNOSIS — I10 Essential (primary) hypertension: Secondary | ICD-10-CM

## 2018-09-28 DIAGNOSIS — F03B0 Unspecified dementia, moderate, without behavioral disturbance, psychotic disturbance, mood disturbance, and anxiety: Secondary | ICD-10-CM

## 2018-09-28 DIAGNOSIS — M150 Primary generalized (osteo)arthritis: Secondary | ICD-10-CM

## 2018-09-28 DIAGNOSIS — K219 Gastro-esophageal reflux disease without esophagitis: Secondary | ICD-10-CM | POA: Diagnosis not present

## 2018-09-28 NOTE — Assessment & Plan Note (Addendum)
Chronic edema BLE, small leakage of fluid seen, no s/s of infection, refuses TED often, continue Furosemide 40mg  qd, ACE wrap BLE daily.

## 2018-09-28 NOTE — Assessment & Plan Note (Signed)
Blood pressure is controlled, continue Losartan 100mg  qd, Amlodipine 2.5mg  qd.

## 2018-09-28 NOTE — Assessment & Plan Note (Signed)
Urinary leakage is manage with adult brief, continue Myrbetriq 25mg  qd.

## 2018-09-28 NOTE — Assessment & Plan Note (Signed)
Chronic lower back pain, continue Tylenol 500mg  tid, Alendronate 70mg  qwk, Ca daily, w/c for mobility.

## 2018-09-28 NOTE — Progress Notes (Signed)
Location:   SNF [facility] [facility] Room Number: 3/A Place of Service:  SNF (31) Provider: Lennie Odor Gerrianne Aydelott NP  Esaw Knippel X, NP  Patient Care Team: Corynne Scibilia X, NP as PCP - General (Internal Medicine) Julina Altmann X, NP as Nurse Practitioner (Nurse Practitioner) Virgie Dad, MD as Consulting Physician (Internal Medicine)  Extended Emergency Contact Information Primary Emergency Contact: [name] Address: [place], [place] [address] [name] of [place] Phone: [phone] Work Phone: [phone] Mobile Phone: [phone] Relation: Daughter Secondary Emergency Contact: [name] Address: [address] [place] [address] [name] of [place] Phone: [phone] Mobile Phone: [phone] Relation: Son  Code Status:  DNR Goals of care: Advanced Directive information Advanced Directives 09/28/2018  Does Patient Have a Medical Advance Directive? Yes  Type of Advance Directive Living will;Healthcare Power of [place];Out of facility DNR (pink MOST or yellow form)  Does patient want to make changes to medical advance directive? No - Patient declined  Copy of [place] in Chart? Yes - validated most recent copy scanned in chart (See row information)  Pre-existing out of facility DNR order (yellow form or pink MOST form) Yellow form placed in chart (order not valid for inpatient use);Physician notified to receive inpatient order     Chief Complaint  Patient presents with   Medical Management of Chronic Issues    Routine visit     HPI:  Pt is a 85 y.o. female seen today for medical management of chronic diseases.    The patient has history of HTN, blood pressure is controlled on Losartan 100mg  qd, Amlodipine 2.5mg  qd. Hx of edema BLE, chronic, refuses TED often, on Furosemide 40mg  qd. OAB, urinary leakage, on Mirabegron 25mg  qd. She resides in [facility] for safety and care assistance, on  Memantine 5mg  qd, Donepezil 10mg  qd. GERD, stable on Famotidine 10mg  qd. Lower back pain is treated with Tylenol 500mg  tid, Alendronate 70mg  qd, Ca.    Past Medical History:  Diagnosis Date   Abnormality of gait 04/28/2010   History of fall onto right shoulder-has participated in PT. Uses walker.      Acute encephalopathy 10/25/14   Occurred when septic   Acute lower GI bleeding 10/30/2014   Adjustment disorder with mixed anxiety and depressed mood 06/04/2015   11/15/15 Hgb 12.8, Na 139, K 4.2, Bun 14, creat 0.68, TP 5.7, albumin 3.5, TSH 1.54, Hgb a1c 4.4    Anemia    Aortic atherosclerosis ([place]) 10/27/2014   Asthma    Cerebral atrophy ([place]) 10/27/2014   Cerebrovascular disease 10/27/2014   Small vessel disease   Cervical disc disease    Chronic lower back pain 06/04/2015   lumbar spondylosis. Regular f/u Dr. French Ana. Receives injections last 01/2013.  05/27/15 Xray left hip/pelvis: spondylotic changes, severe osteoarthritis Continue Tylenol and Tramadol prn for pain, observe the patient, adding Cymbalta may help. Mid to lower back pain more on the right side back, may consider X-ray of thoracic spine. 06/01/15 CT thoracic spine showed no acute changes.  Patient received 10 injection in the back through Dr. Alvester Morin office. She says the previous problems of discomfort in the back and down the leg have completely resolved. Takes Aleve 220mg  bid    Debility 06/20/2015   11/15/15 Hgb 12.8, Na 139, K 4.2, Bun 14, creat 0.68, TP 5.7, albumin 3.5, TSH 1.54, Hgb  a1c 4.4    Degenerative disc disease, lumbar 10/27/2014   Depression    Dermatitis 03/25/2016   Diarrhea 10/25/14   Diverticulosis 10/27/2014   Essential hypertension 09/20/2006   11/15/15 Hgb 12.8, Na 139, K 4.2, Bun 14, creat 0.68, TP 5.7, albumin 3.5, TSH 1.54, Hgb a1c 4.4    Fall at nursing home 10/25/2014   GERD (gastroesophageal reflux disease)    History of cardiovascular disorder 09/20/2006   X 3 in 2000 now on aspirin.      Hyperlipidemia 09/20/2006   05/15/14 LDL 95    Hypertension    Lumbosacral spondylosis 10/28/2009   OSTEOPOROSIS 09/20/2006   Noted 2008     Overactive bladder 11/29/2013   Myrbetriq prescribed by urology.     Protein calorie malnutrition ([place])    06/06/15 TP 6.0 albumin 2.9   SCOLIOSIS 10/28/2009   Scoliosis 10/27/2014   Thrombocytosis ([place]) 05/31/2015   06/10/15 plt 602 07/23/15 plt 335 11/15/15 Hgb 12.8, Na 139, K 4.2, Bun 14, creat 0.68, TP 5.7, albumin 3.5, TSH 1.54, Hgb a1c 4.4   TIA (transient ischemic attack) 09/20/2006   Past Surgical History:  Procedure Laterality Date   ABDOMINAL HYSTERECTOMY     APPENDECTOMY     CHOLECYSTECTOMY     TONSILLECTOMY      Allergies  Allergen Reactions   Tramadol Other (See Comments)    Hallucinations, agitation/combativeness   Amoxicillin     Per MAR   Oxycodone Other (See Comments)    Per MAR    Sulfamethoxazole     Per MAR    Penicillins Rash    Allergies as of 09/28/2018      Reactions   Tramadol Other (See Comments)   Hallucinations, agitation/combativeness   Amoxicillin    Per MAR   Oxycodone Other (See Comments)   Per MAR    Sulfamethoxazole    Per MAR    Penicillins Rash      Medication List       Accurate as of September 28, 2018 11:59 PM. If you have any questions, ask your nurse or doctor.        acetaminophen 500 MG tablet Commonly known as: TYLENOL Take 500 mg by mouth 3 (three) times daily.   acetaminophen 500 MG tablet Commonly known as: TYLENOL Take 500 mg by mouth every 8 (eight) hours as needed.   albuterol 108 (90 Base) MCG/ACT inhaler Commonly known as: VENTOLIN HFA Inhale 2 puffs into the lungs every 6 (six) hours as needed for wheezing or shortness of breath.   alendronate 70 MG tablet Commonly known as: FOSAMAX Take 70 mg by mouth once a week. Take with a full glass of water on an empty stomach. On Monday.   alum & mag hydroxide-simeth 200-200-20 MG/5ML suspension Commonly known as:  MAALOX/MYLANTA Take 30 mLs by mouth every 4 (four) hours as needed for indigestion or heartburn.   amLODipine 2.5 MG tablet Commonly known as: NORVASC Take 2.5 mg by mouth daily. Hold if SBP <110   Biofreeze 4 % Gel Generic drug: Menthol (Topical Analgesic) Apply topically 4 (four) times daily.   calcium carbonate 1500 (600 Ca) MG Tabs tablet Commonly known as: OSCAL Take 600 mg of elemental calcium by mouth 2 (two) times daily with a meal.   camphor-menthol lotion Commonly known as: SARNA Apply 1 application topically as needed. To arms in am and legs at bedtime . Can also use as needed.   cetaphil cream Apply 1 application topically daily.  clobetasol cream 0.05 % Commonly known as: TEMOVATE Apply 1 application topically 2 (two) times daily as needed.   donepezil 10 MG tablet Commonly known as: ARICEPT Take 10 mg by mouth at bedtime.   famotidine 10 MG tablet Commonly known as: PEPCID Take 10 mg by mouth at bedtime.   furosemide 20 MG tablet Commonly known as: LASIX Take 40 mg by mouth daily.   losartan 100 MG tablet Commonly known as: COZAAR Take 100 mg by mouth daily.   memantine 5 MG tablet Commonly known as: NAMENDA Take 5 mg by mouth daily.   Myrbetriq 25 MG Tb24 tablet Generic drug: mirabegron ER Take 25 mg by mouth daily with breakfast. Prescribed by urology   nitroGLYCERIN 0.4 MG SL tablet Commonly known as: NITROSTAT Place 0.4 mg under the tongue every 5 (five) minutes as needed for chest pain. After taking three times and no relief call MD.   polyethylene glycol 17 g packet Commonly known as: MIRALAX / GLYCOLAX Take 17 g by mouth daily as needed for moderate constipation.   potassium chloride SA 20 MEQ tablet Commonly known as: K-DUR Take 20 mEq by mouth daily.   pyridOXINE 100 MG tablet Commonly known as: VITAMIN B-6 Take 100 mg by mouth daily with breakfast.      ROS was provided with assistance of staff.  Review of Systems    Constitutional: Negative for activity change, appetite change, chills, diaphoresis, fatigue, fever and unexpected weight change.  HENT: Positive for hearing loss. Negative for congestion and voice change.   Eyes: Negative for visual disturbance.  Respiratory: Negative for cough, shortness of breath and wheezing.   Cardiovascular: Positive for leg swelling. Negative for chest pain and palpitations.  Gastrointestinal: Negative for abdominal distention, abdominal pain, constipation, diarrhea, nausea and vomiting.  Genitourinary: Negative for difficulty urinating, dysuria and urgency.  Musculoskeletal: Positive for arthralgias, back pain and gait problem.  Skin: Negative for color change and pallor.  Neurological: Negative for dizziness, speech difficulty, weakness and headaches.       Dementia  Psychiatric/Behavioral: Negative for agitation, behavioral problems, hallucinations and sleep disturbance. The patient is not nervous/anxious.     Immunization History  Administered Date(s) Administered   HiB (PRP-OMP) 02/12/2004   Influenza Split 01/11/2012   Influenza Whole 01/07/2009, 01/15/2018   Influenza-Unspecified 01/11/2014, 01/01/2015, 01/29/2016, 02/01/2017   PPD Test 11/14/2014, 06/03/2015, 06/17/2015   Pneumococcal Conjugate-13 02/08/2017   Pneumococcal Polysaccharide-23 04/28/2010   Tdap 08/24/2011   Zoster 04/13/2010   Pertinent  Health Maintenance Due  Topic Date Due   INFLUENZA VACCINE  11/12/2018   DEXA SCAN  Completed   PNA vac Low Risk Adult  Completed   Fall Risk  01/04/2018 12/29/2016 11/02/2016 06/20/2015 05/23/2015  Falls in the past year? No No Yes No No  Comment - - Emmi Telephone Survey: data to providers prior to load - -  Number falls in past yr: - - 2 or more - -  Comment - - Emmi Telephone Survey Actual Response = 90 - -  Injury with Fall? - - Yes - -   Functional Status Survey:    Vitals:   09/28/18 1124  BP: 130/70  Pulse: 65  Resp: 19  Temp:  97.7 F (36.5 C)  SpO2: 93%  Weight: 157 lb 11.2 oz (71.5 kg)  Height: 5\' 5"  (1.651 m)   Body mass index is 26.24 kg/m. Physical Exam Vitals signs and nursing note reviewed.  Constitutional:      General: She is  not in acute distress.    Appearance: Normal appearance. She is not ill-appearing, toxic-appearing or diaphoretic.     Comments: Over weight  HENT:     Head: Normocephalic and atraumatic.     Nose: Nose normal. No congestion or rhinorrhea.     Mouth/Throat:     Mouth: Mucous membranes are moist.  Eyes:     Extraocular Movements: Extraocular movements intact.     Conjunctiva/sclera: Conjunctivae normal.     Pupils: Pupils are equal, round, and reactive to light.  Neck:     Musculoskeletal: Normal range of motion and neck supple.  Cardiovascular:     Rate and Rhythm: Normal rate and regular rhythm.     Heart sounds: No murmur.  Pulmonary:     Effort: Pulmonary effort is normal.     Breath sounds: No wheezing, rhonchi or rales.  Abdominal:     General: There is no distension.     Palpations: Abdomen is soft.     Tenderness: There is no abdominal tenderness. There is no right CVA tenderness, left CVA tenderness, guarding or rebound.  Musculoskeletal:     Right lower leg: Edema present.     Left lower leg: Edema present.     Comments: 1+ edema BLE, w/c for mobility.   Skin:    General: Skin is warm and dry.  Neurological:     General: No focal deficit present.     Mental Status: She is alert. Mental status is at baseline.     Cranial Nerves: No cranial nerve deficit.     Motor: No weakness.     Coordination: Coordination normal.     Gait: Gait abnormal.     Comments: Oriented to person and her room on unit.   Psychiatric:        Mood and Affect: Mood normal.        Behavior: Behavior normal.     Labs reviewed: Recent Labs    11/23/17 05/19/18  NA 141 141  K 3.9 3.7  CL 108  --   CO2 27  --   BUN 16 13  CREATININE 0.8 0.7  CALCIUM 9.4  --   MG 1.9   --    Recent Labs    11/23/17 05/19/18  AST 17 18  ALT 15 16  ALKPHOS 50  --   PROT 5.4  --   ALBUMIN 3.2  --    Recent Labs    11/23/17 05/19/18  WBC 7.3  --   HGB 12.7 13.0  HCT 37 38  PLT 347 351   Lab Results  Component Value Date   TSH 1.90 06/24/2017   Lab Results  Component Value Date   HGBA1C 4.4 11/14/2015   Lab Results  Component Value Date   CHOL 176 05/15/2014   HDL 62 05/15/2014   LDLCALC 95 05/15/2014   LDLDIRECT 114.3 12/08/2007   TRIG 93 05/15/2014   CHOLHDL 2.9 CALC 12/08/2007    Significant Diagnostic Results in last 30 days:  No results found.  Assessment/Plan  Essential hypertension Blood pressure is controlled, continue Losartan 100mg  qd, Amlodipine 2.5mg  qd.   PVD (peripheral vascular disease) (HCC) Chronic edema BLE, small leakage of fluid seen, no s/s of infection, refuses TED often, continue Furosemide 40mg  qd, ACE wrap BLE daily.   GERD (gastroesophageal reflux disease) Stable, continue Famotidine 10mg  qd.   Moderate dementia without behavioral disturbance ([place]) Continue SN[facility] for safety and care assistance, continue w/c for mobility, continue Memantine  5mg  qd, Donepezil 10mg  qd for memory.   Osteoarthritis (arthritis due to wear and tear of joints) Chronic lower back pain, continue Tylenol 500mg  tid, Alendronate 70mg  qwk, Ca daily, w/c for mobility.   Overactive bladder Urinary leakage is manage with adult brief, continue Myrbetriq 25mg  qd.    Family/ staff Communication: plan of care reviewed with the patient and charge nurse.   Labs/tests ordered:  none  Time spend 25 minutes

## 2018-09-28 NOTE — Assessment & Plan Note (Signed)
Stable, continue Famotidine 10mg qd.  

## 2018-09-28 NOTE — Assessment & Plan Note (Signed)
Continue SN[facility] for safety and care assistance, continue w/c for mobility, continue Memantine 5mg  qd, Donepezil 10mg  qd for memory.

## 2018-10-11 ENCOUNTER — Encounter: Payer: Self-pay | Admitting: Internal Medicine

## 2018-10-11 ENCOUNTER — Non-Acute Institutional Stay (SKILLED_NURSING_FACILITY): Payer: Medicare Other | Admitting: Internal Medicine

## 2018-10-11 DIAGNOSIS — L97919 Non-pressure chronic ulcer of unspecified part of right lower leg with unspecified severity: Secondary | ICD-10-CM

## 2018-10-11 DIAGNOSIS — I83019 Varicose veins of right lower extremity with ulcer of unspecified site: Secondary | ICD-10-CM | POA: Diagnosis not present

## 2018-10-11 DIAGNOSIS — I739 Peripheral vascular disease, unspecified: Secondary | ICD-10-CM | POA: Diagnosis not present

## 2018-10-11 DIAGNOSIS — R600 Localized edema: Secondary | ICD-10-CM

## 2018-10-11 NOTE — Progress Notes (Signed)
Location:  [place] Room Number: 3/A Place of Service:  SNF (31) Provider:Gupta Anjali L,MD   [name], [name], NP  Patient Care Team: [name], [name], NP as PCP - General (Internal Medicine) [name], [name], NP as Nurse Practitioner (Nurse Practitioner) Virgie Dad, MD as Consulting Physician (Internal Medicine)  Extended Emergency Contact Information Primary Emergency Contact: [place] Address: [place], [place] [address] [name] of [place] Phone: [phone] Work Phone: [phone] Mobile Phone: [phone] Relation: Daughter Secondary Emergency Contact: [name] Address: [address] [place] [address] [name] of [place] Phone: [phone] Mobile Phone: [phone] Relation: Son  Code Status: DNR  Goals of care: Advanced Directive information Advanced Directives 10/11/2018  Does Patient Have a Medical Advance Directive? No  Type of Paramedic of [place];Living will;Out of facility DNR (pink MOST or yellow form)  Does patient want to make changes to medical advance directive? No - Patient declined  Copy of [place] in Chart? Yes - validated most recent copy scanned in chart (See row information)  Would patient like information on creating a medical advance directive? No - Patient declined  Pre-existing out of facility DNR order (yellow form or pink MOST form) Yellow form placed in chart (order not valid for inpatient use);Physician notified to receive inpatient order     Chief Complaint  Patient presents with  . Acute Visit    Infected wound     HPI:  Pt is a 85 y.o. female seen today for an acute visit for    Past Medical History:  Diagnosis Date  . Abnormality of gait 04/28/2010   History of fall onto right shoulder-has participated in PT. Uses walker.     . Acute encephalopathy 10/25/14   Occurred when septic  .  Acute lower GI bleeding 10/30/2014  . Adjustment disorder with mixed anxiety and depressed mood 06/04/2015   11/15/15 Hgb 12.8, Na 139, K 4.2, Bun 14, creat 0.68, TP 5.7, albumin 3.5, TSH 1.54, Hgb a1c 4.4   . Anemia   . Aortic atherosclerosis ([place]) 10/27/2014  . Asthma   . Cerebral atrophy ([place]) 10/27/2014  . Cerebrovascular disease 10/27/2014   Small vessel disease  . Cervical disc disease   . Chronic lower back pain 06/04/2015   lumbar spondylosis. Regular f/u Dr. French Ana. Receives injections last 01/2013.  05/27/15 Xray left hip/pelvis: spondylotic changes, severe osteoarthritis Continue Tylenol and Tramadol prn for pain, observe the patient, adding Cymbalta may help. Mid to lower back pain more on the right side back, may consider X-ray of thoracic spine. 06/01/15 CT thoracic spine showed no acute changes.  Patient received 10 injection in the back through Dr. Alvester Morin office. She says the previous problems of discomfort in the back and down the leg have completely resolved. Takes Aleve 220mg  bid   . Debility 06/20/2015   11/15/15 Hgb 12.8, Na 139, K 4.2, Bun 14, creat 0.68, TP 5.7, albumin 3.5, TSH 1.54, Hgb a1c 4.4   . Degenerative disc disease, lumbar 10/27/2014  . Depression   . Dermatitis 03/25/2016  . Diarrhea 10/25/14  . Diverticulosis 10/27/2014  . Essential hypertension 09/20/2006   11/15/15 Hgb 12.8, Na 139, K 4.2, Bun 14, creat 0.68, TP 5.7, albumin 3.5, TSH 1.54, Hgb a1c 4.4   . Fall at nursing home 10/25/2014  . GERD (  gastroesophageal reflux disease)   . History of cardiovascular disorder 09/20/2006   X 3 in 2000 now on aspirin.    Marland Kitchen Hyperlipidemia 09/20/2006   05/15/14 LDL 95   . Hypertension   . Lumbosacral spondylosis 10/28/2009  . OSTEOPOROSIS 09/20/2006   Noted 2008    . Overactive bladder 11/29/2013   Myrbetriq prescribed by urology.    . Protein calorie malnutrition ([place])    06/06/15 TP 6.0 albumin 2.9  . SCOLIOSIS 10/28/2009  . Scoliosis 10/27/2014  . Thrombocytosis ([place]) 05/31/2015    06/10/15 plt 602 07/23/15 plt 335 11/15/15 Hgb 12.8, Na 139, K 4.2, Bun 14, creat 0.68, TP 5.7, albumin 3.5, TSH 1.54, Hgb a1c 4.4  . TIA (transient ischemic attack) 09/20/2006   Past Surgical History:  Procedure Laterality Date  . ABDOMINAL HYSTERECTOMY    . APPENDECTOMY    . CHOLECYSTECTOMY    . TONSILLECTOMY      Allergies  Allergen Reactions  . Tramadol Other (See Comments)    Hallucinations, agitation/combativeness  . Amoxicillin     Per MAR  . Oxycodone Other (See Comments)    Per MAR   . Sulfamethoxazole     Per MAR   . Penicillins Rash    Outpatient Encounter Medications as of 10/11/2018  Medication Sig  . acetaminophen (TYLENOL) 500 MG tablet Take 500 mg by mouth 3 (three) times daily.  Marland Kitchen acetaminophen (TYLENOL) 500 MG tablet Take 500 mg by mouth every 8 (eight) hours as needed.  Marland Kitchen albuterol (PROVENTIL HFA;VENTOLIN HFA) 108 (90 BASE) MCG/ACT inhaler Inhale 2 puffs into the lungs every 6 (six) hours as needed for wheezing or shortness of breath.  Marland Kitchen alendronate (FOSAMAX) 70 MG tablet Take 70 mg by mouth once a week. Take with a full glass of water on an empty stomach. On Monday.  Marland Kitchen alum & mag hydroxide-simeth (MAALOX/MYLANTA) 200-200-20 MG/5ML suspension Take 30 mLs by mouth every 4 (four) hours as needed for indigestion or heartburn.   Marland Kitchen amLODipine (NORVASC) 2.5 MG tablet Take 2.5 mg by mouth daily. Hold if SBP <110  . calcium carbonate (OSCAL) 1500 (600 Ca) MG TABS tablet Take 600 mg of elemental calcium by mouth 2 (two) times daily with a meal.  . camphor-menthol (SARNA) lotion Apply 1 application topically as needed. To arms in am and legs at bed apply to bilateral lower extremities every night at bedtime for itching   . Can also use as needed.  . cetaphil (CETAPHIL) cream Apply 1 application topically daily.  . clobetasol cream (TEMOVATE) 0.09 % Apply 1 application topically 2 (two) times daily as needed.  . donepezil (ARICEPT) 10 MG tablet Take 10 mg by mouth at bedtime.   . famotidine (PEPCID) 10 MG tablet Take 10 mg by mouth at bedtime.  . furosemide (LASIX) 20 MG tablet Take 40 mg by mouth daily.   Marland Kitchen losartan (COZAAR) 100 MG tablet Take 100 mg by mouth daily.  . memantine (NAMENDA) 5 MG tablet Take 5 mg by mouth daily.  . Menthol, Topical Analgesic, (BIOFREEZE) 4 % GEL Apply topically 4 (four) times daily.  . Mirabegron ER (MYRBETRIQ) 25 MG TB24 Take 25 mg by mouth daily with breakfast. Prescribed by urology  . nitroGLYCERIN (NITROSTAT) 0.4 MG SL tablet Place 0.4 mg under the tongue every 5 (five) minutes as needed for chest pain. After taking three times and no relief call MD.  . polyethylene glycol (MIRALAX / GLYCOLAX) packet Take 17 g by mouth daily as needed for moderate  constipation.   . potassium chloride SA (K-DUR) 20 MEQ tablet Take 20 mEq by mouth daily.  Marland Kitchen pyridOXINE (VITAMIN B-6) 100 MG tablet Take 100 mg by mouth daily with breakfast.    No facility-administered encounter medications on file as of 10/11/2018.     Review of Systems  Immunization History  Administered Date(s) Administered  . HiB (PRP-OMP) 02/12/2004  . Influenza Split 01/11/2012  . Influenza Whole 01/07/2009, 01/15/2018  . Influenza-Unspecified 01/11/2014, 01/01/2015, 01/29/2016, 02/01/2017  . PPD Test 11/14/2014, 06/03/2015, 06/17/2015  . Pneumococcal Conjugate-13 02/08/2017  . Pneumococcal Polysaccharide-23 04/28/2010  . Tdap 08/24/2011  . Zoster 04/13/2010   Pertinent  Health Maintenance Due  Topic Date Due  . INFLUENZA VACCINE  11/12/2018  . DEXA SCAN  Completed  . PNA vac Low Risk Adult  Completed   Fall Risk  01/04/2018 12/29/2016 11/02/2016 06/20/2015 05/23/2015  Falls in the past year? No No Yes No No  Comment - - Emmi Telephone Survey: data to providers prior to load - -  Number falls in past yr: - - 2 or more - -  Comment - - Emmi Telephone Survey Actual Response = 90 - -  Injury with Fall? - - Yes - -   Functional Status Survey:    Vitals:   10/11/18 1228   BP: 130/70  Pulse: 65  Resp: 19  Temp: 98 F (36.7 C)  SpO2: 95%  Weight: 157 lb 11.2 oz (71.5 kg)  Height: 5\' 5"  (1.651 m)   Body mass index is 26.24 kg/m. Physical Exam  Labs reviewed: Recent Labs    11/23/17 05/19/18  NA 141 141  K 3.9 3.7  CL 108  --   CO2 27  --   BUN 16 13  CREATININE 0.8 0.7  CALCIUM 9.4  --   MG 1.9  --    Recent Labs    11/23/17 05/19/18  AST 17 18  ALT 15 16  ALKPHOS 50  --   PROT 5.4  --   ALBUMIN 3.2  --    Recent Labs    11/23/17 05/19/18  WBC 7.3  --   HGB 12.7 13.0  HCT 37 38  PLT 347 351   Lab Results  Component Value Date   TSH 1.90 06/24/2017   Lab Results  Component Value Date   HGBA1C 4.4 11/14/2015   Lab Results  Component Value Date   CHOL 176 05/15/2014   HDL 62 05/15/2014   LDLCALC 95 05/15/2014   LDLDIRECT 114.3 12/08/2007   TRIG 93 05/15/2014   CHOLHDL 2.9 CALC 12/08/2007    Significant Diagnostic Results in last 30 days:  No results found.  Assessment/Plan There are no diagnoses linked to this encounter.   Family/ staff Communication:   Labs/tests ordered:

## 2018-10-11 NOTE — Progress Notes (Signed)
Location:  [facility] Room Number: 3/A Place of Service:  SNF (31) Provider:    [name], [name], NP  Patient Care Team: [name], [name], NP as PCP - General (Internal Medicine) [name], [name], NP as Nurse Practitioner (Nurse Practitioner) Virgie Dad, MD as Consulting Physician (Internal Medicine)  Extended Emergency Contact Information Primary Emergency Contact: [place] Address: [place], [place] [address] [name] of [place] Phone: [phone] Work Phone: [phone] Mobile Phone: [phone] Relation: Daughter Secondary Emergency Contact: [name] Address: [address] [place] [address] [name] of [place] Phone: [phone] Mobile Phone: [phone] Relation: Son  Code Status:   Goals of care: Advanced Directive information Advanced Directives 10/11/2018  Does Patient Have a Medical Advance Directive? No  Type of Paramedic of [place];Living will;Out of facility DNR (pink MOST or yellow form)  Does patient want to make changes to medical advance directive? No - Patient declined  Copy of [place] in Chart? Yes - validated most recent copy scanned in chart (See row information)  Would patient like information on creating a medical advance directive? No - Patient declined  Pre-existing out of facility DNR order (yellow form or pink MOST form) Yellow form placed in chart (order not valid for inpatient use);Physician notified to receive inpatient order     Chief Complaint  Patient presents with  . Acute Visit    Infected wound     HPI:  Pt is a 85 y.o. female seen today for an acute visit for wound Infection  Patient has h/o Hypertension, Bilateral LE edema, Lower Back Pain, Osteoporosis, urinary Incontinence and Dementia  Patient has developed a Venous Ulcer in her Right Lower leg. It is draining and has redness around  it.Pateint has moderate Edema Bilateral They are using Kerlix Wrap to keep swelling Down. She denies any SOB or Cough Weight is stable No Fever or Chills  Past Medical History:  Diagnosis Date  . Abnormality of gait 04/28/2010   History of fall onto right shoulder-has participated in PT. Uses walker.     . Acute encephalopathy 10/25/14   Occurred when septic  . Acute lower GI bleeding 10/30/2014  . Adjustment disorder with mixed anxiety and depressed mood 06/04/2015   11/15/15 Hgb 12.8, Na 139, K 4.2, Bun 14, creat 0.68, TP 5.7, albumin 3.5, TSH 1.54, Hgb a1c 4.4   . Anemia   . Aortic atherosclerosis ([place]) 10/27/2014  . Asthma   . Cerebral atrophy ([place]) 10/27/2014  . Cerebrovascular disease 10/27/2014   Small vessel disease  . Cervical disc disease   . Chronic lower back pain 06/04/2015   lumbar spondylosis. Regular f/u Dr. French Ana. Receives injections last 01/2013.  05/27/15 Xray left hip/pelvis: spondylotic changes, severe osteoarthritis Continue Tylenol and Tramadol prn for pain, observe the patient, adding Cymbalta may help. Mid to lower back pain more on the right side back, may consider X-ray of thoracic spine. 06/01/15 CT thoracic spine showed no acute changes.  Patient received 10 injection in the back through Dr. Alvester Morin office. She says the previous problems of discomfort in the back and down the leg have completely resolved. Takes Aleve 220mg  bid   . Debility 06/20/2015   11/15/15 Hgb 12.8, Na 139, K 4.2, Bun 14, creat 0.68, TP 5.7, albumin 3.5, TSH 1.54, Hgb a1c 4.4   .  Degenerative disc disease, lumbar 10/27/2014  . Depression   . Dermatitis 03/25/2016  . Diarrhea 10/25/14  . Diverticulosis 10/27/2014  . Essential hypertension 09/20/2006   11/15/15 Hgb 12.8, Na 139, K 4.2, Bun 14, creat 0.68, TP 5.7, albumin 3.5, TSH 1.54, Hgb a1c 4.4   . Fall at nursing home 10/25/2014  . GERD (gastroesophageal reflux disease)   . History of cardiovascular disorder 09/20/2006   X 3 in 2000 now on  aspirin.    Marland Kitchen Hyperlipidemia 09/20/2006   05/15/14 LDL 95   . Hypertension   . Lumbosacral spondylosis 10/28/2009  . OSTEOPOROSIS 09/20/2006   Noted 2008    . Overactive bladder 11/29/2013   Myrbetriq prescribed by urology.    . Protein calorie malnutrition ([place])    06/06/15 TP 6.0 albumin 2.9  . SCOLIOSIS 10/28/2009  . Scoliosis 10/27/2014  . Thrombocytosis ([place]) 05/31/2015   06/10/15 plt 602 07/23/15 plt 335 11/15/15 Hgb 12.8, Na 139, K 4.2, Bun 14, creat 0.68, TP 5.7, albumin 3.5, TSH 1.54, Hgb a1c 4.4  . TIA (transient ischemic attack) 09/20/2006   Past Surgical History:  Procedure Laterality Date  . ABDOMINAL HYSTERECTOMY    . APPENDECTOMY    . CHOLECYSTECTOMY    . TONSILLECTOMY      Allergies  Allergen Reactions  . Tramadol Other (See Comments)    Hallucinations, agitation/combativeness  . Amoxicillin     Per MAR  . Oxycodone Other (See Comments)    Per MAR   . Sulfamethoxazole     Per MAR   . Penicillins Rash    Outpatient Encounter Medications as of 10/11/2018  Medication Sig  . acetaminophen (TYLENOL) 500 MG tablet Take 500 mg by mouth 3 (three) times daily.  Marland Kitchen acetaminophen (TYLENOL) 500 MG tablet Take 500 mg by mouth every 8 (eight) hours as needed.  Marland Kitchen albuterol (PROVENTIL HFA;VENTOLIN HFA) 108 (90 BASE) MCG/ACT inhaler Inhale 2 puffs into the lungs every 6 (six) hours as needed for wheezing or shortness of breath.  Marland Kitchen alendronate (FOSAMAX) 70 MG tablet Take 70 mg by mouth once a week. Take with a full glass of water on an empty stomach. On Monday.  Marland Kitchen alum & mag hydroxide-simeth (MAALOX/MYLANTA) 200-200-20 MG/5ML suspension Take 30 mLs by mouth every 4 (four) hours as needed for indigestion or heartburn.   Marland Kitchen amLODipine (NORVASC) 2.5 MG tablet Take 2.5 mg by mouth daily. Hold if SBP <110  . calcium carbonate (OSCAL) 1500 (600 Ca) MG TABS tablet Take 600 mg of elemental calcium by mouth 2 (two) times daily with a meal.  . camphor-menthol (SARNA) lotion Apply 1 application  topically as needed. To arms in am and legs at bed apply to bilateral lower extremities every night at bedtime for itching   . Can also use as needed.  . cetaphil (CETAPHIL) cream Apply 1 application topically daily.  . clobetasol cream (TEMOVATE) 3.71 % Apply 1 application topically 2 (two) times daily as needed.  . donepezil (ARICEPT) 10 MG tablet Take 10 mg by mouth at bedtime.  . famotidine (PEPCID) 10 MG tablet Take 10 mg by mouth at bedtime.  . furosemide (LASIX) 20 MG tablet Take 40 mg by mouth daily.   Marland Kitchen losartan (COZAAR) 100 MG tablet Take 100 mg by mouth daily.  . memantine (NAMENDA) 5 MG tablet Take 5 mg by mouth daily.  . Menthol, Topical Analgesic, (BIOFREEZE) 4 % GEL Apply topically 4 (four) times daily.  . Mirabegron ER (MYRBETRIQ) 25 MG TB24 Take 25  mg by mouth daily with breakfast. Prescribed by urology  . nitroGLYCERIN (NITROSTAT) 0.4 MG SL tablet Place 0.4 mg under the tongue every 5 (five) minutes as needed for chest pain. After taking three times and no relief call MD.  . polyethylene glycol (MIRALAX / GLYCOLAX) packet Take 17 g by mouth daily as needed for moderate constipation.   . potassium chloride SA (K-DUR) 20 MEQ tablet Take 20 mEq by mouth daily.  Marland Kitchen pyridOXINE (VITAMIN B-6) 100 MG tablet Take 100 mg by mouth daily with breakfast.    No facility-administered encounter medications on file as of 10/11/2018.     Review of Systems  Constitutional: Negative.   HENT: Negative.   Respiratory: Negative.   Cardiovascular: Positive for leg swelling.  Gastrointestinal: Negative.   Genitourinary: Negative.   Musculoskeletal: Negative.   Skin: Positive for wound.  Neurological: Positive for weakness.  Psychiatric/Behavioral: Positive for dysphoric mood. The patient is nervous/anxious.     Immunization History  Administered Date(s) Administered  . HiB (PRP-OMP) 02/12/2004  . Influenza Split 01/11/2012  . Influenza Whole 01/07/2009, 01/15/2018  . Influenza-Unspecified  01/11/2014, 01/01/2015, 01/29/2016, 02/01/2017  . PPD Test 11/14/2014, 06/03/2015, 06/17/2015  . Pneumococcal Conjugate-13 02/08/2017  . Pneumococcal Polysaccharide-23 04/28/2010  . Tdap 08/24/2011  . Zoster 04/13/2010   Pertinent  Health Maintenance Due  Topic Date Due  . INFLUENZA VACCINE  11/12/2018  . DEXA SCAN  Completed  . PNA vac Low Risk Adult  Completed   Fall Risk  01/04/2018 12/29/2016 11/02/2016 06/20/2015 05/23/2015  Falls in the past year? No No Yes No No  Comment - - [facility] Telephone Survey: data to providers prior to load - -  Number falls in past yr: - - 2 or more - -  Comment - - [facility] Telephone Survey Actual Response = 90 - -  Injury with Fall? - - Yes - -   Functional Status Survey:    Vitals:   10/11/18 1228  BP: 130/70  Pulse: 65  Resp: 19  Temp: 98 F (36.7 C)  SpO2: 95%  Weight: 157 lb 11.2 oz (71.5 kg)  Height: 5\' 5"  (1.651 m)   Body mass index is 26.24 kg/m. Physical Exam Vitals signs reviewed.  Constitutional:      Appearance: Normal appearance.  HENT:     Head: Normocephalic.     Nose: Nose normal.     Mouth/Throat:     Mouth: Mucous membranes are moist.     Pharynx: Oropharynx is clear.  Eyes:     Pupils: Pupils are equal, round, and reactive to light.  Neck:     Musculoskeletal: Neck supple.  Cardiovascular:     Rate and Rhythm: Normal rate and regular rhythm.     Pulses: Normal pulses.  Pulmonary:     Effort: Pulmonary effort is normal. No respiratory distress.     Breath sounds: Normal breath sounds. No wheezing or rales.  Abdominal:     General: Abdomen is flat. Bowel sounds are normal.     Palpations: Abdomen is soft.  Musculoskeletal:     Comments: Has moderate Swelling Bilateral  Skin:    Comments: Has Venous Ulcer in Right LE with Discharge and Redness. No Signs of Abscess.  Neurological:     General: No focal deficit present.     Mental Status: She is alert.  Psychiatric:        Mood and Affect: Mood normal.         Thought Content: Thought content normal.  Labs reviewed: Recent Labs    11/23/17 05/19/18  NA 141 141  K 3.9 3.7  CL 108  --   CO2 27  --   BUN 16 13  CREATININE 0.8 0.7  CALCIUM 9.4  --   MG 1.9  --    Recent Labs    11/23/17 05/19/18  AST 17 18  ALT 15 16  ALKPHOS 50  --   PROT 5.4  --   ALBUMIN 3.2  --    Recent Labs    11/23/17 05/19/18  WBC 7.3  --   HGB 12.7 13.0  HCT 37 38  PLT 347 351   Lab Results  Component Value Date   TSH 1.90 06/24/2017   Lab Results  Component Value Date   HGBA1C 4.4 11/14/2015   Lab Results  Component Value Date   CHOL 176 05/15/2014   HDL 62 05/15/2014   LDLCALC 95 05/15/2014   LDLDIRECT 114.3 12/08/2007   TRIG 93 05/15/2014   CHOLHDL 2.9 CALC 12/08/2007    Significant Diagnostic Results in last 30 days:  No results found.  Assessment/Plan Peripheral Venous Ulcer  Will start her on Doxycyline for 7 days Continue Kerlix and Mepilex Will start her on Metolazone with her Lasix to reduce the Edema which will help her Swelling 2.5 mg QD for 3 doses and then twice a a week. Increase her Potassium to 40 meq Recheck BMP in 1 week  Other issues are stable  Moderate dementia without behavioral disturbance Continue Aricept on Namenda  Age-related osteoporosis On Fosamax  Chronic midline low back pain Controlled on Tyelnol  urinary incontinence Continue Mybetriq    Family/ staff Communication:   Labs/tests ordered: BMP  Total time spent in this patient care encounter was  25_  minutes; greater than 50% of the visit spent counseling patient and staff, reviewing records , Labs and coordinating care for problems addressed at this encounter.

## 2018-10-17 ENCOUNTER — Non-Acute Institutional Stay (SKILLED_NURSING_FACILITY): Payer: Medicare Other | Admitting: Internal Medicine

## 2018-10-17 ENCOUNTER — Encounter: Payer: Self-pay | Admitting: Internal Medicine

## 2018-10-17 DIAGNOSIS — R600 Localized edema: Secondary | ICD-10-CM | POA: Diagnosis not present

## 2018-10-17 DIAGNOSIS — L97919 Non-pressure chronic ulcer of unspecified part of right lower leg with unspecified severity: Secondary | ICD-10-CM

## 2018-10-17 DIAGNOSIS — I10 Essential (primary) hypertension: Secondary | ICD-10-CM | POA: Diagnosis not present

## 2018-10-17 DIAGNOSIS — I83019 Varicose veins of right lower extremity with ulcer of unspecified site: Secondary | ICD-10-CM

## 2018-10-17 NOTE — Progress Notes (Signed)
Location:  [facility] Room Number: 3 Place of Service:  SNF (31) Provider:Dorlis Judice L,MD   [name], [name], NP  Patient Care Team: [name], [name], NP as PCP - General (Internal Medicine) [name], [name], NP as Nurse Practitioner (Nurse Practitioner) Virgie Dad, MD as Consulting Physician (Internal Medicine)  Extended Emergency Contact Information Primary Emergency Contact: [place] Address: [place], [place] [address] [name] of [place] Phone: [phone] Work Phone: [phone] Mobile Phone: [phone] Relation: Daughter Secondary Emergency Contact: [name] Address: [address] [place] [address] [name] of [place] Phone: [phone] Mobile Phone: [phone] Relation: Son  Code Status: DNR  Goals of care: Advanced Directive information Advanced Directives 10/17/2018  Does Patient Have a Medical Advance Directive? Yes  Type of Advance Directive Living will;Healthcare Power of [place];Out of facility DNR (pink MOST or yellow form)  Does patient want to make changes to medical advance directive? No - Patient declined  Copy of [place] in Chart? Yes - validated most recent copy scanned in chart (See row information)  Would patient like information on creating a medical advance directive? No - Patient declined  Pre-existing out of facility DNR order (yellow form or pink MOST form) Yellow form placed in chart (order not valid for inpatient use)     Chief Complaint  Patient presents with  . Acute Visit    wound/follow up     HPI:  Pt is a 85 y.o. female seen today for an acute visit for Follow up of her wound Patient has h/o Hypertension, Bilateral LE edema, Lower Back Pain, Osteoporosis, urinary Incontinence and Dementia  Patient has developed a Venous Ulcer in her Right Lower leg. It was draining last visit. I had started her on Doxycyline  and Metolazone for edema Nurses wanted me to follow her today. Her wound looks much better. No drainage. Swelling is down no redness. Base is clear Her weight is also down few pounds    Past Medical History:  Diagnosis Date  . Abnormality of gait 04/28/2010   History of fall onto right shoulder-has participated in PT. Uses walker.     . Acute encephalopathy 10/25/14   Occurred when septic  . Acute lower GI bleeding 10/30/2014  . Adjustment disorder with mixed anxiety and depressed mood 06/04/2015   11/15/15 Hgb 12.8, Na 139, K 4.2, Bun 14, creat 0.68, TP 5.7, albumin 3.5, TSH 1.54, Hgb a1c 4.4   . Anemia   . Aortic atherosclerosis ([place]) 10/27/2014  . Asthma   . Cerebral atrophy ([place]) 10/27/2014  . Cerebrovascular disease 10/27/2014   Small vessel disease  . Cervical disc disease   . Chronic lower back pain 06/04/2015   lumbar spondylosis. Regular f/u Dr. French Ana. Receives injections last 01/2013.  05/27/15 Xray left hip/pelvis: spondylotic changes, severe osteoarthritis Continue Tylenol and Tramadol prn for pain, observe the patient, adding Cymbalta may help. Mid to lower back pain more on the right side back, may consider X-ray of thoracic spine. 06/01/15 CT thoracic spine showed no acute changes.  Patient received 10 injection in the back through Dr. Alvester Morin office. She says the previous problems of discomfort in the back and down the leg have completely resolved. Takes Aleve 220mg  bid   . Debility 06/20/2015   11/15/15 Hgb 12.8, Na 139, K 4.2, Bun 14, creat 0.68,  TP 5.7, albumin 3.5, TSH 1.54, Hgb a1c 4.4   . Degenerative disc disease, lumbar 10/27/2014  . Depression   . Dermatitis 03/25/2016  . Diarrhea 10/25/14  . Diverticulosis 10/27/2014  . Essential hypertension 09/20/2006   11/15/15 Hgb 12.8, Na 139, K 4.2, Bun 14, creat 0.68, TP 5.7, albumin 3.5, TSH 1.54, Hgb a1c 4.4   . Fall at nursing home 10/25/2014  . GERD (gastroesophageal reflux disease)   . History of cardiovascular disorder  09/20/2006   X 3 in 2000 now on aspirin.    Marland Kitchen Hyperlipidemia 09/20/2006   05/15/14 LDL 95   . Hypertension   . Lumbosacral spondylosis 10/28/2009  . OSTEOPOROSIS 09/20/2006   Noted 2008    . Overactive bladder 11/29/2013   Myrbetriq prescribed by urology.    . Protein calorie malnutrition ([place])    06/06/15 TP 6.0 albumin 2.9  . SCOLIOSIS 10/28/2009  . Scoliosis 10/27/2014  . Thrombocytosis ([place]) 05/31/2015   06/10/15 plt 602 07/23/15 plt 335 11/15/15 Hgb 12.8, Na 139, K 4.2, Bun 14, creat 0.68, TP 5.7, albumin 3.5, TSH 1.54, Hgb a1c 4.4  . TIA (transient ischemic attack) 09/20/2006   Past Surgical History:  Procedure Laterality Date  . ABDOMINAL HYSTERECTOMY    . APPENDECTOMY    . CHOLECYSTECTOMY    . TONSILLECTOMY      Allergies  Allergen Reactions  . Tramadol Other (See Comments)    Hallucinations, agitation/combativeness  . Amoxicillin     Per MAR  . Oxycodone Other (See Comments)    Per MAR   . Sulfamethoxazole     Per MAR   . Penicillins Rash    Outpatient Encounter Medications as of 10/17/2018  Medication Sig  . acetaminophen (TYLENOL) 500 MG tablet Take 500 mg by mouth 3 (three) times daily.  Marland Kitchen acetaminophen (TYLENOL) 500 MG tablet Take 500 mg by mouth every 8 (eight) hours as needed.  Marland Kitchen albuterol (PROVENTIL HFA;VENTOLIN HFA) 108 (90 BASE) MCG/ACT inhaler Inhale 2 puffs into the lungs every 6 (six) hours as needed for wheezing or shortness of breath.  Marland Kitchen alendronate (FOSAMAX) 70 MG tablet Take 70 mg by mouth once a week. Take with a full glass of water on an empty stomach. On Monday.  Marland Kitchen alum & mag hydroxide-simeth (MAALOX/MYLANTA) 200-200-20 MG/5ML suspension Take 30 mLs by mouth every 4 (four) hours as needed for indigestion or heartburn.   Marland Kitchen amLODipine (NORVASC) 2.5 MG tablet Take 2.5 mg by mouth daily. Hold if SBP <110  . calcium carbonate (OSCAL) 1500 (600 Ca) MG TABS tablet Take 600 mg of elemental calcium by mouth 2 (two) times daily with a meal.  . camphor-menthol (SARNA)  lotion Apply 1 application topically as needed. To arms in am and legs at bed apply to bilateral lower extremities every night at bedtime for itching   . Can also use as needed.  . cetaphil (CETAPHIL) cream Apply 1 application topically daily.  . clobetasol cream (TEMOVATE) 6.21 % Apply 1 application topically 2 (two) times daily as needed.  . donepezil (ARICEPT) 10 MG tablet Take 10 mg by mouth at bedtime.  Marland Kitchen DOXYCYCLINE HYCLATE PO Take 100 mg by mouth 2 (two) times a day.  . famotidine (PEPCID) 10 MG tablet Take 10 mg by mouth at bedtime.  . furosemide (LASIX) 20 MG tablet Take 40 mg by mouth daily.   Marland Kitchen losartan (COZAAR) 100 MG tablet Take 100 mg by mouth daily.  . memantine (NAMENDA) 5 MG tablet Take 5 mg by  mouth daily.  . Menthol, Topical Analgesic, (BIOFREEZE) 4 % GEL Apply topically 4 (four) times daily.  . Mirabegron ER (MYRBETRIQ) 25 MG TB24 Take 25 mg by mouth daily with breakfast. Prescribed by urology  . nitroGLYCERIN (NITROSTAT) 0.4 MG SL tablet Place 0.4 mg under the tongue every 5 (five) minutes as needed for chest pain. After taking three times and no relief call MD.  . polyethylene glycol (MIRALAX / GLYCOLAX) packet Take 17 g by mouth daily as needed for moderate constipation.   . potassium chloride SA (K-DUR) 20 MEQ tablet Take 40 mEq by mouth. 2 times a week on Mondays and fridays  . pyridOXINE (VITAMIN B-6) 100 MG tablet Take 100 mg by mouth daily with breakfast.   . saccharomyces boulardii (FLORASTOR) 250 MG capsule Take 250 mg by mouth 2 (two) times daily.   No facility-administered encounter medications on file as of 10/17/2018.     Review of Systems  Cardiovascular: Positive for leg swelling.  Skin: Positive for wound.  Neurological: Positive for weakness.  Psychiatric/Behavioral: Positive for confusion and dysphoric mood.  All other systems reviewed and are negative.   Immunization History  Administered Date(s) Administered  . HiB (PRP-OMP) 02/12/2004  .  Influenza Split 01/11/2012  . Influenza Whole 01/07/2009, 01/15/2018  . Influenza-Unspecified 01/11/2014, 01/01/2015, 01/29/2016, 02/01/2017  . PPD Test 11/14/2014, 06/03/2015, 06/17/2015  . Pneumococcal Conjugate-13 02/08/2017  . Pneumococcal Polysaccharide-23 04/28/2010  . Tdap 08/24/2011  . Zoster 04/13/2010   Pertinent  Health Maintenance Due  Topic Date Due  . INFLUENZA VACCINE  11/12/2018  . DEXA SCAN  Completed  . PNA vac Low Risk Adult  Completed   Fall Risk  01/04/2018 12/29/2016 11/02/2016 06/20/2015 05/23/2015  Falls in the past year? No No Yes No No  Comment - - [facility] Telephone Survey: data to providers prior to load - -  Number falls in past yr: - - 2 or more - -  Comment - - [facility] Telephone Survey Actual Response = 90 - -  Injury with Fall? - - Yes - -   Functional Status Survey:    Vitals:   10/17/18 1053  BP: 130/74  Pulse: 74  Resp: 18  Temp: 97.8 F (36.6 C)  SpO2: 92%  Weight: 156 lb 14.4 oz (71.2 kg)  Height: 5\' 5"  (1.651 m)   Body mass index is 26.11 kg/m. Physical Exam Vitals signs reviewed.  HENT:     Head: Normocephalic.     Nose: Nose normal.     Mouth/Throat:     Mouth: Mucous membranes are moist.     Pharynx: Oropharynx is clear.  Eyes:     Pupils: Pupils are equal, round, and reactive to light.  Neck:     Musculoskeletal: Neck supple.  Cardiovascular:     Rate and Rhythm: Normal rate and regular rhythm.     Pulses: Normal pulses.  Pulmonary:     Effort: Pulmonary effort is normal.     Breath sounds: Normal breath sounds.  Abdominal:     General: Abdomen is flat. Bowel sounds are normal.     Palpations: Abdomen is soft.  Musculoskeletal:     Comments: Moderate edema Bilateral Right more then Left  Skin:    General: Skin is warm.     Comments: Venous Ulcer in Right LE No redness. Clear base. No discharge  Neurological:     General: No focal deficit present.     Mental Status: She is alert.  Psychiatric:  Mood and Affect: Mood  normal.        Thought Content: Thought content normal.     Labs reviewed: Recent Labs    11/23/17 05/19/18  NA 141 141  K 3.9 3.7  CL 108  --   CO2 27  --   BUN 16 13  CREATININE 0.8 0.7  CALCIUM 9.4  --   MG 1.9  --    Recent Labs    11/23/17 05/19/18  AST 17 18  ALT 15 16  ALKPHOS 50  --   PROT 5.4  --   ALBUMIN 3.2  --    Recent Labs    11/23/17 05/19/18  WBC 7.3  --   HGB 12.7 13.0  HCT 37 38  PLT 347 351   Lab Results  Component Value Date   TSH 1.90 06/24/2017   Lab Results  Component Value Date   HGBA1C 4.4 11/14/2015   Lab Results  Component Value Date   CHOL 176 05/15/2014   HDL 62 05/15/2014   LDLCALC 95 05/15/2014   LDLDIRECT 114.3 12/08/2007   TRIG 93 05/15/2014   CHOLHDL 2.9 CALC 12/08/2007    Significant Diagnostic Results in last 30 days:  No results found.  Assessment/Plan Peripheral Venous Ulcer  Finished her Doxycyline Continue on Metolazone Will  2.5 mg QD 3/Week Continue Kerlix and Mepilex Increase her Potassium to 40 meq Repeat Bmp Pending   Other issues are stable  Moderate dementia without behavioral disturbance Continue Aricept on Namenda  Age-related osteoporosis On Fosamax  Chronic midline low back pain Controlled on Tyelnol  urinary incontinence Continue Mybetriq    Family/ staff Communication:   Labs/tests ordered:  Total time spent in this patient care encounter was  25_  minutes; greater than 50% of the visit spent counseling patient and staff, reviewing records , Labs and coordinating care for problems addressed at this encounter.

## 2018-10-18 DIAGNOSIS — E876 Hypokalemia: Secondary | ICD-10-CM | POA: Diagnosis not present

## 2018-10-19 ENCOUNTER — Encounter: Payer: Self-pay | Admitting: Nurse Practitioner

## 2018-10-19 DIAGNOSIS — N183 Chronic kidney disease, stage 3 unspecified: Secondary | ICD-10-CM | POA: Insufficient documentation

## 2018-10-19 LAB — BASIC METABOLIC PANEL
BUN: 35 — AB (ref 4–21)
Creatinine: 1 (ref 0.5–1.1)
Glucose: 114
Potassium: 3.8 (ref 3.4–5.3)
Sodium: 140 (ref 137–147)

## 2018-10-26 ENCOUNTER — Non-Acute Institutional Stay (SKILLED_NURSING_FACILITY): Payer: Medicare Other | Admitting: Nurse Practitioner

## 2018-10-26 ENCOUNTER — Encounter: Payer: Self-pay | Admitting: Nurse Practitioner

## 2018-10-26 DIAGNOSIS — N183 Chronic kidney disease, stage 3 unspecified: Secondary | ICD-10-CM

## 2018-10-26 DIAGNOSIS — F0390 Unspecified dementia without behavioral disturbance: Secondary | ICD-10-CM | POA: Diagnosis not present

## 2018-10-26 DIAGNOSIS — K219 Gastro-esophageal reflux disease without esophagitis: Secondary | ICD-10-CM | POA: Diagnosis not present

## 2018-10-26 DIAGNOSIS — M159 Polyosteoarthritis, unspecified: Secondary | ICD-10-CM

## 2018-10-26 DIAGNOSIS — I739 Peripheral vascular disease, unspecified: Secondary | ICD-10-CM | POA: Diagnosis not present

## 2018-10-26 DIAGNOSIS — N3281 Overactive bladder: Secondary | ICD-10-CM

## 2018-10-26 DIAGNOSIS — M150 Primary generalized (osteo)arthritis: Secondary | ICD-10-CM | POA: Diagnosis not present

## 2018-10-26 DIAGNOSIS — I10 Essential (primary) hypertension: Secondary | ICD-10-CM

## 2018-10-26 DIAGNOSIS — F03B0 Unspecified dementia, moderate, without behavioral disturbance, psychotic disturbance, mood disturbance, and anxiety: Secondary | ICD-10-CM

## 2018-10-26 NOTE — Assessment & Plan Note (Signed)
No urinary retention, continue Myrbetriq 25mg qd.  

## 2018-10-26 NOTE — Assessment & Plan Note (Signed)
Mostly lower back pain, positional, continue Tylenol 500mg  tid, w/c for mobility.

## 2018-10-26 NOTE — Assessment & Plan Note (Signed)
Continue [facility] for safety and care assistance, w/c for mobility, continue Memantine 5mg  qd, Donepezil 10mg  qd for memory.

## 2018-10-26 NOTE — Assessment & Plan Note (Signed)
Blood pressure is controlled, continue Losartan 100mg qd.  

## 2018-10-26 NOTE — Progress Notes (Signed)
Location:   [facility] Room Number: 3 Place of Service:  SNF (31) Provider: Lennie Odor Kienan Doublin NP  Nelwyn Hebdon X, NP  Patient Care Team: Kyrstal Monterrosa X, NP as PCP - General (Internal Medicine) Keven Soucy X, NP as Nurse Practitioner (Nurse Practitioner) Virgie Dad, MD as Consulting Physician (Internal Medicine)  Extended Emergency Contact Information Primary Emergency Contact: [place] Address: [place], [place] [address] [name] of [place] Phone: [phone] Work Phone: [phone] Mobile Phone: [phone] Relation: Daughter Secondary Emergency Contact: [name] Address: [address] [place] [address] [name] of [place] Phone: [phone] Mobile Phone: [phone] Relation: Son  Code Status:  DNR Goals of care: Advanced Directive information Advanced Directives 10/26/2018  Does Patient Have a Medical Advance Directive? Yes  Type of Advance Directive Living will;Out of facility DNR (pink MOST or yellow form);Healthcare Power of Attorney  Does patient want to make changes to medical advance directive? No - Patient declined  Copy of [place] in Chart? Yes - validated most recent copy scanned in chart (See row information)  Would patient like information on creating a medical advance directive? No - Patient declined  Pre-existing out of facility DNR order (yellow form or pink MOST form) Yellow form placed in chart (order not valid for inpatient use)     Chief Complaint  Patient presents with  . Medical Management of Chronic Issues    routine visit     HPI:  Pt is a 85 y.o. female seen today for medical management of chronic diseases.    The patient has history of chronic edema BLE, compression wrap daily, on Furosemide '40mg'$  qd. GERD, stable on Famotidine '10mg'$  qd, Metolazone 2.'5mg'$  qd.  HTN, blood pressure is controlled on Losartan '100mg'$  qd. OBA, no urinary retention,  on Myrbetriq '25mg'$  qd. She resides in [facility] for safety and care assistance, on Memantine '5mg'$  qd, Donepezil '10mg'$  qd for memory. Lower back pain, stable on Tylenol '500mg'$  tid.    Past Medical History:  Diagnosis Date  . Abnormality of gait 04/28/2010   History of fall onto right shoulder-has participated in PT. Uses walker.     . Acute encephalopathy 10/25/14   Occurred when septic  . Acute lower GI bleeding 10/30/2014  . Adjustment disorder with mixed anxiety and depressed mood 06/04/2015   11/15/15 Hgb 12.8, Na 139, K 4.2, Bun 14, creat 0.68, TP 5.7, albumin 3.5, TSH 1.54, Hgb a1c 4.4   . Anemia   . Aortic atherosclerosis ([place]) 10/27/2014  . Asthma   . Cerebral atrophy ([place]) 10/27/2014  . Cerebrovascular disease 10/27/2014   Small vessel disease  . Cervical disc disease   . Chronic lower back pain 06/04/2015   lumbar spondylosis. Regular f/u Dr. French Ana. Receives injections last 01/2013.  05/27/15 Xray left hip/pelvis: spondylotic changes, severe osteoarthritis Continue Tylenol and Tramadol prn for pain, observe the patient, adding Cymbalta may help. Mid to lower back pain more on the right side back, may consider X-ray of thoracic spine. 06/01/15 CT thoracic spine showed no acute changes.  Patient received 10 injection in the back through Dr. Alvester Morin office. She says the previous problems of discomfort in the back and down the leg have completely resolved. Takes Aleve '220mg'$  bid   . Debility 06/20/2015   11/15/15 Hgb 12.8, Na 139, K 4.2, Bun 14, creat 0.68,  TP 5.7, albumin 3.5, TSH 1.54, Hgb a1c 4.4   . Degenerative disc disease, lumbar 10/27/2014  . Depression   . Dermatitis 03/25/2016  . Diarrhea 10/25/14  . Diverticulosis 10/27/2014  . Essential hypertension 09/20/2006   11/15/15 Hgb 12.8, Na 139, K 4.2, Bun 14, creat 0.68, TP 5.7, albumin 3.5, TSH 1.54, Hgb a1c 4.4   . Fall at nursing home 10/25/2014  . GERD (gastroesophageal reflux disease)   . History of cardiovascular disorder 09/20/2006   X 3  in 2000 now on aspirin.    Marland Kitchen Hyperlipidemia 09/20/2006   05/15/14 LDL 95   . Hypertension   . Lumbosacral spondylosis 10/28/2009  . OSTEOPOROSIS 09/20/2006   Noted 2008    . Overactive bladder 11/29/2013   Myrbetriq prescribed by urology.    . Protein calorie malnutrition ([place])    06/06/15 TP 6.0 albumin 2.9  . SCOLIOSIS 10/28/2009  . Scoliosis 10/27/2014  . Thrombocytosis ([place]) 05/31/2015   06/10/15 plt 602 07/23/15 plt 335 11/15/15 Hgb 12.8, Na 139, K 4.2, Bun 14, creat 0.68, TP 5.7, albumin 3.5, TSH 1.54, Hgb a1c 4.4  . TIA (transient ischemic attack) 09/20/2006   Past Surgical History:  Procedure Laterality Date  . ABDOMINAL HYSTERECTOMY    . APPENDECTOMY    . CHOLECYSTECTOMY    . TONSILLECTOMY      Allergies  Allergen Reactions  . Tramadol Other (See Comments)    Hallucinations, agitation/combativeness  . Amoxicillin     Per MAR  . Oxycodone Other (See Comments)    Per MAR   . Sulfamethoxazole     Per MAR   . Penicillins Rash    Allergies as of 10/26/2018      Reactions   Tramadol Other (See Comments)   Hallucinations, agitation/combativeness   Amoxicillin    Per MAR   Oxycodone Other (See Comments)   Per MAR    Sulfamethoxazole    Per MAR    Penicillins Rash      Medication List       Accurate as of October 26, 2018  4:20 PM. If you have any questions, ask your nurse or doctor.        STOP taking these medications   DOXYCYCLINE HYCLATE PO Stopped by: Marciano Mundt X Denys Salinger, NP   saccharomyces boulardii 250 MG capsule Commonly known as: FLORASTOR Stopped by: Kai Calico X Arvie Bartholomew, NP     TAKE these medications   acetaminophen 500 MG tablet Commonly known as: TYLENOL Take 500 mg by mouth 3 (three) times daily.   acetaminophen 500 MG tablet Commonly known as: TYLENOL Take 500 mg by mouth every 8 (eight) hours as needed.   albuterol 108 (90 Base) MCG/ACT inhaler Commonly known as: VENTOLIN HFA Inhale 2 puffs into the lungs every 6 (six) hours as needed for wheezing or shortness of  breath.   alendronate 70 MG tablet Commonly known as: FOSAMAX Take 70 mg by mouth once a week. Take with a full glass of water on an empty stomach. On Monday.   alum & mag hydroxide-simeth 200-200-20 MG/5ML suspension Commonly known as: MAALOX/MYLANTA Take 30 mLs by mouth every 4 (four) hours as needed for indigestion or heartburn.   amLODipine 2.5 MG tablet Commonly known as: NORVASC Take 2.5 mg by mouth daily. Hold if SBP <110   Biofreeze 4 % Gel Generic drug: Menthol (Topical Analgesic) Apply topically 4 (four) times daily.   calcium carbonate 1500 (600 Ca) MG Tabs tablet Commonly known as: OSCAL Take 600 mg of  elemental calcium by mouth 2 (two) times daily with a meal.   camphor-menthol lotion Commonly known as: SARNA Apply 1 application topically as needed. To arms in am and legs at bed apply to bilateral lower extremities every night at bedtime for itching   . Can also use as needed.   cetaphil cream Apply 1 application topically daily.   clobetasol cream 0.05 % Commonly known as: TEMOVATE Apply 1 application topically 2 (two) times daily as needed.   donepezil 10 MG tablet Commonly known as: ARICEPT Take 10 mg by mouth at bedtime.   famotidine 10 MG tablet Commonly known as: PEPCID Take 10 mg by mouth at bedtime.   furosemide 20 MG tablet Commonly known as: LASIX Take 40 mg by mouth daily.   losartan 100 MG tablet Commonly known as: COZAAR Take 100 mg by mouth daily.   memantine 5 MG tablet Commonly known as: NAMENDA Take 5 mg by mouth daily.   metolazone 2.5 MG tablet Commonly known as: ZAROXOLYN Take 2.5 mg by mouth daily. Take on Mon,Wed,Fri   Myrbetriq 25 MG Tb24 tablet Generic drug: mirabegron ER Take 25 mg by mouth daily with breakfast. Prescribed by urology   nitroGLYCERIN 0.4 MG SL tablet Commonly known as: NITROSTAT Place 0.4 mg under the tongue every 5 (five) minutes as needed for chest pain. After taking three times and no relief call  MD.   polyethylene glycol 17 g packet Commonly known as: MIRALAX / GLYCOLAX Take 17 g by mouth daily as needed for moderate constipation.   potassium chloride SA 20 MEQ tablet Commonly known as: K-DUR Take 40 mEq by mouth. 2 times a week on Mondays and fridays   pyridOXINE 100 MG tablet Commonly known as: VITAMIN B-6 Take 100 mg by mouth daily with breakfast.      ROS was provided with assistance of staff Review of Systems  Constitutional: Negative for activity change, appetite change, chills, diaphoresis, fatigue, fever and unexpected weight change.  HENT: Positive for hearing loss. Negative for congestion and voice change.   Eyes: Negative for visual disturbance.  Respiratory: Negative for cough, shortness of breath and wheezing.   Cardiovascular: Positive for leg swelling. Negative for chest pain and palpitations.  Gastrointestinal: Negative for abdominal distention, abdominal pain, constipation, diarrhea, nausea and vomiting.  Genitourinary: Negative for difficulty urinating, dysuria and urgency.  Musculoskeletal: Positive for gait problem.  Skin: Positive for wound. Negative for color change and pallor.  Neurological: Negative for dizziness, speech difficulty, weakness and headaches.       Dementia  Psychiatric/Behavioral: Positive for dysphoric mood. Negative for agitation, behavioral problems, hallucinations and sleep disturbance. The patient is not nervous/anxious.     Immunization History  Administered Date(s) Administered  . HiB (PRP-OMP) 02/12/2004  . Influenza Split 01/11/2012  . Influenza Whole 01/07/2009, 01/15/2018  . Influenza-Unspecified 01/11/2014, 01/01/2015, 01/29/2016, 02/01/2017  . PPD Test 11/14/2014, 06/03/2015, 06/17/2015  . Pneumococcal Conjugate-13 02/08/2017  . Pneumococcal Polysaccharide-23 04/28/2010  . Tdap 08/24/2011  . Zoster 04/13/2010   Pertinent  Health Maintenance Due  Topic Date Due  . INFLUENZA VACCINE  11/12/2018  . DEXA SCAN   Completed  . PNA vac Low Risk Adult  Completed   Fall Risk  01/04/2018 12/29/2016 11/02/2016 06/20/2015 05/23/2015  Falls in the past year? No No Yes No No  Comment - - Emmi Telephone Survey: data to providers prior to load - -  Number falls in past yr: - - 2 or more - -  Comment - -  Emmi Telephone Survey Actual Response = 90 - -  Injury with Fall? - - Yes - -   Functional Status Survey:    Vitals:   10/26/18 0853  BP: 130/74  Pulse: 74  Resp: 18  Temp: (!) 97.5 F (36.4 C)  SpO2: 95%  Weight: 156 lb 14.4 oz (71.2 kg)  Height: '5\' 5"'$  (1.651 m)   Body mass index is 26.11 kg/m. Physical Exam Vitals signs and nursing note reviewed.  Constitutional:      General: She is not in acute distress.    Appearance: Normal appearance. She is not ill-appearing, toxic-appearing or diaphoretic.     Comments: Over weight  HENT:     Head: Normocephalic and atraumatic.     Nose: Nose normal.     Mouth/Throat:     Mouth: Mucous membranes are moist.  Eyes:     Extraocular Movements: Extraocular movements intact.     Conjunctiva/sclera: Conjunctivae normal.     Pupils: Pupils are equal, round, and reactive to light.  Neck:     Musculoskeletal: Normal range of motion and neck supple.  Cardiovascular:     Rate and Rhythm: Normal rate and regular rhythm.     Heart sounds: No murmur.  Pulmonary:     Effort: Pulmonary effort is normal.     Breath sounds: No wheezing, rhonchi or rales.  Chest:     Chest wall: No tenderness.  Abdominal:     General: Bowel sounds are normal. There is no distension.     Palpations: Abdomen is soft.     Tenderness: There is no abdominal tenderness. There is no right CVA tenderness, left CVA tenderness, guarding or rebound.  Musculoskeletal:     Right lower leg: Edema present.     Left lower leg: Edema present.     Comments: 1-2+edea BLE. W/c for mobility.   Skin:    General: Skin is warm and dry.     Comments: Reported right lower leg venous ulcer is covered  with compression wrap.   Neurological:     General: No focal deficit present.     Mental Status: She is alert. Mental status is at baseline.     Motor: No weakness.     Coordination: Coordination normal.     Gait: Gait abnormal.     Comments: Oriented to self and her room on unit.   Psychiatric:        Mood and Affect: Mood normal.        Behavior: Behavior normal.     Labs reviewed: Recent Labs    11/23/17 05/19/18 10/19/18  NA 141 141 140  K 3.9 3.7 3.8  CL 108  --   --   CO2 27  --   --   BUN 16 13 35*  CREATININE 0.8 0.7 1.0  CALCIUM 9.4  --   --   MG 1.9  --   --    Recent Labs    11/23/17 05/19/18  AST 17 18  ALT 15 16  ALKPHOS 50  --   PROT 5.4  --   ALBUMIN 3.2  --    Recent Labs    11/23/17 05/19/18  WBC 7.3  --   HGB 12.7 13.0  HCT 37 38  PLT 347 351   Lab Results  Component Value Date   TSH 1.90 06/24/2017   Lab Results  Component Value Date   HGBA1C 4.4 11/14/2015   Lab Results  Component Value Date  CHOL 176 05/15/2014   HDL 62 05/15/2014   LDLCALC 95 05/15/2014   LDLDIRECT 114.3 12/08/2007   TRIG 93 05/15/2014   CHOLHDL 2.9 CALC 12/08/2007    Significant Diagnostic Results in last 30 days:  No results found.  Assessment/Plan  PVD (peripheral vascular disease) (HCC) Chronic edema BLE. Continue Furosemide '40mg'$  qd, Metolazone 2.'5mg'$  qd, compression wrap.   GERD (gastroesophageal reflux disease) Stable, continue Famotidine '10mg'$  qd.   Essential hypertension Blood pressure is controlled, continue Losartan '100mg'$  qd.   Moderate dementia without behavioral disturbance ([place]) Continue SNF [facility] for safety and care assistance, w/c for mobility, continue Memantine '5mg'$  qd, Donepezil '10mg'$  qd for memory.   Osteoarthritis (arthritis due to wear and tear of joints) Mostly lower back pain, positional, continue Tylenol '500mg'$  tid, w/c for mobility.   Overactive bladder No urinary retention, continue Myrbetriq '25mg'$  qd.   CKD (chronic kidney  disease) stage 3, GFR 30-59 ml/min (HCC) 10/18/18 Na 140, K 3.8, Bun 35, creat 1.03, eGFR 47. Diuretics are contributory, observe.    Family/ staff Communication: plan of care reviewed with the patient and charge nurse.   Labs/tests ordered: none  Time spend 25 minutes.

## 2018-10-26 NOTE — Assessment & Plan Note (Signed)
Stable, continue Famotidine 10mg qd.  

## 2018-10-26 NOTE — Assessment & Plan Note (Signed)
10/18/18 Na 140, K 3.8, Bun 35, creat 1.03, eGFR 47. Diuretics are contributory, observe.

## 2018-10-26 NOTE — Assessment & Plan Note (Addendum)
Chronic edema BLE. Continue Furosemide 40mg  qd, Metolazone 2.5mg  qd, compression wrap.

## 2018-11-01 ENCOUNTER — Encounter: Payer: Self-pay | Admitting: Internal Medicine

## 2018-11-01 ENCOUNTER — Non-Acute Institutional Stay (SKILLED_NURSING_FACILITY): Payer: Medicare Other | Admitting: Internal Medicine

## 2018-11-01 DIAGNOSIS — I739 Peripheral vascular disease, unspecified: Secondary | ICD-10-CM

## 2018-11-01 DIAGNOSIS — L97919 Non-pressure chronic ulcer of unspecified part of right lower leg with unspecified severity: Secondary | ICD-10-CM

## 2018-11-01 DIAGNOSIS — R600 Localized edema: Secondary | ICD-10-CM | POA: Diagnosis not present

## 2018-11-01 DIAGNOSIS — I83019 Varicose veins of right lower extremity with ulcer of unspecified site: Secondary | ICD-10-CM | POA: Diagnosis not present

## 2018-11-01 NOTE — Progress Notes (Signed)
Location:  [facility] Room Number: 3/A Place of Service:  SNF (31) Provider:Khadijatou Borak L,MD   [name], [name], NP  Patient Care Team: [name], [name], NP as PCP - General (Internal Medicine) [name], [name], NP as Nurse Practitioner (Nurse Practitioner) Virgie Dad, MD as Consulting Physician (Internal Medicine)  Extended Emergency Contact Information Primary Emergency Contact: McCalla Address: [place], [place] [address] [name] of [place] Phone: [phone] Work Phone: [phone] Mobile Phone: [phone] Relation: Daughter Secondary Emergency Contact: [name] Address: [address] [place] [address] [name] of [place] Phone: [phone] Mobile Phone: [phone] Relation: Son  Code Status: DNR  Goals of care: Advanced Directive information Advanced Directives 11/01/2018  Does Patient Have a Medical Advance Directive? Yes  Type of Paramedic of [place];Living will;Out of facility DNR (pink MOST or yellow form)  Does patient want to make changes to medical advance directive? No - Patient declined  Copy of [place] in Chart? Yes - validated most recent copy scanned in chart (See row information)  Would patient like information on creating a medical advance directive? No - Patient declined  Pre-existing out of facility DNR order (yellow form or pink MOST form) Yellow form placed in chart (order not valid for inpatient use)     Chief Complaint  Patient presents with  . Acute Visit    Venous Ulcer     HPI:  Pt is a 85 y.o. female seen today for an acute visit for Follow up of Venous Ulcer in Right LE  Patient has h/o Hypertension, Bilateral LE edema, Lower Back Pain, Osteoporosis,urinary Incontinence and Dementia This was follow up for her Venous Ulcer It is doing better. She was treated with Antibiotics and Increased  Diuresis. Her swelling is down and wound is almost healed. No More drainage or redness   Past Medical History:  Diagnosis Date  . Abnormality of gait 04/28/2010   History of fall onto right shoulder-has participated in PT. Uses walker.     . Acute encephalopathy 10/25/14   Occurred when septic  . Acute lower GI bleeding 10/30/2014  . Adjustment disorder with mixed anxiety and depressed mood 06/04/2015   11/15/15 Hgb 12.8, Na 139, K 4.2, Bun 14, creat 0.68, TP 5.7, albumin 3.5, TSH 1.54, Hgb a1c 4.4   . Anemia   . Aortic atherosclerosis ([place]) 10/27/2014  . Asthma   . Cerebral atrophy ([place]) 10/27/2014  . Cerebrovascular disease 10/27/2014   Small vessel disease  . Cervical disc disease   . Chronic lower back pain 06/04/2015   lumbar spondylosis. Regular f/u Dr. French Ana. Receives injections last 01/2013.  05/27/15 Xray left hip/pelvis: spondylotic changes, severe osteoarthritis Continue Tylenol and Tramadol prn for pain, observe the patient, adding Cymbalta may help. Mid to lower back pain more on the right side back, may consider X-ray of thoracic spine. 06/01/15 CT thoracic spine showed no acute changes.  Patient received 10 injection in the back through Dr. Alvester Morin office. She says the previous problems of discomfort in the back and down the leg have completely resolved. Takes Aleve 220mg  bid   . Debility 06/20/2015   11/15/15 Hgb 12.8, Na 139, K 4.2, Bun 14, creat 0.68, TP 5.7, albumin 3.5, TSH 1.54, Hgb a1c 4.4   . Degenerative disc disease, lumbar 10/27/2014  . Depression   .  Dermatitis 03/25/2016  . Diarrhea 10/25/14  . Diverticulosis 10/27/2014  . Essential hypertension 09/20/2006   11/15/15 Hgb 12.8, Na 139, K 4.2, Bun 14, creat 0.68, TP 5.7, albumin 3.5, TSH 1.54, Hgb a1c 4.4   . Fall at nursing home 10/25/2014  . GERD (gastroesophageal reflux disease)   . History of cardiovascular disorder 09/20/2006   X 3 in 2000 now on aspirin.    Marland Kitchen Hyperlipidemia 09/20/2006   05/15/14 LDL 95   .  Hypertension   . Lumbosacral spondylosis 10/28/2009  . OSTEOPOROSIS 09/20/2006   Noted 2008    . Overactive bladder 11/29/2013   Myrbetriq prescribed by urology.    . Protein calorie malnutrition ([place])    06/06/15 TP 6.0 albumin 2.9  . SCOLIOSIS 10/28/2009  . Scoliosis 10/27/2014  . Thrombocytosis ([place]) 05/31/2015   06/10/15 plt 602 07/23/15 plt 335 11/15/15 Hgb 12.8, Na 139, K 4.2, Bun 14, creat 0.68, TP 5.7, albumin 3.5, TSH 1.54, Hgb a1c 4.4  . TIA (transient ischemic attack) 09/20/2006   Past Surgical History:  Procedure Laterality Date  . ABDOMINAL HYSTERECTOMY    . APPENDECTOMY    . CHOLECYSTECTOMY    . TONSILLECTOMY      Allergies  Allergen Reactions  . Tramadol Other (See Comments)    Hallucinations, agitation/combativeness  . Amoxicillin     Per MAR  . Oxycodone Other (See Comments)    Per MAR   . Sulfamethoxazole     Per MAR   . Penicillins Rash    Outpatient Encounter Medications as of 11/01/2018  Medication Sig  . acetaminophen (TYLENOL) 500 MG tablet Take 500 mg by mouth 3 (three) times daily.  Marland Kitchen acetaminophen (TYLENOL) 500 MG tablet Take 500 mg by mouth every 8 (eight) hours as needed.  Marland Kitchen albuterol (PROVENTIL HFA;VENTOLIN HFA) 108 (90 BASE) MCG/ACT inhaler Inhale 2 puffs into the lungs every 6 (six) hours as needed for wheezing or shortness of breath.  Marland Kitchen alendronate (FOSAMAX) 70 MG tablet Take 70 mg by mouth once a week. Take with a full glass of water on an empty stomach. On Monday.  Marland Kitchen alum & mag hydroxide-simeth (MAALOX/MYLANTA) 200-200-20 MG/5ML suspension Take 30 mLs by mouth every 4 (four) hours as needed for indigestion or heartburn.   Marland Kitchen amLODipine (NORVASC) 2.5 MG tablet Take 2.5 mg by mouth daily. Hold if SBP <110  . calcium carbonate (OSCAL) 1500 (600 Ca) MG TABS tablet Take 600 mg of elemental calcium by mouth 2 (two) times daily with a meal.  . camphor-menthol (SARNA) lotion Apply 1 application topically as needed. To arms in am and legs at bed apply to  bilateral lower extremities every night at bedtime for itching   . Can also use as needed.  . cetaphil (CETAPHIL) cream Apply 1 application topically daily.  . clobetasol cream (TEMOVATE) 5.88 % Apply 1 application topically 2 (two) times daily as needed.  . donepezil (ARICEPT) 10 MG tablet Take 10 mg by mouth at bedtime.  . famotidine (PEPCID) 10 MG tablet Take 10 mg by mouth at bedtime.  . furosemide (LASIX) 20 MG tablet Take 40 mg by mouth daily.   Marland Kitchen losartan (COZAAR) 100 MG tablet Take 100 mg by mouth daily.  . memantine (NAMENDA) 5 MG tablet Take 5 mg by mouth daily.  . Menthol, Topical Analgesic, (BIOFREEZE) 4 % GEL Apply topically 4 (four) times daily.  . metolazone (ZAROXOLYN) 2.5 MG tablet Take 2.5 mg by mouth daily. Take on Mon,Wed,Fri  . Mirabegron ER (MYRBETRIQ)  25 MG TB24 Take 25 mg by mouth daily with breakfast. Prescribed by urology  . nitroGLYCERIN (NITROSTAT) 0.4 MG SL tablet Place 0.4 mg under the tongue every 5 (five) minutes as needed for chest pain. After taking three times and no relief call MD.  . polyethylene glycol (MIRALAX / GLYCOLAX) packet Take 17 g by mouth daily as needed for moderate constipation.   . potassium chloride SA (K-DUR) 20 MEQ tablet Take 40 mEq by mouth. 2 times a week on Mondays and fridays  . pyridOXINE (VITAMIN B-6) 100 MG tablet Take 100 mg by mouth daily with breakfast.    No facility-administered encounter medications on file as of 11/01/2018.     Review of Systems  Constitutional: Negative.   HENT: Negative.   Cardiovascular: Positive for leg swelling.  Musculoskeletal: Negative.   Skin: Positive for wound.  Neurological: Positive for weakness.  Psychiatric/Behavioral: Positive for confusion and dysphoric mood.    Immunization History  Administered Date(s) Administered  . HiB (PRP-OMP) 02/12/2004  . Influenza Split 01/11/2012  . Influenza Whole 01/07/2009, 01/15/2018  . Influenza-Unspecified 01/11/2014, 01/01/2015, 01/29/2016,  02/01/2017  . PPD Test 11/14/2014, 06/03/2015, 06/17/2015  . Pneumococcal Conjugate-13 02/08/2017  . Pneumococcal Polysaccharide-23 04/28/2010  . Tdap 08/24/2011  . Zoster 04/13/2010   Pertinent  Health Maintenance Due  Topic Date Due  . INFLUENZA VACCINE  11/12/2018  . DEXA SCAN  Completed  . PNA vac Low Risk Adult  Completed   Fall Risk  01/04/2018 12/29/2016 11/02/2016 06/20/2015 05/23/2015  Falls in the past year? No No Yes No No  Comment - - Emmi Telephone Survey: data to providers prior to load - -  Number falls in past yr: - - 2 or more - -  Comment - - Emmi Telephone Survey Actual Response = 90 - -  Injury with Fall? - - Yes - -   Functional Status Survey:    Vitals:   11/01/18 1419  BP: 130/74  Pulse: 74  Resp: 18  Temp: 97.6 F (36.4 C)  SpO2: 92%  Weight: 156 lb 14.4 oz (71.2 kg)  Height: 5\' 5"  (1.651 m)   Body mass index is 26.11 kg/m. Physical Exam Vitals signs reviewed.  Constitutional:      Appearance: Normal appearance.  HENT:     Head: Normocephalic.     Nose: Nose normal.     Mouth/Throat:     Mouth: Mucous membranes are moist.     Pharynx: Oropharynx is clear.  Eyes:     Pupils: Pupils are equal, round, and reactive to light.  Neck:     Musculoskeletal: Neck supple.  Cardiovascular:     Rate and Rhythm: Normal rate and regular rhythm.     Pulses: Normal pulses.     Heart sounds: Normal heart sounds.  Pulmonary:     Effort: Pulmonary effort is normal.     Breath sounds: Normal breath sounds.  Abdominal:     General: Abdomen is flat. Bowel sounds are normal.     Palpations: Abdomen is soft.  Musculoskeletal:     Comments: Has Moderate Edema Bilateral but Venous Ulcer is Almost healed with No discharge  Skin:    General: Skin is warm.  Neurological:     General: No focal deficit present.     Mental Status: She is alert.  Psychiatric:        Mood and Affect: Mood normal.        Thought Content: Thought content normal.  Labs reviewed:  Recent Labs    11/23/17 05/19/18 10/19/18  NA 141 141 140  K 3.9 3.7 3.8  CL 108  --   --   CO2 27  --   --   BUN 16 13 35*  CREATININE 0.8 0.7 1.0  CALCIUM 9.4  --   --   MG 1.9  --   --    Recent Labs    11/23/17 05/19/18  AST 17 18  ALT 15 16  ALKPHOS 50  --   PROT 5.4  --   ALBUMIN 3.2  --    Recent Labs    11/23/17 05/19/18  WBC 7.3  --   HGB 12.7 13.0  HCT 37 38  PLT 347 351   Lab Results  Component Value Date   TSH 1.90 06/24/2017   Lab Results  Component Value Date   HGBA1C 4.4 11/14/2015   Lab Results  Component Value Date   CHOL 176 05/15/2014   HDL 62 05/15/2014   LDLCALC 95 05/15/2014   LDLDIRECT 114.3 12/08/2007   TRIG 93 05/15/2014   CHOLHDL 2.9 CALC 12/08/2007    Significant Diagnostic Results in last 30 days:  No results found.  Assessment/Plan Venous Ulcer  Almost Healed Continue Wrapping for few more days. Meriplex Dressing LE edema BUN slightly Higher But will continue Metolazone and Lasix to keep swelling down .  Other issues are stable  Moderate dementia without behavioral disturbance Continue Ariceptand Namenda  Age-related osteoporosis On Fosamax  Chronic midline low back pain Controlled on Tyelnol  urinary incontinence Continue Mybetriq Hypertension On Norvasc and Cozaar  Family/ staff Communication:   Labs/tests ordered:  Repeat BMP in 2 weeks  Total time spent in this patient care encounter was  25_  minutes; greater than 50% of the visit spent counseling staff, reviewing records , Labs and coordinating care for problems addressed at this encounter.

## 2018-11-15 DIAGNOSIS — E876 Hypokalemia: Secondary | ICD-10-CM | POA: Diagnosis not present

## 2018-11-15 LAB — BASIC METABOLIC PANEL
BUN: 43 — AB (ref 4–21)
Creatinine: 1.2 — AB (ref 0.5–1.1)
Glucose: 112
Potassium: 3.4 (ref 3.4–5.3)
Sodium: 142 (ref 137–147)

## 2018-11-16 DIAGNOSIS — Z1159 Encounter for screening for other viral diseases: Secondary | ICD-10-CM | POA: Diagnosis not present

## 2018-11-18 ENCOUNTER — Encounter: Payer: Self-pay | Admitting: Internal Medicine

## 2018-11-18 NOTE — Progress Notes (Signed)
Opened in error

## 2018-11-23 ENCOUNTER — Encounter: Payer: Self-pay | Admitting: Nurse Practitioner

## 2018-11-23 ENCOUNTER — Non-Acute Institutional Stay (SKILLED_NURSING_FACILITY): Payer: Medicare Other | Admitting: Nurse Practitioner

## 2018-11-23 DIAGNOSIS — I10 Essential (primary) hypertension: Secondary | ICD-10-CM | POA: Diagnosis not present

## 2018-11-23 DIAGNOSIS — I739 Peripheral vascular disease, unspecified: Secondary | ICD-10-CM

## 2018-11-23 DIAGNOSIS — N183 Chronic kidney disease, stage 3 unspecified: Secondary | ICD-10-CM

## 2018-11-23 DIAGNOSIS — F03B0 Unspecified dementia, moderate, without behavioral disturbance, psychotic disturbance, mood disturbance, and anxiety: Secondary | ICD-10-CM

## 2018-11-23 DIAGNOSIS — F0390 Unspecified dementia without behavioral disturbance: Secondary | ICD-10-CM

## 2018-11-23 NOTE — Assessment & Plan Note (Signed)
Improved swelling, will decrease Furosemide to 20mg  qd, continue Metolazone 2.5mg  qd in setting of low Bp measurement, update BMP one week.

## 2018-11-23 NOTE — Progress Notes (Addendum)
Location:   [facility] Room Number: 3 Place of Service:  SNF (31) Provider: Lennie Odor [name] NP  [name], [name], NP  Patient Care Team: [name], [name], NP as PCP - General (Internal Medicine) [name], [name], NP as Nurse Practitioner (Nurse Practitioner) Virgie Dad, MD as Consulting Physician (Internal Medicine)  Extended Emergency Contact Information Primary Emergency Contact: [place] Address: [place], [place] [address] [name] of [place] Phone: [phone] Work Phone: [phone] Mobile Phone: [phone] Relation: Daughter Secondary Emergency Contact: [name] Address: [address] [place] [address] [name] of [place] Phone: [phone] Mobile Phone: [phone] Relation: Son  Code Status: DNR Goals of care: Advanced Directive information Advanced Directives 11/18/2018  Does Patient Have a Medical Advance Directive? Yes  Type of Advance Directive Living will;Healthcare Power of [place];Out of facility DNR (pink MOST or yellow form)  Does patient want to make changes to medical advance directive? No - Patient declined  Copy of [place] in Chart? Yes - validated most recent copy scanned in chart (See row information)  Would patient like information on creating a medical advance directive? No - Patient declined  Pre-existing out of facility DNR order (yellow form or pink MOST form) Yellow form placed in chart (order not valid for inpatient use)     Chief Complaint  Patient presents with  . Acute Visit    low blood pressure    HPI:  Pt is a 85 y.o. female seen today for an acute visit for low blood pressure measurement, HPI was provided with assistance of staff. The patient denied dizziness, change of vision, chest pain/pressure, palpitation, or SOB. Hx of HTN, taking Metolazone 2.'5mg'$  qd, Losartan '100mg'$  qd, Furosemide '40mg'$  qd, Amlodipine 2.'5mg'$  qd. She resides in  [facility] for safety and care assistance, on Memantine '5mg'$  qd, Donepezil '10mg'$  qd for memory.    Past Medical History:  Diagnosis Date  . Abnormality of gait 04/28/2010   History of fall onto right shoulder-has participated in PT. Uses walker.     . Acute encephalopathy 10/25/14   Occurred when septic  . Acute lower GI bleeding 10/30/2014  . Adjustment disorder with mixed anxiety and depressed mood 06/04/2015   11/15/15 Hgb 12.8, Na 139, K 4.2, Bun 14, creat 0.68, TP 5.7, albumin 3.5, TSH 1.54, Hgb a1c 4.4   . Anemia   . Aortic atherosclerosis ([place]) 10/27/2014  . Asthma   . Cerebral atrophy ([place]) 10/27/2014  . Cerebrovascular disease 10/27/2014   Small vessel disease  . Cervical disc disease   . Chronic lower back pain 06/04/2015   lumbar spondylosis. Regular f/u Dr. French Ana. Receives injections last 01/2013.  05/27/15 Xray left hip/pelvis: spondylotic changes, severe osteoarthritis Continue Tylenol and Tramadol prn for pain, observe the patient, adding Cymbalta may help. Mid to lower back pain more on the right side back, may consider X-ray of thoracic spine. 06/01/15 CT thoracic spine showed no acute changes.  Patient received 10 injection in the back through Dr. Alvester Morin office. She says the previous problems of discomfort in the back and down the leg have completely resolved. Takes Aleve '220mg'$  bid   . Debility 06/20/2015   11/15/15 Hgb 12.8, Na 139, K 4.2, Bun 14, creat 0.68, TP 5.7, albumin 3.5, TSH 1.54, Hgb a1c 4.4   . Degenerative disc disease, lumbar 10/27/2014  .  Depression   . Dermatitis 03/25/2016  . Diarrhea 10/25/14  . Diverticulosis 10/27/2014  . Essential hypertension 09/20/2006   11/15/15 Hgb 12.8, Na 139, K 4.2, Bun 14, creat 0.68, TP 5.7, albumin 3.5, TSH 1.54, Hgb a1c 4.4   . Fall at nursing home 10/25/2014  . GERD (gastroesophageal reflux disease)   . History of cardiovascular disorder 09/20/2006   X 3 in 2000 now on aspirin.    Marland Kitchen Hyperlipidemia 09/20/2006   05/15/14 LDL 95   .  Hypertension   . Lumbosacral spondylosis 10/28/2009  . OSTEOPOROSIS 09/20/2006   Noted 2008    . Overactive bladder 11/29/2013   Myrbetriq prescribed by urology.    . Protein calorie malnutrition ([place])    06/06/15 TP 6.0 albumin 2.9  . SCOLIOSIS 10/28/2009  . Scoliosis 10/27/2014  . Thrombocytosis ([place]) 05/31/2015   06/10/15 plt 602 07/23/15 plt 335 11/15/15 Hgb 12.8, Na 139, K 4.2, Bun 14, creat 0.68, TP 5.7, albumin 3.5, TSH 1.54, Hgb a1c 4.4  . TIA (transient ischemic attack) 09/20/2006   Past Surgical History:  Procedure Laterality Date  . ABDOMINAL HYSTERECTOMY    . APPENDECTOMY    . CHOLECYSTECTOMY    . TONSILLECTOMY      Allergies  Allergen Reactions  . Tramadol Other (See Comments)    Hallucinations, agitation/combativeness  . Amoxicillin     Per MAR  . Oxycodone Other (See Comments)    Per MAR   . Sulfamethoxazole     Per MAR   . Penicillins Rash    Allergies as of 11/23/2018      Reactions   Tramadol Other (See Comments)   Hallucinations, agitation/combativeness   Amoxicillin    Per MAR   Oxycodone Other (See Comments)   Per MAR    Sulfamethoxazole    Per MAR    Penicillins Rash      Medication List       Accurate as of November 23, 2018 11:59 PM. If you have any questions, ask your nurse or doctor.        acetaminophen 500 MG tablet Commonly known as: TYLENOL Take 500 mg by mouth 3 (three) times daily.   acetaminophen 500 MG tablet Commonly known as: TYLENOL Take 500 mg by mouth every 8 (eight) hours as needed.   albuterol 108 (90 Base) MCG/ACT inhaler Commonly known as: VENTOLIN HFA Inhale 2 puffs into the lungs every 6 (six) hours as needed for wheezing or shortness of breath.   alendronate 70 MG tablet Commonly known as: FOSAMAX Take 70 mg by mouth once a week. Take with a full glass of water on an empty stomach. On Monday.   alum & mag hydroxide-simeth 200-200-20 MG/5ML suspension Commonly known as: MAALOX/MYLANTA Take 30 mLs by mouth every 4  (four) hours as needed for indigestion or heartburn.   amLODipine 2.5 MG tablet Commonly known as: NORVASC Take 2.5 mg by mouth daily. Hold if SBP <110   Biofreeze 4 % Gel Generic drug: Menthol (Topical Analgesic) Apply topically 4 (four) times daily.   calcium carbonate 1500 (600 Ca) MG Tabs tablet Commonly known as: OSCAL Take 600 mg of elemental calcium by mouth 2 (two) times daily with a meal.   camphor-menthol lotion Commonly known as: SARNA Apply 1 application topically as needed. To arms in am and legs at bed apply to bilateral lower extremities every night at bedtime for itching   . Can also use as needed.   cetaphil cream Apply 1 application topically daily.  clobetasol cream 0.05 % Commonly known as: TEMOVATE Apply 1 application topically 2 (two) times daily as needed.   donepezil 10 MG tablet Commonly known as: ARICEPT Take 10 mg by mouth at bedtime.   famotidine 10 MG tablet Commonly known as: PEPCID Take 10 mg by mouth at bedtime.   furosemide 20 MG tablet Commonly known as: LASIX Take 40 mg by mouth daily.   losartan 100 MG tablet Commonly known as: COZAAR Take 100 mg by mouth daily.   memantine 5 MG tablet Commonly known as: NAMENDA Take 5 mg by mouth daily.   metolazone 2.5 MG tablet Commonly known as: ZAROXOLYN Take 2.5 mg by mouth daily. Take on Mon,Wed,Fri   Myrbetriq 25 MG Tb24 tablet Generic drug: mirabegron ER Take 25 mg by mouth daily with breakfast. Prescribed by urology   nitroGLYCERIN 0.4 MG SL tablet Commonly known as: NITROSTAT Place 0.4 mg under the tongue every 5 (five) minutes as needed for chest pain. After taking three times and no relief call MD.   polyethylene glycol 17 g packet Commonly known as: MIRALAX / GLYCOLAX Take 17 g by mouth daily as needed for moderate constipation.   potassium chloride SA 20 MEQ tablet Commonly known as: K-DUR Take 40 mEq by mouth. 2 times a week on Mondays and fridays   pyridOXINE 100 MG  tablet Commonly known as: VITAMIN B-6 Take 100 mg by mouth daily with breakfast.       ROS was provided with assistance of staff Review of Systems  Constitutional: Positive for unexpected weight change. Negative for activity change, appetite change, chills, diaphoresis, fatigue and fever.       Weight loss  HENT: Positive for hearing loss. Negative for congestion and voice change.   Respiratory: Negative for cough, shortness of breath and wheezing.   Cardiovascular: Positive for leg swelling. Negative for chest pain and palpitations.  Gastrointestinal: Negative for abdominal distention, abdominal pain, constipation, diarrhea, nausea and vomiting.  Genitourinary: Negative for difficulty urinating, dysuria and urgency.  Musculoskeletal: Positive for arthralgias and gait problem.  Skin: Negative for color change and pallor.  Neurological: Negative for dizziness, speech difficulty, weakness and headaches.       Dementia  Psychiatric/Behavioral: Positive for dysphoric mood. Negative for agitation, behavioral problems, hallucinations and sleep disturbance. The patient is not nervous/anxious.     Immunization History  Administered Date(s) Administered  . HiB (PRP-OMP) 02/12/2004  . Influenza Split 01/11/2012  . Influenza Whole 01/07/2009, 01/15/2018  . Influenza-Unspecified 01/11/2014, 01/01/2015, 01/29/2016, 02/01/2017  . PPD Test 11/14/2014, 06/03/2015, 06/17/2015  . Pneumococcal Conjugate-13 02/08/2017  . Pneumococcal Polysaccharide-23 04/28/2010  . Tdap 08/24/2011  . Zoster 04/13/2010   Pertinent  Health Maintenance Due  Topic Date Due  . INFLUENZA VACCINE  11/12/2018  . DEXA SCAN  Completed  . PNA vac Low Risk Adult  Completed   Fall Risk  01/04/2018 12/29/2016 11/02/2016 06/20/2015 05/23/2015  Falls in the past year? No No Yes No No  Comment - - Emmi Telephone Survey: data to providers prior to load - -  Number falls in past yr: - - 2 or more - -  Comment - - Emmi Telephone  Survey Actual Response = 90 - -  Injury with Fall? - - Yes - -   Functional Status Survey:    Vitals:   11/23/18 1634  BP: (!) 102/50  Pulse: 71  Resp: 16  Temp: (!) 97.4 F (36.3 C)  SpO2: 95%   There is no height or  weight on file to calculate BMI. Physical Exam Vitals signs and nursing note reviewed.  Constitutional:      General: She is not in acute distress.    Appearance: Normal appearance. She is normal weight. She is not ill-appearing, toxic-appearing or diaphoretic.  HENT:     Head: Normocephalic and atraumatic.     Nose: Nose normal.     Mouth/Throat:     Mouth: Mucous membranes are moist.  Eyes:     Extraocular Movements: Extraocular movements intact.     Conjunctiva/sclera: Conjunctivae normal.     Pupils: Pupils are equal, round, and reactive to light.  Neck:     Musculoskeletal: Normal range of motion and neck supple.  Cardiovascular:     Rate and Rhythm: Normal rate and regular rhythm.  Pulmonary:     Effort: Pulmonary effort is normal.     Breath sounds: No wheezing, rhonchi or rales.  Abdominal:     General: Bowel sounds are normal. There is no distension.     Palpations: Abdomen is soft.     Tenderness: There is no abdominal tenderness. There is no right CVA tenderness, left CVA tenderness, guarding or rebound.  Musculoskeletal:     Right lower leg: Edema present.     Left lower leg: Edema present.     Comments: Trace edema BLE, w/c for mobility  Skin:    General: Skin is warm and dry.  Neurological:     General: No focal deficit present.     Mental Status: She is alert. Mental status is at baseline.     Gait: Gait abnormal.     Comments: Oriented to person  Psychiatric:        Mood and Affect: Mood normal.        Behavior: Behavior normal.     Labs reviewed: Recent Labs    05/19/18 10/19/18  NA 141 140  K 3.7 3.8  BUN 13 35*  CREATININE 0.7 1.0   Recent Labs    05/19/18  AST 18  ALT 16   Recent Labs    05/19/18  HGB 13.0   HCT 38  PLT 351   Lab Results  Component Value Date   TSH 1.90 06/24/2017   Lab Results  Component Value Date   HGBA1C 4.4 11/14/2015   Lab Results  Component Value Date   CHOL 176 05/15/2014   HDL 62 05/15/2014   LDLCALC 95 05/15/2014   LDLDIRECT 114.3 12/08/2007   TRIG 93 05/15/2014   CHOLHDL 2.9 CALC 12/08/2007    Significant Diagnostic Results in last 30 days:  No results found.  Assessment/Plan: Essential hypertension Low blood pressure measurement, dc Amlodipine 2.'5mg'$  qd, decrease Furosemide to '20mg'$  qd, decrease Losartan '50mg'$  qd,  continue Metolazone 2.'5mg'$  qd. VS q shift x 1 week. Update BMP one wk.   PVD (peripheral vascular disease) (HCC) Improved swelling, will decrease Furosemide to '20mg'$  qd, continue Metolazone 2.'5mg'$  qd in setting of low Bp measurement, update BMP one week.   CKD (chronic kidney disease) stage 3, GFR 30-59 ml/min (HCC) 10/18/18 Na 140, K 3.8, Bun 35, creat 1.03, eGFR 47.  11/15/18 Na 142, K 3.4, Bun 43, creat 1.18, eGFR 40 Furosemide, Metolazone are contributory, decrease Furosemide to '20mg'$ , update BMP one week.   Moderate dementia without behavioral disturbance ([place]) Continue SNF [facility] for safety and care assistance, continue Memantine, Donepezil for memory.     Family/ staff Communication: plan of care reviewed with the patient and charge nurse.  Labs/tests ordered:  BMP one week  Time spend 25 minutes.

## 2018-11-23 NOTE — Assessment & Plan Note (Addendum)
Low blood pressure measurement, dc Amlodipine 2.5mg  qd, decrease Furosemide to 20mg  qd, decrease Losartan 50mg  qd,  continue Metolazone 2.5mg  qd. VS q shift x 1 week. Update BMP one wk.

## 2018-11-23 NOTE — Assessment & Plan Note (Signed)
10/18/18 Na 140, K 3.8, Bun 35, creat 1.03, eGFR 47.  11/15/18 Na 142, K 3.4, Bun 43, creat 1.18, eGFR 40 Furosemide, Metolazone are contributory, decrease Furosemide to '20mg'$ , update BMP one week.

## 2018-11-23 NOTE — Assessment & Plan Note (Signed)
Continue SNF [facility] for safety and care assistance, continue Memantine, Donepezil for memory.

## 2018-11-25 ENCOUNTER — Non-Acute Institutional Stay (SKILLED_NURSING_FACILITY): Payer: Medicare Other | Admitting: Internal Medicine

## 2018-11-25 ENCOUNTER — Other Ambulatory Visit: Payer: Self-pay | Admitting: *Deleted

## 2018-11-25 ENCOUNTER — Encounter: Payer: Self-pay | Admitting: Internal Medicine

## 2018-11-25 DIAGNOSIS — R600 Localized edema: Secondary | ICD-10-CM

## 2018-11-25 DIAGNOSIS — L97919 Non-pressure chronic ulcer of unspecified part of right lower leg with unspecified severity: Secondary | ICD-10-CM

## 2018-11-25 DIAGNOSIS — F03B0 Unspecified dementia, moderate, without behavioral disturbance, psychotic disturbance, mood disturbance, and anxiety: Secondary | ICD-10-CM

## 2018-11-25 DIAGNOSIS — F0390 Unspecified dementia without behavioral disturbance: Secondary | ICD-10-CM

## 2018-11-25 DIAGNOSIS — N183 Chronic kidney disease, stage 3 unspecified: Secondary | ICD-10-CM

## 2018-11-25 DIAGNOSIS — I83019 Varicose veins of right lower extremity with ulcer of unspecified site: Secondary | ICD-10-CM

## 2018-11-25 DIAGNOSIS — I10 Essential (primary) hypertension: Secondary | ICD-10-CM

## 2018-11-25 LAB — CHLORIDE
Calcium: 11
Carbon Dioxide, Total: 29
Chloride: 104
EGFR (Non-African Amer.): 40

## 2018-11-25 NOTE — Progress Notes (Signed)
Location:  [facility] Room Number: 3 Place of Service:  SNF (31) Provider:  Veleta Miners  MD  [name], [name], NP  Patient Care Team: [name], [name], NP as PCP - General (Internal Medicine) [name], [name], NP as Nurse Practitioner (Nurse Practitioner) Virgie Dad, MD as Consulting Physician (Internal Medicine)  Extended Emergency Contact Information Primary Emergency Contact: [name] Address: [place], [place] [address] [name] of [place] Phone: [phone] Work Phone: [phone] Mobile Phone: [phone] Relation: Daughter Secondary Emergency Contact: [name] Address: [address] [place] [address] [name] of [place] Phone: [phone] Mobile Phone: [phone] Relation: Son  Code Status:  DNR Goals of care: Advanced Directive information Advanced Directives 11/25/2018  Does Patient Have a Medical Advance Directive? Yes  Type of Paramedic of [place];Living will;Out of facility DNR (pink MOST or yellow form)  Does patient want to make changes to medical advance directive? No - Patient declined  Copy of [place] in Chart? Yes - validated most recent copy scanned in chart (See row information)  Would patient like information on creating a medical advance directive? -  Pre-existing out of facility DNR order (yellow form or pink MOST form) Yellow form placed in chart (order not valid for inpatient use)     Chief Complaint  Patient presents with   Medical Management of Chronic Issues    HPI:  Pt is a 85 y.o. female seen today for medical management of chronic diseases.   Patient has h/o Hypertension, Bilateral LE edema, Lower Back Pain, Osteoporosis urinary Incontinence and Dementia Patient was recently treated for Venous Ulcer which has now resolved She had to be started on metolazone to help with her LE swelling ulcer  and drainage But now her BP is running low. So Amlodipine was discontinued and Cozaar was reduced in dose.  Patient is doing well. She has lost some weight most likely due to Diuresis BUN is also Slightly high She does not have any more Edema in her legs Ulcer is resolved and she does not need any more Wrapping. Patient does not let them do it anyway Eating well No other acute issues  Past Medical History:  Diagnosis Date   Abnormality of gait 04/28/2010   History of fall onto right shoulder-has participated in PT. Uses walker.      Acute encephalopathy 10/25/14   Occurred when septic   Acute lower GI bleeding 10/30/2014   Adjustment disorder with mixed anxiety and depressed mood 06/04/2015   11/15/15 Hgb 12.8, Na 139, K 4.2, Bun 14, creat 0.68, TP 5.7, albumin 3.5, TSH 1.54, Hgb a1c 4.4    Anemia    Aortic atherosclerosis ([facility]) 10/27/2014   Asthma    Cerebral atrophy ([facility]) 10/27/2014   Cerebrovascular disease 10/27/2014   Small vessel disease   Cervical disc disease    Chronic lower back pain 06/04/2015   lumbar spondylosis. Regular f/u Dr. French Ana. Receives injections last 01/2013.  05/27/15 Xray left hip/pelvis: spondylotic changes, severe osteoarthritis Continue Tylenol and Tramadol prn for pain, observe the patient, adding Cymbalta may help. Mid to lower back pain more on the right side back, may consider X-ray of thoracic spine. 06/01/15 CT thoracic spine showed no acute changes.  Patient received 10 injection in the back through Dr. Alvester Morin office. She says the previous  problems of discomfort in the back and down the leg have completely resolved. Takes Aleve 220mg  bid    Debility 06/20/2015   11/15/15 Hgb 12.8, Na 139, K 4.2, Bun 14, creat 0.68, TP 5.7, albumin 3.5, TSH 1.54, Hgb a1c 4.4    Degenerative disc disease, lumbar 10/27/2014   Depression    Dermatitis 03/25/2016   Diarrhea 10/25/14   Diverticulosis 10/27/2014   Essential hypertension 09/20/2006   11/15/15 Hgb  12.8, Na 139, K 4.2, Bun 14, creat 0.68, TP 5.7, albumin 3.5, TSH 1.54, Hgb a1c 4.4    Fall at nursing home 10/25/2014   GERD (gastroesophageal reflux disease)    History of cardiovascular disorder 09/20/2006   X 3 in 2000 now on aspirin.     Hyperlipidemia 09/20/2006   05/15/14 LDL 95    Hypertension    Lumbosacral spondylosis 10/28/2009   OSTEOPOROSIS 09/20/2006   Noted 2008     Overactive bladder 11/29/2013   Myrbetriq prescribed by urology.     Protein calorie malnutrition ([place])    06/06/15 TP 6.0 albumin 2.9   SCOLIOSIS 10/28/2009   Scoliosis 10/27/2014   Thrombocytosis ([facility]) 05/31/2015   06/10/15 plt 602 07/23/15 plt 335 11/15/15 Hgb 12.8, Na 139, K 4.2, Bun 14, creat 0.68, TP 5.7, albumin 3.5, TSH 1.54, Hgb a1c 4.4   TIA (transient ischemic attack) 09/20/2006   Past Surgical History:  Procedure Laterality Date   ABDOMINAL HYSTERECTOMY     APPENDECTOMY     CHOLECYSTECTOMY     TONSILLECTOMY      Allergies  Allergen Reactions   Tramadol Other (See Comments)    Hallucinations, agitation/combativeness   Amoxicillin     Per [facility]   Oxycodone Other (See Comments)    Per MAR    Sulfamethoxazole     Per MAR    Penicillins Rash    Outpatient Encounter Medications as of 11/25/2018  Medication Sig   acetaminophen (TYLENOL) 500 MG tablet Take 500 mg by mouth 3 (three) times daily.   acetaminophen (TYLENOL) 500 MG tablet Take 500 mg by mouth every 8 (eight) hours as needed.   albuterol (PROVENTIL HFA;VENTOLIN HFA) 108 (90 BASE) MCG/ACT inhaler Inhale 2 puffs into the lungs every 6 (six) hours as needed for wheezing or shortness of breath.   alendronate (FOSAMAX) 70 MG tablet Take 70 mg by mouth once a week. Take with a full glass of water on an empty stomach. On Monday.   alum & mag hydroxide-simeth (MAALOX/MYLANTA) 200-200-20 MG/5ML suspension Take 30 mLs by mouth every 4 (four) hours as needed for indigestion or heartburn.    calcium carbonate (OSCAL) 1500 (600 Ca) MG  TABS tablet Take 600 mg of elemental calcium by mouth 2 (two) times daily with a meal.   camphor-menthol (SARNA) lotion Apply 1 application topically as needed. To arms in am and legs at bed apply to bilateral lower extremities every night at bedtime for itching   . Can also use as needed.   cetaphil (CETAPHIL) cream Apply 1 application topically daily.   clobetasol cream (TEMOVATE) 4.28 % Apply 1 application topically 2 (two) times daily as needed.   donepezil (ARICEPT) 10 MG tablet Take 10 mg by mouth at bedtime.   famotidine (PEPCID) 10 MG tablet Take 10 mg by mouth at bedtime.   furosemide (LASIX) 20 MG tablet Take 40 mg by mouth daily.    losartan (COZAAR) 100 MG tablet Take 50 mg by mouth daily. Take 1/2 tablet.   memantine (NAMENDA) 5  MG tablet Take 5 mg by mouth daily.   Menthol, Topical Analgesic, (BIOFREEZE) 4 % GEL Apply topically 4 (four) times daily.   metolazone (ZAROXOLYN) 2.5 MG tablet Take 2.5 mg by mouth daily. Take on Mon,Wed,Fri   Mirabegron ER (MYRBETRIQ) 25 MG TB24 Take 25 mg by mouth daily with breakfast. Prescribed by urology   nitroGLYCERIN (NITROSTAT) 0.4 MG SL tablet Place 0.4 mg under the tongue every 5 (five) minutes as needed for chest pain. After taking three times and no relief call MD.   polyethylene glycol (MIRALAX / GLYCOLAX) packet Take 17 g by mouth daily as needed for moderate constipation.    potassium chloride SA (K-DUR) 20 MEQ tablet Take 40 mEq by mouth. 2 times a week on Mondays and fridays   [DISCONTINUED] amLODipine (NORVASC) 2.5 MG tablet Take 2.5 mg by mouth daily. Hold if SBP <110   [DISCONTINUED] pyridOXINE (VITAMIN B-6) 100 MG tablet Take 100 mg by mouth daily with breakfast.    No facility-administered encounter medications on file as of 11/25/2018.     Review of Systems  Unable to perform ROS: Dementia    Immunization History  Administered Date(s) Administered   HiB (PRP-OMP) 02/12/2004   Influenza Split 01/11/2012    Influenza Whole 01/07/2009, 01/15/2018   Influenza-Unspecified 01/11/2014, 01/01/2015, 01/29/2016, 02/01/2017   PPD Test 11/14/2014, 06/03/2015, 06/17/2015   Pneumococcal Conjugate-13 02/08/2017   Pneumococcal Polysaccharide-23 04/28/2010   Tdap 08/24/2011   Zoster 04/13/2010   Pertinent  Health Maintenance Due  Topic Date Due   INFLUENZA VACCINE  11/12/2018   DEXA SCAN  Completed   PNA vac Low Risk Adult  Completed   Fall Risk  01/04/2018 12/29/2016 11/02/2016 06/20/2015 05/23/2015  Falls in the past year? No No Yes No No  Comment - - [facility]i Telephone Survey: data to providers prior to load - -  Number falls in past yr: - - 2 or more - -  Comment - - Emmi Telephone Survey Actual Response = 90 - -  Injury with Fall? - - Yes - -   Functional Status Survey:    Vitals:   11/25/18 0857  BP: 118/64  Pulse: 68  Resp: 18  Temp: (!) 97.3 F (36.3 C)  SpO2: 96%  Weight: 152 lb 14.4 oz (69.4 kg)  Height: 5\' 5"  (1.651 m)   Body mass index is 25.44 kg/m. Physical Exam Vitals signs reviewed.  Constitutional:      Appearance: Normal appearance.  HENT:     Head: Normocephalic.     Nose: Nose normal.     Mouth/Throat:     Mouth: Mucous membranes are moist.     Pharynx: Oropharynx is clear.  Eyes:     Pupils: Pupils are equal, round, and reactive to light.  Neck:     Musculoskeletal: Neck supple.  Cardiovascular:     Rate and Rhythm: Normal rate and regular rhythm.     Pulses: Normal pulses.  Pulmonary:     Effort: Pulmonary effort is normal.  Abdominal:     General: Abdomen is flat. Bowel sounds are normal.     Palpations: Abdomen is soft.  Musculoskeletal:     Comments: Trace edema Bilateral  Skin:    General: Skin is warm and dry.  Neurological:     General: No focal deficit present.     Mental Status: She is alert.     Comments: Responds Appropriately Wheelchair dependent  Psychiatric:        Mood and Affect: Mood  normal.        Thought Content: Thought  content normal.     Labs reviewed: Recent Labs    05/19/18 10/19/18 11/15/18  NA 141 140 142  K 3.7 3.8 3.4  CL  --   --  104  CO2  --   --  29  BUN 13 35* 43*  CREATININE 0.7 1.0 1.2*  CALCIUM  --   --  11.0   Recent Labs    05/19/18  AST 18  ALT 16   Recent Labs    05/19/18  HGB 13.0  HCT 38  PLT 351   Lab Results  Component Value Date   TSH 1.90 06/24/2017   Lab Results  Component Value Date   HGBA1C 4.4 11/14/2015   Lab Results  Component Value Date   CHOL 176 05/15/2014   HDL 62 05/15/2014   LDLCALC 95 05/15/2014   LDLDIRECT 114.3 12/08/2007   TRIG 93 05/15/2014   CHOLHDL 2.9 CALC 12/08/2007    Significant Diagnostic Results in last 30 days:  No results found.  Assessment/Plan Essential hypertension - Plan:  Amlodipine discontinued Cozaar decreased in dose BP good control   Leg edema - Plan:  Resolved mostly on High Dose of lasix and Metolozone Decrease the dose of Metolazone to 2,5 mg 2/week Venous ulcer of right leg (HCC) - Plan:  Resolved  CKD (chronic kidney disease) stage 3, GFR 30-59 ml/min (HCC) - Plan: worsening BUN Repeat BMP after decreasing the dose of Zaroxylyn  Moderate dementia without behavioral disturbance ([facility]) - Plan:  On Aricept Increase the dose of Namenda to 5 mg BID  Age-related osteoporosis  On Fosamax  Chronic midline low back pain  Controlled on Tyelnol   urinary incontinence Continue Mybetriq  Family/ staff Communication:   Labs/tests ordered:  Bmp in 4 weeks  Total time spent in this patient care encounter was  25_  minutes; greater than 50% of the visit spent counseling  staff, reviewing records , Labs and coordinating care for problems addressed at this encounter.

## 2018-11-29 DIAGNOSIS — I10 Essential (primary) hypertension: Secondary | ICD-10-CM | POA: Diagnosis not present

## 2018-11-30 ENCOUNTER — Encounter: Payer: Self-pay | Admitting: Nurse Practitioner

## 2018-11-30 ENCOUNTER — Non-Acute Institutional Stay (SKILLED_NURSING_FACILITY): Payer: Medicare Other | Admitting: Nurse Practitioner

## 2018-11-30 DIAGNOSIS — F0390 Unspecified dementia without behavioral disturbance: Secondary | ICD-10-CM | POA: Diagnosis not present

## 2018-11-30 DIAGNOSIS — E876 Hypokalemia: Secondary | ICD-10-CM | POA: Diagnosis not present

## 2018-11-30 DIAGNOSIS — F03B0 Unspecified dementia, moderate, without behavioral disturbance, psychotic disturbance, mood disturbance, and anxiety: Secondary | ICD-10-CM

## 2018-11-30 DIAGNOSIS — I739 Peripheral vascular disease, unspecified: Secondary | ICD-10-CM | POA: Diagnosis not present

## 2018-11-30 NOTE — Assessment & Plan Note (Signed)
low serum potassium, 3.0 11/29/18. Hx of edema BLE, on Furosemide 20mg  qd, Metolazone 2.5 mg 2x/wk since 11/25/18, KCL 40 2x/wk. Kcl 33meq x1 11/29/18, then starts 20meq daily, f/u BMP one wk scheduled.

## 2018-11-30 NOTE — Progress Notes (Signed)
Location:  [facility] Room Number: 3 Place of Service:  SNF (31) Provider: Marlana Latus  NP  Arletta Lumadue X, NP  Patient Care Team: Sakari Alkhatib X, NP as PCP - General (Internal Medicine) Sebrina Kessner X, NP as Nurse Practitioner (Nurse Practitioner) Virgie Dad, MD as Consulting Physician (Internal Medicine)  Extended Emergency Contact Information Primary Emergency Contact: [place] Address: [place], [place] [address] [name] of [place] Phone: [phone] Work Phone: [phone] Mobile Phone: [phone] Relation: Daughter Secondary Emergency Contact: [name] Address: [address] [place] [address] [name] of [place] Phone: [phone] Mobile Phone: [phone] Relation: Son  Code Status:  DNR Goals of care: Advanced Directive information Advanced Directives 11/25/2018  Does Patient Have a Medical Advance Directive? Yes  Type of Paramedic of [place];Living will;Out of facility DNR (pink MOST or yellow form)  Does patient want to make changes to medical advance directive? No - Patient declined  Copy of [place] in Chart? Yes - validated most recent copy scanned in chart (See row information)  Would patient like information on creating a medical advance directive? -  Pre-existing out of facility DNR order (yellow form or pink MOST form) Yellow form placed in chart (order not valid for inpatient use)     Chief Complaint  Patient presents with  . Acute Visit    C/o - hypkalemia     HPI:  Pt is a 85 y.o. female seen today for an acute visit for low serum potassium, 3.0 11/29/18. Hx of edema BLE, on Furosemide 20mg  qd, Metolazone 2.5 mg 2x/wk since 11/25/18, KCL 40 2x/wk. Kcl 45meq x1 11/29/18, then starts 61meq daily, f/u BMP one wk scheduled. She denied muscle cramps, palpitation, difficulty breathing, worsened weakness,  fatigue, or change of mentation.   Hx of dementia, resides in SNF [facility] for safety and care assistance, taking Memantine 5mg  bid, Donepezil 10mg  qd for memory.   Past Medical History:  Diagnosis Date  . Abnormality of gait 04/28/2010   History of fall onto right shoulder-has participated in PT. Uses walker.     . Acute encephalopathy 10/25/14   Occurred when septic  . Acute lower GI bleeding 10/30/2014  . Adjustment disorder with mixed anxiety and depressed mood 06/04/2015   11/15/15 Hgb 12.8, Na 139, K 4.2, Bun 14, creat 0.68, TP 5.7, albumin 3.5, TSH 1.54, Hgb a1c 4.4   . Anemia   . Aortic atherosclerosis ([place]) 10/27/2014  . Asthma   . Cerebral atrophy ([facility]) 10/27/2014  . Cerebrovascular disease 10/27/2014   Small vessel disease  . Cervical disc disease   . Chronic lower back pain 06/04/2015   lumbar spondylosis. Regular f/u Dr. French Ana. Receives injections last 01/2013.  05/27/15 Xray left hip/pelvis: spondylotic changes, severe osteoarthritis Continue Tylenol and Tramadol prn for pain, observe the patient, adding Cymbalta may help. Mid to lower back pain more on the right side back, may consider X-ray of thoracic spine. 06/01/15 CT thoracic spine showed no acute changes.  Patient received 10 injection in the back through Dr. Alvester Morin office. She says the previous problems of discomfort in the back and down the leg have completely resolved. Takes Aleve 220mg  bid   . Debility 06/20/2015   11/15/15 Hgb 12.8, Na 139, K 4.2, Bun 14, creat 0.68, TP 5.7, albumin 3.5, TSH 1.54,  Hgb a1c 4.4   . Degenerative disc disease, lumbar 10/27/2014  . Depression   . Dermatitis 03/25/2016  . Diarrhea 10/25/14  . Diverticulosis 10/27/2014  . Essential hypertension 09/20/2006   11/15/15 Hgb 12.8, Na 139, K 4.2, Bun 14, creat 0.68, TP 5.7, albumin 3.5, TSH 1.54, Hgb a1c 4.4   . Fall at nursing home 10/25/2014  . GERD (gastroesophageal reflux disease)   . History of cardiovascular disorder 09/20/2006   X 3 in 2000 now on  aspirin.    [place] Hyperlipidemia 09/20/2006   05/15/14 LDL 95   . Hypertension   . Lumbosacral spondylosis 10/28/2009  . OSTEOPOROSIS 09/20/2006   Noted 2008    . Overactive bladder 11/29/2013   Myrbetriq prescribed by urology.    . Protein calorie malnutrition ([facility])    06/06/15 TP 6.0 albumin 2.9  . SCOLIOSIS 10/28/2009  . Scoliosis 10/27/2014  . Thrombocytosis ([facility]) 05/31/2015   06/10/15 plt 602 07/23/15 plt 335 11/15/15 Hgb 12.8, Na 139, K 4.2, Bun 14, creat 0.68, TP 5.7, albumin 3.5, TSH 1.54, Hgb a1c 4.4  . TIA (transient ischemic attack) 09/20/2006   Past Surgical History:  Procedure Laterality Date  . ABDOMINAL HYSTERECTOMY    . APPENDECTOMY    . CHOLECYSTECTOMY    . TONSILLECTOMY      Allergies  Allergen Reactions  . Tramadol Other (See Comments)    Hallucinations, agitation/combativeness  . Amoxicillin     Per MAR  . Oxycodone Other (See Comments)    Per MAR   . Sulfamethoxazole     Per MAR   . Penicillins Rash    Outpatient Encounter Medications as of 11/30/2018  Medication Sig  . acetaminophen (TYLENOL) 500 MG tablet Take 500 mg by mouth 3 (three) times daily.  [place] Kitchen acetaminophen (TYLENOL) 500 MG tablet Take 500 mg by mouth every 8 (eight) hours as needed.  [place] Kitchen albuterol (PROVENTIL HFA;VENTOLIN HFA) 108 (90 BASE) MCG/ACT inhaler Inhale 2 puffs into the lungs every 6 (six) hours as needed for wheezing or shortness of breath.  [place] Kitchen alendronate (FOSAMAX) 70 MG tablet Take 70 mg by mouth once a week. Take with a full glass of water on an empty stomach. On Monday.  [place] Kitchen alum & mag hydroxide-simeth (MAALOX/MYLANTA) 200-200-20 MG/5ML suspension Take 30 mLs by mouth every 4 (four) hours as needed for indigestion or heartburn.   . calcium carbonate (OSCAL) 1500 (600 Ca) MG TABS tablet Take 600 mg of elemental calcium by mouth 2 (two) times daily with a meal.  . camphor-menthol (SARNA) lotion Apply 1 application topically as needed. To arms in am and legs at bed apply to bilateral lower extremities  every night at bedtime for itching   . Can also use as needed.  . cetaphil (CETAPHIL) cream Apply 1 application topically daily.  . clobetasol cream (TEMOVATE) 7.32 % Apply 1 application topically 2 (two) times daily as needed.  . donepezil (ARICEPT) 10 MG tablet Take 10 mg by mouth at bedtime.  . famotidine (PEPCID) 10 MG tablet Take 10 mg by mouth at bedtime.  . furosemide (LASIX) 20 MG tablet Take 20 mg by mouth daily.   [place] Kitchen losartan (COZAAR) 100 MG tablet Take 50 mg by mouth daily.   . memantine (NAMENDA) 5 MG tablet Take 5 mg by mouth 2 (two) times daily.   . Menthol, Topical Analgesic, (BIOFREEZE) 4 % GEL Apply topically 4 (four) times daily.  . metolazone (ZAROXOLYN) 2.5 MG tablet Take 2.5 mg by mouth 2 (two) times daily.  Mon and Fri  . Mirabegron ER (MYRBETRIQ) 25 MG TB24 Take 25 mg by mouth daily with breakfast. Prescribed by urology  . nitroGLYCERIN (NITROSTAT) 0.4 MG SL tablet Place 0.4 mg under the tongue every 5 (five) minutes as needed for chest pain. After taking three times and no relief call MD.  . polyethylene glycol (MIRALAX / GLYCOLAX) packet Take 17 g by mouth daily as needed for moderate constipation.   . potassium chloride SA (K-DUR) 20 MEQ tablet Take 40 mEq by mouth daily.   [place] Kitchen pyridOXINE (VITAMIN B-6) 100 MG tablet Take 100 mg by mouth daily.  [place] Kitchen zinc oxide 20 % ointment Apply 1 application topically as needed for irritation. To buttocks after every incontinent episode and as needed for redness. May keep at bedside  . [DISCONTINUED] metolazone (ZAROXOLYN) 2.5 MG tablet Take 2.5 mg by mouth daily. Take on Mon,Wed,Fri   No facility-administered encounter medications on file as of 11/30/2018.    ROS was provided with assistance of staff Review of Systems  Constitutional: Negative for activity change, appetite change, chills, diaphoresis, fatigue and fever.  HENT: Positive for hearing loss. Negative for congestion and voice change.   Respiratory: Negative for cough, chest  tightness, shortness of breath and wheezing.   Cardiovascular: Positive for leg swelling. Negative for chest pain and palpitations.  Gastrointestinal: Negative for abdominal distention, abdominal pain, constipation, diarrhea, nausea and vomiting.  Genitourinary: Negative for difficulty urinating, dysuria and urgency.  Musculoskeletal: Positive for back pain and gait problem.       Right lower back pain  Skin: Negative for color change and pallor.  Neurological: Negative for dizziness, speech difficulty, weakness and headaches.       Dementia  Psychiatric/Behavioral: Positive for dysphoric mood. Negative for agitation, behavioral problems, hallucinations and sleep disturbance. The patient is not nervous/anxious.     Immunization History  Administered Date(s) Administered  . HiB (PRP-OMP) 02/12/2004  . Influenza Split 01/11/2012  . Influenza Whole 01/07/2009, 01/15/2018  . Influenza-Unspecified 01/11/2014, 01/01/2015, 01/29/2016, 02/01/2017  . PPD Test 11/14/2014, 06/03/2015, 06/17/2015  . Pneumococcal Conjugate-13 02/08/2017  . Pneumococcal Polysaccharide-23 04/28/2010  . Tdap 08/24/2011  . Zoster 04/13/2010   Pertinent  Health Maintenance Due  Topic Date Due  . INFLUENZA VACCINE  11/12/2018  . DEXA SCAN  Completed  . PNA vac Low Risk Adult  Completed   Fall Risk  01/04/2018 12/29/2016 11/02/2016 06/20/2015 05/23/2015  Falls in the past year? No No Yes No No  Comment - - Emmi Telephone Survey: data to providers prior to load - -  Number falls in past yr: - - 2 or more - -  Comment - - Emmi Telephone Survey Actual Response = 90 - -  Injury with Fall? - - Yes - -   Functional Status Survey:    Vitals:   11/30/18 1041  BP: 140/70  Pulse: 72  Resp: 18  Temp: (!) 97 F (36.1 C)  SpO2: 95%  Weight: 152 lb 14.4 oz (69.4 kg)  Height: 5\' 5"  (1.651 m)   Body mass index is 25.44 kg/m. Physical Exam Vitals signs and nursing note reviewed.  Constitutional:      General: She is not  in acute distress.    Appearance: Normal appearance. She is not ill-appearing, toxic-appearing or diaphoretic.  HENT:     Head: Normocephalic and atraumatic.     Nose: Nose normal.     Mouth/Throat:     Mouth: Mucous membranes are moist.  Eyes:  Extraocular Movements: Extraocular movements intact.     Conjunctiva/sclera: Conjunctivae normal.     Pupils: Pupils are equal, round, and reactive to light.  Neck:     Musculoskeletal: Normal range of motion and neck supple.  Cardiovascular:     Rate and Rhythm: Normal rate and regular rhythm.     Heart sounds: No murmur.  Pulmonary:     Effort: Pulmonary effort is normal.     Breath sounds: No wheezing, rhonchi or rales.  Abdominal:     General: Bowel sounds are normal.     Palpations: Abdomen is soft.     Tenderness: There is no abdominal tenderness. There is no right CVA tenderness, left CVA tenderness, guarding or rebound.  Musculoskeletal:     Right lower leg: Edema present.     Left lower leg: Edema present.     Comments: Trace edema BLE. Right lower back pain is chronic, positional, doesn't interfere her night sleep  Skin:    General: Skin is warm and dry.  Neurological:     General: No focal deficit present.     Mental Status: She is alert. Mental status is at baseline.     Cranial Nerves: No cranial nerve deficit.     Motor: No weakness.     Coordination: Coordination normal.     Gait: Gait abnormal.     Comments: Oriented to person, place  Psychiatric:        Mood and Affect: Mood normal.        Behavior: Behavior normal.        Thought Content: Thought content normal.     Labs reviewed: Recent Labs    05/19/18 10/19/18 11/15/18  NA 141 140 142  K 3.7 3.8 3.4  CL  --   --  104  CO2  --   --  29  BUN 13 35* 43*  CREATININE 0.7 1.0 1.2*  CALCIUM  --   --  11.0   Recent Labs    05/19/18  AST 18  ALT 16   Recent Labs    05/19/18  HGB 13.0  HCT 38  PLT 351   Lab Results  Component Value Date   TSH  1.90 06/24/2017   Lab Results  Component Value Date   HGBA1C 4.4 11/14/2015   Lab Results  Component Value Date   CHOL 176 05/15/2014   HDL 62 05/15/2014   LDLCALC 95 05/15/2014   LDLDIRECT 114.3 12/08/2007   TRIG 93 05/15/2014   CHOLHDL 2.9 CALC 12/08/2007    Significant Diagnostic Results in last 30 days:  No results found.  Assessment/Plan Hypokalemia low serum potassium, 3.0 11/29/18. Hx of edema BLE, on Furosemide 20mg  qd, Metolazone 2.5 mg 2x/wk since 11/25/18, KCL 40 2x/wk. Kcl 46meq x1 11/29/18, then starts 67meq daily, f/u BMP one wk scheduled.    PVD (peripheral vascular disease) (HCC) Trace edema BLE , continue Furosemide 20mg  qd, Metolazone 2.5mg  2x/wk.   Moderate dementia without behavioral disturbance ([place]) Continue SNF [facility] for safety and care assistance, continue Memantine 5mg  bid, Donepezil 10mg  qd for memory.      Family/ staff Communication: plan of care reviewed with the patient and charge nurse.   Labs/tests ordered:  BMP one week.   Time spend 25 minutes.

## 2018-11-30 NOTE — Assessment & Plan Note (Signed)
Continue [facility] for safety and care assistance, continue Memantine 5mg  bid, Donepezil 10mg  qd for memory.

## 2018-11-30 NOTE — Assessment & Plan Note (Signed)
Trace edema BLE , continue Furosemide 20mg  qd, Metolazone 2.5mg  2x/wk.

## 2018-12-01 DIAGNOSIS — H903 Sensorineural hearing loss, bilateral: Secondary | ICD-10-CM | POA: Diagnosis not present

## 2018-12-06 DIAGNOSIS — E876 Hypokalemia: Secondary | ICD-10-CM | POA: Diagnosis not present

## 2018-12-06 LAB — BASIC METABOLIC PANEL
BUN: 18 (ref 4–21)
Creatinine: 0.9 (ref 0.5–1.1)
Glucose: 103
Potassium: 4.6 (ref 3.4–5.3)
Sodium: 141 (ref 137–147)

## 2018-12-12 ENCOUNTER — Non-Acute Institutional Stay (SKILLED_NURSING_FACILITY): Payer: Medicare Other | Admitting: Nurse Practitioner

## 2018-12-12 ENCOUNTER — Other Ambulatory Visit: Payer: Self-pay | Admitting: *Deleted

## 2018-12-12 ENCOUNTER — Encounter: Payer: Self-pay | Admitting: Nurse Practitioner

## 2018-12-12 DIAGNOSIS — K219 Gastro-esophageal reflux disease without esophagitis: Secondary | ICD-10-CM | POA: Diagnosis not present

## 2018-12-12 DIAGNOSIS — M545 Low back pain, unspecified: Secondary | ICD-10-CM

## 2018-12-12 DIAGNOSIS — F0390 Unspecified dementia without behavioral disturbance: Secondary | ICD-10-CM

## 2018-12-12 DIAGNOSIS — M25551 Pain in right hip: Secondary | ICD-10-CM

## 2018-12-12 DIAGNOSIS — R600 Localized edema: Secondary | ICD-10-CM

## 2018-12-12 DIAGNOSIS — G8929 Other chronic pain: Secondary | ICD-10-CM | POA: Diagnosis not present

## 2018-12-12 DIAGNOSIS — F03B0 Unspecified dementia, moderate, without behavioral disturbance, psychotic disturbance, mood disturbance, and anxiety: Secondary | ICD-10-CM

## 2018-12-12 LAB — CHLORIDE
Calcium: 10.9
Carbon Dioxide, Total: 29
Chloride: 104
EGFR (Non-African Amer.): 54

## 2018-12-12 NOTE — Assessment & Plan Note (Signed)
Continue [facility] for safety, care assistance, w/c for mobility, continue Memantine 5mg  bid, Donepezil 10mg  qd.

## 2018-12-12 NOTE — Assessment & Plan Note (Signed)
Stable, continue Famotidine 10mg qd.  

## 2018-12-12 NOTE — Assessment & Plan Note (Signed)
Pain with movement, weight bearing, will update X-ray R hip, pelvis, lumbar spine, continue Tylenol 500mg  tid, adding Meloxicam 7.5mg  qd x 10 days, observe.

## 2018-12-12 NOTE — Assessment & Plan Note (Signed)
Chronic, continue Tylenol, adding Meloxicam 7.5mg  qd x 10 days. Update X-ray L spine in setting of the right hip/leg pain.

## 2018-12-12 NOTE — Assessment & Plan Note (Signed)
Stable, chronic, continue Furosemide 20mg  qd, Metolazone 2.5mg  qd x2/wk.

## 2018-12-12 NOTE — Progress Notes (Signed)
Location:  [facility] [place] Room Number: 3 Place of Service:  SNF (31) Provider:  Marlana Latus  NP  Tarez Bowns X, NP  Patient Care Team: Litzy Dicker X, NP as PCP - General (Internal Medicine) Madden Garron X, NP as Nurse Practitioner (Nurse Practitioner) Virgie Dad, MD as Consulting Physician (Internal Medicine)  Extended Emergency Contact Information Primary Emergency Contact: [place] Address: [place], [place] [address] [name] of [place] Phone: [phone] Work Phone: [phone] Mobile Phone: [phone] Relation: Daughter Secondary Emergency Contact: [name] Address: [address] [place] [address] [name] of [place] Phone: [phone] Mobile Phone: [phone] Relation: Son  Code Status: DNR Goals of care: Advanced Directive information Advanced Directives 11/25/2018  Does Patient Have a Medical Advance Directive? Yes  Type of Paramedic of [place];Living will;Out of facility DNR (pink MOST or yellow form)  Does patient want to make changes to medical advance directive? No - Patient declined  Copy of [place] in Chart? Yes - validated most recent copy scanned in chart (See row information)  Would patient like information on creating a medical advance directive? -  Pre-existing out of facility DNR order (yellow form or pink MOST form) Yellow form placed in chart (order not valid for inpatient use)     Chief Complaint  Patient presents with  . Acute Visit    C/o - (R) hip pain    HPI:  Pt is a 85 y.o. female seen today for an acute visit for reported increased right leg pain with weight bearing and PROA, guards the leg, yells hurt 12/09/18, requires x2 assist/sit to stand lift 12/12/18. The patient was examined in w/c, able to rotate, bend right knee, move from side to side of the right leg. Chronic lower back pain, taking  Tylenol 500mg  tid. Chronic edema BLE, stable, on Metolazone 2.5mg  qd 2x/wk, Furosemide 20mg  qd. GERD, stable, on Famotidine 10mg  qd. Memory is preserved on Donepezil 10mg  qd, Memantine 5mg  qd.    Past Medical History:  Diagnosis Date  . Abnormality of gait 04/28/2010   History of fall onto right shoulder-has participated in PT. Uses walker.     . Acute encephalopathy 10/25/14   Occurred when septic  . Acute lower GI bleeding 10/30/2014  . Adjustment disorder with mixed anxiety and depressed mood 06/04/2015   11/15/15 Hgb 12.8, Na 139, K 4.2, Bun 14, creat 0.68, TP 5.7, albumin 3.5, TSH 1.54, Hgb a1c 4.4   . Anemia   . Aortic atherosclerosis ([place]) 10/27/2014  . Asthma   . Cerebral atrophy ([place]) 10/27/2014  . Cerebrovascular disease 10/27/2014   Small vessel disease  . Cervical disc disease   . Chronic lower back pain 06/04/2015   lumbar spondylosis. Regular f/u Dr. French Ana. Receives injections last 01/2013.  05/27/15 Xray left hip/pelvis: spondylotic changes, severe osteoarthritis Continue Tylenol and Tramadol prn for pain, observe the patient, adding Cymbalta may help. Mid to lower back pain more on the right side back, may consider X-ray of thoracic spine. 06/01/15 CT thoracic spine showed no acute changes.  Patient received 10 injection in the back through Dr. Alvester Morin office. She says the previous problems of discomfort in the back and down the leg have completely resolved. Takes Aleve 220mg  bid   . Debility 06/20/2015   11/15/15 Hgb 12.8, Na 139, K 4.2,  Bun 14, creat 0.68, TP 5.7, albumin 3.5, TSH 1.54, Hgb a1c 4.4   . Degenerative disc disease, lumbar 10/27/2014  . Depression   . Dermatitis 03/25/2016  . Diarrhea 10/25/14  . Diverticulosis 10/27/2014  . Essential hypertension 09/20/2006   11/15/15 Hgb 12.8, Na 139, K 4.2, Bun 14, creat 0.68, TP 5.7, albumin 3.5, TSH 1.54, Hgb a1c 4.4   . Fall at nursing home 10/25/2014  . GERD (gastroesophageal reflux disease)   . History of cardiovascular  disorder 09/20/2006   X 3 in 2000 now on aspirin.    Marland Kitchen Hyperlipidemia 09/20/2006   05/15/14 LDL 95   . Hypertension   . Lumbosacral spondylosis 10/28/2009  . OSTEOPOROSIS 09/20/2006   Noted 2008    . Overactive bladder 11/29/2013   Myrbetriq prescribed by urology.    . Protein calorie malnutrition ([place])    06/06/15 TP 6.0 albumin 2.9  . SCOLIOSIS 10/28/2009  . Scoliosis 10/27/2014  . Thrombocytosis ([place]) 05/31/2015   06/10/15 plt 602 07/23/15 plt 335 11/15/15 Hgb 12.8, Na 139, K 4.2, Bun 14, creat 0.68, TP 5.7, albumin 3.5, TSH 1.54, Hgb a1c 4.4  . TIA (transient ischemic attack) 09/20/2006   Past Surgical History:  Procedure Laterality Date  . ABDOMINAL HYSTERECTOMY    . APPENDECTOMY    . CHOLECYSTECTOMY    . TONSILLECTOMY      Allergies  Allergen Reactions  . Tramadol Other (See Comments)    Hallucinations, agitation/combativeness  . Amoxicillin     Per MAR  . Oxycodone Other (See Comments)    Per MAR   . Sulfamethoxazole     Per MAR   . Penicillins Rash    Outpatient Encounter Medications as of 12/12/2018  Medication Sig  . acetaminophen (TYLENOL) 500 MG tablet Take 500 mg by mouth 3 (three) times daily.  Marland Kitchen acetaminophen (TYLENOL) 500 MG tablet Take 500 mg by mouth every 8 (eight) hours as needed.  Marland Kitchen albuterol (PROVENTIL HFA;VENTOLIN HFA) 108 (90 BASE) MCG/ACT inhaler Inhale 2 puffs into the lungs every 6 (six) hours as needed for wheezing or shortness of breath.  Marland Kitchen alendronate (FOSAMAX) 70 MG tablet Take 70 mg by mouth once a week. Take with a full glass of water on an empty stomach. On Monday.  Marland Kitchen alum & mag hydroxide-simeth (MAALOX/MYLANTA) 200-200-20 MG/5ML suspension Take 30 mLs by mouth every 4 (four) hours as needed for indigestion or heartburn.   . calcium carbonate (OSCAL) 1500 (600 Ca) MG TABS tablet Take 600 mg of elemental calcium by mouth 2 (two) times daily with a meal.  . camphor-menthol (SARNA) lotion Apply 1 application topically as needed. To arms in am and legs at bed  apply to bilateral lower extremities every night at bedtime for itching   . Can also use as needed.  . cetaphil (CETAPHIL) cream Apply 1 application topically daily.  . clobetasol cream (TEMOVATE) [id number] % Apply 1 application topically 2 (two) times daily as needed.  . donepezil (ARICEPT) 10 MG tablet Take 10 mg by mouth at bedtime.  . famotidine (PEPCID) 10 MG tablet Take 10 mg by mouth at bedtime.  . furosemide (LASIX) 20 MG tablet Take 20 mg by mouth daily.   Marland Kitchen losartan (COZAAR) 100 MG tablet Take 50 mg by mouth daily.   . memantine (NAMENDA) 5 MG tablet Take 5 mg by mouth 2 (two) times daily.   . Menthol, Topical Analgesic, (BIOFREEZE) 4 % GEL Apply topically 4 (four) times daily.  . metolazone (ZAROXOLYN) 2.5 MG  tablet Take 2.5 mg by mouth daily. Mon and Fri   . Mirabegron ER (MYRBETRIQ) 25 MG TB24 Take 25 mg by mouth daily with breakfast. Prescribed by urology  . nitroGLYCERIN (NITROSTAT) 0.4 MG SL tablet Place 0.4 mg under the tongue every 5 (five) minutes as needed for chest pain. After taking three times and no relief call MD.  . polyethylene glycol (MIRALAX / GLYCOLAX) packet Take 17 g by mouth daily as needed for moderate constipation.   . potassium chloride SA (K-DUR) 20 MEQ tablet Take 40 mEq by mouth daily.   Marland Kitchen pyridOXINE (VITAMIN B-6) 100 MG tablet Take 100 mg by mouth daily.  Marland Kitchen zinc oxide 20 % ointment Apply 1 application topically as needed for irritation. To buttocks after every incontinent episode and as needed for redness. May keep at bedside   No facility-administered encounter medications on file as of 12/12/2018.    ROS was provided with assistance of staff.  Review of Systems  Constitutional: Negative for activity change, appetite change, chills, diaphoresis, fatigue, fever and unexpected weight change.  HENT: Positive for hearing loss. Negative for congestion and voice change.   Respiratory: Negative for cough, shortness of breath and wheezing.   Cardiovascular:  Positive for leg swelling. Negative for chest pain and palpitations.  Gastrointestinal: Negative for abdominal distention, abdominal pain, constipation, diarrhea, nausea and vomiting.  Genitourinary: Negative for difficulty urinating, dysuria and urgency.  Musculoskeletal: Positive for arthralgias, back pain and gait problem.  Skin: Negative for color change and pallor.  Neurological: Negative for dizziness, speech difficulty, weakness and headaches.       Dementia  Psychiatric/Behavioral: Positive for dysphoric mood. Negative for agitation, behavioral problems, hallucinations and sleep disturbance. The patient is not nervous/anxious.     Immunization History  Administered Date(s) Administered  . HiB (PRP-OMP) 02/12/2004  . Influenza Split 01/11/2012  . Influenza Whole 01/07/2009, 01/15/2018  . Influenza-Unspecified 01/11/2014, 01/01/2015, 01/29/2016, 02/01/2017  . PPD Test 11/14/2014, 06/03/2015, 06/17/2015  . Pneumococcal Conjugate-13 02/08/2017  . Pneumococcal Polysaccharide-23 04/28/2010  . Tdap 08/24/2011  . Zoster 04/13/2010   Pertinent  Health Maintenance Due  Topic Date Due  . INFLUENZA VACCINE  11/12/2018  . DEXA SCAN  Completed  . PNA vac Low Risk Adult  Completed   Fall Risk  01/04/2018 12/29/2016 11/02/2016 06/20/2015 05/23/2015  Falls in the past year? No No Yes No No  Comment - - Emmi Telephone Survey: data to providers prior to load - -  Number falls in past yr: - - 2 or more - -  Comment - - Emmi Telephone Survey Actual Response = 90 - -  Injury with Fall? - - Yes - -   Functional Status Survey:    Vitals:   12/12/18 1128  BP: 140/68  Pulse: 72  Resp: 20  Temp: 98.7 F (37.1 C)  SpO2: 96%  Weight: 152 lb 14.4 oz (69.4 kg)  Height: 5\' 5"  (1.651 m)   Body mass index is 25.44 kg/m. Physical Exam Vitals signs and nursing note reviewed.  Constitutional:      General: She is not in acute distress.    Appearance: Normal appearance. She is normal weight. She is  not ill-appearing, toxic-appearing or diaphoretic.  HENT:     Head: Normocephalic and atraumatic.     Nose: Nose normal.     Mouth/Throat:     Mouth: Mucous membranes are moist.  Eyes:     Extraocular Movements: Extraocular movements intact.     Conjunctiva/sclera: Conjunctivae normal.  Pupils: Pupils are equal, round, and reactive to light.  Neck:     Musculoskeletal: Normal range of motion and neck supple.  Cardiovascular:     Rate and Rhythm: Normal rate and regular rhythm.     Heart sounds: No murmur.  Pulmonary:     Breath sounds: No wheezing, rhonchi or rales.  Abdominal:     Palpations: Abdomen is soft.     Tenderness: There is no abdominal tenderness. There is no right CVA tenderness, left CVA tenderness, guarding or rebound.  Musculoskeletal:     Right lower leg: Edema present.     Left lower leg: Edema present.     Comments: 1+ edema BLE, needs assistance for transfer, w/c for mobility. Right lower back is not new, right hip pain with weight bearing is new, no pain with right leg move from side to side or bending right knee or lift right leg while in w/c.   Skin:    General: Skin is warm and dry.  Neurological:     General: No focal deficit present.     Mental Status: She is alert. Mental status is at baseline.     Cranial Nerves: No cranial nerve deficit.     Motor: Weakness present.     Coordination: Coordination normal.     Gait: Gait abnormal.     Comments: Oriented to person, place.   Psychiatric:        Mood and Affect: Mood normal.        Behavior: Behavior normal.     Labs reviewed: Recent Labs    10/19/18 11/15/18 12/06/18  NA 140 142 141  K 3.8 3.4 4.6  CL  --  104 104  CO2  --  29 29  BUN 35* 43* 18  CREATININE 1.0 1.2* 0.9  CALCIUM  --  11.0 10.9   Recent Labs    05/19/18  AST 18  ALT 16   Recent Labs    05/19/18  HGB 13.0  HCT 38  PLT 351   Lab Results  Component Value Date   TSH 1.90 06/24/2017   Lab Results  Component  Value Date   HGBA1C 4.4 11/14/2015   Lab Results  Component Value Date   CHOL 176 05/15/2014   HDL 62 05/15/2014   LDLCALC 95 05/15/2014   LDLDIRECT 114.3 12/08/2007   TRIG 93 05/15/2014   CHOLHDL 2.9 CALC 12/08/2007    Significant Diagnostic Results in last 30 days:  No results found.  Assessment/Plan Right hip pain Pain with movement, weight bearing, will update X-ray R hip, pelvis, lumbar spine, continue Tylenol 500mg  tid, adding Meloxicam 7.5mg  qd x 10 days, observe.   Chronic midline low back pain without sciatica Chronic, continue Tylenol, adding Meloxicam 7.5mg  qd x 10 days. Update X-ray L spine in setting of the right hip/leg pain.   Leg edema Stable, chronic, continue Furosemide 20mg  qd, Metolazone 2.5mg  qd x2/wk.   Moderate dementia without behavioral disturbance ([place]) Continue SN[facility] for safety, care assistance, w/c for mobility, continue Memantine 5mg  bid, Donepezil 10mg  qd.   GERD (gastroesophageal reflux disease) Stable, continue Famotidine 10mg  qd.      Family/ staff Communication:   Labs/tests ordered:  X-ray R hip, pelvis, lumbar spine.   Time spend 25 minutes.

## 2018-12-21 ENCOUNTER — Encounter: Payer: Self-pay | Admitting: *Deleted

## 2018-12-21 ENCOUNTER — Encounter: Payer: Self-pay | Admitting: Nurse Practitioner

## 2018-12-21 ENCOUNTER — Non-Acute Institutional Stay (SKILLED_NURSING_FACILITY): Payer: Medicare Other | Admitting: Nurse Practitioner

## 2018-12-21 DIAGNOSIS — G8929 Other chronic pain: Secondary | ICD-10-CM

## 2018-12-21 DIAGNOSIS — F0390 Unspecified dementia without behavioral disturbance: Secondary | ICD-10-CM | POA: Diagnosis not present

## 2018-12-21 DIAGNOSIS — K219 Gastro-esophageal reflux disease without esophagitis: Secondary | ICD-10-CM

## 2018-12-21 DIAGNOSIS — N3281 Overactive bladder: Secondary | ICD-10-CM | POA: Diagnosis not present

## 2018-12-21 DIAGNOSIS — M545 Low back pain: Secondary | ICD-10-CM | POA: Diagnosis not present

## 2018-12-21 DIAGNOSIS — F03B0 Unspecified dementia, moderate, without behavioral disturbance, psychotic disturbance, mood disturbance, and anxiety: Secondary | ICD-10-CM

## 2018-12-21 DIAGNOSIS — I10 Essential (primary) hypertension: Secondary | ICD-10-CM | POA: Diagnosis not present

## 2018-12-21 DIAGNOSIS — R600 Localized edema: Secondary | ICD-10-CM

## 2018-12-21 NOTE — Progress Notes (Signed)
Location:  [facility] Room Number: 3 Place of Service:  SNF (31) Provider:  Marlana Latus  NP  Tarez Bowns X, NP  Patient Care Team: Noemi Bellissimo X, NP as PCP - General (Internal Medicine) Alana Dayton X, NP as Nurse Practitioner (Nurse Practitioner) Virgie Dad, MD as Consulting Physician (Internal Medicine)  Extended Emergency Contact Information Primary Emergency Contact: [place] Address: [place], [place] [address] [name] of [place] Phone: [phone] Work Phone: [phone] Mobile Phone: [phone] Relation: Daughter Secondary Emergency Contact: [name] Address: [address] [place] [address] [name] of [place] Phone: [phone] Mobile Phone: [phone] Relation: Son  Code Status:  DNR Goals of care: Advanced Directive information Advanced Directives 12/21/2018  Does Patient Have a Medical Advance Directive? Yes  Type of Paramedic of [place];Living will;Out of facility DNR (pink MOST or yellow form)  Does patient want to make changes to medical advance directive? No - Patient declined  Copy of [place] in Chart? Yes - validated most recent copy scanned in chart (See row information)  Would patient like information on creating a medical advance directive? No - Patient declined  Pre-existing out of facility DNR order (yellow form or pink MOST form) Yellow form placed in chart (order not valid for inpatient use)     Chief Complaint  Patient presents with  . Medical Management of Chronic Issues    HPI:  Pt is a 85 y.o. female seen today for medical management of chronic diseases.    The patient has history of edema BLE, trace, on Metolazone 2.5mg  2x/wk, Furosemide 20mg   qd. Urinary frequency, stable, on Myrbetriq 25mg  qd. Her memory is preserved on Memantine 5mg  bid, Donepezil 10mg  qd. Lower back pain, on right,  stable on Tylenol 500mg  tid, Meloxicam 7.5mg  qd. HTN, blood pressure is controlled on Losartan 50mg  qd. GERD stable, on Famotidine 10mg  qd.    Past Medical History:  Diagnosis Date  . Abnormality of gait 04/28/2010   History of fall onto right shoulder-has participated in PT. Uses walker.     . Acute encephalopathy 10/25/14   Occurred when septic  . Acute lower GI bleeding 10/30/2014  . Adjustment disorder with mixed anxiety and depressed mood 06/04/2015   11/15/15 Hgb 12.8, Na 139, K 4.2, Bun 14, creat 0.68, TP 5.7, albumin 3.5, TSH 1.54, Hgb a1c 4.4   . Anemia   . Aortic atherosclerosis ([place]) 10/27/2014  . Asthma   . Cerebral atrophy ([place]) 10/27/2014  . Cerebrovascular disease 10/27/2014   Small vessel disease  . Cervical disc disease   . Chronic lower back pain 06/04/2015   lumbar spondylosis. Regular f/u Dr. French Ana. Receives injections last 01/2013.  05/27/15 Xray left hip/pelvis: spondylotic changes, severe osteoarthritis Continue Tylenol and Tramadol prn for pain, observe the patient, adding Cymbalta may help. Mid to lower back pain more on the right side back, may consider X-ray of thoracic spine. 06/01/15 CT thoracic spine showed no acute changes.  Patient received 10 injection in the back through Dr. Alvester Morin office. She says the previous problems of discomfort in the back and down the leg have completely resolved. Takes Aleve 220mg  bid   . Debility 06/20/2015   11/15/15 Hgb 12.8, Na 139, K 4.2, Bun 14, creat 0.68, TP 5.7, albumin 3.5, TSH 1.54, Hgb a1c 4.4   .  Degenerative disc disease, lumbar 10/27/2014  . Depression   . Dermatitis 03/25/2016  . Diarrhea 10/25/14  . Diverticulosis 10/27/2014  . Essential hypertension 09/20/2006   11/15/15 Hgb 12.8, Na 139, K 4.2, Bun 14, creat 0.68, TP 5.7, albumin 3.5, TSH 1.54, Hgb a1c 4.4   . Fall at nursing home 10/25/2014  . GERD (gastroesophageal reflux disease)   . History of cardiovascular disorder 09/20/2006   X 3 in 2000 now on aspirin.    Marland Kitchen  Hyperlipidemia 09/20/2006   05/15/14 LDL 95   . Hypertension   . Lumbosacral spondylosis 10/28/2009  . OSTEOPOROSIS 09/20/2006   Noted 2008    . Overactive bladder 11/29/2013   Myrbetriq prescribed by urology.    . Protein calorie malnutrition ([place])    06/06/15 TP 6.0 albumin 2.9  . SCOLIOSIS 10/28/2009  . Scoliosis 10/27/2014  . Thrombocytosis ([place]) 05/31/2015   06/10/15 plt 602 07/23/15 plt 335 11/15/15 Hgb 12.8, Na 139, K 4.2, Bun 14, creat 0.68, TP 5.7, albumin 3.5, TSH 1.54, Hgb a1c 4.4  . TIA (transient ischemic attack) 09/20/2006   Past Surgical History:  Procedure Laterality Date  . ABDOMINAL HYSTERECTOMY    . APPENDECTOMY    . CHOLECYSTECTOMY    . TONSILLECTOMY      Allergies  Allergen Reactions  . Tramadol Other (See Comments)    Hallucinations, agitation/combativeness  . Amoxicillin     Per MAR  . Oxycodone Other (See Comments)    Per MAR   . Sulfamethoxazole     Per MAR   . Penicillins Rash    Outpatient Encounter Medications as of 12/21/2018  Medication Sig  . acetaminophen (TYLENOL) 500 MG tablet Take 500 mg by mouth 3 (three) times daily.  Marland Kitchen acetaminophen (TYLENOL) 500 MG tablet Take 500 mg by mouth every 8 (eight) hours as needed.  Marland Kitchen albuterol (PROVENTIL HFA;VENTOLIN HFA) 108 (90 BASE) MCG/ACT inhaler Inhale 2 puffs into the lungs every 6 (six) hours as needed for wheezing or shortness of breath.  Marland Kitchen alendronate (FOSAMAX) 70 MG tablet Take 70 mg by mouth once a week. Take with a full glass of water on an empty stomach. On Monday.  Marland Kitchen alum & mag hydroxide-simeth (MAALOX/MYLANTA) 200-200-20 MG/5ML suspension Take 30 mLs by mouth every 4 (four) hours as needed for indigestion or heartburn.   . calcium carbonate (OSCAL) 1500 (600 Ca) MG TABS tablet Take 600 mg of elemental calcium by mouth 2 (two) times daily with a meal.  . camphor-menthol (SARNA) lotion Apply 1 application topically as needed. To arms in am and legs at bed apply to bilateral lower extremities every night at  bedtime for itching   . Can also use as needed.  . cetaphil (CETAPHIL) cream Apply 1 application topically daily. APPLY  TO BILATERAL ARMS AND LEGS TO MOISTURIZE SKIN  . clobetasol cream (TEMOVATE) [id number] % Apply 1 application topically 2 (two) times daily as needed.  . donepezil (ARICEPT) 10 MG tablet Take 10 mg by mouth at bedtime.  . famotidine (PEPCID) 10 MG tablet Take 10 mg by mouth at bedtime.  . furosemide (LASIX) 20 MG tablet Take 20 mg by mouth daily.   Marland Kitchen losartan (COZAAR) 100 MG tablet Take 50 mg by mouth daily.   . meloxicam (MOBIC) 7.5 MG tablet Take 7.5 mg by mouth daily.  . memantine (NAMENDA) 5 MG tablet Take 5 mg by mouth 2 (two) times daily.   . Menthol, Topical Analgesic, (BIOFREEZE) 4 % GEL Apply topically 4 (four) times  daily. To lower back  . metolazone (ZAROXOLYN) 2.5 MG tablet Take 2.5 mg by mouth daily. Mon and Fri   . Mirabegron ER (MYRBETRIQ) 25 MG TB24 Take 25 mg by mouth daily with breakfast. Prescribed by urology  . nitroGLYCERIN (NITROSTAT) 0.4 MG SL tablet Place 0.4 mg under the tongue every 5 (five) minutes as needed for chest pain. After taking three times and no relief call MD.  . polyethylene glycol (MIRALAX / GLYCOLAX) packet Take 17 g by mouth daily as needed for moderate constipation.   . potassium chloride SA (K-DUR) 20 MEQ tablet Take 40 mEq by mouth daily.   Marland Kitchen pyridOXINE (VITAMIN B-6) 100 MG tablet Take 100 mg by mouth daily.  Marland Kitchen zinc oxide 20 % ointment Apply 1 application topically as needed for irritation. To buttocks after every incontinent episode and as needed for redness. May keep at bedside   No facility-administered encounter medications on file as of 12/21/2018.    ROS was provided with assistance of staff.  Review of Systems  Constitutional: Positive for fatigue. Negative for activity change, appetite change, chills, diaphoresis, fever and unexpected weight change.  HENT: Positive for hearing loss. Negative for congestion and voice change.    Respiratory: Negative for cough, shortness of breath and wheezing.   Cardiovascular: Positive for leg swelling. Negative for chest pain and palpitations.  Gastrointestinal: Negative for abdominal distention, abdominal pain, constipation, diarrhea, nausea and vomiting.  Genitourinary: Negative for difficulty urinating, dysuria and urgency.  Musculoskeletal: Positive for back pain and gait problem.  Skin: Negative for color change and pallor.  Neurological: Negative for dizziness, speech difficulty, weakness and headaches.       Dementia  Psychiatric/Behavioral: Positive for dysphoric mood. Negative for agitation, behavioral problems, hallucinations and sleep disturbance. The patient is not nervous/anxious.     Immunization History  Administered Date(s) Administered  . HiB (PRP-OMP) 02/12/2004  . Influenza Split 01/11/2012  . Influenza Whole 01/07/2009, 01/15/2018  . Influenza-Unspecified 01/11/2014, 01/01/2015, 01/29/2016, 02/01/2017  . PPD Test 11/14/2014, 06/03/2015, 06/17/2015  . Pneumococcal Conjugate-13 02/08/2017  . Pneumococcal Polysaccharide-23 04/28/2010  . Tdap 08/24/2011  . Zoster 04/13/2010   Pertinent  Health Maintenance Due  Topic Date Due  . INFLUENZA VACCINE  11/12/2018  . DEXA SCAN  Completed  . PNA vac Low Risk Adult  Completed   Fall Risk  01/04/2018 12/29/2016 11/02/2016 06/20/2015 05/23/2015  Falls in the past year? No No Yes No No  Comment - - [facility]i Telephone Survey: data to providers prior to load - -  Number falls in past yr: - - 2 or more - -  Comment - - Emmi Telephone Survey Actual Response = 90 - -  Injury with Fall? - - Yes - -   Functional Status Survey:    Vitals:   12/21/18 1230  BP: 138/80  Pulse: 84  Resp: 17  Temp: (!) 97.1 F (36.2 C)  SpO2: 98%  Weight: 150 lb 14.4 oz (68.4 kg)  Height: 5\' 5"  (1.651 m)   Body mass index is 25.11 kg/m. Physical Exam Vitals signs and nursing note reviewed.  Constitutional:      General: She is not in  acute distress.    Appearance: Normal appearance. She is normal weight. She is not ill-appearing, toxic-appearing or diaphoretic.  HENT:     Head: Normocephalic and atraumatic.     Nose: Nose normal.     Mouth/Throat:     Mouth: Mucous membranes are moist.  Eyes:  Extraocular Movements: Extraocular movements intact.     Conjunctiva/sclera: Conjunctivae normal.     Pupils: Pupils are equal, round, and reactive to light.  Neck:     Musculoskeletal: Normal range of motion and neck supple.  Cardiovascular:     Rate and Rhythm: Normal rate and regular rhythm.     Heart sounds: No murmur.  Pulmonary:     Breath sounds: No wheezing, rhonchi or rales.  Abdominal:     General: Bowel sounds are normal. There is no distension.     Palpations: Abdomen is soft.     Tenderness: There is no abdominal tenderness. There is no right CVA tenderness, left CVA tenderness, guarding or rebound.  Musculoskeletal:        General: Tenderness present.     Right lower leg: Edema present.     Left lower leg: Edema present.     Comments: Trace edema BLE. Right lower back pain, positional.   Skin:    General: Skin is warm and dry.  Neurological:     General: No focal deficit present.     Mental Status: She is alert. Mental status is at baseline.     Cranial Nerves: No cranial nerve deficit.     Motor: No weakness.     Gait: Gait abnormal.     Comments: Oriented to self.   Psychiatric:        Mood and Affect: Mood normal.        Behavior: Behavior normal.     Labs reviewed: Recent Labs    10/19/18 11/15/18 12/06/18  NA 140 142 141  K 3.8 3.4 4.6  CL  --  104 104  CO2  --  29 29  BUN 35* 43* 18  CREATININE 1.0 1.2* 0.9  CALCIUM  --  11.0 10.9   Recent Labs    05/19/18  AST 18  ALT 16   Recent Labs    05/19/18  HGB 13.0  HCT 38  PLT 351   Lab Results  Component Value Date   TSH 1.90 06/24/2017   Lab Results  Component Value Date   HGBA1C 4.4 11/14/2015   Lab Results   Component Value Date   CHOL 176 05/15/2014   HDL 62 05/15/2014   LDLCALC 95 05/15/2014   LDLDIRECT 114.3 12/08/2007   TRIG 93 05/15/2014   CHOLHDL 2.9 CALC 12/08/2007    Significant Diagnostic Results in last 30 days:  No results found.  Assessment/Plan Essential hypertension Blood pressure is controlled, continue Losartan 50mg  qd.   GERD (gastroesophageal reflux disease) Stable, continue Famotidine 10mg  qd.   Moderate dementia without behavioral disturbance ([place]) Continue SNF [facility] for safety, care assistance, continue Memantine 5mg  bid, Donepezil 10mg  qd.   Overactive bladder Stable, continue Myrbetriq 25mg  qd.   Leg edema Trace edema BLE, continue Metolazone 2.5mg  po qd 2x/wk, Furosemide 20mg  qd.   Chronic midline low back pain without sciatica Continue Meloxicam 7.5mg  qd, Tylenol 500mg  tid.      Family/ staff Communication: plan of care reviewed with the patient and charge nurse.   Labs/tests ordered:  None  Time spend 25 minutes.

## 2018-12-26 ENCOUNTER — Encounter: Payer: Self-pay | Admitting: Nurse Practitioner

## 2018-12-26 NOTE — Assessment & Plan Note (Signed)
Trace edema BLE, continue Metolazone 2.5mg  po qd 2x/wk, Furosemide 20mg  qd.

## 2018-12-26 NOTE — Assessment & Plan Note (Signed)
Continue SNF [facility] for safety, care assistance, continue Memantine 5mg  bid, Donepezil 10mg  qd.

## 2018-12-26 NOTE — Assessment & Plan Note (Signed)
Continue Meloxicam 7.5mg  qd, Tylenol 500mg  tid.

## 2018-12-26 NOTE — Assessment & Plan Note (Signed)
Blood pressure is controlled, continue Losartan 50mg qd.  

## 2018-12-26 NOTE — Assessment & Plan Note (Signed)
Stable, continue Famotidine 10mg qd.  

## 2018-12-26 NOTE — Assessment & Plan Note (Signed)
Stable, continue Myrbetriq 25mg  qd.

## 2018-12-27 DIAGNOSIS — K2971 Gastritis, unspecified, with bleeding: Secondary | ICD-10-CM | POA: Diagnosis not present

## 2018-12-27 LAB — BASIC METABOLIC PANEL
BUN: 20 (ref 4–21)
Creatinine: 1 (ref 0.5–1.1)
Glucose: 102
Potassium: 4.3 (ref 3.4–5.3)
Sodium: 142 (ref 137–147)

## 2019-01-05 ENCOUNTER — Encounter: Payer: Self-pay | Admitting: *Deleted

## 2019-01-05 ENCOUNTER — Encounter: Payer: Self-pay | Admitting: Internal Medicine

## 2019-01-05 ENCOUNTER — Non-Acute Institutional Stay (SKILLED_NURSING_FACILITY): Payer: Medicare Other | Admitting: Internal Medicine

## 2019-01-05 DIAGNOSIS — I83019 Varicose veins of right lower extremity with ulcer of unspecified site: Secondary | ICD-10-CM

## 2019-01-05 DIAGNOSIS — R600 Localized edema: Secondary | ICD-10-CM

## 2019-01-05 DIAGNOSIS — L97919 Non-pressure chronic ulcer of unspecified part of right lower leg with unspecified severity: Secondary | ICD-10-CM | POA: Diagnosis not present

## 2019-01-05 DIAGNOSIS — I10 Essential (primary) hypertension: Secondary | ICD-10-CM | POA: Diagnosis not present

## 2019-01-05 DIAGNOSIS — I739 Peripheral vascular disease, unspecified: Secondary | ICD-10-CM | POA: Diagnosis not present

## 2019-01-05 LAB — CHLORIDE
Calcium: 10.4
Carbon Dioxide, Total: 29
Chloride: 106
EGFR (Non-African Amer.): 49

## 2019-01-05 NOTE — Progress Notes (Signed)
Location:  [facility] Room Number: 3 Place of Service:  SNF (31) Provider:  Veleta Miners MD  [name], [name], NP  Patient Care Team: [name], [name], NP as PCP - General (Internal Medicine) [name], [name], NP as Nurse Practitioner (Nurse Practitioner) Virgie Dad, MD as Consulting Physician (Internal Medicine)  Extended Emergency Contact Information Primary Emergency Contact: [name] Address: [place], [place] [address] [name] of [place] Phone: [phone] Work Phone: [phone] Mobile Phone: [phone] Relation: Daughter Secondary Emergency Contact: [name] Address: [address] [place] [address] [name] of [place] Phone: [phone] Mobile Phone: [phone] Relation: Son  Code Status:  DNR Goals of care: Advanced Directive information Advanced Directives 12/21/2018  Does Patient Have a Medical Advance Directive? Yes  Type of Paramedic of [place];Living will;Out of facility DNR (pink MOST or yellow form)  Does patient want to make changes to medical advance directive? No - Patient declined  Copy of [place] in Chart? Yes - validated most recent copy scanned in chart (See row information)  Would patient like information on creating a medical advance directive? No - Patient declined  Pre-existing out of facility DNR order (yellow form or pink MOST form) Yellow form placed in chart (order not valid for inpatient use)     Chief Complaint  Patient presents with  . Acute Visit    C/o - depression    HPI:  Pt is a 85 y.o. female seen today for an acute visit for Depression per Staff and her daughter Patient has h/o Hypertension, Bilateral LE edema, Lower Back Pain, Osteoporosis urinary Incontinence and Dementia Patient was recently treated for Venous Ulcer which has now resolved She had to be started on metolazone to  help with her LE swelling ulcer and drainage But now her BP is running low. So Amlodipine was discontinued and Cozaar was reduced in dose. Patient is unable to give any history But Per Nurses they have noticed her to be depressed and presenting as Angry and agitated. Her daughter also think that it is due to her depression. Her weight is stable Eating well. Looked in good mood today and will answer me by nodding her head       Past Medical History:  Diagnosis Date  . Abnormality of gait 04/28/2010   History of fall onto right shoulder-has participated in PT. Uses walker.     . Acute encephalopathy 10/25/14   Occurred when septic  . Acute lower GI bleeding 10/30/2014  . Adjustment disorder with mixed anxiety and depressed mood 06/04/2015   11/15/15 Hgb 12.8, Na 139, K 4.2, Bun 14, creat 0.68, TP 5.7, albumin 3.5, TSH 1.54, Hgb a1c 4.4   . Anemia   . Aortic atherosclerosis ([place]) 10/27/2014  . Asthma   . Cerebral atrophy ([place]) 10/27/2014  . Cerebrovascular disease 10/27/2014   Small vessel disease  . Cervical disc disease   . Chronic lower back pain 06/04/2015   lumbar spondylosis. Regular f/u Dr. French Ana. Receives injections last 01/2013.  05/27/15 Xray left hip/pelvis: spondylotic changes, severe osteoarthritis Continue Tylenol and Tramadol prn for pain, observe the patient, adding Cymbalta may help. Mid to lower back pain more on the right side back, may consider X-ray of thoracic spine. 06/01/15 CT thoracic spine showed no acute changes.  Patient received 10 injection  in the back through Dr. Alvester Morin office. She says the previous problems of discomfort in the back and down the leg have completely resolved. Takes Aleve 220mg  bid   . Debility 06/20/2015   11/15/15 Hgb 12.8, Na 139, K 4.2, Bun 14, creat 0.68, TP 5.7, albumin 3.5, TSH 1.54, Hgb a1c 4.4   . Degenerative disc disease, lumbar 10/27/2014  . Depression   . Dermatitis 03/25/2016  . Diarrhea 10/25/14  . Diverticulosis 10/27/2014  .  Essential hypertension 09/20/2006   11/15/15 Hgb 12.8, Na 139, K 4.2, Bun 14, creat 0.68, TP 5.7, albumin 3.5, TSH 1.54, Hgb a1c 4.4   . Fall at nursing home 10/25/2014  . GERD (gastroesophageal reflux disease)   . History of cardiovascular disorder 09/20/2006   X 3 in 2000 now on aspirin.    Marland Kitchen Hyperlipidemia 09/20/2006   05/15/14 LDL 95   . Hypertension   . Lumbosacral spondylosis 10/28/2009  . OSTEOPOROSIS 09/20/2006   Noted 2008    . Overactive bladder 11/29/2013   Myrbetriq prescribed by urology.    . Protein calorie malnutrition ([place])    06/06/15 TP 6.0 albumin 2.9  . SCOLIOSIS 10/28/2009  . Scoliosis 10/27/2014  . Thrombocytosis ([place]) 05/31/2015   06/10/15 plt 602 07/23/15 plt 335 11/15/15 Hgb 12.8, Na 139, K 4.2, Bun 14, creat 0.68, TP 5.7, albumin 3.5, TSH 1.54, Hgb a1c 4.4  . TIA (transient ischemic attack) 09/20/2006   Past Surgical History:  Procedure Laterality Date  . ABDOMINAL HYSTERECTOMY    . APPENDECTOMY    . CHOLECYSTECTOMY    . TONSILLECTOMY      Allergies  Allergen Reactions  . Tramadol Other (See Comments)    Hallucinations, agitation/combativeness  . Amoxicillin     Per MAR  . Oxycodone Other (See Comments)    Per MAR   . Sulfamethoxazole     Per MAR   . Penicillins Rash    Outpatient Encounter Medications as of 01/05/2019  Medication Sig  . acetaminophen (TYLENOL) 500 MG tablet Take 500 mg by mouth 3 (three) times daily.  Marland Kitchen acetaminophen (TYLENOL) 500 MG tablet Take 500 mg by mouth every 8 (eight) hours as needed.  Marland Kitchen albuterol (PROVENTIL HFA;VENTOLIN HFA) 108 (90 BASE) MCG/ACT inhaler Inhale 2 puffs into the lungs every 6 (six) hours as needed for wheezing or shortness of breath.  Marland Kitchen alendronate (FOSAMAX) 70 MG tablet Take 70 mg by mouth once a week. Take with a full glass of water on an empty stomach. On Monday.  Marland Kitchen alum & mag hydroxide-simeth (MAALOX/MYLANTA) 200-200-20 MG/5ML suspension Take 30 mLs by mouth every 4 (four) hours as needed for indigestion or heartburn.    . calcium carbonate (OSCAL) 1500 (600 Ca) MG TABS tablet Take 600 mg of elemental calcium by mouth 2 (two) times daily with a meal.  . camphor-menthol (SARNA) lotion Apply 1 application topically as needed. To arms in am and legs at bed apply to bilateral lower extremities every night at bedtime for itching   . Can also use as needed.  . cetaphil (CETAPHIL) cream Apply 1 application topically daily. APPLY  TO BILATERAL ARMS AND LEGS TO MOISTURIZE SKIN  . clobetasol cream (TEMOVATE) [id number] % Apply 1 application topically 2 (two) times daily as needed.  . donepezil (ARICEPT) 10 MG tablet Take 10 mg by mouth at bedtime.  . famotidine (PEPCID) 10 MG tablet Take 10 mg by mouth at bedtime.  . furosemide (LASIX) 20 MG tablet Take 20 mg by mouth daily.   Marland Kitchen  lactose free nutrition (BOOST) LIQD Take 237 mLs by mouth daily.  Marland Kitchen losartan (COZAAR) 100 MG tablet Take 50 mg by mouth daily. Take 1/2 tablet to equal 50 mg.  . memantine (NAMENDA) 5 MG tablet Take 5 mg by mouth 2 (two) times daily.   . Menthol, Topical Analgesic, (BIOFREEZE) 4 % GEL Apply topically 4 (four) times daily. To lower back  . metolazone (ZAROXOLYN) 2.5 MG tablet Take 2.5 mg by mouth daily. Mon and Fri   . Mirabegron ER (MYRBETRIQ) 25 MG TB24 Take 25 mg by mouth daily with breakfast. Prescribed by urology  . nitroGLYCERIN (NITROSTAT) 0.4 MG SL tablet Place 0.4 mg under the tongue every 5 (five) minutes as needed for chest pain. After taking three times and no relief call MD.  . polyethylene glycol (MIRALAX / GLYCOLAX) packet Take 17 g by mouth daily as needed for moderate constipation.   . potassium chloride SA (K-DUR) 20 MEQ tablet Take 40 mEq by mouth daily.   Marland Kitchen pyridOXINE (VITAMIN B-6) 100 MG tablet Take 100 mg by mouth daily.  Marland Kitchen zinc oxide 20 % ointment Apply 1 application topically as needed for irritation. To buttocks after every incontinent episode and as needed for redness. May keep at bedside  . [DISCONTINUED] meloxicam (MOBIC) 7.5  MG tablet Take 7.5 mg by mouth daily.   No facility-administered encounter medications on file as of 01/05/2019.     Review of Systems  Cardiovascular: Positive for leg swelling.  Neurological: Positive for weakness.  Psychiatric/Behavioral: Positive for behavioral problems and confusion. The patient is nervous/anxious.   All other systems reviewed and are negative.   Immunization History  Administered Date(s) Administered  . HiB (PRP-OMP) 02/12/2004  . Influenza Split 01/11/2012  . Influenza Whole 01/07/2009, 01/15/2018  . Influenza-Unspecified 01/11/2014, 01/01/2015, 01/29/2016, 02/01/2017  . PPD Test 11/14/2014, 06/03/2015, 06/17/2015  . Pneumococcal Conjugate-13 02/08/2017  . Pneumococcal Polysaccharide-23 04/28/2010  . Tdap 08/24/2011  . Zoster 04/13/2010   Pertinent  Health Maintenance Due  Topic Date Due  . INFLUENZA VACCINE  11/12/2018  . DEXA SCAN  Completed  . PNA vac Low Risk Adult  Completed   Fall Risk  01/04/2018 12/29/2016 11/02/2016 06/20/2015 05/23/2015  Falls in the past year? No No Yes No No  Comment - - Emmi Telephone Survey: data to providers prior to load - -  Number falls in past yr: - - 2 or more - -  Comment - - Emmi Telephone Survey Actual Response = 90 - -  Injury with Fall? - - Yes - -   Functional Status Survey:    Vitals:   01/05/19 0832  BP: 138/80  Pulse: 84  Resp: 17  Temp: (!) 96.9 F (36.1 C)  SpO2: 91%  Weight: 150 lb 14.4 oz (68.4 kg)  Height: 5\' 5"  (1.651 m)   Body mass index is 25.11 kg/m. Physical Exam Vitals signs reviewed.  Constitutional:      Appearance: Normal appearance.  HENT:     Head: Normocephalic.     Nose: Nose normal.     Mouth/Throat:     Mouth: Mucous membranes are moist.     Pharynx: Oropharynx is clear.  Eyes:     Pupils: Pupils are equal, round, and reactive to light.  Neck:     Musculoskeletal: Neck supple.  Cardiovascular:     Rate and Rhythm: Normal rate and regular rhythm.     Pulses: Normal  pulses.  Pulmonary:     Effort: Pulmonary effort is  normal.     Breath sounds: Normal breath sounds.  Abdominal:     General: Abdomen is flat. Bowel sounds are normal.     Palpations: Abdomen is soft.  Musculoskeletal:        General: Swelling present.     Comments: Her venous ulcer Is now healed. Has scab  Skin:    General: Skin is warm and dry.  Neurological:     General: No focal deficit present.     Mental Status: She is alert.     Comments: Not oriented. Answers by Nodding or one word Syllabi   Psychiatric:        Mood and Affect: Mood normal.        Thought Content: Thought content normal.     Labs reviewed: Recent Labs    11/15/18 12/06/18 12/27/18  NA 142 141 142  K 3.4 4.6 4.3  CL 104 104 106  CO2 29 29 29   BUN 43* 18 20  CREATININE 1.2* 0.9 1.0  CALCIUM 11.0 10.9 10.4   Recent Labs    05/19/18  AST 18  ALT 16   Recent Labs    05/19/18  HGB 13.0  HCT 38  PLT 351   Lab Results  Component Value Date   TSH 1.90 06/24/2017   Lab Results  Component Value Date   HGBA1C 4.4 11/14/2015   Lab Results  Component Value Date   CHOL 176 05/15/2014   HDL 62 05/15/2014   LDLCALC 95 05/15/2014   LDLDIRECT 114.3 12/08/2007   TRIG 93 05/15/2014   CHOLHDL 2.9 CALC 12/08/2007    Significant Diagnostic Results in last 30 days:  No results found.  Assessment/Plan Acute Depression per Nurses and Family with Behavior issues Will start her on Zoloft 25 mg QD Titrate if needed Venous ulcer of right leg ([place]) Healed now Try to keep edema controlled  Essential hypertension On Cozaar only  Leg edema Controlled with Zaroxolyn and Lasix BUN stable  Moderate dementia without behavioral disturbance ([place]) - Plan:  On Aricept on Namenda to 5 mg BID  Age-related osteoporosis On Fosamax  Chronic midline low back pain Controlled on Tyelnol  urinary incontinence Continue Mybetriq    Family/ staff Communication:   Labs/tests ordered:   Total  time spent in this patient care encounter was  25_  minutes; greater than 50% of the visit spent counseling  staff, reviewing records , Labs and coordinating care for problems addressed at this encounter.

## 2019-01-10 DIAGNOSIS — Z03818 Encounter for observation for suspected exposure to other biological agents ruled out: Secondary | ICD-10-CM | POA: Diagnosis not present

## 2019-01-16 ENCOUNTER — Encounter: Payer: Self-pay | Admitting: Nurse Practitioner

## 2019-01-16 ENCOUNTER — Non-Acute Institutional Stay (SKILLED_NURSING_FACILITY): Payer: Medicare Other | Admitting: Nurse Practitioner

## 2019-01-16 DIAGNOSIS — F0280 Dementia in other diseases classified elsewhere without behavioral disturbance: Secondary | ICD-10-CM | POA: Diagnosis not present

## 2019-01-16 DIAGNOSIS — I10 Essential (primary) hypertension: Secondary | ICD-10-CM

## 2019-01-16 DIAGNOSIS — F0390 Unspecified dementia without behavioral disturbance: Secondary | ICD-10-CM | POA: Diagnosis not present

## 2019-01-16 DIAGNOSIS — K219 Gastro-esophageal reflux disease without esophagitis: Secondary | ICD-10-CM

## 2019-01-16 DIAGNOSIS — M169 Osteoarthritis of hip, unspecified: Secondary | ICD-10-CM

## 2019-01-16 DIAGNOSIS — N3281 Overactive bladder: Secondary | ICD-10-CM

## 2019-01-16 DIAGNOSIS — F339 Major depressive disorder, recurrent, unspecified: Secondary | ICD-10-CM | POA: Insufficient documentation

## 2019-01-16 DIAGNOSIS — F0393 Unspecified dementia, unspecified severity, with mood disturbance: Secondary | ICD-10-CM

## 2019-01-16 DIAGNOSIS — I739 Peripheral vascular disease, unspecified: Secondary | ICD-10-CM

## 2019-01-16 DIAGNOSIS — F329 Major depressive disorder, single episode, unspecified: Secondary | ICD-10-CM | POA: Diagnosis not present

## 2019-01-16 DIAGNOSIS — F03B0 Unspecified dementia, moderate, without behavioral disturbance, psychotic disturbance, mood disturbance, and anxiety: Secondary | ICD-10-CM

## 2019-01-16 NOTE — Assessment & Plan Note (Signed)
Continue [facility] for safety, care assistance, continue Donepezil 10mg  qd, Memantine 5mg  bid.

## 2019-01-16 NOTE — Assessment & Plan Note (Signed)
Blood pressure is controlled, continue Losartan 50mg qd.  

## 2019-01-16 NOTE — Assessment & Plan Note (Signed)
Stable, no urinary retention, continue Mirabegron 25mg  qd.

## 2019-01-16 NOTE — Assessment & Plan Note (Signed)
Chronic, trace edema BLE, continue Metolazone 2.5mg  qd, Furosemide 20mg  qd.

## 2019-01-16 NOTE — Assessment & Plan Note (Signed)
Stable, continue Famotidine 10mg qd.  

## 2019-01-16 NOTE — Assessment & Plan Note (Signed)
Lower back pain is controlled, continue Tylenol 500mg  tid.

## 2019-01-16 NOTE — Assessment & Plan Note (Signed)
Stable, continue Sertraline 25mg  qd.

## 2019-01-16 NOTE — Assessment & Plan Note (Signed)
Trending down from 10.9 12/06/18 to 10.4 12/27/18, Pharm recommended to reduce Ca/Vit 600/400 from bid to qd. Observe.

## 2019-01-16 NOTE — Progress Notes (Signed)
Location:  [facility] Room Number: 3 Place of Service:  SNF (31) Provider:  Marlana Latus  NP  Mael Delap X, NP  Patient Care Team: Alberta Cairns X, NP as PCP - General (Internal Medicine) Rasheida Broden X, NP as Nurse Practitioner (Nurse Practitioner) Virgie Dad, MD as Consulting Physician (Internal Medicine)  Extended Emergency Contact Information Primary Emergency Contact: Andrews Address: [place], [place] [address] [name] of [place] Phone: [phone] Work Phone: [phone] Mobile Phone: [phone] Relation: Daughter Secondary Emergency Contact: [name] Address: [address] [place] [address] [name] of [place] Phone: [phone] Mobile Phone: [phone] Relation: Son  Code Status:  DNR Goals of care: Advanced Directive information Advanced Directives 01/16/2019  Does Patient Have a Medical Advance Directive? Yes  Type of Paramedic of [place];Living will;Out of facility DNR (pink MOST or yellow form)  Does patient want to make changes to medical advance directive? No - Patient declined  Copy of [place] in Chart? Yes - validated most recent copy scanned in chart (See row information)  Would patient like information on creating a medical advance directive? No - Patient declined  Pre-existing out of facility DNR order (yellow form or pink MOST form) Yellow form placed in chart (order not valid for inpatient use)     Chief Complaint  Patient presents with  . Medical Management of Chronic Issues    HPI:  Pt is a 85 y.o. female seen today for medical management of chronic diseases.    The patient resides in [facility] for safety, care assistance, on Donepezil 10mg  qd, Memantine 5mg  bid for memory. Her mood is stable on Sertraline 25mg  qd. BLE edea, stable, trace, chronic, on Furosemide 20mg  qd, Metolazone 2.5mg  qd. HTN,  blood pressure is controlled, on Losartan 50mg  qd. OAB, stable, on Mirabegron 25mg  qd. GERD, stable, on Famotidine 10mg  qd. Lower back pain, stable, on Tylenol 500mg  tid.    Past Medical History:  Diagnosis Date  . Abnormality of gait 04/28/2010   History of fall onto right shoulder-has participated in PT. Uses walker.     . Acute encephalopathy 10/25/14   Occurred when septic  . Acute lower GI bleeding 10/30/2014  . Adjustment disorder with mixed anxiety and depressed mood 06/04/2015   11/15/15 Hgb 12.8, Na 139, K 4.2, Bun 14, creat 0.68, TP 5.7, albumin 3.5, TSH 1.54, Hgb a1c 4.4   . Anemia   . Aortic atherosclerosis ([place]) 10/27/2014  . Asthma   . Cerebral atrophy ([place]) 10/27/2014  . Cerebrovascular disease 10/27/2014   Small vessel disease  . Cervical disc disease   . Chronic lower back pain 06/04/2015   lumbar spondylosis. Regular f/u Dr. French Ana. Receives injections last 01/2013.  05/27/15 Xray left hip/pelvis: spondylotic changes, severe osteoarthritis Continue Tylenol and Tramadol prn for pain, observe the patient, adding Cymbalta may help. Mid to lower back pain more on the right side back, may consider X-ray of thoracic spine. 06/01/15 CT thoracic spine showed no acute changes.  Patient received 10 injection in the back through Dr. Alvester Morin office. She says the previous problems of discomfort in the back and down the leg have completely resolved. Takes Aleve 220mg  bid   . Debility 06/20/2015   11/15/15 Hgb 12.8, Na 139, K 4.2, Bun 14, creat 0.68, TP 5.7, albumin 3.5, TSH  1.54, Hgb a1c 4.4   . Degenerative disc disease, lumbar 10/27/2014  . Depression   . Dermatitis 03/25/2016  . Diarrhea 10/25/14  . Diverticulosis 10/27/2014  . Essential hypertension 09/20/2006   11/15/15 Hgb 12.8, Na 139, K 4.2, Bun 14, creat 0.68, TP 5.7, albumin 3.5, TSH 1.54, Hgb a1c 4.4   . Fall at nursing home 10/25/2014  . GERD (gastroesophageal reflux disease)   . History of cardiovascular disorder 09/20/2006   X 3 in  2000 now on aspirin.    Marland Kitchen Hyperlipidemia 09/20/2006   05/15/14 LDL 95   . Hypertension   . Lumbosacral spondylosis 10/28/2009  . OSTEOPOROSIS 09/20/2006   Noted 2008    . Overactive bladder 11/29/2013   Myrbetriq prescribed by urology.    . Protein calorie malnutrition ([place])    06/06/15 TP 6.0 albumin 2.9  . SCOLIOSIS 10/28/2009  . Scoliosis 10/27/2014  . Thrombocytosis ([place]) 05/31/2015   06/10/15 plt 602 07/23/15 plt 335 11/15/15 Hgb 12.8, Na 139, K 4.2, Bun 14, creat 0.68, TP 5.7, albumin 3.5, TSH 1.54, Hgb a1c 4.4  . TIA (transient ischemic attack) 09/20/2006   Past Surgical History:  Procedure Laterality Date  . ABDOMINAL HYSTERECTOMY    . APPENDECTOMY    . CHOLECYSTECTOMY    . TONSILLECTOMY      Allergies  Allergen Reactions  . Tramadol Other (See Comments)    Hallucinations, agitation/combativeness  . Amoxicillin     Per MAR  . Oxycodone Other (See Comments)    Per MAR   . Sulfamethoxazole     Per MAR   . Penicillins Rash    Outpatient Encounter Medications as of 01/16/2019  Medication Sig  . acetaminophen (TYLENOL) 500 MG tablet Take 500 mg by mouth 3 (three) times daily.  Marland Kitchen acetaminophen (TYLENOL) 500 MG tablet Take 500 mg by mouth every 8 (eight) hours as needed.  Marland Kitchen albuterol (PROVENTIL HFA;VENTOLIN HFA) 108 (90 BASE) MCG/ACT inhaler Inhale 2 puffs into the lungs every 6 (six) hours as needed for wheezing or shortness of breath.  Marland Kitchen alendronate (FOSAMAX) 70 MG tablet Take 70 mg by mouth once a week. Take with a full glass of water on an empty stomach. On Monday.  Marland Kitchen alum & mag hydroxide-simeth (MAALOX/MYLANTA) 200-200-20 MG/5ML suspension Take 30 mLs by mouth every 4 (four) hours as needed for indigestion or heartburn.   . calcium carbonate (OSCAL) 1500 (600 Ca) MG TABS tablet Take 600 mg of elemental calcium by mouth 2 (two) times daily with a meal.  . camphor-menthol (SARNA) lotion Apply 1 application topically as needed. To arms in am and legs at bed apply to bilateral lower  extremities every night at bedtime for itching   . Can also use as needed.  . cetaphil (CETAPHIL) cream Apply 1 application topically daily. APPLY  TO BILATERAL ARMS AND LEGS TO MOISTURIZE SKIN  . clobetasol cream (TEMOVATE) [id number] % Apply 1 application topically 2 (two) times daily as needed.  . donepezil (ARICEPT) 10 MG tablet Take 10 mg by mouth at bedtime.  . famotidine (PEPCID) 10 MG tablet Take 10 mg by mouth at bedtime.  . furosemide (LASIX) 20 MG tablet Take 20 mg by mouth daily.   Marland Kitchen lactose free nutrition (BOOST) LIQD Take 237 mLs by mouth daily.  Marland Kitchen losartan (COZAAR) 100 MG tablet Take 50 mg by mouth daily. Take 1/2 tablet to equal 50 mg.  . memantine (NAMENDA) 5 MG tablet Take 5 mg by mouth 2 (two) times daily.   Marland Kitchen  Menthol, Topical Analgesic, (BIOFREEZE) 4 % GEL Apply topically 4 (four) times daily. To lower back  . metolazone (ZAROXOLYN) 2.5 MG tablet Take 2.5 mg by mouth daily. Mon and Fri   . Mirabegron ER (MYRBETRIQ) 25 MG TB24 Take 25 mg by mouth daily with breakfast. Prescribed by urology  . nitroGLYCERIN (NITROSTAT) 0.4 MG SL tablet Place 0.4 mg under the tongue every 5 (five) minutes as needed for chest pain. After taking three times and no relief call MD.  . polyethylene glycol (MIRALAX / GLYCOLAX) packet Take 17 g by mouth daily as needed for moderate constipation.   . potassium chloride SA (K-DUR) 20 MEQ tablet Take 40 mEq by mouth daily.   Marland Kitchen pyridOXINE (VITAMIN B-6) 100 MG tablet Take 100 mg by mouth daily.  . sertraline (ZOLOFT) 25 MG tablet Take 25 mg by mouth daily.  Marland Kitchen zinc oxide 20 % ointment Apply 1 application topically as needed for irritation. To buttocks after every incontinent episode and as needed for redness. May keep at bedside   No facility-administered encounter medications on file as of 01/16/2019.    ROS was provided with assistance of staff.  Review of Systems  Constitutional: Negative for activity change, appetite change, chills, diaphoresis, fatigue and  fever.  HENT: Positive for hearing loss. Negative for congestion and voice change.   Respiratory: Negative for cough, shortness of breath and wheezing.   Cardiovascular: Positive for leg swelling. Negative for chest pain and palpitations.  Gastrointestinal: Negative for abdominal distention, abdominal pain, constipation, diarrhea, nausea and vomiting.  Genitourinary: Negative for difficulty urinating, dysuria and urgency.  Musculoskeletal: Positive for arthralgias, back pain and gait problem.  Skin: Negative for color change.  Neurological: Negative for dizziness, speech difficulty, weakness and headaches.       Dementia  Psychiatric/Behavioral: Negative for agitation, hallucinations and sleep disturbance. The patient is not nervous/anxious.     Immunization History  Administered Date(s) Administered  . HiB (PRP-OMP) 02/12/2004  . Influenza Split 01/11/2012  . Influenza Whole 01/07/2009, 01/15/2018  . Influenza-Unspecified 01/11/2014, 01/01/2015, 01/29/2016, 02/01/2017  . PPD Test 11/14/2014, 06/03/2015, 06/17/2015  . Pneumococcal Conjugate-13 02/08/2017  . Pneumococcal Polysaccharide-23 04/28/2010  . Tdap 08/24/2011  . Zoster 04/13/2010   Pertinent  Health Maintenance Due  Topic Date Due  . INFLUENZA VACCINE  11/12/2018  . DEXA SCAN  Completed  . PNA vac Low Risk Adult  Completed   Fall Risk  01/04/2018 12/29/2016 11/02/2016 06/20/2015 05/23/2015  Falls in the past year? No No Yes No No  Comment - - Emmi Telephone Survey: data to providers prior to load - -  Number falls in past yr: - - 2 or more - -  Comment - - Emmi Telephone Survey Actual Response = 90 - -  Injury with Fall? - - Yes - -   Functional Status Survey:    Vitals:   01/16/19 1618  BP: 115/60  Pulse: 62  Resp: 20  Temp: (!) 96.8 F (36 C)  SpO2: 92%  Weight: 146 lb 14.4 oz (66.6 kg)  Height: 5\' 5"  (1.651 m)   Body mass index is 24.45 kg/m. Physical Exam Vitals signs and nursing note reviewed.   Constitutional:      General: She is not in acute distress.    Appearance: Normal appearance. She is normal weight. She is not ill-appearing, toxic-appearing or diaphoretic.  HENT:     Head: Normocephalic and atraumatic.     Nose: Nose normal.     Mouth/Throat:  Mouth: Mucous membranes are moist.  Eyes:     Extraocular Movements: Extraocular movements intact.     Conjunctiva/sclera: Conjunctivae normal.     Pupils: Pupils are equal, round, and reactive to light.  Neck:     Musculoskeletal: Normal range of motion and neck supple.  Cardiovascular:     Rate and Rhythm: Normal rate and regular rhythm.     Heart sounds: No murmur.  Pulmonary:     Breath sounds: No wheezing, rhonchi or rales.  Chest:     Chest wall: No tenderness.  Abdominal:     General: Bowel sounds are normal. There is no distension.     Palpations: Abdomen is soft.     Tenderness: There is no abdominal tenderness. There is no right CVA tenderness, left CVA tenderness, guarding or rebound.  Musculoskeletal:     Right lower leg: Edema present.     Left lower leg: Edema present.     Comments: Trace edema BLE  Skin:    General: Skin is warm and dry.  Neurological:     General: No focal deficit present.     Mental Status: She is alert. Mental status is at baseline.     Motor: No weakness.     Coordination: Coordination normal.     Gait: Gait abnormal.     Comments: Oriented to self, follows simple directions.   Psychiatric:        Mood and Affect: Mood normal.        Behavior: Behavior normal.     Labs reviewed: Recent Labs    11/15/18 12/06/18 12/27/18  NA 142 141 142  K 3.4 4.6 4.3  CL 104 104 106  CO2 29 29 29   BUN 43* 18 20  CREATININE 1.2* 0.9 1.0  CALCIUM 11.0 10.9 10.4   Recent Labs    05/19/18  AST 18  ALT 16   Recent Labs    05/19/18  HGB 13.0  HCT 38  PLT 351   Lab Results  Component Value Date   TSH 1.90 06/24/2017   Lab Results  Component Value Date   HGBA1C 4.4  11/14/2015   Lab Results  Component Value Date   CHOL 176 05/15/2014   HDL 62 05/15/2014   LDLCALC 95 05/15/2014   LDLDIRECT 114.3 12/08/2007   TRIG 93 05/15/2014   CHOLHDL 2.9 CALC 12/08/2007    Significant Diagnostic Results in last 30 days:  No results found.  Assessment/Plan Essential hypertension Blood pressure is controlled, continue Losartan 50mg  qd.   PVD (peripheral vascular disease) (HCC) Chronic, trace edema BLE, continue Metolazone 2.5mg  qd, Furosemide 20mg  qd.   GERD (gastroesophageal reflux disease) Stable, continue Famotidine 10mg  qd.   Moderate dementia without behavioral disturbance ([place]) Continue SNF [facility] for safety, care assistance, continue Donepezil 10mg  qd, Memantine 5mg  bid.  Osteoarthritis (arthritis due to wear and tear of joints) Lower back pain is controlled, continue Tylenol 500mg  tid.   Overactive bladder Stable, no urinary retention, continue Mirabegron 25mg  qd.   Hypercalcemia Trending down from 10.9 12/06/18 to 10.4 12/27/18, Pharm recommended to reduce Ca/Vit 600/400 from bid to qd. Observe.    Depression due to dementia ([place]) Stable, continue Sertraline 25mg  qd.      Family/ staff Communication: plan of care reviewed with the patient and charge nurse.   Labs/tests ordered:  none  Time spend 25 minutes.

## 2019-01-17 DIAGNOSIS — Z03818 Encounter for observation for suspected exposure to other biological agents ruled out: Secondary | ICD-10-CM | POA: Diagnosis not present

## 2019-01-23 ENCOUNTER — Encounter: Payer: Self-pay | Admitting: Nurse Practitioner

## 2019-01-23 ENCOUNTER — Non-Acute Institutional Stay (SKILLED_NURSING_FACILITY): Payer: Medicare Other | Admitting: Nurse Practitioner

## 2019-01-23 DIAGNOSIS — S91209A Unspecified open wound of unspecified toe(s) with damage to nail, initial encounter: Secondary | ICD-10-CM | POA: Diagnosis not present

## 2019-01-23 DIAGNOSIS — M159 Polyosteoarthritis, unspecified: Secondary | ICD-10-CM | POA: Diagnosis not present

## 2019-01-23 DIAGNOSIS — F03B0 Unspecified dementia, moderate, without behavioral disturbance, psychotic disturbance, mood disturbance, and anxiety: Secondary | ICD-10-CM

## 2019-01-23 DIAGNOSIS — F0390 Unspecified dementia without behavioral disturbance: Secondary | ICD-10-CM | POA: Diagnosis not present

## 2019-01-23 DIAGNOSIS — R600 Localized edema: Secondary | ICD-10-CM | POA: Diagnosis not present

## 2019-01-23 NOTE — Progress Notes (Signed)
Location:   S[facility] Room Number: 3 Place of Service:  SNF (31) Provider:  Loras Grieshop X, NP  Leobardo Granlund X, NP  Patient Care Team: Wane Mollett X, NP as PCP - General (Internal Medicine) Pranay Hilbun X, NP as Nurse Practitioner (Nurse Practitioner) Virgie Dad, MD as Consulting Physician (Internal Medicine)  Extended Emergency Contact Information Primary Emergency Contact: [place] Address: [place], [place] [address] [name] of [place] Phone: [phone] Work Phone: [phone] Mobile Phone: [phone] Relation: Daughter Secondary Emergency Contact: [name] Address: [address] [place] [address] [name] of [place] Phone: [phone] Mobile Phone: [phone] Relation: Son  Code Status:  DNR Goals of care: Advanced Directive information Advanced Directives 01/23/2019  Does Patient Have a Medical Advance Directive? Yes  Type of Paramedic of [place];Living will;Out of facility DNR (pink MOST or yellow form)  Does patient want to make changes to medical advance directive? No - Patient declined  Copy of [place] in Chart? Yes - validated most recent copy scanned in chart (See row information)  Would patient like information on creating a medical advance directive? -  Pre-existing out of facility DNR order (yellow form or pink MOST form) Yellow form placed in chart (order not valid for inpatient use)     Chief Complaint  Patient presents with   Acute Visit    Left second toe nail missing    HPI:  Pt is a 85 y.o. female seen today for an acute visit for reported missing toe nail second toe on the left foot, no recollection of the event 01/22/19. No bleeding or s/s of infection upon my examination. Chronic lower back pain, stable on Tylenol 500mg  tid. She takes Donepezil 10mg  qd, Memantine 5mg  bid for memory. BLE edema trace, on  Furosemide 20mg  qd, Metolazone 2.5mg  2x/wk.    Past Medical History:  Diagnosis Date   Abnormality of gait 04/28/2010   History of fall onto right shoulder-has participated in PT. Uses walker.      Acute encephalopathy 10/25/14   Occurred when septic   Acute lower GI bleeding 10/30/2014   Adjustment disorder with mixed anxiety and depressed mood 06/04/2015   11/15/15 Hgb 12.8, Na 139, K 4.2, Bun 14, creat 0.68, TP 5.7, albumin 3.5, TSH 1.54, Hgb a1c 4.4    Anemia    Aortic atherosclerosis ([place]) 10/27/2014   Asthma    Cerebral atrophy ([place]) 10/27/2014   Cerebrovascular disease 10/27/2014   Small vessel disease   Cervical disc disease    Chronic lower back pain 06/04/2015   lumbar spondylosis. Regular f/u Dr. French Ana. Receives injections last 01/2013.  05/27/15 Xray left hip/pelvis: spondylotic changes, severe osteoarthritis Continue Tylenol and Tramadol prn for pain, observe the patient, adding Cymbalta may help. Mid to lower back pain more on the right side back, may consider X-ray of thoracic spine. 06/01/15 CT thoracic spine showed no acute changes.  Patient received 10 injection in the back through Dr. Alvester Morin office. She says the previous problems of discomfort in the back and down the leg have completely resolved. Takes Aleve 220mg  bid    Debility 06/20/2015   11/15/15 Hgb 12.8, Na 139, K 4.2, Bun 14, creat 0.68, TP 5.7, albumin 3.5, TSH 1.54, Hgb a1c 4.4    Degenerative disc disease, lumbar 10/27/2014   Depression  Dermatitis 03/25/2016   Diarrhea 10/25/14   Diverticulosis 10/27/2014   Essential hypertension 09/20/2006   11/15/15 Hgb 12.8, Na 139, K 4.2, Bun 14, creat 0.68, TP 5.7, albumin 3.5, TSH 1.54, Hgb a1c 4.4    Fall at nursing home 10/25/2014   GERD (gastroesophageal reflux disease)    History of cardiovascular disorder 09/20/2006   X 3 in 2000 now on aspirin.     Hyperlipidemia 09/20/2006   05/15/14 LDL 95    Hypertension    Lumbosacral spondylosis 10/28/2009     OSTEOPOROSIS 09/20/2006   Noted 2008     Overactive bladder 11/29/2013   Myrbetriq prescribed by urology.     Protein calorie malnutrition ([place])    06/06/15 TP 6.0 albumin 2.9   SCOLIOSIS 10/28/2009   Scoliosis 10/27/2014   Thrombocytosis ([place]) 05/31/2015   06/10/15 plt 602 07/23/15 plt 335 11/15/15 Hgb 12.8, Na 139, K 4.2, Bun 14, creat 0.68, TP 5.7, albumin 3.5, TSH 1.54, Hgb a1c 4.4   TIA (transient ischemic attack) 09/20/2006   Past Surgical History:  Procedure Laterality Date   ABDOMINAL HYSTERECTOMY     APPENDECTOMY     CHOLECYSTECTOMY     TONSILLECTOMY      Allergies  Allergen Reactions   Tramadol Other (See Comments)    Hallucinations, agitation/combativeness   Amoxicillin     Per MAR   Oxycodone Other (See Comments)    Per MAR    Sulfamethoxazole     Per MAR    Penicillins Rash    Allergies as of 01/23/2019      Reactions   Tramadol Other (See Comments)   Hallucinations, agitation/combativeness   Amoxicillin    Per MAR   Oxycodone Other (See Comments)   Per MAR    Sulfamethoxazole    Per MAR    Penicillins Rash      Medication List       Accurate as of January 23, 2019  1:55 PM. If you have any questions, ask your nurse or doctor.        acetaminophen 500 MG tablet Commonly known as: TYLENOL Take 500 mg by mouth 3 (three) times daily.   acetaminophen 500 MG tablet Commonly known as: TYLENOL Take 500 mg by mouth every 8 (eight) hours as needed.   albuterol 108 (90 Base) MCG/ACT inhaler Commonly known as: VENTOLIN HFA Inhale 2 puffs into the lungs every 6 (six) hours as needed for wheezing or shortness of breath.   alendronate 70 MG tablet Commonly known as: FOSAMAX Take 70 mg by mouth once a week. Take with a full glass of water on an empty stomach. On Monday.   alum & mag hydroxide-simeth 200-200-20 MG/5ML suspension Commonly known as: MAALOX/MYLANTA Take 30 mLs by mouth every 4 (four) hours as needed for indigestion or  heartburn.   Biofreeze 4 % Gel Generic drug: Menthol (Topical Analgesic) Apply topically 4 (four) times daily. To lower back   calcium carbonate 1500 (600 Ca) MG Tabs tablet Commonly known as: OSCAL Take 600 mg of elemental calcium by mouth daily.   camphor-menthol lotion Commonly known as: SARNA Apply 1 application topically as needed. To arms in am and legs at bed apply to bilateral lower extremities every night at bedtime for itching   . Can also use as needed.   cetaphil cream Apply 1 application topically daily. APPLY  TO BILATERAL ARMS AND LEGS TO MOISTURIZE SKIN   clobetasol cream 0.05 % Commonly known as: TEMOVATE Apply 1 application topically 2 (  two) times daily as needed.   donepezil 10 MG tablet Commonly known as: ARICEPT Take 10 mg by mouth at bedtime.   famotidine 10 MG tablet Commonly known as: PEPCID Take 10 mg by mouth at bedtime.   furosemide 20 MG tablet Commonly known as: LASIX Take 20 mg by mouth daily.   lactose free nutrition Liqd Take 237 mLs by mouth daily.   losartan 100 MG tablet Commonly known as: COZAAR Take 50 mg by mouth daily. Take 1/2 tablet to equal 50 mg.   memantine 5 MG tablet Commonly known as: NAMENDA Take 5 mg by mouth 2 (two) times daily.   metolazone 2.5 MG tablet Commonly known as: ZAROXOLYN Take 2.5 mg by mouth daily. Mon and Fri   Myrbetriq 25 MG Tb24 tablet Generic drug: mirabegron ER Take 25 mg by mouth daily with breakfast. Prescribed by urology   nitroGLYCERIN 0.4 MG SL tablet Commonly known as: NITROSTAT Place 0.4 mg under the tongue every 5 (five) minutes as needed for chest pain. After taking three times and no relief call MD.   polyethylene glycol 17 g packet Commonly known as: MIRALAX / GLYCOLAX Take 17 g by mouth daily as needed for moderate constipation.   potassium chloride SA 20 MEQ tablet Commonly known as: KLOR-CON Take 40 mEq by mouth daily.   pyridOXINE 100 MG tablet Commonly known as:  VITAMIN B-6 Take 100 mg by mouth daily.   sertraline 25 MG tablet Commonly known as: ZOLOFT Take 25 mg by mouth daily.   zinc oxide 20 % ointment Apply 1 application topically as needed for irritation. To buttocks after every incontinent episode and as needed for redness. May keep at bedside      ROS was provided with assistance of staff.  Review of Systems  Constitutional: Negative for activity change, appetite change, chills, diaphoresis, fatigue and fever.  HENT: Positive for hearing loss. Negative for congestion and voice change.   Respiratory: Negative for cough, shortness of breath and wheezing.   Cardiovascular: Positive for leg swelling. Negative for chest pain and palpitations.  Gastrointestinal: Negative for abdominal distention, abdominal pain, constipation, diarrhea, nausea and vomiting.  Genitourinary: Negative for difficulty urinating, dysuria and urgency.  Musculoskeletal: Positive for arthralgias, back pain and gait problem.  Skin: Negative for color change and pallor.       Left 2nd toe nail missing.   Neurological: Negative for dizziness, speech difficulty, weakness and headaches.       Dementia  Psychiatric/Behavioral: Negative for agitation, behavioral problems, hallucinations and sleep disturbance. The patient is not nervous/anxious.     Immunization History  Administered Date(s) Administered   HiB (PRP-OMP) 02/12/2004   Influenza Split 01/11/2012   Influenza Whole 01/07/2009, 01/15/2018   Influenza-Unspecified 01/11/2014, 01/01/2015, 01/29/2016, 02/01/2017   PPD Test 11/14/2014, 06/03/2015, 06/17/2015   Pneumococcal Conjugate-13 02/08/2017   Pneumococcal Polysaccharide-23 04/28/2010   Tdap 08/24/2011   Zoster 04/13/2010   Pertinent  Health Maintenance Due  Topic Date Due   INFLUENZA VACCINE  11/12/2018   DEXA SCAN  Completed   PNA vac Low Risk Adult  Completed   Fall Risk  01/04/2018 12/29/2016 11/02/2016 06/20/2015 05/23/2015  Falls in the  past year? No No Yes No No  Comment - - Emmi Telephone Survey: data to providers prior to load - -  Number falls in past yr: - - 2 or more - -  Comment - - Emmi Telephone Survey Actual Response = 90 - -  Injury with Fall? - -  Yes - -   Functional Status Survey:    Vitals:   01/23/19 1305  BP: 115/60  Pulse: 62  Resp: 20  Temp: (!) 97.3 F (36.3 C)  SpO2: 93%  Weight: 146 lb 14.4 oz (66.6 kg)  Height: 5\' 5"  (1.651 m)   Body mass index is 24.45 kg/m. Physical Exam Vitals signs and nursing note reviewed.  Constitutional:      General: She is not in acute distress.    Appearance: Normal appearance. She is not ill-appearing, toxic-appearing or diaphoretic.  HENT:     Head: Normocephalic and atraumatic.     Nose: Nose normal.     Mouth/Throat:     Mouth: Mucous membranes are moist.  Eyes:     General:        Right eye: No discharge.        Left eye: No discharge.     Extraocular Movements: Extraocular movements intact.     Conjunctiva/sclera: Conjunctivae normal.     Pupils: Pupils are equal, round, and reactive to light.  Neck:     Musculoskeletal: Normal range of motion and neck supple.  Cardiovascular:     Rate and Rhythm: Normal rate and regular rhythm.     Heart sounds: No murmur.  Pulmonary:     Breath sounds: Rhonchi present. No wheezing or rales.  Abdominal:     General: Bowel sounds are normal. There is no distension.     Palpations: Abdomen is soft.     Tenderness: There is no abdominal tenderness. There is no right CVA tenderness, left CVA tenderness, guarding or rebound.  Musculoskeletal:     Right lower leg: No edema.     Left lower leg: No edema.     Comments: Trace edema BLE  Skin:    General: Skin is warm and dry.     Comments: Left 2nd toe nail traumatic missing-reported the patient was holding tissue socked with blood to second toe of the left foot 01/22/19.   Neurological:     General: No focal deficit present.     Mental Status: She is alert.  Mental status is at baseline.     Cranial Nerves: No cranial nerve deficit.     Motor: No weakness.     Coordination: Coordination normal.     Gait: Gait abnormal.     Comments: Oriented to person, place.   Psychiatric:        Mood and Affect: Mood normal.        Behavior: Behavior normal.     Labs reviewed: Recent Labs    11/15/18 12/06/18 12/27/18  NA 142 141 142  K 3.4 4.6 4.3  CL 104 104 106  CO2 29 29 29   BUN 43* 18 20  CREATININE 1.2* 0.9 1.0  CALCIUM 11.0 10.9 10.4   Recent Labs    05/19/18  AST 18  ALT 16   Recent Labs    05/19/18  HGB 13.0  HCT 38  PLT 351   Lab Results  Component Value Date   TSH 1.90 06/24/2017   Lab Results  Component Value Date   HGBA1C 4.4 11/14/2015   Lab Results  Component Value Date   CHOL 176 05/15/2014   HDL 62 05/15/2014   LDLCALC 95 05/15/2014   LDLDIRECT 114.3 12/08/2007   TRIG 93 05/15/2014   CHOLHDL 2.9 CALC 12/08/2007    Significant Diagnostic Results in last 30 days:  No results found.  Assessment/Plan Traumatic avulsion of nail plate  of toe The left 2nd toe nail, no bleeding or s/s of infection, it should heal.   Moderate dementia without behavioral disturbance ([place]) Continue SN[facility] for safety, care assistance, continue Donepezil 10mg  qd, Memantine 5mg  bid for memory.   Osteoarthritis (arthritis due to wear and tear of joints) Chronic lower back pain, stable, continue Tylenol 500mg  tid.   Leg edema Trace edema BLE, continue Furosemide 20mg  qd, Metolazone 2.5mg  2x/wk.      Family/ staff Communication: plan of care reviewed with the patient and charge nurse.   Labs/tests ordered:  none  Time spend 25 minutes.

## 2019-01-23 NOTE — Assessment & Plan Note (Signed)
The left 2nd toe nail, no bleeding or s/s of infection, it should heal.

## 2019-01-23 NOTE — Assessment & Plan Note (Addendum)
Trace edema BLE , continue Furosemide 20mg qd, Metolazone 2.5mg 2x/wk.  

## 2019-01-23 NOTE — Assessment & Plan Note (Signed)
Chronic lower back pain, stable, continue Tylenol 500mg  tid.

## 2019-01-23 NOTE — Assessment & Plan Note (Signed)
Continue SN[facility] for safety, care assistance, continue Donepezil 10mg  qd, Memantine 5mg  bid for memory.

## 2019-02-07 DIAGNOSIS — Z03818 Encounter for observation for suspected exposure to other biological agents ruled out: Secondary | ICD-10-CM | POA: Diagnosis not present

## 2019-02-09 ENCOUNTER — Non-Acute Institutional Stay (SKILLED_NURSING_FACILITY): Payer: Medicare Other | Admitting: Internal Medicine

## 2019-02-09 DIAGNOSIS — I739 Peripheral vascular disease, unspecified: Secondary | ICD-10-CM

## 2019-02-09 DIAGNOSIS — N3281 Overactive bladder: Secondary | ICD-10-CM | POA: Diagnosis not present

## 2019-02-09 DIAGNOSIS — F0390 Unspecified dementia without behavioral disturbance: Secondary | ICD-10-CM | POA: Diagnosis not present

## 2019-02-09 DIAGNOSIS — F03B0 Unspecified dementia, moderate, without behavioral disturbance, psychotic disturbance, mood disturbance, and anxiety: Secondary | ICD-10-CM

## 2019-02-09 DIAGNOSIS — R600 Localized edema: Secondary | ICD-10-CM

## 2019-02-09 DIAGNOSIS — I10 Essential (primary) hypertension: Secondary | ICD-10-CM

## 2019-02-09 DIAGNOSIS — F0280 Dementia in other diseases classified elsewhere without behavioral disturbance: Secondary | ICD-10-CM | POA: Diagnosis not present

## 2019-02-09 DIAGNOSIS — F0393 Unspecified dementia, unspecified severity, with mood disturbance: Secondary | ICD-10-CM

## 2019-02-09 DIAGNOSIS — F329 Major depressive disorder, single episode, unspecified: Secondary | ICD-10-CM | POA: Diagnosis not present

## 2019-02-09 NOTE — Progress Notes (Signed)
Location:  [facility] manager of Service:  SNF (31)  Provider:   Code Status:  Goals of Care:  Advanced Directives 01/23/2019  Does Patient Have a Medical Advance Directive? Yes  Type of Paramedic of [place];Living will;Out of facility DNR (pink MOST or yellow form)  Does patient want to make changes to medical advance directive? No - Patient declined  Copy of [place] in Chart? Yes - validated most recent copy scanned in chart (See row information)  Would patient like information on creating a medical advance directive? -  Pre-existing out of facility DNR order (yellow form or pink MOST form) Yellow form placed in chart (order not valid for inpatient use)     Chief Complaint  Patient presents with  . Medical Management of Chronic Issues    HPI: Patient is a 85 y.o. female seen today for medical management of chronic diseases.   Patient has h/o Hypertension, Bilateral LE edema, Lower Back Pain, Osteoporosis urinary Incontinence and Dementia  Patient is stable in Facility. No New Issues. Her behavior has improved since been on Zoloft Weight stable No New issues.   Past Medical History:  Diagnosis Date  . Abnormality of gait 04/28/2010   History of fall onto right shoulder-has participated in PT. Uses walker.     . Acute encephalopathy 10/25/14   Occurred when septic  . Acute lower GI bleeding 10/30/2014  . Adjustment disorder with mixed anxiety and depressed mood 06/04/2015   11/15/15 Hgb 12.8, Na 139, K 4.2, Bun 14, creat 0.68, TP 5.7, albumin 3.5, TSH 1.54, Hgb a1c 4.4   . Anemia   . Aortic atherosclerosis ([place]) 10/27/2014  . Asthma   . Cerebral atrophy ([place]) 10/27/2014  . Cerebrovascular disease 10/27/2014   Small vessel disease  . Cervical disc disease   . Chronic lower back pain 06/04/2015   lumbar spondylosis. Regular f/u Dr. French Ana. Receives injections last 01/2013.  05/27/15 Xray left hip/pelvis: spondylotic  changes, severe osteoarthritis Continue Tylenol and Tramadol prn for pain, observe the patient, adding Cymbalta may help. Mid to lower back pain more on the right side back, may consider X-ray of thoracic spine. 06/01/15 CT thoracic spine showed no acute changes.  Patient received 10 injection in the back through Dr. Alvester Morin office. She says the previous problems of discomfort in the back and down the leg have completely resolved. Takes Aleve 220mg  bid   . Debility 06/20/2015   11/15/15 Hgb 12.8, Na 139, K 4.2, Bun 14, creat 0.68, TP 5.7, albumin 3.5, TSH 1.54, Hgb a1c 4.4   . Degenerative disc disease, lumbar 10/27/2014  . Depression   . Dermatitis 03/25/2016  . Diarrhea 10/25/14  . Diverticulosis 10/27/2014  . Essential hypertension 09/20/2006   11/15/15 Hgb 12.8, Na 139, K 4.2, Bun 14, creat 0.68, TP 5.7, albumin 3.5, TSH 1.54, Hgb a1c 4.4   . Fall at nursing home 10/25/2014  . GERD (gastroesophageal reflux disease)   . History of cardiovascular disorder 09/20/2006   X 3 in 2000 now on aspirin.    Marland Kitchen Hyperlipidemia 09/20/2006   05/15/14 LDL 95   . Hypertension   . Lumbosacral spondylosis 10/28/2009  . OSTEOPOROSIS 09/20/2006   Noted 2008    . Overactive bladder 11/29/2013   Myrbetriq prescribed by urology.    . Protein calorie malnutrition ([place])    06/06/15 TP 6.0 albumin 2.9  . SCOLIOSIS 10/28/2009  . Scoliosis 10/27/2014  . Thrombocytosis ([place]) 05/31/2015  06/10/15 plt 602 07/23/15 plt 335 11/15/15 Hgb 12.8, Na 139, K 4.2, Bun 14, creat 0.68, TP 5.7, albumin 3.5, TSH 1.54, Hgb a1c 4.4  . TIA (transient ischemic attack) 09/20/2006    Past Surgical History:  Procedure Laterality Date  . ABDOMINAL HYSTERECTOMY    . APPENDECTOMY    . CHOLECYSTECTOMY    . TONSILLECTOMY      Allergies  Allergen Reactions  . Tramadol Other (See Comments)    Hallucinations, agitation/combativeness  . Amoxicillin     Per MAR  . Oxycodone Other (See Comments)    Per MAR   . Sulfamethoxazole     Per MAR   .  Penicillins Rash    Outpatient Encounter Medications as of 02/09/2019  Medication Sig  . acetaminophen (TYLENOL) 500 MG tablet Take 500 mg by mouth 3 (three) times daily.  Marland Kitchen acetaminophen (TYLENOL) 500 MG tablet Take 500 mg by mouth every 8 (eight) hours as needed.  Marland Kitchen albuterol (PROVENTIL HFA;VENTOLIN HFA) 108 (90 BASE) MCG/ACT inhaler Inhale 2 puffs into the lungs every 6 (six) hours as needed for wheezing or shortness of breath.  Marland Kitchen alendronate (FOSAMAX) 70 MG tablet Take 70 mg by mouth once a week. Take with a full glass of water on an empty stomach. On Monday.  Marland Kitchen alum & mag hydroxide-simeth (MAALOX/MYLANTA) 200-200-20 MG/5ML suspension Take 30 mLs by mouth every 4 (four) hours as needed for indigestion or heartburn.   . calcium carbonate (OSCAL) 1500 (600 Ca) MG TABS tablet Take 600 mg of elemental calcium by mouth daily.   . camphor-menthol (SARNA) lotion Apply 1 application topically as needed. To arms in am and legs at bed apply to bilateral lower extremities every night at bedtime for itching   . Can also use as needed.  . cetaphil (CETAPHIL) cream Apply 1 application topically daily. APPLY  TO BILATERAL ARMS AND LEGS TO MOISTURIZE SKIN  . clobetasol cream (TEMOVATE) [id number] % Apply 1 application topically 2 (two) times daily as needed.  . donepezil (ARICEPT) 10 MG tablet Take 10 mg by mouth at bedtime.  . famotidine (PEPCID) 10 MG tablet Take 10 mg by mouth at bedtime.  . furosemide (LASIX) 20 MG tablet Take 20 mg by mouth daily.   Marland Kitchen lactose free nutrition (BOOST) LIQD Take 237 mLs by mouth daily.  Marland Kitchen losartan (COZAAR) 100 MG tablet Take 50 mg by mouth daily. Take 1/2 tablet to equal 50 mg.  . memantine (NAMENDA) 5 MG tablet Take 5 mg by mouth 2 (two) times daily.   . Menthol, Topical Analgesic, (BIOFREEZE) 4 % GEL Apply topically 4 (four) times daily. To lower back  . metolazone (ZAROXOLYN) 2.5 MG tablet Take 2.5 mg by mouth daily. Mon and Fri   . Mirabegron ER (MYRBETRIQ) 25 MG TB24 Take  25 mg by mouth daily with breakfast. Prescribed by urology  . nitroGLYCERIN (NITROSTAT) 0.4 MG SL tablet Place 0.4 mg under the tongue every 5 (five) minutes as needed for chest pain. After taking three times and no relief call MD.  . polyethylene glycol (MIRALAX / GLYCOLAX) packet Take 17 g by mouth daily as needed for moderate constipation.   . potassium chloride SA (K-DUR) 20 MEQ tablet Take 40 mEq by mouth daily.   Marland Kitchen pyridOXINE (VITAMIN B-6) 100 MG tablet Take 100 mg by mouth daily.  . sertraline (ZOLOFT) 25 MG tablet Take 25 mg by mouth daily.  Marland Kitchen zinc oxide 20 % ointment Apply 1 application topically as needed for irritation.  To buttocks after every incontinent episode and as needed for redness. May keep at bedside   No facility-administered encounter medications on file as of 02/09/2019.     Review of Systems:  Review of Systems  Constitutional: Negative.   HENT: Negative.   Respiratory: Negative.   Cardiovascular: Positive for leg swelling.  Gastrointestinal: Negative.   Musculoskeletal: Positive for back pain.  Psychiatric/Behavioral: Positive for confusion and dysphoric mood.  All other systems reviewed and are negative.   Health Maintenance  Topic Date Due  . INFLUENZA VACCINE  11/12/2018  . TETANUS/TDAP  08/23/2021  . DEXA SCAN  Completed  . PNA vac Low Risk Adult  Completed    Physical Exam: Vitals:   02/11/19 2110  BP: 136/72  Pulse: 72  Resp: 20  Temp: 97.8 F (36.6 C)  Weight: 147 lb (66.7 kg)   Body mass index is 24.46 kg/m. Physical Exam Vitals signs reviewed.  Constitutional:      Appearance: Normal appearance.  HENT:     Head: Normocephalic.     Nose: Nose normal.     Mouth/Throat:     Mouth: Mucous membranes are moist.     Pharynx: Oropharynx is clear.  Eyes:     Pupils: Pupils are equal, round, and reactive to light.  Neck:     Musculoskeletal: Neck supple.  Cardiovascular:     Rate and Rhythm: Normal rate and regular rhythm.      Pulses: Normal pulses.  Pulmonary:     Effort: Pulmonary effort is normal. No respiratory distress.  Abdominal:     General: Abdomen is flat. Bowel sounds are normal.     Palpations: Abdomen is soft.  Musculoskeletal:        General: Swelling present.  Skin:    General: Skin is warm and dry.  Neurological:     General: No focal deficit present.     Mental Status: She is alert.  Psychiatric:        Mood and Affect: Mood normal.        Thought Content: Thought content normal.     Labs reviewed: Basic Metabolic Panel: Recent Labs    11/15/18 12/06/18 12/27/18  NA 142 141 142  K 3.4 4.6 4.3  CL 104 104 106  CO2 29 29 29   BUN 43* 18 20  CREATININE 1.2* 0.9 1.0  CALCIUM 11.0 10.9 10.4   Liver Function Tests: Recent Labs    05/19/18  AST 18  ALT 16   No results for input(s): LIPASE, AMYLASE in the last 8760 hours. No results for input(s): AMMONIA in the last 8760 hours. CBC: Recent Labs    05/19/18  HGB 13.0  HCT 38  PLT 351   Lipid Panel: No results for input(s): CHOL, HDL, LDLCALC, TRIG, CHOLHDL, LDLDIRECT in the last 8760 hours. Lab Results  Component Value Date   HGBA1C 4.4 11/14/2015    Procedures since last visit: No results found.  Assessment/Plan  Essential hypertension - Plan:  Amlodipine was discontinued before BP is stable now   Leg edema - Plan:  Edema better on Metalazone and Lasix CKD (chronic kidney disease) stage 3, GFR 30-59 ml/min ([facility]) - Plan: BUN and Creat stable Moderate dementia without behavioral disturbance ([place]) - Plan:  On Aricept and Namenda Continue Same dose  Age-related osteoporosis On Fosamax since 12/2016 Has been of and On on Boniva  before Will repeat DEXA and if it is good will consider stopping Fosmax  Chronic midline low back pain  Controlled on Tyelnol  Urinary incontinence Continue Mybetriq  Depression Mood stable on Zoloft Will not change the dose now Labs/tests ordered:  * No order type  specified * Next appt:  Visit date not found  Total time spent in this patient care encounter was  25_  minutes; greater than 50% of the visit spent counseling patient and staff, reviewing records , Labs and coordinating care for problems addressed at this encounter.

## 2019-02-11 ENCOUNTER — Encounter: Payer: Self-pay | Admitting: Internal Medicine

## 2019-02-14 ENCOUNTER — Encounter: Payer: Self-pay | Admitting: Internal Medicine

## 2019-02-14 DIAGNOSIS — Z20828 Contact with and (suspected) exposure to other viral communicable diseases: Secondary | ICD-10-CM | POA: Diagnosis not present

## 2019-02-14 NOTE — Progress Notes (Signed)
This encounter was created in error - please disregard.

## 2019-02-21 DIAGNOSIS — Z03818 Encounter for observation for suspected exposure to other biological agents ruled out: Secondary | ICD-10-CM | POA: Diagnosis not present

## 2019-02-27 ENCOUNTER — Non-Acute Institutional Stay (SKILLED_NURSING_FACILITY): Payer: Medicare Other | Admitting: Nurse Practitioner

## 2019-02-27 ENCOUNTER — Encounter: Payer: Self-pay | Admitting: Nurse Practitioner

## 2019-02-27 DIAGNOSIS — F0393 Unspecified dementia, unspecified severity, with mood disturbance: Secondary | ICD-10-CM

## 2019-02-27 DIAGNOSIS — F0390 Unspecified dementia without behavioral disturbance: Secondary | ICD-10-CM

## 2019-02-27 DIAGNOSIS — F0280 Dementia in other diseases classified elsewhere without behavioral disturbance: Secondary | ICD-10-CM | POA: Diagnosis not present

## 2019-02-27 DIAGNOSIS — I739 Peripheral vascular disease, unspecified: Secondary | ICD-10-CM | POA: Diagnosis not present

## 2019-02-27 DIAGNOSIS — M159 Polyosteoarthritis, unspecified: Secondary | ICD-10-CM

## 2019-02-27 DIAGNOSIS — I10 Essential (primary) hypertension: Secondary | ICD-10-CM

## 2019-02-27 DIAGNOSIS — F03B0 Unspecified dementia, moderate, without behavioral disturbance, psychotic disturbance, mood disturbance, and anxiety: Secondary | ICD-10-CM

## 2019-02-27 DIAGNOSIS — F329 Major depressive disorder, single episode, unspecified: Secondary | ICD-10-CM | POA: Diagnosis not present

## 2019-02-27 NOTE — Assessment & Plan Note (Signed)
C/o lower back pain, continue Tylenol 500mg  tid, w/c for mobility.

## 2019-02-27 NOTE — Assessment & Plan Note (Signed)
Trace edema BLE, continue Metolazone 2.5mg  2x/wk, daily Furosemide 20mg  qd.

## 2019-02-27 NOTE — Assessment & Plan Note (Signed)
Progressing, the patient's daughter reported the patient did not recognize her last time she visited her mother. Continue Memantine, Donepezil, continue [facility] for safety, care assistance.

## 2019-02-27 NOTE — Assessment & Plan Note (Signed)
Blood pressure is controlled, continue Losartan 50mg qd.  

## 2019-02-27 NOTE — Assessment & Plan Note (Signed)
Sad facial looks is apparent, reported increased aggressive behaviors and depression. Will increase Sertraline 50mg  qd, update CBC/diff, CMP/eGR, TSH

## 2019-02-27 NOTE — Progress Notes (Signed)
Location:   [facility] Room Number: 3 Place of Service:  SNF (31) Provider: Rainah Kirshner X, NP  Lovella Hardie X, NP  Patient Care Team: Michelyn Scullin X, NP as PCP - General (Internal Medicine) Krithika Tome X, NP as Nurse Practitioner (Nurse Practitioner) Virgie Dad, MD as Consulting Physician (Internal Medicine)  Extended Emergency Contact Information Primary Emergency Contact: [place] Address: [place], [place] [address] [name] of [place] Phone: [phone] Work Phone: [phone] Mobile Phone: [phone] Relation: Daughter Secondary Emergency Contact: [name] Address: [address] [place] [address] [name] of [place] Phone: [phone] Mobile Phone: [phone] Relation: Son  Code Status:  DNR Goals of care: Advanced Directive information Advanced Directives 02/14/2019  Does Patient Have a Medical Advance Directive? Yes  Type of Advance Directive Out of facility DNR (pink MOST or yellow form);Living will;Healthcare Power of Attorney  Does patient want to make changes to medical advance directive? No - Patient declined  Copy of [place] in Chart? Yes - validated most recent copy scanned in chart (See row information)  Would patient like information on creating a medical advance directive? -  Pre-existing out of facility DNR order (yellow form or pink MOST form) Yellow form placed in chart (order not valid for inpatient use)     Chief Complaint  Patient presents with   Acute Visit    Daughter request medication review for mood and aggresive behavior concerns     HPI:  Pt is a 85 y.o. female seen today for an acute visit for the patient's increasing aggressive behaviors and depression. The patient's daughter stated her mother's mood did improved after the antidepressant was started and inquired if different dosage or medication might help her mother's mood. On  Sertraline 61m qd, Memantine 526mbid, Donepezil 1018md. Chronic edema BLE, compensated, on Metolazone 2.5mg40m 2x/wk,  Furosemide 20mg63m HTN, blood pressure is controlled on Losartan 50mg 41mLower back pain, stable, on Tylenol 500mg t28mnd w/c.    Past Medical History:  Diagnosis Date   Abnormality of gait 04/28/2010   History of fall onto right shoulder-has participated in PT. Uses walker.      Acute encephalopathy 10/25/14   Occurred when septic   Acute lower GI bleeding 10/30/2014   Adjustment disorder with mixed anxiety and depressed mood 06/04/2015   11/15/15 Hgb 12.8, Na 139, K 4.2, Bun 14, creat 0.68, TP 5.7, albumin 3.5, TSH 1.54, Hgb a1c 4.4    Anemia    Aortic atherosclerosis (HCC) 07[place]/2016   Asthma    Cerebral atrophy (HCC) 07[place]/2016   Cerebrovascular disease 10/27/2014   Small vessel disease   Cervical disc disease    Chronic lower back pain 06/04/2015   lumbar spondylosis. Regular f/u Dr. CaffreyFrench Anaves injections last 01/2013.  05/27/15 Xray left hip/pelvis: spondylotic changes, severe osteoarthritis Continue Tylenol and Tramadol prn for pain, observe the patient, adding Cymbalta may help. Mid to lower back pain more on the right side back, may consider X-ray of thoracic spine. 06/01/15 CT thoracic spine showed no acute changes.  Patient received 10 injection in the back through Dr. CaffreyAlvester Morin. She says the previous problems of discomfort in the back and down the leg have completely resolved. Takes Aleve 220mg bi31m Debility 06/20/2015   11/15/15 Hgb 12.8, Na 139, K  4.2, Bun 14, creat 0.68, TP 5.7, albumin 3.5, TSH 1.54, Hgb a1c 4.4    Degenerative disc disease, lumbar 10/27/2014   Depression    Dermatitis 03/25/2016   Diarrhea 10/25/14   Diverticulosis 10/27/2014   Essential hypertension 09/20/2006   11/15/15 Hgb 12.8, Na 139, K 4.2, Bun 14, creat 0.68, TP 5.7, albumin 3.5, TSH 1.54, Hgb a1c 4.4    Fall at nursing home 10/25/2014   GERD  (gastroesophageal reflux disease)    History of cardiovascular disorder 09/20/2006   X 3 in 2000 now on aspirin.     Hyperlipidemia 09/20/2006   05/15/14 LDL 95    Hypertension    Lumbosacral spondylosis 10/28/2009   OSTEOPOROSIS 09/20/2006   Noted 2008     Overactive bladder 11/29/2013   Myrbetriq prescribed by urology.     Protein calorie malnutrition ([place])    06/06/15 TP 6.0 albumin 2.9   SCOLIOSIS 10/28/2009   Scoliosis 10/27/2014   Thrombocytosis ([place]) 05/31/2015   06/10/15 plt 602 07/23/15 plt 335 11/15/15 Hgb 12.8, Na 139, K 4.2, Bun 14, creat 0.68, TP 5.7, albumin 3.5, TSH 1.54, Hgb a1c 4.4   TIA (transient ischemic attack) 09/20/2006   Past Surgical History:  Procedure Laterality Date   ABDOMINAL HYSTERECTOMY     APPENDECTOMY     CHOLECYSTECTOMY     TONSILLECTOMY      Allergies  Allergen Reactions   Tramadol Other (See Comments)    Hallucinations, agitation/combativeness   Amoxicillin     Per MAR   Oxycodone Other (See Comments)    Per MAR    Sulfamethoxazole     Per MAR    Penicillins Rash    Allergies as of 02/27/2019      Reactions   Tramadol Other (See Comments)   Hallucinations, agitation/combativeness   Amoxicillin    Per MAR   Oxycodone Other (See Comments)   Per MAR    Sulfamethoxazole    Per MAR    Penicillins Rash      Medication List       Accurate as of February 27, 2019  3:10 PM. If you have any questions, ask your nurse or doctor.        acetaminophen 500 MG tablet Commonly known as: TYLENOL Take 500 mg by mouth 3 (three) times daily.   acetaminophen 500 MG tablet Commonly known as: TYLENOL Take 500 mg by mouth every 8 (eight) hours as needed.   albuterol 108 (90 Base) MCG/ACT inhaler Commonly known as: VENTOLIN HFA Inhale 2 puffs into the lungs every 6 (six) hours as needed for wheezing or shortness of breath.   alendronate 70 MG tablet Commonly known as: FOSAMAX Take 70 mg by mouth once a week. Take with a full glass  of water on an empty stomach. On Monday.   alum & mag hydroxide-simeth 200-200-20 MG/5ML suspension Commonly known as: MAALOX/MYLANTA Take 30 mLs by mouth every 4 (four) hours as needed for indigestion or heartburn.   Biofreeze 4 % Gel Generic drug: Menthol (Topical Analgesic) Apply topically 4 (four) times daily. To lower back   calcium carbonate 1500 (600 Ca) MG Tabs tablet Commonly known as: OSCAL Take 600 mg of elemental calcium by mouth daily.   camphor-menthol lotion Commonly known as: SARNA Apply 1 application topically as needed. To arms in am and legs at bed apply to bilateral lower extremities every night at bedtime for itching   . Can also use as needed.   cetaphil cream Apply 1 application  topically daily. APPLY  TO BILATERAL ARMS AND LEGS TO MOISTURIZE SKIN   clobetasol cream 0.05 % Commonly known as: TEMOVATE Apply 1 application topically 2 (two) times daily as needed.   donepezil 10 MG tablet Commonly known as: ARICEPT Take 10 mg by mouth at bedtime.   famotidine 10 MG tablet Commonly known as: PEPCID Take 10 mg by mouth at bedtime.   furosemide 20 MG tablet Commonly known as: LASIX Take 20 mg by mouth daily.   hydrocortisone 1 % lotion Apply 1 application topically. Once a day   lactose free nutrition Liqd Take 237 mLs by mouth daily.   losartan 100 MG tablet Commonly known as: COZAAR Take 50 mg by mouth daily. Take 1/2 tablet to equal 50 mg.   memantine 5 MG tablet Commonly known as: NAMENDA Take 5 mg by mouth 2 (two) times daily.   metolazone 2.5 MG tablet Commonly known as: ZAROXOLYN Take 2.5 mg by mouth daily. Mon and Fri   Myrbetriq 25 MG Tb24 tablet Generic drug: mirabegron ER Take 25 mg by mouth daily with breakfast. Prescribed by urology   nitroGLYCERIN 0.4 MG SL tablet Commonly known as: NITROSTAT Place 0.4 mg under the tongue every 5 (five) minutes as needed for chest pain. After taking three times and no relief call MD.     polyethylene glycol 17 g packet Commonly known as: MIRALAX / GLYCOLAX Take 17 g by mouth daily as needed for moderate constipation.   potassium chloride SA 20 MEQ tablet Commonly known as: KLOR-CON Take 40 mEq by mouth daily.   pyridOXINE 100 MG tablet Commonly known as: VITAMIN B-6 Take 100 mg by mouth daily.   sertraline 25 MG tablet Commonly known as: ZOLOFT Take 25 mg by mouth daily.   zinc oxide 20 % ointment Apply 1 application topically as needed for irritation. To buttocks after every incontinent episode and as needed for redness. May keep at bedside      ROS was provided with assistance of staff.  Review of Systems  Constitutional: Negative for activity change, appetite change, chills, diaphoresis, fatigue and fever.  HENT: Positive for hearing loss. Negative for voice change.   Respiratory: Negative for cough, shortness of breath and wheezing.   Cardiovascular: Positive for leg swelling. Negative for chest pain and palpitations.  Gastrointestinal: Negative for abdominal distention, abdominal pain, constipation, diarrhea, nausea and vomiting.  Genitourinary: Negative for difficulty urinating, dysuria and urgency.  Musculoskeletal: Positive for arthralgias, back pain and gait problem.  Skin: Negative for color change and pallor.  Neurological: Negative for dizziness, speech difficulty, weakness and headaches.       Dementia  Psychiatric/Behavioral: Positive for confusion and dysphoric mood. Negative for agitation, behavioral problems, hallucinations and sleep disturbance.    Immunization History  Administered Date(s) Administered   HiB (PRP-OMP) 02/12/2004   Influenza Split 01/11/2012   Influenza Whole 01/07/2009, 01/15/2018   Influenza-Unspecified 01/11/2014, 01/01/2015, 01/29/2016, 02/01/2017   PPD Test 11/14/2014, 06/03/2015, 06/17/2015   Pneumococcal Conjugate-13 02/08/2017   Pneumococcal Polysaccharide-23 04/28/2010   Tdap 08/24/2011   Zoster  04/13/2010   Pertinent  Health Maintenance Due  Topic Date Due   INFLUENZA VACCINE  11/12/2018   DEXA SCAN  Completed   PNA vac Low Risk Adult  Completed   Fall Risk  01/04/2018 12/29/2016 11/02/2016 06/20/2015 05/23/2015  Falls in the past year? No No Yes No No  Comment - - Emmi Telephone Survey: data to providers prior to load - -  Number falls in past  yr: - - 2 or more - -  Comment - - Emmi Telephone Survey Actual Response = 90 - -  Injury with Fall? - - Yes - -   Functional Status Survey:    Vitals:   02/27/19 1428  BP: 132/68  Pulse: 68  Resp: (!) 21  Temp: 97.8 F (36.6 C)  SpO2: 94%  Weight: 144 lb (65.3 kg)  Height: _0  (1.651 m)   Body mass index is 23.96 kg/m. Physical Exam Vitals signs and nursing note reviewed.  Constitutional:      Appearance: Normal appearance. She is normal weight.  HENT:     Head: Normocephalic and atraumatic.     Nose: Nose normal.     Mouth/Throat:     Mouth: Mucous membranes are moist.  Eyes:     Extraocular Movements: Extraocular movements intact.     Conjunctiva/sclera: Conjunctivae normal.     Pupils: Pupils are equal, round, and reactive to light.  Neck:     Musculoskeletal: Normal range of motion.  Cardiovascular:     Rate and Rhythm: Normal rate and regular rhythm.     Heart sounds: No murmur.  Pulmonary:     Breath sounds: No wheezing, rhonchi or rales.  Abdominal:     General: Bowel sounds are normal. There is no distension.     Palpations: Abdomen is soft.     Tenderness: There is no abdominal tenderness. There is no right CVA tenderness, left CVA tenderness or guarding.  Musculoskeletal:        General: No deformity or signs of injury.     Right lower leg: Edema present.     Left lower leg: Edema present.     Comments: Trace edema BLE  Skin:    General: Skin is warm and dry.  Neurological:     General: No focal deficit present.     Mental Status: She is alert. Mental status is at baseline.     Cranial Nerves:  No cranial nerve deficit.     Motor: No weakness.     Coordination: Coordination normal.     Gait: Gait abnormal.     Comments: Oriented to self  Psychiatric:     Comments: Sad facial looks.     Labs reviewed: Recent Labs    11/15/18 12/06/18 12/27/18  NA 142 141 142  K 3.4 4.6 4.3  CL 104 104 106  CO2 _1 BUN 43* 18 20  CREATININE 1.2* 0.9 1.0  CALCIUM 11.0 10.9 10.4   Recent Labs    05/19/18  AST 18  ALT 16   Recent Labs    05/19/18  HGB 13.0  HCT 38  PLT 351   Lab Results  Component Value Date   TSH 1.90 06/24/2017   Lab Results  Component Value Date   HGBA1C 4.4 11/14/2015   Lab Results  Component Value Date   CHOL 176 05/15/2014   HDL 62 05/15/2014   LDLCALC 95 05/15/2014   LDLDIRECT 114.3 12/08/2007   TRIG 93 05/15/2014   CHOLHDL 2.9 CALC 12/08/2007    Significant Diagnostic Results in last 30 days:  No results found.  Assessment/Plan Depression due to dementia [facility]) Sad facial looks is apparent, reported increased aggressive behaviors and depression. Will increase Sertraline 36m qd, update CBC/diff, CMP/eGR, TSH   Moderate dementia without behavioral disturbance (HCC) Progressing, the patient's daughter reported the patient did not recognize her last time she visited her mother. Continue Memantine, Donepezil, continue SNF  [facility] for safety, care assistance.   Essential hypertension Blood pressure is controlled, continue Losartan 83m qd.   Osteoarthritis (arthritis due to wear and tear of joints) C/o lower back pain, continue Tylenol 5018mtid, w/c for mobility.   PVD (peripheral vascular disease) (HCC) Trace edema BLE, continue Metolazone 2.73m34mx/wk, daily Furosemide 18m44m.      Family/ staff Communication: plan of care reviewed with the patient and charge nurse.   Labs/tests ordered: CBC/diff, CMP/eGFR, TSH  Time spend 25 minutes.

## 2019-02-28 DIAGNOSIS — E039 Hypothyroidism, unspecified: Secondary | ICD-10-CM | POA: Diagnosis not present

## 2019-02-28 DIAGNOSIS — Z20828 Contact with and (suspected) exposure to other viral communicable diseases: Secondary | ICD-10-CM | POA: Diagnosis not present

## 2019-02-28 DIAGNOSIS — F329 Major depressive disorder, single episode, unspecified: Secondary | ICD-10-CM | POA: Diagnosis not present

## 2019-03-01 LAB — BASIC METABOLIC PANEL
BUN: 25 — AB (ref 4–21)
CO2: 30 — AB (ref 13–22)
Chloride: 106 (ref 99–108)
Creatinine: 1 (ref 0.5–1.1)
Glucose: 93
Potassium: 4.3 (ref 3.4–5.3)
Sodium: 143 (ref 137–147)

## 2019-03-01 LAB — HEPATIC FUNCTION PANEL
ALT: 13 (ref 7–35)
AST: 15 (ref 13–35)
Alkaline Phosphatase: 43 (ref 25–125)
Bilirubin, Total: 0.3

## 2019-03-01 LAB — COMPREHENSIVE METABOLIC PANEL
Albumin: 3.3 — AB (ref 3.5–5.0)
Calcium: 10 (ref 8.7–10.7)
Globulin: 2.3

## 2019-03-01 LAB — CBC AND DIFFERENTIAL
HCT: 35 — AB (ref 36–46)
Hemoglobin: 11.3 — AB (ref 12.0–16.0)
Neutrophils Absolute: 5756
Platelets: 365 (ref 150–399)
WBC: 8.2

## 2019-03-01 LAB — CBC: RBC: 3.62 — AB (ref 3.87–5.11)

## 2019-03-01 LAB — TSH: TSH: 1.25 (ref 0.41–5.90)

## 2019-03-08 ENCOUNTER — Encounter: Payer: Self-pay | Admitting: Nurse Practitioner

## 2019-03-08 ENCOUNTER — Non-Acute Institutional Stay (SKILLED_NURSING_FACILITY): Payer: Medicare Other | Admitting: Nurse Practitioner

## 2019-03-08 DIAGNOSIS — I10 Essential (primary) hypertension: Secondary | ICD-10-CM | POA: Diagnosis not present

## 2019-03-08 DIAGNOSIS — G8929 Other chronic pain: Secondary | ICD-10-CM

## 2019-03-08 DIAGNOSIS — F0393 Unspecified dementia, unspecified severity, with mood disturbance: Secondary | ICD-10-CM

## 2019-03-08 DIAGNOSIS — M545 Low back pain: Secondary | ICD-10-CM

## 2019-03-08 DIAGNOSIS — F0280 Dementia in other diseases classified elsewhere without behavioral disturbance: Secondary | ICD-10-CM | POA: Diagnosis not present

## 2019-03-08 DIAGNOSIS — F424 Excoriation (skin-picking) disorder: Secondary | ICD-10-CM | POA: Diagnosis not present

## 2019-03-08 DIAGNOSIS — F329 Major depressive disorder, single episode, unspecified: Secondary | ICD-10-CM | POA: Diagnosis not present

## 2019-03-08 DIAGNOSIS — F0390 Unspecified dementia without behavioral disturbance: Secondary | ICD-10-CM | POA: Diagnosis not present

## 2019-03-08 DIAGNOSIS — I739 Peripheral vascular disease, unspecified: Secondary | ICD-10-CM | POA: Diagnosis not present

## 2019-03-08 DIAGNOSIS — F03B0 Unspecified dementia, moderate, without behavioral disturbance, psychotic disturbance, mood disturbance, and anxiety: Secondary | ICD-10-CM

## 2019-03-08 NOTE — Assessment & Plan Note (Signed)
Chronic edema BLE, continue Furosemide 20mg  qd, Metolazone 2.5mg  qd 2x/wk.

## 2019-03-08 NOTE — Progress Notes (Signed)
Location:   [facility]   Place of Service:   [facility] Provider: [facility] Filipe Greathouse NP  Avynn Klassen X, NP  Patient Care Team: Zyron Deeley X, NP as PCP - General (Internal Medicine) Carsin Randazzo X, NP as Nurse Practitioner (Nurse Practitioner) Virgie Dad, MD as Consulting Physician (Internal Medicine)  Extended Emergency Contact Information Primary Emergency Contact: Gordon Address: [place], [place] [address] [name] of [place] Phone: [phone] Work Phone: [phone] Mobile Phone: [phone] Relation: Daughter Secondary Emergency Contact: [name] Address: [address] [place] [address] [name] of [place] Phone: [phone] Mobile Phone: [phone] Relation: Son  Code Status: DNR Goals of care: Advanced Directive information Advanced Directives 02/14/2019  Does Patient Have a Medical Advance Directive? Yes  Type of Advance Directive Out of facility DNR (pink MOST or yellow form);Living will;Healthcare Power of Attorney  Does patient want to make changes to medical advance directive? No - Patient declined  Copy of [place] in Chart? Yes - validated most recent copy scanned in chart (See row information)  Would patient like information on creating a medical advance directive? -  Pre-existing out of facility DNR order (yellow form or pink MOST form) Yellow form placed in chart (order not valid for inpatient use)     Chief Complaint  Patient presents with   Acute Visit    excessive skin picking    HPI:  Pt is a 85 y.o. female seen today for an acute visit for resident excessively picking skin on BLE, BUE, and shoulders, caused bleeding. Also the patient found skin tags and pulled them off in left thigh, left shoulder. Hx of depression/anxiety, Sertraline was increased to 50mg  02/28/19 due to increased depression per HPOA's request. Hx of dementia, resides in [facility], on  Memantine 5mg  bid, Donepezil 10mg  qd. BLE edema, chronic, on Metolazone 2.5mg  qd 2x/wk,  Furosemide 20mg  qd. HTN, blood pressure is controlled on Losartan 50mg  qd. Chronic lower back pain, controlled on Tylenol 500mg  tid.    Past Medical History:  Diagnosis Date   Abnormality of gait 04/28/2010   History of fall onto right shoulder-has participated in PT. Uses walker.      Acute encephalopathy 10/25/14   Occurred when septic   Acute lower GI bleeding 10/30/2014   Adjustment disorder with mixed anxiety and depressed mood 06/04/2015   11/15/15 Hgb 12.8, Na 139, K 4.2, Bun 14, creat 0.68, TP 5.7, albumin 3.5, TSH 1.54, Hgb a1c 4.4    Anemia    Aortic atherosclerosis ([place]) 10/27/2014   Asthma    Cerebral atrophy ([place]) 10/27/2014   Cerebrovascular disease 10/27/2014   Small vessel disease   Cervical disc disease    Chronic lower back pain 06/04/2015   lumbar spondylosis. Regular f/u Dr. French Ana. Receives injections last 01/2013.  05/27/15 Xray left hip/pelvis: spondylotic changes, severe osteoarthritis Continue Tylenol and Tramadol prn for pain, observe the patient, adding Cymbalta may help. Mid to lower back pain more on the right side back, may consider X-ray of thoracic spine. 06/01/15 CT thoracic spine showed no acute changes.  Patient received 10 injection in the back through Dr. Alvester Morin office. She says the previous problems of discomfort in the back and down the leg have completely resolved. Takes Aleve 220mg  bid    Debility 06/20/2015   11/15/15 Hgb 12.8, Na 139, K 4.2,  Bun 14, creat 0.68, TP 5.7, albumin 3.5, TSH 1.54, Hgb a1c 4.4    Degenerative disc disease, lumbar 10/27/2014   Depression    Dermatitis 03/25/2016   Diarrhea 10/25/14   Diverticulosis 10/27/2014   Essential hypertension 09/20/2006   11/15/15 Hgb 12.8, Na 139, K 4.2, Bun 14, creat 0.68, TP 5.7, albumin 3.5, TSH 1.54, Hgb a1c 4.4    Fall at nursing home 10/25/2014   GERD (gastroesophageal reflux disease)     History of cardiovascular disorder 09/20/2006   X 3 in 2000 now on aspirin.     Hyperlipidemia 09/20/2006   05/15/14 LDL 95    Hypertension    Lumbosacral spondylosis 10/28/2009   OSTEOPOROSIS 09/20/2006   Noted 2008     Overactive bladder 11/29/2013   Myrbetriq prescribed by urology.     Protein calorie malnutrition ([place])    06/06/15 TP 6.0 albumin 2.9   SCOLIOSIS 10/28/2009   Scoliosis 10/27/2014   Thrombocytosis ([place]) 05/31/2015   06/10/15 plt 602 07/23/15 plt 335 11/15/15 Hgb 12.8, Na 139, K 4.2, Bun 14, creat 0.68, TP 5.7, albumin 3.5, TSH 1.54, Hgb a1c 4.4   TIA (transient ischemic attack) 09/20/2006   Past Surgical History:  Procedure Laterality Date   ABDOMINAL HYSTERECTOMY     APPENDECTOMY     CHOLECYSTECTOMY     TONSILLECTOMY      Allergies  Allergen Reactions   Tramadol Other (See Comments)    Hallucinations, agitation/combativeness   Amoxicillin     Per MAR   Oxycodone Other (See Comments)    Per MAR    Sulfamethoxazole     Per MAR    Penicillins Rash    Allergies as of 03/08/2019      Reactions   Tramadol Other (See Comments)   Hallucinations, agitation/combativeness   Amoxicillin    Per MAR   Oxycodone Other (See Comments)   Per MAR    Sulfamethoxazole    Per MAR    Penicillins Rash      Medication List       Accurate as of March 08, 2019  1:23 PM. If you have any questions, ask your nurse or doctor.        acetaminophen 500 MG tablet Commonly known as: TYLENOL Take 500 mg by mouth 3 (three) times daily.   acetaminophen 500 MG tablet Commonly known as: TYLENOL Take 500 mg by mouth every 8 (eight) hours as needed.   albuterol 108 (90 Base) MCG/ACT inhaler Commonly known as: VENTOLIN HFA Inhale 2 puffs into the lungs every 6 (six) hours as needed for wheezing or shortness of breath.   alendronate 70 MG tablet Commonly known as: FOSAMAX Take 70 mg by mouth once a week. Take with a full glass of water on an empty stomach. On  Monday.   alum & mag hydroxide-simeth 200-200-20 MG/5ML suspension Commonly known as: MAALOX/MYLANTA Take 30 mLs by mouth every 4 (four) hours as needed for indigestion or heartburn.   Biofreeze 4 % Gel Generic drug: Menthol (Topical Analgesic) Apply topically 4 (four) times daily. To lower back   calcium carbonate 1500 (600 Ca) MG Tabs tablet Commonly known as: OSCAL Take 600 mg of elemental calcium by mouth daily.   camphor-menthol lotion Commonly known as: SARNA Apply 1 application topically as needed. To arms in am and legs at bed apply to bilateral lower extremities every night at bedtime for itching   . Can also use as needed.   cetaphil cream Apply 1 application topically  daily. APPLY  TO BILATERAL ARMS AND LEGS TO MOISTURIZE SKIN   clobetasol cream 0.05 % Commonly known as: TEMOVATE Apply 1 application topically 2 (two) times daily as needed.   donepezil 10 MG tablet Commonly known as: ARICEPT Take 10 mg by mouth at bedtime.   famotidine 10 MG tablet Commonly known as: PEPCID Take 10 mg by mouth at bedtime.   furosemide 20 MG tablet Commonly known as: LASIX Take 20 mg by mouth daily.   hydrocortisone 1 % lotion Apply 1 application topically. Once a day   lactose free nutrition Liqd Take 237 mLs by mouth daily.   losartan 100 MG tablet Commonly known as: COZAAR Take 50 mg by mouth daily. Take 1/2 tablet to equal 50 mg.   memantine 5 MG tablet Commonly known as: NAMENDA Take 5 mg by mouth 2 (two) times daily.   metolazone 2.5 MG tablet Commonly known as: ZAROXOLYN Take 2.5 mg by mouth daily. Mon and Fri   Myrbetriq 25 MG Tb24 tablet Generic drug: mirabegron ER Take 25 mg by mouth daily with breakfast. Prescribed by urology   nitroGLYCERIN 0.4 MG SL tablet Commonly known as: NITROSTAT Place 0.4 mg under the tongue every 5 (five) minutes as needed for chest pain. After taking three times and no relief call MD.   polyethylene glycol 17 g  packet Commonly known as: MIRALAX / GLYCOLAX Take 17 g by mouth daily as needed for moderate constipation.   potassium chloride SA 20 MEQ tablet Commonly known as: KLOR-CON Take 40 mEq by mouth daily.   pyridOXINE 100 MG tablet Commonly known as: VITAMIN B-6 Take 100 mg by mouth daily.   sertraline 25 MG tablet Commonly known as: ZOLOFT Take 25 mg by mouth daily.   zinc oxide 20 % ointment Apply 1 application topically as needed for irritation. To buttocks after every incontinent episode and as needed for redness. May keep at bedside      ROS was provided with assistance of staff.  Review of Systems  Constitutional: Negative for activity change, appetite change, chills, diaphoresis, fatigue and fever.  HENT: Positive for hearing loss. Negative for congestion and voice change.   Respiratory: Negative for cough, shortness of breath and wheezing.   Cardiovascular: Positive for leg swelling. Negative for chest pain and palpitations.  Gastrointestinal: Negative for abdominal distention, abdominal pain, constipation, diarrhea, nausea and vomiting.  Genitourinary: Negative for difficulty urinating, dysuria and urgency.  Musculoskeletal: Positive for arthralgias, back pain and gait problem.  Skin: Negative for color change and pallor.       Scratched injuries all limbs.   Neurological: Negative for dizziness, speech difficulty, weakness and headaches.       Dementia  Psychiatric/Behavioral: Positive for behavioral problems, confusion and dysphoric mood. Negative for agitation, hallucinations and sleep disturbance. The patient is nervous/anxious.     Immunization History  Administered Date(s) Administered   HiB (PRP-OMP) 02/12/2004   Influenza Split 01/11/2012   Influenza Whole 01/07/2009, 01/15/2018   Influenza-Unspecified 01/11/2014, 01/01/2015, 01/29/2016, 02/01/2017   PPD Test 11/14/2014, 06/03/2015, 06/17/2015   Pneumococcal Conjugate-13 02/08/2017   Pneumococcal  Polysaccharide-23 04/28/2010   Tdap 08/24/2011   Zoster 04/13/2010   Pertinent  Health Maintenance Due  Topic Date Due   INFLUENZA VACCINE  11/12/2018   DEXA SCAN  Completed   PNA vac Low Risk Adult  Completed   Fall Risk  01/04/2018 12/29/2016 11/02/2016 06/20/2015 05/23/2015  Falls in the past year? No No Yes No No  Comment - -  Emmi Telephone Survey: data to providers prior to load - -  Number falls in past yr: - - 2 or more - -  Comment - - Emmi Telephone Survey Actual Response = 90 - -  Injury with Fall? - - Yes - -   Functional Status Survey:    Vitals:   03/08/19 1302  BP: 132/68  Pulse: 68  Resp: 18  Temp: 97.7 F (36.5 C)  SpO2: 93%   There is no height or weight on file to calculate BMI. Physical Exam Vitals signs and nursing note reviewed.  Constitutional:      General: She is not in acute distress.    Appearance: Normal appearance. She is not ill-appearing, toxic-appearing or diaphoretic.  HENT:     Head: Normocephalic and atraumatic.     Nose: Nose normal.     Mouth/Throat:     Mouth: Mucous membranes are moist.  Eyes:     Conjunctiva/sclera: Conjunctivae normal.     Pupils: Pupils are equal, round, and reactive to light.  Neck:     Musculoskeletal: Neck supple.  Cardiovascular:     Rate and Rhythm: Normal rate and regular rhythm.     Heart sounds: No murmur.  Pulmonary:     Breath sounds: No wheezing, rhonchi or rales.  Abdominal:     General: Bowel sounds are normal. There is no distension.     Palpations: Abdomen is soft.     Tenderness: There is no abdominal tenderness. There is no right CVA tenderness, left CVA tenderness, guarding or rebound.  Musculoskeletal:     Right lower leg: Edema present.     Left lower leg: Edema present.  Skin:    General: Skin is warm and dry.     Comments: Scratched injuries in limbs.   Neurological:     General: No focal deficit present.     Mental Status: She is alert. Mental status is at baseline.     Motor:  No weakness.     Coordination: Coordination normal.     Gait: Gait abnormal.     Comments: Oriented to self.   Psychiatric:     Comments: Sad facial looks.      Labs reviewed: Recent Labs    11/15/18 12/06/18 12/27/18  NA 142 141 142  K 3.4 4.6 4.3  CL 104 104 106  CO2 29 29 29   BUN 43* 18 20  CREATININE 1.2* 0.9 1.0  CALCIUM 11.0 10.9 10.4   Recent Labs    05/19/18  AST 18  ALT 16   Recent Labs    05/19/18  HGB 13.0  HCT 38  PLT 351   Lab Results  Component Value Date   TSH 1.90 06/24/2017   Lab Results  Component Value Date   HGBA1C 4.4 11/14/2015   Lab Results  Component Value Date   CHOL 176 05/15/2014   HDL 62 05/15/2014   LDLCALC 95 05/15/2014   LDLDIRECT 114.3 12/08/2007   TRIG 93 05/15/2014   CHOLHDL 2.9 CALC 12/08/2007    Significant Diagnostic Results in last 30 days:  No results found.  Assessment/Plan: Depression due to dementia [facility]) Sad facial looks, but sleeps/eats at her baseline, reported skin picking, continue Zoloft 103m qd since a week ago, too soon to eval.   Compulsive skin picking Depression, anxiety, dementia are contributing. Apply Sarna lotion daily. Adding Depakote 125mg  qd for compulsive behaviors. Observe.   Chronic midline low back pain without sciatica Chronic, controlled, continue Tylenol 500mg   tid.   Moderate dementia without behavioral disturbance (HCC) Progressing, continue [facility] for safety, care assistance, w/c for mobility, continue Memantine 5mg  bid, Donepezil 10mg  q.   Essential hypertension Blood pressure is controlled, continue Losartan 50mg  qd.   PVD (peripheral vascular disease) (HCC) Chronic edema BLE, continue Furosemide 20mg  qd, Metolazone 2.5mg  qd 2x/wk.     Family/ staff Communication: plan of care reviewed with the patient and charge nurse.   Labs/tests ordered: none  Time spend 25 minutes.

## 2019-03-08 NOTE — Assessment & Plan Note (Signed)
Progressing, continue [facility] for safety, care assistance, w/c for mobility, continue Memantine 5mg  bid, Donepezil 10mg  q.

## 2019-03-08 NOTE — Assessment & Plan Note (Signed)
Chronic, controlled, continue Tylenol 500mg  tid.

## 2019-03-08 NOTE — Assessment & Plan Note (Signed)
Sad facial looks, but sleeps/eats at her baseline, reported skin picking, continue Zoloft 41m qd since a week ago, too soon to eval.

## 2019-03-08 NOTE — Assessment & Plan Note (Signed)
Depression, anxiety, dementia are contributing. Apply Sarna lotion daily. Adding Depakote 125mg  qd for compulsive behaviors. Observe.

## 2019-03-08 NOTE — Assessment & Plan Note (Signed)
Blood pressure is controlled, continue Losartan 50mg  qd.

## 2019-03-20 ENCOUNTER — Non-Acute Institutional Stay (SKILLED_NURSING_FACILITY): Payer: Medicare Other | Admitting: Nurse Practitioner

## 2019-03-20 ENCOUNTER — Encounter: Payer: Self-pay | Admitting: Nurse Practitioner

## 2019-03-20 DIAGNOSIS — R600 Localized edema: Secondary | ICD-10-CM

## 2019-03-20 DIAGNOSIS — F0390 Unspecified dementia without behavioral disturbance: Secondary | ICD-10-CM | POA: Diagnosis not present

## 2019-03-20 DIAGNOSIS — K219 Gastro-esophageal reflux disease without esophagitis: Secondary | ICD-10-CM | POA: Diagnosis not present

## 2019-03-20 DIAGNOSIS — F424 Excoriation (skin-picking) disorder: Secondary | ICD-10-CM

## 2019-03-20 DIAGNOSIS — M159 Polyosteoarthritis, unspecified: Secondary | ICD-10-CM | POA: Diagnosis not present

## 2019-03-20 DIAGNOSIS — I10 Essential (primary) hypertension: Secondary | ICD-10-CM

## 2019-03-20 DIAGNOSIS — N3281 Overactive bladder: Secondary | ICD-10-CM

## 2019-03-20 DIAGNOSIS — F339 Major depressive disorder, recurrent, unspecified: Secondary | ICD-10-CM | POA: Diagnosis not present

## 2019-03-20 DIAGNOSIS — F03B0 Unspecified dementia, moderate, without behavioral disturbance, psychotic disturbance, mood disturbance, and anxiety: Secondary | ICD-10-CM

## 2019-03-20 NOTE — Assessment & Plan Note (Signed)
Mood and dementia are contributory, continue Sertraline, Depakote.

## 2019-03-20 NOTE — Assessment & Plan Note (Signed)
Blood pressure is controlled, continue Losartan 50mg  qd.

## 2019-03-20 NOTE — Assessment & Plan Note (Signed)
Stable, continue Famotidine 10mg qd.  

## 2019-03-20 NOTE — Assessment & Plan Note (Signed)
Chronic, trace, continue Furosemide, Metolazone.

## 2019-03-20 NOTE — Progress Notes (Signed)
Location:   [facility] Room Number: 3 Place of Service:  SNF (31) Provider: Rilda Bulls X, NP  Brandy Casciano X, NP  Patient Care Team: Brandy Wilcoxson X, NP as PCP - General (Internal Medicine) Brandy Howald X, NP as Nurse Practitioner (Nurse Practitioner) Brandy Dad, MD as Consulting Physician (Internal Medicine)  Extended Emergency Contact Information Primary Emergency Contact: [facility] Address[place] [facility] [address] [facility] [place] Phone: [phone] Work Phone: [phone] Mobile Phone: [phone] Relation: Daughter Secondary Emergency Contact: [facility] Address: [address] [place] [address] [facility] [place] Phone: [phone] Mobile Phone: [phone] Relation: Son  Code Status:  DNR Goals of care: Advanced Directive information Advanced Directives 03/20/2019  Does Patient Have a Medical Advance Directive? Yes  Type of Advance Directive Out of facility DNR (pink MOST or yellow form);Healthcare Power of Attorney  Does patient want to make changes to medical advance directive? No - Patient declined  Copy of [place] in Chart? Yes - validated most recent copy scanned in chart (See row information)  Would patient like information on creating a medical advance directive? -  Pre-existing out of facility DNR order (yellow form or pink MOST form) Yellow form placed in chart (order not valid for inpatient use)     Chief Complaint  Patient presents with  . Medical Management of Chronic Issues    HPI:  Pt is a 85 y.o. female seen today for medical management of chronic diseases.    The patient resides in [facility] for safety, care assistance, on Memantine 5mg  bid, Donepezil 10mg  qd for memory, her mood is stabilizing, on Sertraline 50mg  qd, Depakote 125mg  qd, skin picking is slightly less. BLE edema, trace, on Furosemide 20mg  qd, Metolazone 2.5mg  qd. Urinary  frequency, stable, on Myrbetriq 25mg  qd. HTN, blood pressure is controlled, on Losartan 50mg  qd. GERD, stable, on Famotidine 10mg  qd. Lower back pain, stable, on Tylenol 500mg  tid.    Past Medical History:  Diagnosis Date  . Abnormality of gait 04/28/2010   History of fall onto right shoulder-has participated in PT. Uses walker.     . Acute encephalopathy 10/25/14   Occurred when septic  . Acute lower GI bleeding 10/30/2014  . Adjustment disorder with mixed anxiety and depressed mood 06/04/2015   11/15/15 Hgb 12.8, Na 139, K 4.2, Bun 14, creat 0.68, TP 5.7, albumin 3.5, TSH 1.54, Hgb a1c 4.4   . Anemia   . Aortic atherosclerosis ([place]) 10/27/2014  . Asthma   . Cerebral atrophy ([place]) 10/27/2014  . Cerebrovascular disease 10/27/2014   Small vessel disease  . Cervical disc disease   . Chronic lower back pain 06/04/2015   lumbar spondylosis. Regular f/u Dr. French Ana. Receives injections last 01/2013.  05/27/15 Xray left hip/pelvis: spondylotic changes, severe osteoarthritis Continue Tylenol and Tramadol prn for pain, observe the patient, adding Cymbalta may help. Mid to lower back pain more on the right side back, may consider [facility]-ray of thoracic spine. 06/01/15 CT thoracic spine showed no acute changes.  Patient received 10 injection in the back through Dr. Alvester Morin office. She says the previous problems of discomfort in the back and down the leg have completely resolved. Takes Aleve 220mg  bid   . Debility 06/20/2015   11/15/15 Hgb 12.8, Na 139, K 4.2, Bun 14, creat 0.68, TP 5.7, albumin  3.5, TSH 1.54, Hgb a1c 4.4   . Degenerative disc disease, lumbar 10/27/2014  . Depression   . Dermatitis 03/25/2016  . Diarrhea 10/25/14  . Diverticulosis 10/27/2014  . Essential hypertension 09/20/2006   11/15/15 Hgb 12.8, Na 139, K 4.2, Bun 14, creat 0.68, TP 5.7, albumin 3.5, TSH 1.54, Hgb a1c 4.4   . Fall at nursing home 10/25/2014  . GERD (gastroesophageal reflux disease)   . History of cardiovascular disorder 09/20/2006    Crawford 3 in 2000 now on aspirin.    Marland Kitchen Hyperlipidemia 09/20/2006   05/15/14 LDL 95   . Hypertension   . Lumbosacral spondylosis 10/28/2009  . OSTEOPOROSIS 09/20/2006   Noted 2008    . Overactive bladder 11/29/2013   Myrbetriq prescribed by urology.    . Protein calorie malnutrition ([place])    06/06/15 TP 6.0 albumin 2.9  . SCOLIOSIS 10/28/2009  . Scoliosis 10/27/2014  . Thrombocytosis ([place]) 05/31/2015   06/10/15 plt 602 07/23/15 plt 335 11/15/15 Hgb 12.8, Na 139, K 4.2, Bun 14, creat 0.68, TP 5.7, albumin 3.5, TSH 1.54, Hgb a1c 4.4  . TIA (transient ischemic attack) 09/20/2006   Past Surgical History:  Procedure Laterality Date  . ABDOMINAL HYSTERECTOMY    . APPENDECTOMY    . CHOLECYSTECTOMY    . TONSILLECTOMY      Allergies  Allergen Reactions  . Tramadol Other (See Comments)    Hallucinations, agitation/combativeness  . Amoxicillin     Per MAR  . Oxycodone Other (See Comments)    Per MAR   . Sulfamethoxazole     Per MAR   . Penicillins Rash    Allergies as of 03/20/2019      Reactions   Tramadol Other (See Comments)   Hallucinations, agitation/combativeness   Amoxicillin    Per MAR   Oxycodone Other (See Comments)   Per MAR    Sulfamethoxazole    Per MAR    Penicillins Rash      Medication List       Accurate as of March 20, 2019  4:58 PM. If you have any questions, ask your nurse or doctor.        acetaminophen 500 MG tablet Commonly known as: TYLENOL Take 500 mg by mouth 3 (three) times daily.   acetaminophen 500 MG tablet Commonly known as: TYLENOL Take 500 mg by mouth every 8 (eight) hours as needed.   albuterol 108 (90 Base) MCG/ACT inhaler Commonly known as: VENTOLIN HFA Inhale 2 puffs into the lungs every 6 (six) hours as needed for wheezing or shortness of breath.   alendronate 70 MG tablet Commonly known as: FOSAMAX Take 70 mg by mouth once a week. Take with a full glass of water on an empty stomach. On Monday.   alum & mag hydroxide-simeth 200-200-20  MG/5ML suspension Commonly known as: MAALOX/MYLANTA Take 30 mLs by mouth every 4 (four) hours as needed for indigestion or heartburn.   Biofreeze 4 % Gel Generic drug: Menthol (Topical Analgesic) Apply topically 4 (four) times daily. To lower back   calcium carbonate 1500 (600 Ca) MG Tabs tablet Commonly known as: OSCAL Take 600 mg of elemental calcium by mouth daily.   camphor-menthol lotion Commonly known as: SARNA Apply 1 application topically as needed. To arms in am and legs at bed apply to bilateral lower extremities every night at bedtime for itching   . Can also use as needed.   cetaphil cream Apply 1 application topically daily. APPLY  TO BILATERAL ARMS AND  LEGS TO MOISTURIZE SKIN   clobetasol cream 0.05 % Commonly known as: TEMOVATE Apply 1 application topically 2 (two) times daily as needed.   divalproex 125 MG DR tablet Commonly known as: DEPAKOTE Take 125 mg by mouth daily. What changed: Another medication with the same name was removed. Continue taking this medication, and follow the directions you see here. Changed by: Estreya [facility] Moishe Schellenberg, NP   donepezil 10 MG tablet Commonly known as: ARICEPT Take 10 mg by mouth at bedtime.   famotidine 10 MG tablet Commonly known as: PEPCID Take 10 mg by mouth at bedtime.   furosemide 20 MG tablet Commonly known as: LASIX Take 20 mg by mouth daily.   hydrocortisone 1 % lotion Apply 1 application topically. Once a day   lactose free nutrition Liqd Take 237 mLs by mouth daily.   losartan 100 MG tablet Commonly known as: COZAAR Take 50 mg by mouth daily. Take 1/2 tablet to equal 50 mg.   memantine 5 MG tablet Commonly known as: NAMENDA Take 5 mg by mouth 2 (two) times daily.   metolazone 2.5 MG tablet Commonly known as: ZAROXOLYN Take 2.5 mg by mouth daily. Mon and Fri   Myrbetriq 25 MG Tb24 tablet Generic drug: mirabegron ER Take 25 mg by mouth daily with breakfast. Prescribed by urology   nitroGLYCERIN 0.4 MG SL  tablet Commonly known as: NITROSTAT Place 0.4 mg under the tongue every 5 (five) minutes as needed for chest pain. After taking three times and no relief call MD.   polyethylene glycol 17 g packet Commonly known as: MIRALAX / GLYCOLAX Take 17 g by mouth daily as needed for moderate constipation.   potassium chloride SA 20 MEQ tablet Commonly known as: KLOR-CON Take 40 mEq by mouth daily.   pyridOXINE 100 MG tablet Commonly known as: VITAMIN B-6 Take 100 mg by mouth daily.   sertraline 50 MG tablet Commonly known as: ZOLOFT Take 50 mg by mouth daily.   zinc oxide 20 % ointment Apply 1 application topically as needed for irritation. To buttocks after every incontinent episode and as needed for redness. May keep at bedside      ROS was provided with assistance of staff.  Review of Systems  Constitutional: Negative for activity change, appetite change, chills, diaphoresis, fatigue and fever.  HENT: Positive for hearing loss. Negative for congestion and voice change.   Respiratory: Negative for cough, shortness of breath and wheezing.   Cardiovascular: Positive for leg swelling. Negative for chest pain and palpitations.  Gastrointestinal: Negative for abdominal distention, abdominal pain, constipation, diarrhea, nausea and vomiting.  Genitourinary: Negative for difficulty urinating, dysuria and urgency.  Musculoskeletal: Positive for arthralgias, back pain and gait problem.  Skin: Positive for wound.       Scratched injuries arms, legs.   Neurological: Negative for dizziness, facial asymmetry, weakness and numbness.       Dementia  Psychiatric/Behavioral: Positive for confusion. Negative for agitation, behavioral problems, hallucinations and sleep disturbance. The patient is not nervous/anxious.        Low voice, flat affect, answers simple questions, scratch skin with out itching.     Immunization History  Administered Date(s) Administered  . HiB (PRP-OMP) 02/12/2004  .  Influenza Split 01/11/2012  . Influenza Whole 01/07/2009, 01/15/2018  . Influenza-Unspecified 01/11/2014, 01/01/2015, 01/29/2016, 02/01/2017  . PPD Test 11/14/2014, 06/03/2015, 06/17/2015  . Pneumococcal Conjugate-13 02/08/2017  . Pneumococcal Polysaccharide-23 04/28/2010  . Tdap 08/24/2011  . Zoster 04/13/2010   Pertinent  Health Maintenance  Due  Topic Date Due  . INFLUENZA VACCINE  Completed  . DEXA SCAN  Completed  . PNA vac Low Risk Adult  Completed   Fall Risk  01/04/2018 12/29/2016 11/02/2016 06/20/2015 05/23/2015  Falls in the past year? No No Yes No No  Comment - - Emmi Telephone Survey: data to providers prior to load - -  Number falls in past yr: - - 2 or more - -  Comment - - Emmi Telephone Survey Actual Response = 90 - -  Injury with Fall? - - Yes - -   Functional Status Survey:    Vitals:   03/20/19 1451  BP: 132/60  Pulse: 76  Resp: 20  Temp: (!) 96.9 F (36.1 C)  SpO2: 95%  Weight: 142 lb 12.8 oz (64.8 kg)  Height: 5\' 5"  (1.651 m)   Body mass index is 23.76 kg/m. Physical Exam Vitals signs and nursing note reviewed.  Constitutional:      General: She is not in acute distress.    Appearance: Normal appearance. She is not ill-appearing, toxic-appearing or diaphoretic.  HENT:     Head: Normocephalic and atraumatic.     Nose: Nose normal.     Mouth/Throat:     Mouth: Mucous membranes are moist.  Eyes:     Extraocular Movements: Extraocular movements intact.     Conjunctiva/sclera: Conjunctivae normal.     Pupils: Pupils are equal, round, and reactive to light.  Neck:     Musculoskeletal: Normal range of motion and neck supple.  Cardiovascular:     Rate and Rhythm: Normal rate and regular rhythm.     Heart sounds: No murmur.  Pulmonary:     Breath sounds: No wheezing or rales.  Abdominal:     General: Bowel sounds are normal.     Palpations: Abdomen is soft.     Tenderness: There is no abdominal tenderness. There is no right CVA tenderness, guarding or  rebound.  Musculoskeletal:     Right lower leg: Edema present.     Left lower leg: Edema present.     Comments: Trace edema BLE  Skin:    General: Skin is warm and dry.     Comments: Scratched injuries arms, legs.   Neurological:     General: No focal deficit present.     Mental Status: She is alert. Mental status is at baseline.     Motor: No weakness.     Coordination: Coordination normal.     Gait: Gait abnormal.     Comments: Oriented to self.   Psychiatric:     Comments: Low voice, flat affect, answered simple questions, followed simple directions.      Labs reviewed: Recent Labs    12/06/18 12/27/18 03/01/19  NA 141 142 143  K 4.6 4.3 4.3  CL 104 106 106  CO2 29 29 30*  BUN 18 20 25*  CREATININE 0.9 1.0 1.0  CALCIUM 10.9 10.4 10.0   Recent Labs    05/19/18 03/01/19  AST 18 15  ALT 16 13  ALKPHOS  --  43  ALBUMIN  --  3.3*   Recent Labs    05/19/18 03/01/19  WBC  --  8.2  NEUTROABS  --  5,756  HGB 13.0 11.3*  HCT 38 35*  PLT 351 365   Lab Results  Component Value Date   TSH 1.25 03/01/2019   Lab Results  Component Value Date   HGBA1C 4.4 11/14/2015   Lab Results  Component  Value Date   CHOL 176 05/15/2014   HDL 62 05/15/2014   LDLCALC 95 05/15/2014   LDLDIRECT 114.3 12/08/2007   TRIG 93 05/15/2014   CHOLHDL 2.9 CALC 12/08/2007    Significant Diagnostic Results in last 30 days:  No results found.  Assessment/Plan Essential hypertension Blood pressure is controlled, continue Losartan 50mg  qd.   GERD (gastroesophageal reflux disease) Stable, continue Famotidine 10mg  qd.   Moderate dementia without behavioral disturbance (HCC) Continue SNF [facility] for safety, care assistance, continue Mementine, Donepezil, Depakote.   Depression, recurrent ([place]) Her mood is stabilizing, continue Sertraline 50mg  qd.   Osteoarthritis (arthritis due to wear and tear of joints) Mostly in lower back, stable, continue Tylenol, w/c  Overactive bladder No  urinary retention, continue Myrbetriq  Leg edema Chronic, trace, continue Furosemide, Metolazone.   Compulsive skin picking Mood and dementia are contributory, continue Sertraline, Depakote.      Family/ staff Communication: plan of care reviewed with the patient and charge nurse.   Labs/tests ordered:  none  Time spend 25 minutes.

## 2019-03-20 NOTE — Assessment & Plan Note (Signed)
Continue SNF [facility] for safety, care assistance, continue Mementine, Donepezil, Depakote.

## 2019-03-20 NOTE — Assessment & Plan Note (Signed)
Mostly in lower back, stable, continue Tylenol, w/c

## 2019-03-20 NOTE — Assessment & Plan Note (Signed)
No urinary retention, continue Myrbetriq 

## 2019-03-20 NOTE — Assessment & Plan Note (Signed)
Her mood is stabilizing, continue Sertraline 50mg  qd.

## 2019-03-27 ENCOUNTER — Encounter: Payer: Self-pay | Admitting: Nurse Practitioner

## 2019-03-27 ENCOUNTER — Non-Acute Institutional Stay (SKILLED_NURSING_FACILITY): Payer: Medicare Other | Admitting: Nurse Practitioner

## 2019-03-27 DIAGNOSIS — F339 Major depressive disorder, recurrent, unspecified: Secondary | ICD-10-CM

## 2019-03-27 DIAGNOSIS — L03115 Cellulitis of right lower limb: Secondary | ICD-10-CM | POA: Diagnosis not present

## 2019-03-27 DIAGNOSIS — M545 Low back pain: Secondary | ICD-10-CM | POA: Diagnosis not present

## 2019-03-27 DIAGNOSIS — F424 Excoriation (skin-picking) disorder: Secondary | ICD-10-CM

## 2019-03-27 DIAGNOSIS — I10 Essential (primary) hypertension: Secondary | ICD-10-CM

## 2019-03-27 DIAGNOSIS — I739 Peripheral vascular disease, unspecified: Secondary | ICD-10-CM

## 2019-03-27 DIAGNOSIS — F03B0 Unspecified dementia, moderate, without behavioral disturbance, psychotic disturbance, mood disturbance, and anxiety: Secondary | ICD-10-CM

## 2019-03-27 DIAGNOSIS — G8929 Other chronic pain: Secondary | ICD-10-CM

## 2019-03-27 DIAGNOSIS — F0390 Unspecified dementia without behavioral disturbance: Secondary | ICD-10-CM

## 2019-03-27 NOTE — Progress Notes (Signed)
Location:   [place] Room Number: 3 Place of Service:  SNF (31) Provider: Terrence Wishon X, NP  Adeena Bernabe X, NP  Patient Care Team: Kerrie Timm X, NP as PCP - General (Internal Medicine) Dula Havlik X, NP as Nurse Practitioner (Nurse Practitioner) Virgie Dad, MD as Consulting Physician (Internal Medicine)  Extended Emergency Contact Information Primary Emergency Contact: [place] Address: [place], [place] [address] Br[place] of [place] Phone: [phone] Work Phone: [phone] Mobile Phone: [phone] Relation: Daughter Secondary Emergency Contact: [name] Address: [address] [place] [address] Brandy Crawford of [place] Phone: [phone] Mobile Phone: [phone] Relation: Son  Code Status:  DNR Goals of care: Advanced Directive information Advanced Directives 03/20/2019  Does Patient Have a Medical Advance Directive? Yes  Type of Advance Directive Out of facility DNR (pink MOST or yellow form);Healthcare Power of Attorney  Does patient want to make changes to medical advance directive? No - Patient declined  Copy of [place] in Chart? Yes - validated most recent copy scanned in chart (See row information)  Would patient like information on creating a medical advance directive? -  Pre-existing out of facility DNR order (yellow form or pink MOST form) Yellow form placed in chart (order not valid for inpatient use)     Chief Complaint  Patient presents with  . Acute Visit    Persisted skin picking    HPI:  Pt is a 85 y.o. female seen today for an acute visit for reported the patient's continue picking at her skin. Actually there are less scratched skin injuries in all limbs, but the right shin scratched wound peri wound area showed redness, warmth, tenderness, wound drainage is thick yellow in nature. Hx of dementia, resides in SNF [facility] for safety,  care assistance, on Memantine 5mg  bid, Donepezil 10mg  qd for memory. Her mood is managed with Sertraline 50mg  qd, Depakote 125mg  qd. PVD, chronic edema BLE, on Metolazone 2.5mg  qd 2x/wk, Furosemide 20mg  qd. HTN, blood pressure is controlled, on Losartan 50mg  qd. OA pain, lower back/knees, stable, on Tylenol 500mg  tid.    Past Medical History:  Diagnosis Date  . Abnormality of gait 04/28/2010   History of fall onto right shoulder-has participated in PT. Uses walker.     . Acute encephalopathy 10/25/14   Occurred when septic  . Acute lower GI bleeding 10/30/2014  . Adjustment disorder with mixed anxiety and depressed mood 06/04/2015   11/15/15 Hgb 12.8, Na 139, K 4.2, Bun 14, creat 0.68, TP 5.7, albumin 3.5, TSH 1.54, Hgb a1c 4.4   . Anemia   . Aortic atherosclerosis ([place]) 10/27/2014  . Asthma   . Cerebral atrophy ([place]) 10/27/2014  . Cerebrovascular disease 10/27/2014   Small vessel disease  . Cervical disc disease   . Chronic lower back pain 06/04/2015   lumbar spondylosis. Regular f/u Dr. French Ana. Receives injections last 01/2013.  05/27/15 Xray left hip/pelvis: spondylotic changes, severe osteoarthritis Continue Tylenol and Tramadol prn for pain, observe the patient, adding Cymbalta may help. Mid to lower back pain more on the right side back, may consider X-ray of thoracic spine. 06/01/15 CT thoracic spine showed no acute changes.  Patient received 10 injection in the back through Dr. Alvester Morin office. She says the previous problems of discomfort in the back and down the leg have completely resolved. Takes  Aleve 220mg  bid   . Debility 06/20/2015   11/15/15 Hgb 12.8, Na 139, K 4.2, Bun 14, creat 0.68, TP 5.7, albumin 3.5, TSH 1.54, Hgb a1c 4.4   . Degenerative disc disease, lumbar 10/27/2014  . Depression   . Dermatitis 03/25/2016  . Diarrhea 10/25/14  . Diverticulosis 10/27/2014  . Essential hypertension 09/20/2006   11/15/15 Hgb 12.8, Na 139, K 4.2, Bun 14, creat 0.68, TP 5.7, albumin 3.5, TSH 1.54,  Hgb a1c 4.4   . Fall at nursing home 10/25/2014  . GERD (gastroesophageal reflux disease)   . History of cardiovascular disorder 09/20/2006   X 3 in 2000 now on aspirin.    Marland Kitchen Hyperlipidemia 09/20/2006   05/15/14 LDL 95   . Hypertension   . Lumbosacral spondylosis 10/28/2009  . OSTEOPOROSIS 09/20/2006   Noted 2008    . Overactive bladder 11/29/2013   Myrbetriq prescribed by urology.    . Protein calorie malnutrition ([place])    06/06/15 TP 6.0 albumin 2.9  . SCOLIOSIS 10/28/2009  . Scoliosis 10/27/2014  . Thrombocytosis ([place]) 05/31/2015   06/10/15 plt 602 07/23/15 plt 335 11/15/15 Hgb 12.8, Na 139, K 4.2, Bun 14, creat 0.68, TP 5.7, albumin 3.5, TSH 1.54, Hgb a1c 4.4  . TIA (transient ischemic attack) 09/20/2006   Past Surgical History:  Procedure Laterality Date  . ABDOMINAL HYSTERECTOMY    . APPENDECTOMY    . CHOLECYSTECTOMY    . TONSILLECTOMY      Allergies  Allergen Reactions  . Tramadol Other (See Comments)    Hallucinations, agitation/combativeness  . Amoxicillin     Per MAR  . Oxycodone Other (See Comments)    Per MAR   . Sulfamethoxazole     Per MAR   . Penicillins Rash    Allergies as of 03/27/2019      Reactions   Tramadol Other (See Comments)   Hallucinations, agitation/combativeness   Amoxicillin    Per MAR   Oxycodone Other (See Comments)   Per MAR    Sulfamethoxazole    Per MAR    Penicillins Rash      Medication List       Accurate as of March 27, 2019 11:59 PM. If you have any questions, ask your nurse or doctor.        STOP taking these medications   hydrocortisone 1 % lotion Stopped by: Irene Mitcham X Weaver Tweed, NP     TAKE these medications   acetaminophen 500 MG tablet Commonly known as: TYLENOL Take 500 mg by mouth 3 (three) times daily.   acetaminophen 500 MG tablet Commonly known as: TYLENOL Take 500 mg by mouth every 8 (eight) hours as needed.   albuterol 108 (90 Base) MCG/ACT inhaler Commonly known as: VENTOLIN HFA Inhale 2 puffs into the lungs  every 6 (six) hours as needed for wheezing or shortness of breath.   alendronate 70 MG tablet Commonly known as: FOSAMAX Take 70 mg by mouth once a week. Take with a full glass of water on an empty stomach. On Monday.   alum & mag hydroxide-simeth 200-200-20 MG/5ML suspension Commonly known as: MAALOX/MYLANTA Take 30 mLs by mouth every 4 (four) hours as needed for indigestion or heartburn.   Biofreeze 4 % Gel Generic drug: Menthol (Topical Analgesic) Apply topically 4 (four) times daily. To lower back   calcium carbonate 1500 (600 Ca) MG Tabs tablet Commonly known as: OSCAL Take 600 mg of elemental calcium by mouth daily.   camphor-menthol lotion Commonly known as: [employer]  Apply 1 application topically as needed. To arms in am and legs at bed apply to bilateral lower extremities every night at bedtime for itching   . Can also use as needed.   cetaphil cream Apply 1 application topically daily. APPLY  TO BILATERAL ARMS AND LEGS TO MOISTURIZE SKIN   clobetasol cream 0.05 % Commonly known as: TEMOVATE Apply 1 application topically 2 (two) times daily as needed.   divalproex 125 MG DR tablet Commonly known as: DEPAKOTE Take 125 mg by mouth daily.   donepezil 10 MG tablet Commonly known as: ARICEPT Take 10 mg by mouth at bedtime.   famotidine 10 MG tablet Commonly known as: PEPCID Take 10 mg by mouth at bedtime.   furosemide 20 MG tablet Commonly known as: LASIX Take 20 mg by mouth daily.   lactose free nutrition Liqd Take 237 mLs by mouth daily.   losartan 100 MG tablet Commonly known as: COZAAR Take 50 mg by mouth daily. Take 1/2 tablet to equal 50 mg.   memantine 5 MG tablet Commonly known as: NAMENDA Take 5 mg by mouth 2 (two) times daily.   metolazone 2.5 MG tablet Commonly known as: ZAROXOLYN Take 2.5 mg by mouth daily. Mon and Fri   Myrbetriq 25 MG Tb24 tablet Generic drug: mirabegron ER Take 25 mg by mouth daily with breakfast. Prescribed by urology     nitroGLYCERIN 0.4 MG SL tablet Commonly known as: NITROSTAT Place 0.4 mg under the tongue every 5 (five) minutes as needed for chest pain. After taking three times and no relief call MD.   polyethylene glycol 17 g packet Commonly known as: MIRALAX / GLYCOLAX Take 17 g by mouth daily as needed for moderate constipation.   potassium chloride SA 20 MEQ tablet Commonly known as: KLOR-CON Take 40 mEq by mouth daily.   pyridOXINE 100 MG tablet Commonly known as: VITAMIN B-6 Take 100 mg by mouth daily.   sertraline 50 MG tablet Commonly known as: ZOLOFT Take 50 mg by mouth daily.   zinc oxide 20 % ointment Apply 1 application topically as needed for irritation. To buttocks after every incontinent episode and as needed for redness. May keep at bedside      ROS was provided with assistance of staff.  Review of Systems  Constitutional: Negative for activity change, appetite change, chills, diaphoresis, fatigue and fever.  HENT: Positive for hearing loss. Negative for congestion and voice change.   Respiratory: Negative for cough, shortness of breath and wheezing.   Cardiovascular: Positive for leg swelling. Negative for chest pain and palpitations.  Gastrointestinal: Negative for abdominal distention, abdominal pain, constipation, diarrhea, nausea and vomiting.  Genitourinary: Negative for difficulty urinating, dysuria and urgency.  Musculoskeletal: Positive for arthralgias, back pain and gait problem.  Skin: Positive for color change and wound.  Neurological: Negative for dizziness, speech difficulty and headaches.       [place]  Psychiatric/Behavioral: Positive for agitation, behavioral problems, confusion and dysphoric mood. Negative for hallucinations and sleep disturbance. The patient is nervous/anxious.     Immunization History  Administered Date(s) Administered  . HiB (PRP-OMP) 02/12/2004  . Influenza Split 01/11/2012  . Influenza Whole 01/07/2009, 01/15/2018  .  Influenza-Unspecified 01/11/2014, 01/01/2015, 01/29/2016, 02/01/2017  . PPD Test 11/14/2014, 06/03/2015, 06/17/2015  . Pneumococcal Conjugate-13 02/08/2017  . Pneumococcal Polysaccharide-23 04/28/2010  . Tdap 08/24/2011  . Zoster 04/13/2010   Pertinent  Health Maintenance Due  Topic Date Due  . INFLUENZA VACCINE  Completed  . DEXA SCAN  Completed  .  PNA vac Low Risk Adult  Completed   Fall Risk  01/04/2018 12/29/2016 11/02/2016 06/20/2015 05/23/2015  Falls in the past year? No No Yes No No  Comment - - Emmi Telephone Survey: data to providers prior to load - -  Number falls in past yr: - - 2 or more - -  Comment - - Emmi Telephone Survey Actual Response = 90 - -  Injury with Fall? - - Yes - -   Functional Status Survey:    Vitals:   03/27/19 1546  BP: 132/60  Pulse: 76  Resp: 20  Temp: (!) 97.3 F (36.3 C)  SpO2: 96%  Weight: 143 lb 1.6 oz (64.9 kg)  Height: 5\' 5"  (1.651 m)   Body mass index is 23.81 kg/m. Physical Exam Vitals and nursing note reviewed.  Constitutional:      General: She is not in acute distress.    Appearance: Normal appearance. She is not ill-appearing, toxic-appearing or diaphoretic.  HENT:     Head: Normocephalic and atraumatic.     Nose: Nose normal.     Mouth/Throat:     Mouth: Mucous membranes are moist.  Eyes:     Extraocular Movements: Extraocular movements intact.     Conjunctiva/sclera: Conjunctivae normal.     Pupils: Pupils are equal, round, and reactive to light.  Cardiovascular:     Rate and Rhythm: Normal rate and regular rhythm.     Heart sounds: No murmur.  Pulmonary:     Breath sounds: No wheezing, rhonchi or rales.  Abdominal:     General: Bowel sounds are normal.     Palpations: Abdomen is soft.     Tenderness: There is no abdominal tenderness. There is no guarding or rebound.  Musculoskeletal:     Cervical back: Normal range of motion and neck supple.     Right lower leg: Edema present.     Left lower leg: Edema present.      Comments: 1+ edema BLE  Skin:    General: Skin is warm and dry.     Findings: Erythema present.     Comments: Anterior right lower leg peri scratched wound redness, swelling, tenderness, and warmth, thick yellow drainage seen on wound. Multiple scratched injuries in all limbs.   Neurological:     General: No focal deficit present.     Mental Status: She is alert. Mental status is at baseline.     Motor: No weakness.     Coordination: Coordination normal.     Gait: Gait abnormal.     Comments: Oriented to self.   Psychiatric:     Comments: Answered simple questions, followed simple direction during my examination, her tone of voice is stronger than prior.      Labs reviewed: Recent Labs    12/06/18 0000 12/27/18 0000 03/01/19 0000  NA 141 142 143  K 4.6 4.3 4.3  CL 104 106 106  CO2 29 29 30*  BUN 18 20 25*  CREATININE 0.9 1.0 1.0  CALCIUM 10.9 10.4 10.0   Recent Labs    05/19/18 0000 03/01/19 0000  AST 18 15  ALT 16 13  ALKPHOS  --  43  ALBUMIN  --  3.3*   Recent Labs    05/19/18 0000 03/01/19 0000  WBC  --  8.2  NEUTROABS  --  5,756  HGB 13.0 11.3*  HCT 38 35*  PLT 351 365   Lab Results  Component Value Date   TSH 1.25 03/01/2019  Lab Results  Component Value Date   HGBA1C 4.4 11/14/2015   Lab Results  Component Value Date   CHOL 176 05/15/2014   HDL 62 05/15/2014   LDLCALC 95 05/15/2014   LDLDIRECT 114.3 12/08/2007   TRIG 93 05/15/2014   CHOLHDL 2.9 CALC 12/08/2007    Significant Diagnostic Results in last 30 days:  No results found.  Assessment/Plan Cellulitis of right anterior lower leg 03/27/19 anterior right lower leg scratched injury, peri wound redness, warmth, tenderness, Doxycycline 100mg  bid x 7 days, FloraStor bid x 7 days.    Chronic midline low back pain without sciatica Stable, continue Tylenol, w/c for mobility.   Essential hypertension Blood pressure is controlled, continue Losartan 50mg  qd.   PVD (peripheral  vascular disease) (HCC) Chronic edema BLE, continue Furosemide, Metolazone.   Depression, recurrent (HCC) Continue Sertraline, Depakote.  Compulsive skin picking Slightly improving, continue Sertraline, Depakote.   Moderate dementia without behavioral disturbance ([place]) Continue SNF [facility] for safety, care assistance, continue Memantine, Donepezil.      Family/ staff Communication: plan of care reviewed with the patient and charge nurse.   Labs/tests ordered:  none  Time spend 25 minutes.

## 2019-03-28 ENCOUNTER — Encounter: Payer: Self-pay | Admitting: Nurse Practitioner

## 2019-03-28 DIAGNOSIS — Z20828 Contact with and (suspected) exposure to other viral communicable diseases: Secondary | ICD-10-CM | POA: Diagnosis not present

## 2019-03-28 NOTE — Assessment & Plan Note (Signed)
03/27/19 anterior right lower leg scratched injury, peri wound redness, warmth, tenderness, Doxycycline 100mg  bid x 7 days, FloraStor bid x 7 days.

## 2019-03-28 NOTE — Assessment & Plan Note (Signed)
Continue Sertraline, Depakote.

## 2019-03-28 NOTE — Assessment & Plan Note (Signed)
Blood pressure is controlled, continue Losartan 50mg  qd.

## 2019-03-28 NOTE — Assessment & Plan Note (Signed)
Slightly improving, continue Sertraline, Depakote.

## 2019-03-28 NOTE — Assessment & Plan Note (Signed)
Stable, continue Tylenol, w/c for mobility.

## 2019-03-28 NOTE — Assessment & Plan Note (Signed)
Continue SNF [facility] for safety, care assistance, continue Memantine, Donepezil  

## 2019-03-28 NOTE — Assessment & Plan Note (Signed)
Chronic edema BLE, continue Furosemide, Metolazone.

## 2019-03-29 ENCOUNTER — Non-Acute Institutional Stay (SKILLED_NURSING_FACILITY): Payer: Medicare Other | Admitting: Nurse Practitioner

## 2019-03-29 ENCOUNTER — Encounter: Payer: Self-pay | Admitting: Nurse Practitioner

## 2019-03-29 DIAGNOSIS — G8929 Other chronic pain: Secondary | ICD-10-CM

## 2019-03-29 DIAGNOSIS — W19XXXA Unspecified fall, initial encounter: Secondary | ICD-10-CM | POA: Diagnosis not present

## 2019-03-29 DIAGNOSIS — F0390 Unspecified dementia without behavioral disturbance: Secondary | ICD-10-CM | POA: Diagnosis not present

## 2019-03-29 DIAGNOSIS — F339 Major depressive disorder, recurrent, unspecified: Secondary | ICD-10-CM

## 2019-03-29 DIAGNOSIS — M545 Low back pain: Secondary | ICD-10-CM

## 2019-03-29 DIAGNOSIS — F03B0 Unspecified dementia, moderate, without behavioral disturbance, psychotic disturbance, mood disturbance, and anxiety: Secondary | ICD-10-CM

## 2019-03-29 DIAGNOSIS — L03115 Cellulitis of right lower limb: Secondary | ICD-10-CM

## 2019-03-29 NOTE — Assessment & Plan Note (Signed)
Continue SNF [facility] for safety, care assistance, w/c for mobility, continue Memantine, Donepezil.

## 2019-03-29 NOTE — Progress Notes (Signed)
Location:   [facility] Room Number: 3 Place of Service:  SNF (31) Provider: Lennie Odor Jianni Shelden NP  Wadie Mattie X, NP  Patient Care Team: Camari Quintanilla X, NP as PCP - General (Internal Medicine) Claudie Brickhouse X, NP as Nurse Practitioner (Nurse Practitioner) Virgie Dad, MD as Consulting Physician (Internal Medicine)  Extended Emergency Contact Information Primary Emergency Contact: [place] Address: [place], [place] [address] [name] of [place] Phone: [phone] Work Phone: [phone] Mobile Phone: [phone] Relation: Daughter Secondary Emergency Contact: [name] Address: [address] [place] [address] [name] of [place] Phone: [phone] Mobile Phone: [phone] Relation: Son  Code Status: DNR Goals of care: Advanced Directive information Advanced Directives 03/20/2019  Does Patient Have a Medical Advance Directive? Yes  Type of Advance Directive Out of facility DNR (pink MOST or yellow form);Healthcare Power of Attorney  Does patient want to make changes to medical advance directive? No - Patient declined  Copy of [place] in Chart? Yes - validated most recent copy scanned in chart (See row information)  Would patient like information on creating a medical advance directive? -  Pre-existing out of facility DNR order (yellow form or pink MOST form) Yellow form placed in chart (order not valid for inpatient use)     Chief Complaint  Patient presents with  . Acute Visit    fall    HPI:  Pt is a 85 y.o. female seen today for an acute visit for a fall, the patient was lowered to floor from holding onto raling in standing position by CNA when the patient became combative during assistance of re-dressing. The patient resides in SNF [facility] for safety, care assistance, on Memantine 5mg  bid, Donepezil 10mg  qd for memory, her mood is managed with Sertraline 50mg  qd,  Depakote 125mg  qd. The patient uses w/c for mobility, chronic lower back pain is controlled on Tylenol 500mg  tid. She is on 7 day course of Doxy 100mg  bid for right lower leg cellulitis.    Past Medical History:  Diagnosis Date  . Abnormality of gait 04/28/2010   History of fall onto right shoulder-has participated in PT. Uses walker.     . Acute encephalopathy 10/25/14   Occurred when septic  . Acute lower GI bleeding 10/30/2014  . Adjustment disorder with mixed anxiety and depressed mood 06/04/2015   11/15/15 Hgb 12.8, Na 139, K 4.2, Bun 14, creat 0.68, TP 5.7, albumin 3.5, TSH 1.54, Hgb a1c 4.4   . Anemia   . Aortic atherosclerosis ([place]) 10/27/2014  . Asthma   . Cerebral atrophy ([place]) 10/27/2014  . Cerebrovascular disease 10/27/2014   Small vessel disease  . Cervical disc disease   . Chronic lower back pain 06/04/2015   lumbar spondylosis. Regular f/u Dr. French Ana. Receives injections last 01/2013.  05/27/15 Xray left hip/pelvis: spondylotic changes, severe osteoarthritis Continue Tylenol and Tramadol prn for pain, observe the patient, adding Cymbalta may help. Mid to lower back pain more on the right side back, may consider X-ray of thoracic spine. 06/01/15 CT thoracic spine showed no acute changes.  Patient received 10 injection in the back through Dr. Alvester Morin office. She says the previous problems of discomfort in the back and down the leg have completely resolved. Takes Aleve 220mg  bid   . Debility 06/20/2015   11/15/15 Hgb 12.8, Na 139, K  4.2, Bun 14, creat 0.68, TP 5.7, albumin 3.5, TSH 1.54, Hgb a1c 4.4   . Degenerative disc disease, lumbar 10/27/2014  . Depression   . Dermatitis 03/25/2016  . Diarrhea 10/25/14  . Diverticulosis 10/27/2014  . Essential hypertension 09/20/2006   11/15/15 Hgb 12.8, Na 139, K 4.2, Bun 14, creat 0.68, TP 5.7, albumin 3.5, TSH 1.54, Hgb a1c 4.4   . Fall at nursing home 10/25/2014  . GERD (gastroesophageal reflux disease)   . History of cardiovascular disorder  09/20/2006   X 3 in 2000 now on aspirin.    Marland Kitchen Hyperlipidemia 09/20/2006   05/15/14 LDL 95   . Hypertension   . Lumbosacral spondylosis 10/28/2009  . OSTEOPOROSIS 09/20/2006   Noted 2008    . Overactive bladder 11/29/2013   Myrbetriq prescribed by urology.    . Protein calorie malnutrition ([place])    06/06/15 TP 6.0 albumin 2.9  . SCOLIOSIS 10/28/2009  . Scoliosis 10/27/2014  . Thrombocytosis ([place]) 05/31/2015   06/10/15 plt 602 07/23/15 plt 335 11/15/15 Hgb 12.8, Na 139, K 4.2, Bun 14, creat 0.68, TP 5.7, albumin 3.5, TSH 1.54, Hgb a1c 4.4  . TIA (transient ischemic attack) 09/20/2006   Past Surgical History:  Procedure Laterality Date  . ABDOMINAL HYSTERECTOMY    . APPENDECTOMY    . CHOLECYSTECTOMY    . TONSILLECTOMY      Allergies  Allergen Reactions  . Tramadol Other (See Comments)    Hallucinations, agitation/combativeness  . Amoxicillin     Per MAR  . Oxycodone Other (See Comments)    Per MAR   . Sulfamethoxazole     Per MAR   . Penicillins Rash    Allergies as of 03/29/2019      Reactions   Tramadol Other (See Comments)   Hallucinations, agitation/combativeness   Amoxicillin    Per MAR   Oxycodone Other (See Comments)   Per MAR    Sulfamethoxazole    Per MAR    Penicillins Rash      Medication List       Accurate as of March 29, 2019  1:09 PM. If you have any questions, ask your nurse or doctor.        acetaminophen 500 MG tablet Commonly known as: TYLENOL Take 500 mg by mouth 3 (three) times daily.   acetaminophen 500 MG tablet Commonly known as: TYLENOL Take 500 mg by mouth every 8 (eight) hours as needed.   albuterol 108 (90 Base) MCG/ACT inhaler Commonly known as: VENTOLIN HFA Inhale 2 puffs into the lungs every 6 (six) hours as needed for wheezing or shortness of breath.   alendronate 70 MG tablet Commonly known as: FOSAMAX Take 70 mg by mouth once a week. Take with a full glass of water on an empty stomach. On Monday.   alum & mag hydroxide-simeth  200-200-20 MG/5ML suspension Commonly known as: MAALOX/MYLANTA Take 30 mLs by mouth every 4 (four) hours as needed for indigestion or heartburn.   Biofreeze 4 % Gel Generic drug: Menthol (Topical Analgesic) Apply topically 4 (four) times daily. To lower back   calcium carbonate 1500 (600 Ca) MG Tabs tablet Commonly known as: OSCAL Take 600 mg of elemental calcium by mouth daily.   camphor-menthol lotion Commonly known as: SARNA Apply 1 application topically as needed. To arms in am and legs at bed apply to bilateral lower extremities every night at bedtime for itching   . Can also use as needed.   cetaphil cream Apply 1 application  topically daily. APPLY  TO BILATERAL ARMS AND LEGS TO MOISTURIZE SKIN   clobetasol cream 0.05 % Commonly known as: TEMOVATE Apply 1 application topically 2 (two) times daily as needed.   divalproex 125 MG DR tablet Commonly known as: DEPAKOTE Take 125 mg by mouth daily.   donepezil 10 MG tablet Commonly known as: ARICEPT Take 10 mg by mouth at bedtime.   famotidine 10 MG tablet Commonly known as: PEPCID Take 10 mg by mouth at bedtime.   furosemide 20 MG tablet Commonly known as: LASIX Take 20 mg by mouth daily.   lactose free nutrition Liqd Take 237 mLs by mouth daily.   losartan 100 MG tablet Commonly known as: COZAAR Take 50 mg by mouth daily. Take 1/2 tablet to equal 50 mg.   memantine 5 MG tablet Commonly known as: NAMENDA Take 5 mg by mouth 2 (two) times daily.   metolazone 2.5 MG tablet Commonly known as: ZAROXOLYN Take 2.5 mg by mouth daily. Mon and Fri   Myrbetriq 25 MG Tb24 tablet Generic drug: mirabegron ER Take 25 mg by mouth daily with breakfast. Prescribed by urology   nitroGLYCERIN 0.4 MG SL tablet Commonly known as: NITROSTAT Place 0.4 mg under the tongue every 5 (five) minutes as needed for chest pain. After taking three times and no relief call MD.   polyethylene glycol 17 g packet Commonly known as: MIRALAX /  GLYCOLAX Take 17 g by mouth daily as needed for moderate constipation.   potassium chloride SA 20 MEQ tablet Commonly known as: KLOR-CON Take 40 mEq by mouth daily.   pyridOXINE 100 MG tablet Commonly known as: VITAMIN B-6 Take 100 mg by mouth daily.   sertraline 50 MG tablet Commonly known as: ZOLOFT Take 50 mg by mouth daily.   zinc oxide 20 % ointment Apply 1 application topically as needed for irritation. To buttocks after every incontinent episode and as needed for redness. May keep at bedside      ROS was provided with assistance of staff.  Review of Systems  Constitutional: Negative for activity change, appetite change, chills, diaphoresis, fatigue and fever.  HENT: Positive for hearing loss. Negative for congestion and voice change.   Eyes: Negative for visual disturbance.  Respiratory: Negative for cough, shortness of breath and wheezing.   Cardiovascular: Positive for leg swelling.  Gastrointestinal: Negative for abdominal distention, abdominal pain, constipation, diarrhea, nausea and vomiting.  Genitourinary: Negative for difficulty urinating, dysuria and urgency.  Musculoskeletal: Positive for arthralgias, back pain and gait problem.  Skin: Positive for color change and wound.  Neurological: Negative for dizziness, speech difficulty, weakness and headaches.       Dementia  Psychiatric/Behavioral: Positive for agitation, behavioral problems and confusion. Negative for hallucinations and sleep disturbance. The patient is not nervous/anxious.     Immunization History  Administered Date(s) Administered  . HiB (PRP-OMP) 02/12/2004  . Influenza Split 01/11/2012  . Influenza Whole 01/07/2009, 01/15/2018  . Influenza-Unspecified 01/11/2014, 01/01/2015, 01/29/2016, 02/01/2017  . PPD Test 11/14/2014, 06/03/2015, 06/17/2015  . Pneumococcal Conjugate-13 02/08/2017  . Pneumococcal Polysaccharide-23 04/28/2010  . Tdap 08/24/2011  . Zoster 04/13/2010   Pertinent  Health  Maintenance Due  Topic Date Due  . INFLUENZA VACCINE  Completed  . DEXA SCAN  Completed  . PNA vac Low Risk Adult  Completed   Fall Risk  01/04/2018 12/29/2016 11/02/2016 06/20/2015 05/23/2015  Falls in the past year? No No Yes No No  Comment - - Emmi Telephone Survey: data to providers prior  to load - -  Number falls in past yr: - - 2 or more - -  Comment - - Emmi Telephone Survey Actual Response = 90 - -  Injury with Fall? - - Yes - -   Functional Status Survey:    Vitals:   03/29/19 1245  BP: 128/60  Pulse: 78  Resp: 16  Temp: 97.7 F (36.5 C)  SpO2: 93%   There is no height or weight on file to calculate BMI. Physical Exam Vitals and nursing note reviewed.  Constitutional:      General: She is not in acute distress.    Appearance: Normal appearance. She is not ill-appearing, toxic-appearing or diaphoretic.  HENT:     Head: Normocephalic and atraumatic.     Nose: Nose normal.     Mouth/Throat:     Mouth: Mucous membranes are moist.  Eyes:     Extraocular Movements: Extraocular movements intact.     Conjunctiva/sclera: Conjunctivae normal.     Pupils: Pupils are equal, round, and reactive to light.  Cardiovascular:     Rate and Rhythm: Normal rate and regular rhythm.     Heart sounds: No murmur.  Pulmonary:     Breath sounds: No wheezing, rhonchi or rales.  Abdominal:     General: Bowel sounds are normal. There is no distension.     Palpations: Abdomen is soft.     Tenderness: There is no abdominal tenderness. There is no right CVA tenderness, left CVA tenderness, guarding or rebound.  Musculoskeletal:     Cervical back: Normal range of motion and neck supple.     Right lower leg: Edema present.     Left lower leg: Edema present.     Comments: Trace edema BLE  Skin:    General: Skin is warm and dry.     Comments: Anterior right lower leg cellulitis-improving, less scratched injuries in arms and legs.   Neurological:     General: No focal deficit present.      Mental Status: She is alert. Mental status is at baseline.     Motor: No weakness.     Coordination: Coordination normal.     Gait: Gait abnormal.     Comments: Followed simple directions, answered simple questions.   Psychiatric:     Comments: Confused, flat affect.      Labs reviewed: Recent Labs    12/06/18 0000 12/27/18 0000 03/01/19 0000  NA 141 142 143  K 4.6 4.3 4.3  CL 104 106 106  CO2 29 29 30*  BUN 18 20 25*  CREATININE 0.9 1.0 1.0  CALCIUM 10.9 10.4 10.0   Recent Labs    05/19/18 0000 03/01/19 0000  AST 18 15  ALT 16 13  ALKPHOS  --  43  ALBUMIN  --  3.3*   Recent Labs    05/19/18 0000 03/01/19 0000  WBC  --  8.2  NEUTROABS  --  5,756  HGB 13.0 11.3*  HCT 38 35*  PLT 351 365   Lab Results  Component Value Date   TSH 1.25 03/01/2019   Lab Results  Component Value Date   HGBA1C 4.4 11/14/2015   Lab Results  Component Value Date   CHOL 176 05/15/2014   HDL 62 05/15/2014   LDLCALC 95 05/15/2014   LDLDIRECT 114.3 12/08/2007   TRIG 93 05/15/2014   CHOLHDL 2.9 CALC 12/08/2007    Significant Diagnostic Results in last 30 days:  No results found.  Assessment/Plan: Fall Assisted  fall, no apparent injury, lack of safety awareness and increased frailty are contributory  Cellulitis of right anterior lower leg Starting improving, complete 7 day course of Doxy 100mg  bid.   Chronic midline low back pain without sciatica Stable, continue w/c for mobility, continue Tylenol.   Moderate dementia without behavioral disturbance ([place]) Continue SNF [facility] for safety, care assistance, w/c for mobility, continue Memantine, Donepezil.   Depression, recurrent ([place]) Continue to managed mood with Sertraline, Depakote, less skin picking compulsive behaviors noted.     Family/ staff Communication: plan of care reviewed with the patient and charge nurse.   Labs/tests ordered: none  Time spend 25 minutes.

## 2019-03-29 NOTE — Assessment & Plan Note (Signed)
Stable, continue w/c for mobility, continue Tylenol.

## 2019-03-29 NOTE — Assessment & Plan Note (Signed)
Assisted fall, no apparent injury, lack of safety awareness and increased frailty are contributory

## 2019-03-29 NOTE — Assessment & Plan Note (Signed)
Starting improving, complete 7 day course of Doxy 100mg  bid.

## 2019-03-29 NOTE — Assessment & Plan Note (Signed)
Continue to managed mood with Sertraline, Depakote, less skin picking compulsive behaviors noted.

## 2019-03-30 ENCOUNTER — Non-Acute Institutional Stay (SKILLED_NURSING_FACILITY): Payer: Medicare Other | Admitting: Internal Medicine

## 2019-03-30 ENCOUNTER — Encounter: Payer: Self-pay | Admitting: Internal Medicine

## 2019-03-30 DIAGNOSIS — L03115 Cellulitis of right lower limb: Secondary | ICD-10-CM

## 2019-03-30 DIAGNOSIS — F0391 Unspecified dementia with behavioral disturbance: Secondary | ICD-10-CM

## 2019-03-30 DIAGNOSIS — R600 Localized edema: Secondary | ICD-10-CM

## 2019-03-30 LAB — BASIC METABOLIC PANEL
BUN: 24 — AB (ref 4–21)
CO2: 28 — AB (ref 13–22)
Chloride: 103 (ref 99–108)
Creatinine: 0.9 (ref 0.5–1.1)
Glucose: 93
Potassium: 4.1 (ref 3.4–5.3)
Sodium: 141 (ref 137–147)

## 2019-03-30 LAB — HEPATIC FUNCTION PANEL
ALT: 13 (ref 7–35)
AST: 16 (ref 13–35)
Alkaline Phosphatase: 51 (ref 25–125)
Bilirubin, Total: 0.4

## 2019-03-30 LAB — CBC AND DIFFERENTIAL
HCT: 38 (ref 36–46)
Hemoglobin: 12.9 (ref 12.0–16.0)
Platelets: 490 — AB (ref 150–399)
WBC: 9.6

## 2019-03-30 LAB — COMPREHENSIVE METABOLIC PANEL
Albumin: 3.6 (ref 3.5–5.0)
Globulin: 3.1

## 2019-03-30 LAB — CBC: RBC: 4.05 (ref 3.87–5.11)

## 2019-03-30 NOTE — Progress Notes (Signed)
Location:   [facility] Room Number: 3 Place of Service:  SNF (31) Provider:  Veleta Miners MD  [name], [name], NP  Patient Care Team: [name], [name], NP as PCP - General (Internal Medicine) [name], [name], NP as Nurse Practitioner (Nurse Practitioner) Virgie Dad, MD as Consulting Physician (Internal Medicine)  Extended Emergency Contact Information Primary Emergency Contact: [name] Address: [place], [place] [address] [name] of [place] Phone: [phone] Work Phone: [phone] Mobile Phone: [phone] Relation: Daughter Secondary Emergency Contact: [name] Address: [address] [place] [address] [name] of [place] Phone: [phone] Mobile Phone: [phone] Relation: Son  Code Status:   Goals of care: Advanced Directive information Advanced Directives 03/20/2019  Does Patient Have a Medical Advance Directive? Yes  Type of Advance Directive Out of facility DNR (pink MOST or yellow form);Healthcare Power of Attorney  Does patient want to make changes to medical advance directive? No - Patient declined  Copy of [place] in Chart? Yes - validated most recent copy scanned in chart (See row information)  Would patient like information on creating a medical advance directive? -  Pre-existing out of facility DNR order (yellow form or pink MOST form) Yellow form placed in chart (order not valid for inpatient use)     Chief Complaint  Patient presents with  . Acute Visit    Behavior issues    HPI:  Pt is a 85 y.o. female seen today for an acute visit for behavior issues especially at night and lower extremity edema with cellulitis  Patient has h/o Hypertension, Bilateral LE edema, Lower Back Pain, Osteoporosis urinary Incontinence and Dementia  Patient has history of dementia with behavior issues Recently been started on Depakote to help her with  picking on her lower extremities.  Patient continues to have issues with nurses including resisting care  Lower extremity edema with cellulitis On Lasix and metolazone Was started on doxycycline and doing little better  Patient was at her baseline.  She denied any complaints to me.  Past Medical History:  Diagnosis Date  . Abnormality of gait 04/28/2010   History of fall onto right shoulder-has participated in PT. Uses walker.     . Acute encephalopathy 10/25/14   Occurred when septic  . Acute lower GI bleeding 10/30/2014  . Adjustment disorder with mixed anxiety and depressed mood 06/04/2015   11/15/15 Hgb 12.8, Na 139, K 4.2, Bun 14, creat 0.68, TP 5.7, albumin 3.5, TSH 1.54, Hgb a1c 4.4   . Anemia   . Aortic atherosclerosis ([place]) 10/27/2014  . Asthma   . Cerebral atrophy ([place]) 10/27/2014  . Cerebrovascular disease 10/27/2014   Small vessel disease  . Cervical disc disease   . Chronic lower back pain 06/04/2015   lumbar spondylosis. Regular f/u Dr. French Ana. Receives injections last 01/2013.  05/27/15 Xray left hip/pelvis: spondylotic changes, severe osteoarthritis Continue Tylenol and Tramadol prn for pain, observe the patient, adding Cymbalta may help. Mid to lower back pain more on the right side back, may consider X-ray of thoracic spine. 06/01/15 CT thoracic spine showed no acute changes.  Patient received 10 injection in the back through Dr. Alvester Morin office. She says the previous problems of discomfort in the back and down the leg have completely resolved. Takes Aleve 220mg  bid   . Debility 06/20/2015  11/15/15 Hgb 12.8, Na 139, K 4.2, Bun 14, creat 0.68, TP 5.7, albumin 3.5, TSH 1.54, Hgb a1c 4.4   . Degenerative disc disease, lumbar 10/27/2014  . Depression   . Dermatitis 03/25/2016  . Diarrhea 10/25/14  . Diverticulosis 10/27/2014  . Essential hypertension 09/20/2006   11/15/15 Hgb 12.8, Na 139, K 4.2, Bun 14, creat 0.68, TP 5.7, albumin 3.5, TSH 1.54, Hgb a1c 4.4   . Fall at nursing  home 10/25/2014  . GERD (gastroesophageal reflux disease)   . History of cardiovascular disorder 09/20/2006   X 3 in 2000 now on aspirin.    Marland Kitchen Hyperlipidemia 09/20/2006   05/15/14 LDL 95   . Hypertension   . Lumbosacral spondylosis 10/28/2009  . OSTEOPOROSIS 09/20/2006   Noted 2008    . Overactive bladder 11/29/2013   Myrbetriq prescribed by urology.    . Protein calorie malnutrition ([place])    06/06/15 TP 6.0 albumin 2.9  . SCOLIOSIS 10/28/2009  . Scoliosis 10/27/2014  . Thrombocytosis ([place]) 05/31/2015   06/10/15 plt 602 07/23/15 plt 335 11/15/15 Hgb 12.8, Na 139, K 4.2, Bun 14, creat 0.68, TP 5.7, albumin 3.5, TSH 1.54, Hgb a1c 4.4  . TIA (transient ischemic attack) 09/20/2006   Past Surgical History:  Procedure Laterality Date  . ABDOMINAL HYSTERECTOMY    . APPENDECTOMY    . CHOLECYSTECTOMY    . TONSILLECTOMY      Allergies  Allergen Reactions  . Tramadol Other (See Comments)    Hallucinations, agitation/combativeness  . Amoxicillin     Per MAR  . Oxycodone Other (See Comments)    Per MAR   . Sulfamethoxazole     Per MAR   . Penicillins Rash    Allergies as of 03/30/2019      Reactions   Tramadol Other (See Comments)   Hallucinations, agitation/combativeness   Amoxicillin    Per MAR   Oxycodone Other (See Comments)   Per MAR    Sulfamethoxazole    Per MAR    Penicillins Rash      Medication List       Accurate as of March 30, 2019 11:18 AM. If you have any questions, ask your nurse or doctor.        acetaminophen 500 MG tablet Commonly known as: TYLENOL Take 500 mg by mouth 3 (three) times daily.   acetaminophen 500 MG tablet Commonly known as: TYLENOL Take 500 mg by mouth every 8 (eight) hours as needed.   albuterol 108 (90 Base) MCG/ACT inhaler Commonly known as: VENTOLIN HFA Inhale 2 puffs into the lungs every 6 (six) hours as needed for wheezing or shortness of breath.   alendronate 70 MG tablet Commonly known as: FOSAMAX Take 70 mg by mouth once a week.  Take with a full glass of water on an empty stomach. On Monday.   alum & mag hydroxide-simeth 200-200-20 MG/5ML suspension Commonly known as: MAALOX/MYLANTA Take 30 mLs by mouth every 4 (four) hours as needed for indigestion or heartburn.   Biofreeze 4 % Gel Generic drug: Menthol (Topical Analgesic) Apply topically 4 (four) times daily. To lower back   calcium carbonate 1500 (600 Ca) MG Tabs tablet Commonly known as: OSCAL Take 600 mg of elemental calcium by mouth daily.   camphor-menthol lotion Commonly known as: SARNA Apply 1 application topically as needed. To arms in am and legs at bed apply to bilateral lower extremities every night at bedtime for itching   . Can also use as needed.  cetaphil cream Apply 1 application topically daily. APPLY  TO BILATERAL ARMS AND LEGS TO MOISTURIZE SKIN   clobetasol cream 0.05 % Commonly known as: TEMOVATE Apply 1 application topically 2 (two) times daily as needed.   divalproex 125 MG DR tablet Commonly known as: DEPAKOTE Take 125 mg by mouth daily.   donepezil 10 MG tablet Commonly known as: ARICEPT Take 10 mg by mouth at bedtime.   doxycycline 100 MG tablet Commonly known as: VIBRA-TABS Take 100 mg by mouth 2 (two) times daily.   famotidine 10 MG tablet Commonly known as: PEPCID Take 10 mg by mouth at bedtime.   furosemide 20 MG tablet Commonly known as: LASIX Take 20 mg by mouth daily.   lactose free nutrition Liqd Take 237 mLs by mouth daily.   losartan 100 MG tablet Commonly known as: COZAAR Take 50 mg by mouth daily. Take 1/2 tablet to equal 50 mg.   memantine 5 MG tablet Commonly known as: NAMENDA Take 5 mg by mouth 2 (two) times daily.   metolazone 2.5 MG tablet Commonly known as: ZAROXOLYN Take 2.5 mg by mouth daily. Mon and Fri   Myrbetriq 25 MG Tb24 tablet Generic drug: mirabegron ER Take 25 mg by mouth daily with breakfast. Prescribed by urology   nitroGLYCERIN 0.4 MG SL tablet Commonly known as:  NITROSTAT Place 0.4 mg under the tongue every 5 (five) minutes as needed for chest pain. After taking three times and no relief call MD.   polyethylene glycol 17 g packet Commonly known as: MIRALAX / GLYCOLAX Take 17 g by mouth daily as needed for moderate constipation.   potassium chloride SA 20 MEQ tablet Commonly known as: KLOR-CON Take 40 mEq by mouth daily.   pyridOXINE 100 MG tablet Commonly known as: VITAMIN B-6 Take 100 mg by mouth daily.   saccharomyces boulardii 250 MG capsule Commonly known as: FLORASTOR Take 250 mg by mouth 2 (two) times daily.   sertraline 50 MG tablet Commonly known as: ZOLOFT Take 50 mg by mouth daily.   zinc oxide 20 % ointment Apply 1 application topically as needed for irritation. To buttocks after every incontinent episode and as needed for redness. May keep at bedside       Review of Systems  Cardiovascular: Positive for leg swelling.  Musculoskeletal: Negative.   Skin: Positive for wound.  Neurological: Positive for weakness.  Psychiatric/Behavioral: Positive for agitation, behavioral problems and confusion.  All other systems reviewed and are negative.   Immunization History  Administered Date(s) Administered  . HiB (PRP-OMP) 02/12/2004  . Influenza Split 01/11/2012  . Influenza Whole 01/07/2009, 01/15/2018  . Influenza-Unspecified 01/11/2014, 01/01/2015, 01/29/2016, 02/01/2017  . PPD Test 11/14/2014, 06/03/2015, 06/17/2015  . Pneumococcal Conjugate-13 02/08/2017  . Pneumococcal Polysaccharide-23 04/28/2010  . Tdap 08/24/2011  . Zoster 04/13/2010   Pertinent  Health Maintenance Due  Topic Date Due  . INFLUENZA VACCINE  Completed  . DEXA SCAN  Completed  . PNA vac Low Risk Adult  Completed   Fall Risk  01/04/2018 12/29/2016 11/02/2016 06/20/2015 05/23/2015  Falls in the past year? No No Yes No No  Comment - - [facility] Telephone Survey: data to providers prior to load - -  Number falls in past yr: - - 2 or more - -  Comment - -  Emmi Telephone Survey Actual Response = 90 - -  Injury with Fall? - - Yes - -   Functional Status Survey:    Vitals:   03/30/19 1111  BP:  128/60  Pulse: 100  Resp: 16  Temp: 97.6 F (36.4 C)  SpO2: 96%  Weight: 143 lb 1.6 oz (64.9 kg)  Height: 5\' 5"  (1.651 m)   Body mass index is 23.81 kg/m. Physical Exam Vitals reviewed.  Constitutional:      Appearance: Normal appearance.  HENT:     Head: Normocephalic.     Nose: Nose normal.     Mouth/Throat:     Mouth: Mucous membranes are moist.     Pharynx: Oropharynx is clear.  Eyes:     Pupils: Pupils are equal, round, and reactive to light.  Cardiovascular:     Rate and Rhythm: Normal rate and regular rhythm.     Pulses: Normal pulses.  Pulmonary:     Effort: Pulmonary effort is normal.     Breath sounds: Normal breath sounds.  Abdominal:     General: Abdomen is flat. Bowel sounds are normal.     Palpations: Abdomen is soft.  Musculoskeletal:     Cervical back: Neck supple.     Comments: Persistent edema The redness is better.  The vesicles where patient had Picked are healing now  Skin:    General: Skin is warm.  Neurological:     General: No focal deficit present.     Mental Status: She is alert.  Psychiatric:        Mood and Affect: Mood normal.        Thought Content: Thought content normal.     Labs reviewed: Recent Labs    12/06/18 0000 12/27/18 0000 03/01/19 0000  NA 141 142 143  K 4.6 4.3 4.3  CL 104 106 106  CO2 29 29 30*  BUN 18 20 25*  CREATININE 0.9 1.0 1.0  CALCIUM 10.9 10.4 10.0   Recent Labs    05/19/18 0000 03/01/19 0000  AST 18 15  ALT 16 13  ALKPHOS  --  43  ALBUMIN  --  3.3*   Recent Labs    05/19/18 0000 03/01/19 0000  WBC  --  8.2  NEUTROABS  --  5,756  HGB 13.0 11.3*  HCT 38 35*  PLT 351 365   Lab Results  Component Value Date   TSH 1.25 03/01/2019   Lab Results  Component Value Date   HGBA1C 4.4 11/14/2015   Lab Results  Component Value Date   CHOL 176  05/15/2014   HDL 62 05/15/2014   LDLCALC 95 05/15/2014   LDLDIRECT 114.3 12/08/2007   TRIG 93 05/15/2014   CHOLHDL 2.9 CALC 12/08/2007    Significant Diagnostic Results in last 30 days:  No results found.  Assessment/Plan Dementia with behavioral disturbance Increase the Depakote to 125 twice daily Continue Zoloft and Namenda Also start her on Seroquel 12.5 mg twice daily as needed for 2 weeks Will also Discontinue her Myrbetriq  Cellulitis of right anterior lower leg Doing well on the doxycycline  Leg edema We will increase her metolazone to 2.5 mg on Monday Wednesday Friday  Repeat BMP hepatic panel in 2 weeks Other Issues Age-related osteoporosis On Fosamax since 12/2016 Has been of and On on Boniva  before Will repeat DEXA and if it is good will consider stopping Fosmax  Chronic midline low back pain Controlled on Tyelnol  Family/ staff Communication:   Labs/tests ordered:    Total time spent in this patient care encounter was  _25  minutes; greater than 50% of the visit spent counseling patient and staff, reviewing records , Labs and  coordinating care for problems addressed at this encounter.

## 2019-04-04 DIAGNOSIS — Z20828 Contact with and (suspected) exposure to other viral communicable diseases: Secondary | ICD-10-CM | POA: Diagnosis not present

## 2019-04-10 DIAGNOSIS — Z20828 Contact with and (suspected) exposure to other viral communicable diseases: Secondary | ICD-10-CM | POA: Diagnosis not present

## 2019-04-11 DIAGNOSIS — F329 Major depressive disorder, single episode, unspecified: Secondary | ICD-10-CM | POA: Diagnosis not present

## 2019-04-11 DIAGNOSIS — R4182 Altered mental status, unspecified: Secondary | ICD-10-CM | POA: Diagnosis not present

## 2019-04-11 DIAGNOSIS — I10 Essential (primary) hypertension: Secondary | ICD-10-CM | POA: Diagnosis not present

## 2019-04-11 LAB — HEPATIC FUNCTION PANEL
ALT: 10 (ref 7–35)
AST: 14 (ref 13–35)
Alkaline Phosphatase: 46 (ref 25–125)
Bilirubin, Direct: 0.1 (ref 0.01–0.4)
Bilirubin, Total: 0.4

## 2019-04-11 LAB — COMPREHENSIVE METABOLIC PANEL
Albumin: 3.1 — AB (ref 3.5–5.0)
Calcium: 9.6 (ref 8.7–10.7)
Globulin: 2.7

## 2019-04-11 LAB — CBC AND DIFFERENTIAL
HCT: 34 — AB (ref 36–46)
Hemoglobin: 11.5 — AB (ref 12.0–16.0)
Platelets: 387 (ref 150–399)
WBC: 10.9

## 2019-04-11 LAB — BASIC METABOLIC PANEL
BUN: 30 — AB (ref 4–21)
CO2: 31 — AB (ref 13–22)
Chloride: 104 (ref 99–108)
Creatinine: 0.9 (ref 0.5–1.1)
Glucose: 93
Potassium: 4.1 (ref 3.4–5.3)
Sodium: 142 (ref 137–147)

## 2019-04-11 LAB — CBC: RBC: 3.63 — AB (ref 3.87–5.11)

## 2019-04-17 DIAGNOSIS — Z20828 Contact with and (suspected) exposure to other viral communicable diseases: Secondary | ICD-10-CM | POA: Diagnosis not present

## 2019-04-24 ENCOUNTER — Encounter: Payer: Self-pay | Admitting: Nurse Practitioner

## 2019-04-24 ENCOUNTER — Non-Acute Institutional Stay (SKILLED_NURSING_FACILITY): Payer: Medicare Other | Admitting: Nurse Practitioner

## 2019-04-24 DIAGNOSIS — F0390 Unspecified dementia without behavioral disturbance: Secondary | ICD-10-CM

## 2019-04-24 DIAGNOSIS — M159 Polyosteoarthritis, unspecified: Secondary | ICD-10-CM | POA: Diagnosis not present

## 2019-04-24 DIAGNOSIS — F03B0 Unspecified dementia, moderate, without behavioral disturbance, psychotic disturbance, mood disturbance, and anxiety: Secondary | ICD-10-CM

## 2019-04-24 DIAGNOSIS — F339 Major depressive disorder, recurrent, unspecified: Secondary | ICD-10-CM | POA: Diagnosis not present

## 2019-04-24 DIAGNOSIS — S61411A Laceration without foreign body of right hand, initial encounter: Secondary | ICD-10-CM

## 2019-04-24 DIAGNOSIS — I10 Essential (primary) hypertension: Secondary | ICD-10-CM | POA: Diagnosis not present

## 2019-04-24 DIAGNOSIS — Z20828 Contact with and (suspected) exposure to other viral communicable diseases: Secondary | ICD-10-CM | POA: Diagnosis not present

## 2019-04-24 DIAGNOSIS — S61419A Laceration without foreign body of unspecified hand, initial encounter: Secondary | ICD-10-CM | POA: Insufficient documentation

## 2019-04-24 DIAGNOSIS — I739 Peripheral vascular disease, unspecified: Secondary | ICD-10-CM | POA: Diagnosis not present

## 2019-04-24 NOTE — Progress Notes (Signed)
Location:    [facility] Room Number: 3 Place of Service:  SNF (31) Provider: Shelvy Perazzo X, NP  Ely Spragg X, NP  Patient Care Team: Aisia Correira X, NP as PCP - General (Internal Medicine) Star Resler X, NP as Nurse Practitioner (Nurse Practitioner) Virgie Dad, MD as Consulting Physician (Internal Medicine)  Extended Emergency Contact Information Primary Emergency Contact: [place] Address: [place], [place] [address] [name] of [place] Phone: [phone] Work Phone: [phone] Mobile Phone: [phone] Relation: Daughter Secondary Emergency Contact: [name] Address: [address] [place] [address] [name] of [place] Phone: [phone] Mobile Phone: [phone] Relation: Son  Code Status:  DNR Goals of care: Advanced Directive information Advanced Directives 03/20/2019  Does Patient Have a Medical Advance Directive? Yes  Type of Advance Directive Out of facility DNR (pink MOST or yellow form);Healthcare Power of Attorney  Does patient want to make changes to medical advance directive? No - Patient declined  Copy of [place] in Chart? Yes - validated most recent copy scanned in chart (See row information)  Would patient like information on creating a medical advance directive? -  Pre-existing out of facility DNR order (yellow form or pink MOST form) Yellow form placed in chart (order not valid for inpatient use)     Chief Complaint  Patient presents with  . Acute Visit    Right second finger injury    HPI:  Pt is a 86 y.o. female seen today for an acute visit for reported the right 2nd finger was injury on the call bell clip, cleansed, bandaged, no active bleeding or s/s of infection. The patient resides in SNF [facility] for safety, care assistance, on Memantine 5mg  bid, Donepezil 10mg  qd for memory. Her mood is stable, on Sertraline 50mg  qd,  Quetiapine 12.mg q12hr prn, Depakote 125mg  bid. HTN, blood pressure is controlled on Losartan 50mg  qd. Chronic edema trace in legs, on Metolazone 2.5mg  qd 2x/wk, Furosemide 20mg  qd. Lower back pain, stable, on Tylenol 500mg  tid.    Past Medical History:  Diagnosis Date  . Abnormality of gait 04/28/2010   History of fall onto right shoulder-has participated in PT. Uses walker.     . Acute encephalopathy 10/25/14   Occurred when septic  . Acute lower GI bleeding 10/30/2014  . Adjustment disorder with mixed anxiety and depressed mood 06/04/2015   11/15/15 Hgb 12.8, Na 139, K 4.2, Bun 14, creat 0.68, TP 5.7, albumin 3.5, TSH 1.54, Hgb a1c 4.4   . Anemia   . Aortic atherosclerosis ([place]) 10/27/2014  . Asthma   . Cerebral atrophy ([place]) 10/27/2014  . Cerebrovascular disease 10/27/2014   Small vessel disease  . Cervical disc disease   . Chronic lower back pain 06/04/2015   lumbar spondylosis. Regular f/u Dr. French Ana. Receives injections last 01/2013.  05/27/15 Xray left hip/pelvis: spondylotic changes, severe osteoarthritis Continue Tylenol and Tramadol prn for pain, observe the patient, adding Cymbalta may help. Mid to lower back pain more on the right side back, may consider X-ray of thoracic spine. 06/01/15 CT thoracic spine showed no acute changes.  Patient received 10 injection in the back through Dr. Alvester Morin office. She says the previous problems of discomfort in the back and down the leg have completely resolved. Takes Aleve 220mg  bid   . Debility 06/20/2015  11/15/15 Hgb 12.8, Na 139, K 4.2, Bun 14, creat 0.68, TP 5.7, albumin 3.5, TSH 1.54, Hgb a1c 4.4   . Degenerative disc disease, lumbar 10/27/2014  . Depression   . Dermatitis 03/25/2016  . Diarrhea 10/25/14  . Diverticulosis 10/27/2014  . Essential hypertension 09/20/2006   11/15/15 Hgb 12.8, Na 139, K 4.2, Bun 14, creat 0.68, TP 5.7, albumin 3.5, TSH 1.54, Hgb a1c 4.4   . Fall at nursing home 10/25/2014  . GERD (gastroesophageal reflux disease)    . History of cardiovascular disorder 09/20/2006   X 3 in 2000 now on aspirin.    Marland Kitchen Hyperlipidemia 09/20/2006   05/15/14 LDL 95   . Hypertension   . Lumbosacral spondylosis 10/28/2009  . OSTEOPOROSIS 09/20/2006   Noted 2008    . Overactive bladder 11/29/2013   Myrbetriq prescribed by urology.    . Protein calorie malnutrition ([place])    06/06/15 TP 6.0 albumin 2.9  . SCOLIOSIS 10/28/2009  . Scoliosis 10/27/2014  . Thrombocytosis ([place]) 05/31/2015   06/10/15 plt 602 07/23/15 plt 335 11/15/15 Hgb 12.8, Na 139, K 4.2, Bun 14, creat 0.68, TP 5.7, albumin 3.5, TSH 1.54, Hgb a1c 4.4  . TIA (transient ischemic attack) 09/20/2006   Past Surgical History:  Procedure Laterality Date  . ABDOMINAL HYSTERECTOMY    . APPENDECTOMY    . CHOLECYSTECTOMY    . TONSILLECTOMY      Allergies  Allergen Reactions  . Tramadol Other (See Comments)    Hallucinations, agitation/combativeness  . Amoxicillin     Per MAR  . Oxycodone Other (See Comments)    Per MAR   . Sulfamethoxazole     Per MAR   . Penicillins Rash    Allergies as of 04/24/2019      Reactions   Tramadol Other (See Comments)   Hallucinations, agitation/combativeness   Amoxicillin    Per MAR   Oxycodone Other (See Comments)   Per MAR    Sulfamethoxazole    Per MAR    Penicillins Rash      Medication List       Accurate as of April 24, 2019  2:09 PM. If you have any questions, ask your nurse or doctor.        STOP taking these medications   doxycycline 100 MG tablet Commonly known as: VIBRA-TABS Stopped by: Bobbyjo Marulanda X Lorien Shingler, NP   Myrbetriq 25 MG Tb24 tablet Generic drug: mirabegron ER Stopped by: Luwanda Starr X Yukari Flax, NP   saccharomyces boulardii 250 MG capsule Commonly known as: FLORASTOR Stopped by: Tabathia Knoche X Peyten Punches, NP     TAKE these medications   acetaminophen 500 MG tablet Commonly known as: TYLENOL Take 500 mg by mouth 3 (three) times daily.   acetaminophen 500 MG tablet Commonly known as: TYLENOL Take 500 mg by mouth every 8 (eight)  hours as needed.   albuterol 108 (90 Base) MCG/ACT inhaler Commonly known as: VENTOLIN HFA Inhale 2 puffs into the lungs every 6 (six) hours as needed for wheezing or shortness of breath.   alendronate 70 MG tablet Commonly known as: FOSAMAX Take 70 mg by mouth once a week. Take with a full glass of water on an empty stomach. On Monday.   alum & mag hydroxide-simeth 200-200-20 MG/5ML suspension Commonly known as: MAALOX/MYLANTA Take 30 mLs by mouth every 4 (four) hours as needed for indigestion or heartburn.   Biofreeze 4 % Gel Generic drug: Menthol (Topical Analgesic) Apply topically 4 (four) times daily. To lower back  calcium carbonate 1500 (600 Ca) MG Tabs tablet Commonly known as: OSCAL Take 600 mg of elemental calcium by mouth daily.   camphor-menthol lotion Commonly known as: SARNA Apply 1 application topically as needed. To arms in am and legs at bed apply to bilateral lower extremities every night at bedtime for itching   . Can also use as needed.   cetaphil cream Apply 1 application topically daily. APPLY  TO BILATERAL ARMS AND LEGS TO MOISTURIZE SKIN   clobetasol cream 0.05 % Commonly known as: TEMOVATE Apply 1 application topically 2 (two) times daily as needed.   divalproex 125 MG DR tablet Commonly known as: DEPAKOTE Take 125 mg by mouth 2 (two) times daily.   donepezil 10 MG tablet Commonly known as: ARICEPT Take 10 mg by mouth at bedtime.   famotidine 10 MG tablet Commonly known as: PEPCID Take 10 mg by mouth at bedtime.   furosemide 20 MG tablet Commonly known as: LASIX Take 20 mg by mouth daily.   lactose free nutrition Liqd Take 237 mLs by mouth daily.   losartan 100 MG tablet Commonly known as: COZAAR Take 50 mg by mouth daily. Take 1/2 tablet to equal 50 mg.   memantine 5 MG tablet Commonly known as: NAMENDA Take 5 mg by mouth 2 (two) times daily.   metolazone 2.5 MG tablet Commonly known as: ZAROXOLYN Take 2.5 mg by mouth daily. Mon  and Fri   nitroGLYCERIN 0.4 MG SL tablet Commonly known as: NITROSTAT Place 0.4 mg under the tongue every 5 (five) minutes as needed for chest pain. After taking three times and no relief call MD.   polyethylene glycol 17 g packet Commonly known as: MIRALAX / GLYCOLAX Take 17 g by mouth daily as needed for moderate constipation.   potassium chloride SA 20 MEQ tablet Commonly known as: KLOR-CON Take 40 mEq by mouth daily.   pyridOXINE 100 MG tablet Commonly known as: VITAMIN B-6 Take 100 mg by mouth daily.   QUEtiapine 25 MG tablet Commonly known as: SEROQUEL Take 12.5 mg by mouth every 12 (twelve) hours as needed. 1/2 tab=12.5mg    sertraline 50 MG tablet Commonly known as: ZOLOFT Take 50 mg by mouth daily.   zinc oxide 20 % ointment Apply 1 application topically as needed for irritation. To buttocks after every incontinent episode and as needed for redness. May keep at bedside      ROS was provided with assistance of staff.  Review of Systems  Constitutional: Negative for activity change, appetite change, chills, diaphoresis, fatigue and fever.  HENT: Positive for hearing loss. Negative for congestion and voice change.   Eyes: Negative for visual disturbance.  Respiratory: Negative for cough, shortness of breath and wheezing.   Cardiovascular: Positive for leg swelling. Negative for chest pain and palpitations.  Gastrointestinal: Negative for abdominal distention, abdominal pain, constipation, diarrhea, nausea and vomiting.  Genitourinary: Negative for difficulty urinating, dysuria and urgency.  Musculoskeletal: Positive for arthralgias, back pain and gait problem.  Skin: Positive for wound. Negative for color change.  Neurological: Negative for dizziness, speech difficulty, weakness and headaches.       Dementia  Psychiatric/Behavioral: Positive for agitation, behavioral problems and confusion. Negative for hallucinations and sleep disturbance. The patient is not  nervous/anxious.     Immunization History  Administered Date(s) Administered  . HiB (PRP-OMP) 02/12/2004  . Influenza Split 01/11/2012  . Influenza Whole 01/07/2009, 01/15/2018  . Influenza-Unspecified 01/11/2014, 01/01/2015, 01/29/2016, 02/01/2017  . PPD Test 11/14/2014, 06/03/2015, 06/17/2015  .  Pneumococcal Conjugate-13 02/08/2017  . Pneumococcal Polysaccharide-23 04/28/2010  . Tdap 08/24/2011  . Zoster 04/13/2010   Pertinent  Health Maintenance Due  Topic Date Due  . INFLUENZA VACCINE  Completed  . DEXA SCAN  Completed  . PNA vac Low Risk Adult  Completed   Fall Risk  01/04/2018 12/29/2016 11/02/2016 06/20/2015 05/23/2015  Falls in the past year? No No Yes No No  Comment - - Emmi Telephone Survey: data to providers prior to load - -  Number falls in past yr: - - 2 or more - -  Comment - - Emmi Telephone Survey Actual Response = 90 - -  Injury with Fall? - - Yes - -   Functional Status Survey:    Vitals:   04/24/19 1030  BP: (!) 118/58  Pulse: 70  Resp: 18  Temp: 97.8 F (36.6 C)  SpO2: 95%  Weight: 139 lb 12.8 oz (63.4 kg)  Height: 5\' 5"  (1.651 m)   Body mass index is 23.26 kg/m. Physical Exam Vitals and nursing note reviewed.  Constitutional:      General: She is not in acute distress.    Appearance: Normal appearance. She is not ill-appearing, toxic-appearing or diaphoretic.  HENT:     Head: Normocephalic and atraumatic.     Nose: Nose normal.     Mouth/Throat:     Mouth: Mucous membranes are moist.  Eyes:     Extraocular Movements: Extraocular movements intact.     Conjunctiva/sclera: Conjunctivae normal.     Pupils: Pupils are equal, round, and reactive to light.  Cardiovascular:     Rate and Rhythm: Normal rate and regular rhythm.     Heart sounds: No murmur.  Pulmonary:     Breath sounds: No wheezing, rhonchi or rales.  Abdominal:     General: Bowel sounds are normal. There is no distension.     Palpations: Abdomen is soft.     Tenderness: There is  no abdominal tenderness. There is no right CVA tenderness, left CVA tenderness, guarding or rebound.  Musculoskeletal:     Cervical back: Normal range of motion and neck supple.     Right lower leg: Edema present.     Left lower leg: Edema present.     Comments: Trace edema BLE  Skin:    General: Skin is warm and dry.     Comments: The right 2nd finger skin tear is bandaged, no s/s of infection. 2 old skin tears R+L lower shins are healing.   Neurological:     General: No focal deficit present.     Mental Status: She is alert. Mental status is at baseline.     Motor: No weakness.     Coordination: Coordination normal.     Gait: Gait abnormal.     Comments: Oriented to self.  Psychiatric:     Comments: Flat affect, no agitation upon my visiting.      Labs reviewed: Recent Labs    12/27/18 0000 03/01/19 0000 03/30/19 0000 04/11/19 0000  NA 142 143 141 142  K 4.3 4.3 4.1 4.1  CL 106 106 103 104  CO2 29 30* 28* 31*  BUN 20 25* 24* 30*  CREATININE 1.0 1.0 0.9 0.9  CALCIUM 10.4 10.0  --  9.6   Recent Labs    03/01/19 0000 03/30/19 0000 04/11/19 0000  AST 15 16 14   ALT 13 13 10   ALKPHOS 43 51 46  ALBUMIN 3.3* 3.6 3.1*   Recent Labs  03/01/19 0000 03/30/19 0000 04/11/19 0000  WBC 8.2 9.6 10.9  NEUTROABS 5,756  --   --   HGB 11.3* 12.9 11.5*  HCT 35* 38 34*  PLT 365 490* 387   Lab Results  Component Value Date   TSH 1.25 03/01/2019   Lab Results  Component Value Date   HGBA1C 4.4 11/14/2015   Lab Results  Component Value Date   CHOL 176 05/15/2014   HDL 62 05/15/2014   LDLCALC 95 05/15/2014   LDLDIRECT 114.3 12/08/2007   TRIG 93 05/15/2014   CHOLHDL 2.9 CALC 12/08/2007    Significant Diagnostic Results in last 30 days:  No results found.  Assessment/Plan Skin tear of hand without complication [id number] reported the patients right 2nd finger skin tear on call bell clip, cleansed, bandaged 04/24/19 no s/s of infection or dislocation or decreased  ROM, it should heal, observe.   Moderate dementia without behavioral disturbance ([place]) Continue SNF [facility] for safety, care assistance, continue Memantine, Donepezil for memory.   Depression, recurrent ([place]) Her mood is stable, continue Sertraline, Depakote, Quetiapine.   Essential hypertension Blood pressure is controlled, continue Losartan   PVD (peripheral vascular disease) ([facility]) Trace edema BLE, continue Metolazone, Furosemide.   Osteoarthritis (arthritis due to wear and tear of joints) Mostly in lower back, continue Tylenol.      Family/ staff Communication: plan of care reviewed with the patient and charge nurse.   Labs/tests ordered: none  Time spend 25 minutes.

## 2019-04-24 NOTE — Assessment & Plan Note (Signed)
Her mood is stable, continue Sertraline, Depakote, Quetiapine.

## 2019-04-24 NOTE — Assessment & Plan Note (Signed)
04/21/19 reported the patients right 2nd finger skin tear on call bell clip, cleansed, bandaged 04/24/19 no s/s of infection or dislocation or decreased ROM, it should heal, observe.

## 2019-04-24 NOTE — Assessment & Plan Note (Signed)
Continue SNF [facility] for safety, care assistance, continue Memantine, Donepezil for memory.

## 2019-04-24 NOTE — Assessment & Plan Note (Signed)
Blood pressure is controlled, continue Losartan 

## 2019-04-24 NOTE — Assessment & Plan Note (Signed)
Trace edema BLE, continue Metolazone, Furosemide.

## 2019-04-24 NOTE — Assessment & Plan Note (Signed)
Mostly in lower back, continue Tylenol.

## 2019-05-01 ENCOUNTER — Non-Acute Institutional Stay (SKILLED_NURSING_FACILITY): Payer: Medicare Other | Admitting: Nurse Practitioner

## 2019-05-01 ENCOUNTER — Encounter: Payer: Self-pay | Admitting: Nurse Practitioner

## 2019-05-01 DIAGNOSIS — B379 Candidiasis, unspecified: Secondary | ICD-10-CM | POA: Diagnosis not present

## 2019-05-01 DIAGNOSIS — G8929 Other chronic pain: Secondary | ICD-10-CM

## 2019-05-01 DIAGNOSIS — M545 Low back pain, unspecified: Secondary | ICD-10-CM

## 2019-05-01 DIAGNOSIS — I739 Peripheral vascular disease, unspecified: Secondary | ICD-10-CM | POA: Diagnosis not present

## 2019-05-01 DIAGNOSIS — F0390 Unspecified dementia without behavioral disturbance: Secondary | ICD-10-CM

## 2019-05-01 DIAGNOSIS — I10 Essential (primary) hypertension: Secondary | ICD-10-CM

## 2019-05-01 DIAGNOSIS — Z20828 Contact with and (suspected) exposure to other viral communicable diseases: Secondary | ICD-10-CM | POA: Diagnosis not present

## 2019-05-01 DIAGNOSIS — F03B0 Unspecified dementia, moderate, without behavioral disturbance, psychotic disturbance, mood disturbance, and anxiety: Secondary | ICD-10-CM

## 2019-05-01 DIAGNOSIS — K219 Gastro-esophageal reflux disease without esophagitis: Secondary | ICD-10-CM | POA: Diagnosis not present

## 2019-05-01 DIAGNOSIS — F339 Major depressive disorder, recurrent, unspecified: Secondary | ICD-10-CM

## 2019-05-01 NOTE — Progress Notes (Signed)
Location:  [facility] Room Number: 3-A Place of Service:  SNF (31) Provider:  Estanislado Surgeon X, NP  Patient Care Team: Tharon Bomar X, NP as PCP - General (Internal Medicine) Marleigh Kaylor X, NP as Nurse Practitioner (Nurse Practitioner) Virgie Dad, MD as Consulting Physician (Internal Medicine)  Extended Emergency Contact Information Primary Emergency Contact: Maxwell Address: [place], [place] [address] [name] of [place] Phone: [phone] Work Phone: [phone] Mobile Phone: [phone] Relation: Daughter Secondary Emergency Contact: [name] Address: [address] [place] [address] [name] of [place] Phone: [phone] Mobile Phone: [phone] Relation: Son  Code Status:  DNR Goals of care: Advanced Directive information Advanced Directives 05/01/2019  Does Patient Have a Medical Advance Directive? Yes  Type of Advance Directive Out of facility DNR (pink MOST or yellow form);Living will;Healthcare Power of Attorney  Does patient want to make changes to medical advance directive? No - Patient declined  Copy of [place] in Chart? Yes - validated most recent copy scanned in chart (See row information)  Would patient like information on creating a medical advance directive? -  Pre-existing out of facility DNR order (yellow form or pink MOST form) Yellow form placed in chart (order not valid for inpatient use)     Chief Complaint  Patient presents with  . Acute Visit    Patient is seen for agitation    HPI:  Pt is a 86 y.o. female seen today for an acute visit for yelling, she was found to have excoriated buttocks, cried out when incontinent care provided. Hx of dementia, resides in SN[facility] for safety, care assistance, w/c for mobility, staff reported the patient's decreased oral intake in the past a couple of days, on Memantine '5mg'$  bid,  Donepezil  '10mg'$  qd for memory. She denied abd pain, nausea, vomiting, or constipation. Chronic lower back pain, stable, on Tylenol '500mg'$  tid. Her mood is managed on Depakote '125mg'$  bid, Quetiapine 12.mg q12hr prn, Sertraline '50mg'$  qd.  GERD, stable, on Famotidine '10mg'$  qd. Chronic edema BLE, on Furosemide '20mg'$  qd, Metolazone 2.'5mg'$  qd x2/wk. HTN, blood pressure is controlled on Losartan '50mg'$  qd.    Past Medical History:  Diagnosis Date  . Abnormality of gait 04/28/2010   History of fall onto right shoulder-has participated in PT. Uses walker.     . Acute encephalopathy 10/25/14   Occurred when septic  . Acute lower GI bleeding 10/30/2014  . Adjustment disorder with mixed anxiety and depressed mood 06/04/2015   11/15/15 Hgb 12.8, Na 139, K 4.2, Bun 14, creat 0.68, TP 5.7, albumin 3.5, TSH 1.54, Hgb a1c 4.4   . Anemia   . Aortic atherosclerosis ([place]) 10/27/2014  . Asthma   . Cerebral atrophy ([place]) 10/27/2014  . Cerebrovascular disease 10/27/2014   Small vessel disease  . Cervical disc disease   . Chronic lower back pain 06/04/2015   lumbar spondylosis. Regular f/u Dr. French Ana. Receives injections last 01/2013.  05/27/15 Xray left hip/pelvis: spondylotic changes, severe osteoarthritis Continue Tylenol and Tramadol prn for pain, observe the patient, adding Cymbalta may help. Mid to lower back pain more on the right side back, may consider X-ray of thoracic spine. 06/01/15 CT thoracic spine showed no acute changes.  Patient received 10 injection in the back through Dr. Alvester Morin office. She says the previous problems of discomfort in  the back and down the leg have completely resolved. Takes Aleve '220mg'$  bid   . Debility 06/20/2015   11/15/15 Hgb 12.8, Na 139, K 4.2, Bun 14, creat 0.68, TP 5.7, albumin 3.5, TSH 1.54, Hgb a1c 4.4   . Degenerative disc disease, lumbar 10/27/2014  . Depression   . Dermatitis 03/25/2016  . Diarrhea 10/25/14  . Diverticulosis 10/27/2014  . Essential hypertension 09/20/2006   11/15/15 Hgb 12.8, Na  139, K 4.2, Bun 14, creat 0.68, TP 5.7, albumin 3.5, TSH 1.54, Hgb a1c 4.4   . Fall at nursing home 10/25/2014  . GERD (gastroesophageal reflux disease)   . History of cardiovascular disorder 09/20/2006   X 3 in 2000 now on aspirin.    Marland Kitchen Hyperlipidemia 09/20/2006   05/15/14 LDL 95   . Hypertension   . Lumbosacral spondylosis 10/28/2009  . OSTEOPOROSIS 09/20/2006   Noted 2008    . Overactive bladder 11/29/2013   Myrbetriq prescribed by urology.    . Protein calorie malnutrition ([place])    06/06/15 TP 6.0 albumin 2.9  . SCOLIOSIS 10/28/2009  . Scoliosis 10/27/2014  . Thrombocytosis ([place]) 05/31/2015   06/10/15 plt 602 07/23/15 plt 335 11/15/15 Hgb 12.8, Na 139, K 4.2, Bun 14, creat 0.68, TP 5.7, albumin 3.5, TSH 1.54, Hgb a1c 4.4  . TIA (transient ischemic attack) 09/20/2006   Past Surgical History:  Procedure Laterality Date  . ABDOMINAL HYSTERECTOMY    . APPENDECTOMY    . CHOLECYSTECTOMY    . TONSILLECTOMY      Allergies  Allergen Reactions  . Tramadol Other (See Comments)    Hallucinations, agitation/combativeness  . Amoxicillin     Per MAR  . Oxycodone Other (See Comments)    Per MAR   . Sulfamethoxazole     Per MAR   . Penicillins Rash    Outpatient Encounter Medications as of 05/01/2019  Medication Sig  . acetaminophen (TYLENOL) 500 MG tablet Take 500 mg by mouth 3 (three) times daily.  Marland Kitchen acetaminophen (TYLENOL) 500 MG tablet Take 500 mg by mouth every 8 (eight) hours as needed.  Marland Kitchen albuterol (PROVENTIL HFA;VENTOLIN HFA) 108 (90 BASE) MCG/ACT inhaler Inhale 2 puffs into the lungs every 6 (six) hours as needed for wheezing or shortness of breath.  Marland Kitchen alendronate (FOSAMAX) 70 MG tablet Take 70 mg by mouth once a week. Take with a full glass of water on an empty stomach. On Monday.  Marland Kitchen alum & mag hydroxide-simeth (MAALOX/MYLANTA) 200-200-20 MG/5ML suspension Take 30 mLs by mouth every 4 (four) hours as needed for indigestion or heartburn.   . calcium carbonate (OSCAL) 1500 (600 Ca) MG TABS  tablet Take 600 mg of elemental calcium by mouth daily.   . camphor-menthol (SARNA) lotion Apply 1 application topically as needed. To arms in am and legs at bed apply to bilateral lower extremities every night at bedtime for itching   . Can also use as needed.  . cetaphil (CETAPHIL) cream Apply 1 application topically daily. APPLY  TO BILATERAL ARMS AND LEGS TO MOISTURIZE SKIN  . clobetasol cream (TEMOVATE) 6.96 % Apply 1 application topically 2 (two) times daily as needed.  . divalproex (DEPAKOTE) 125 MG DR tablet Take 125 mg by mouth 2 (two) times daily.   Marland Kitchen donepezil (ARICEPT) 10 MG tablet Take 10 mg by mouth at bedtime.  . famotidine (PEPCID) 10 MG tablet Take 10 mg by mouth at bedtime.  . furosemide (LASIX) 20 MG tablet Take 20 mg by mouth daily.   Marland Kitchen  lactose free nutrition (BOOST) LIQD Take 237 mLs by mouth 2 (two) times daily.   Marland Kitchen losartan (COZAAR) 50 MG tablet Take 50 mg by mouth daily.  . memantine (NAMENDA) 5 MG tablet Take 5 mg by mouth 2 (two) times daily.   . Menthol, Topical Analgesic, (BIOFREEZE) 4 % GEL Apply topically 4 (four) times daily. To lower back  . metolazone (ZAROXOLYN) 2.5 MG tablet Take 2.5 mg by mouth daily. Mon and Fri   . nitroGLYCERIN (NITROSTAT) 0.4 MG SL tablet Place 0.4 mg under the tongue every 5 (five) minutes as needed for chest pain. After taking three times and no relief call MD.  . polyethylene glycol (MIRALAX / GLYCOLAX) packet Take 17 g by mouth daily as needed for moderate constipation.   . potassium chloride SA (K-DUR) 20 MEQ tablet Take 40 mEq by mouth daily. Take 2 tabs to = 40 mEq  . pyridOXINE (VITAMIN B-6) 100 MG tablet Take 100 mg by mouth daily.  . QUEtiapine (SEROQUEL) 25 MG tablet Take 12.5 mg by mouth every 12 (twelve) hours as needed. 1/2 tab=12.'5mg'$    . sertraline (ZOLOFT) 50 MG tablet Take 50 mg by mouth daily.   Marland Kitchen zinc oxide 20 % ointment Apply 1 application topically as needed for irritation. To buttocks after every incontinent episode and  as needed for redness. May keep at bedside  . [DISCONTINUED] losartan (COZAAR) 100 MG tablet Take 50 mg by mouth daily. Take 1/2 tablet to equal 50 mg.   No facility-administered encounter medications on file as of 05/01/2019.   ROS was provided with assistance of staff.  Review of Systems  Constitutional: Positive for appetite change and fatigue. Negative for activity change, chills, diaphoresis and fever.  HENT: Positive for hearing loss. Negative for congestion and voice change.   Eyes: Negative for visual disturbance.  Respiratory: Negative for cough, shortness of breath and wheezing.   Cardiovascular: Positive for leg swelling. Negative for chest pain and palpitations.  Gastrointestinal: Negative for abdominal distention, abdominal pain, constipation, diarrhea, nausea and vomiting.  Genitourinary: Negative for difficulty urinating, dysuria and urgency.  Musculoskeletal: Positive for arthralgias, back pain and gait problem.  Skin: Positive for rash.       Excoriated buttocks   Neurological: Negative for dizziness, speech difficulty, weakness and headaches.       Dementia  Psychiatric/Behavioral: Positive for agitation, behavioral problems and confusion. Negative for hallucinations and sleep disturbance. The patient is nervous/anxious.     Immunization History  Administered Date(s) Administered  . HiB (PRP-OMP) 02/12/2004  . Influenza Split 01/11/2012  . Influenza Whole 01/07/2009, 01/15/2018  . Influenza-Unspecified 01/11/2014, 01/01/2015, 01/29/2016, 02/01/2017  . PPD Test 11/14/2014, 06/03/2015, 06/17/2015  . Pneumococcal Conjugate-13 02/08/2017  . Pneumococcal Polysaccharide-23 04/28/2010  . Tdap 08/24/2011  . Zoster 04/13/2010   Pertinent  Health Maintenance Due  Topic Date Due  . INFLUENZA VACCINE  Completed  . DEXA SCAN  Completed  . PNA vac Low Risk Adult  Completed   Fall Risk  01/04/2018 12/29/2016 11/02/2016 06/20/2015 05/23/2015  Falls in the past year? No No Yes No  No  Comment - - Emmi Telephone Survey: data to providers prior to load - -  Number falls in past yr: - - 2 or more - -  Comment - - Emmi Telephone Survey Actual Response = 90 - -  Injury with Fall? - - Yes - -   Functional Status Survey:    Vitals:   05/01/19 1452  BP: (!) 118/58  Pulse:  70  Resp: 18  Temp: (!) 96.9 F (36.1 C)  TempSrc: Oral  SpO2: 94%  Weight: 139 lb 12.8 oz (63.4 kg)  Height: '5\' 5"'$  (1.651 m)   Body mass index is 23.26 kg/m. Physical Exam Vitals and nursing note reviewed.  Constitutional:      General: She is not in acute distress.    Appearance: Normal appearance. She is not ill-appearing, toxic-appearing or diaphoretic.  HENT:     Head: Normocephalic and atraumatic.     Nose: Nose normal.     Mouth/Throat:     Mouth: Mucous membranes are dry.  Eyes:     Extraocular Movements: Extraocular movements intact.     Conjunctiva/sclera: Conjunctivae normal.     Pupils: Pupils are equal, round, and reactive to light.  Cardiovascular:     Rate and Rhythm: Normal rate and regular rhythm.     Heart sounds: No murmur.  Pulmonary:     Breath sounds: No wheezing, rhonchi or rales.  Abdominal:     General: Bowel sounds are normal. There is no distension.     Palpations: Abdomen is soft.     Tenderness: There is no abdominal tenderness. There is no right CVA tenderness, left CVA tenderness, guarding or rebound.  Musculoskeletal:     Cervical back: Normal range of motion and neck supple.     Right lower leg: Edema present.     Left lower leg: Edema present.     Comments: Trace edema BLE  Skin:    General: Skin is warm and dry.     Findings: Erythema and rash present.     Comments: Excoriated buttocks.   Neurological:     General: No focal deficit present.     Mental Status: She is alert. Mental status is at baseline.     Motor: No weakness.     Coordination: Coordination normal.     Gait: Gait abnormal.     Comments: Oriented to self.   Psychiatric:      Comments: Flat affect     Labs reviewed: Recent Labs    12/27/18 0000 12/27/18 0000 03/01/19 0000 03/30/19 0000 04/11/19 0000  NA 142   < > 143 141 142  K 4.3   < > 4.3 4.1 4.1  CL 106  --  106 103 104  CO2 29  --  30* 28* 31*  BUN 20   < > 25* 24* 30*  CREATININE 1.0   < > 1.0 0.9 0.9  CALCIUM 10.4  --  10.0  --  9.6   < > = values in this interval not displayed.   Recent Labs    03/01/19 0000 03/30/19 0000 04/11/19 0000  AST '15 16 14  '$ ALT '13 13 10  '$ ALKPHOS 43 51 46  ALBUMIN 3.3* 3.6 3.1*   Recent Labs    03/01/19 0000 03/30/19 0000 04/11/19 0000  WBC 8.2 9.6 10.9  NEUTROABS 5,756  --   --   HGB 11.3* 12.9 11.5*  HCT 35* 38 34*  PLT 365 490* 387   Lab Results  Component Value Date   TSH 1.25 03/01/2019   Lab Results  Component Value Date   HGBA1C 4.4 11/14/2015   Lab Results  Component Value Date   CHOL 176 05/15/2014   HDL 62 05/15/2014   LDLCALC 95 05/15/2014   LDLDIRECT 114.3 12/08/2007   TRIG 93 05/15/2014   CHOLHDL 2.9 CALC 12/08/2007    Significant Diagnostic Results in last 30 days:  No results found.  Assessment/Plan Candidiasis R+L buttocks, excoriated rash, moist and heat are contributory, will apply 0.5% Triamcinolone cream, Nystatin cream bid x 10 days, observe.   Moderate dementia without behavioral disturbance (HCC) Yelling out and agitation may be related to pain/discomfort of excoriated buttock, will observe, continue Memantine, Donepezil.   Depression, recurrent ([place]) Yelling out, agitation noted, will continue prn Quetiapine, continue Depakote bid, Sertraline '50mg'$  qd.   Chronic midline low back pain without sciatica Continue Tylenol for lower back pain and pain related to excoriated buttocks.   Essential hypertension Blood pressure is controlled, continue Losartan  PVD (peripheral vascular disease) (HCC) Trace edema BLE, continue Furosemide, Metolazone, will update CBC/diff, CMP/eGFR in setting of decreased oral  intake.   GERD (gastroesophageal reflux disease) Stable, continue Famotidine.      Family/ staff Communication: plan of care reviewed with the patient and charge nurse.   Labs/tests ordered:  CBC/diff, CMP/eGFR  Time spend 25 minutes.

## 2019-05-02 ENCOUNTER — Encounter: Payer: Self-pay | Admitting: Nurse Practitioner

## 2019-05-02 DIAGNOSIS — B379 Candidiasis, unspecified: Secondary | ICD-10-CM | POA: Insufficient documentation

## 2019-05-02 DIAGNOSIS — E876 Hypokalemia: Secondary | ICD-10-CM | POA: Diagnosis not present

## 2019-05-02 LAB — CBC AND DIFFERENTIAL
HCT: 33 — AB (ref 36–46)
Hemoglobin: 11.1 — AB (ref 12.0–16.0)
Neutrophils Absolute: 16387
Platelets: 544 — AB (ref 150–399)
WBC: 18.6

## 2019-05-02 LAB — BASIC METABOLIC PANEL
BUN: 48 — AB (ref 4–21)
BUN: 48 — AB (ref 4–21)
CO2: 26 — AB (ref 13–22)
CO2: 26 — AB (ref 13–22)
Chloride: 104 (ref 99–108)
Chloride: 104 (ref 99–108)
Creatinine: 1.5 — AB (ref 0.5–1.1)
Creatinine: 1.5 — AB (ref 0.5–1.1)
Glucose: 97
Glucose: 97
Potassium: 4.4 (ref 3.4–5.3)
Potassium: 4.4 (ref 3.4–5.3)
Sodium: 141 (ref 137–147)
Sodium: 141 (ref 137–147)

## 2019-05-02 LAB — CBC: RBC: 3.61 — AB (ref 3.87–5.11)

## 2019-05-02 LAB — COMPREHENSIVE METABOLIC PANEL
Albumin: 2.8 — AB (ref 3.5–5.0)
Calcium: 10.6 (ref 8.7–10.7)
Globulin: 3.1

## 2019-05-02 LAB — HEPATIC FUNCTION PANEL
ALT: 11 (ref 7–35)
AST: 15 (ref 13–35)
Alkaline Phosphatase: 54 (ref 25–125)
Bilirubin, Total: 0.3

## 2019-05-02 NOTE — Assessment & Plan Note (Signed)
Blood pressure is controlled, continue Losartan

## 2019-05-02 NOTE — Assessment & Plan Note (Signed)
R+L buttocks, excoriated rash, moist and heat are contributory, will apply 0.5% Triamcinolone cream, Nystatin cream bid x 10 days, observe.

## 2019-05-02 NOTE — Assessment & Plan Note (Signed)
Yelling out, agitation noted, will continue prn Quetiapine, continue Depakote bid, Sertraline 50mg  qd.

## 2019-05-02 NOTE — Assessment & Plan Note (Signed)
Yelling out and agitation may be related to pain/discomfort of excoriated buttock, will observe, continue Memantine, Donepezil.

## 2019-05-02 NOTE — Assessment & Plan Note (Signed)
Continue Tylenol for lower back pain and pain related to excoriated buttocks.

## 2019-05-02 NOTE — Assessment & Plan Note (Signed)
Trace edema BLE, continue Furosemide, Metolazone, will update CBC/diff, CMP/eGFR in setting of decreased oral intake.

## 2019-05-02 NOTE — Assessment & Plan Note (Signed)
Stable, continue Famotidine.  

## 2019-05-03 ENCOUNTER — Non-Acute Institutional Stay (SKILLED_NURSING_FACILITY): Payer: Medicare Other | Admitting: Nurse Practitioner

## 2019-05-03 ENCOUNTER — Encounter: Payer: Self-pay | Admitting: Nurse Practitioner

## 2019-05-03 DIAGNOSIS — F0390 Unspecified dementia without behavioral disturbance: Secondary | ICD-10-CM

## 2019-05-03 DIAGNOSIS — I10 Essential (primary) hypertension: Secondary | ICD-10-CM | POA: Diagnosis not present

## 2019-05-03 DIAGNOSIS — D72825 Bandemia: Secondary | ICD-10-CM

## 2019-05-03 DIAGNOSIS — R509 Fever, unspecified: Secondary | ICD-10-CM | POA: Diagnosis not present

## 2019-05-03 DIAGNOSIS — Z7189 Other specified counseling: Secondary | ICD-10-CM

## 2019-05-03 DIAGNOSIS — R350 Frequency of micturition: Secondary | ICD-10-CM | POA: Diagnosis not present

## 2019-05-03 DIAGNOSIS — N179 Acute kidney failure, unspecified: Secondary | ICD-10-CM | POA: Diagnosis not present

## 2019-05-03 DIAGNOSIS — I739 Peripheral vascular disease, unspecified: Secondary | ICD-10-CM

## 2019-05-03 DIAGNOSIS — R195 Other fecal abnormalities: Secondary | ICD-10-CM | POA: Insufficient documentation

## 2019-05-03 DIAGNOSIS — D72829 Elevated white blood cell count, unspecified: Secondary | ICD-10-CM | POA: Insufficient documentation

## 2019-05-03 DIAGNOSIS — R309 Painful micturition, unspecified: Secondary | ICD-10-CM | POA: Diagnosis not present

## 2019-05-03 DIAGNOSIS — M159 Polyosteoarthritis, unspecified: Secondary | ICD-10-CM

## 2019-05-03 DIAGNOSIS — F03B0 Unspecified dementia, moderate, without behavioral disturbance, psychotic disturbance, mood disturbance, and anxiety: Secondary | ICD-10-CM

## 2019-05-03 NOTE — Assessment & Plan Note (Signed)
Mostly lower back pain, controlled, continue Tylenol.

## 2019-05-03 NOTE — Assessment & Plan Note (Signed)
05/02/19 wbc 18.6, Hgb 11.1, plt 544, neutrophils 88.1, will obtain UA C/S, CXR to identify source of the infection, Rocephin 1gm IM qd x 7 days.

## 2019-05-03 NOTE — Progress Notes (Signed)
Location:   [facility] Room Number: 3 Place of Service:  SNF (31) Provider: [name], Man, NP  [name], [name], NP  Patient Care Team: [name], [name], NP as PCP - General (Internal Medicine) [name], [name], NP as Nurse Practitioner (Nurse Practitioner) Virgie Dad, MD as Consulting Physician (Internal Medicine)  Extended Emergency Contact Information Primary Emergency Contact: Le[place]y Address: [place],  [address] [name] of [place] Phone: [phone] Work Phone: [phone] Mobile Phone: [phone] Relation: Daughter Secondary Emergency Contact: [name] Address: [address] [place] [address] [name] of [place] Phone: [phone] Mobile Phone: [phone] Relation: Son  Code Status:  DNR Goals of care: Advanced Directive information Advanced Directives 05/03/2019  Does Patient Have a Medical Advance Directive? Yes  Type of Paramedic of [place];Living will;Out of facility DNR (pink MOST or yellow form)  Does patient want to make changes to medical advance directive? No - Patient declined  Copy of [place] in Chart? Yes - validated most recent copy scanned in chart (See row information)  Would patient like information on creating a medical advance directive? -  Pre-existing out of facility DNR order (yellow form or pink MOST form) Pink MOST/Yellow Form most recent copy in chart - Physician notified to receive inpatient order     Chief Complaint  Patient presents with  . Acute Visit    Renal Injury/Leukocytosis    HPI:  Pt is a 86 y.o. female seen today for an acute visit for decreased oral intake, loose stools, elevated wbc, Bun/creat. The patient is alert, but weak in general, stayed in bed today. She is afebrile, no noted cough, SOB, or O2 desaturation. She denied nausea, vomiting, abd pain, but some loose stools.  Hx of dementia, resides in SNF [facility] for safety, care assistance, on Memantine, Donepezil for memory. Chronic edema BLE, not apparent today, on Furosemide, Metolazone. Lower back pain, stable, on Tylenol '500mg'$  tid. HTN, blood pressure is controlled on Losartan '50mg'$  qd.   Past Medical History:  Diagnosis Date  . Abnormality of gait 04/28/2010   History of fall onto right shoulder-has participated in PT. Uses walker.     . Acute encephalopathy 10/25/14   Occurred when septic  . Acute lower GI bleeding 10/30/2014  . Adjustment disorder with mixed anxiety and depressed mood 06/04/2015   11/15/15 Hgb 12.8, Na 139, K 4.2, Bun 14, creat 0.68, TP 5.7, albumin 3.5, TSH 1.54, Hgb a1c 4.4   . Anemia   . Aortic atherosclerosis ([place]) 10/27/2014  . Asthma   . Cerebral atrophy ([place]) 10/27/2014  . Cerebrovascular disease 10/27/2014   Small vessel disease  . Cervical disc disease   . Chronic lower back pain 06/04/2015   lumbar spondylosis. Regular f/u Dr. French Ana. Receives injections last 01/2013.  05/27/15 Xray left hip/pelvis: spondylotic changes, severe osteoarthritis Continue Tylenol and Tramadol prn for pain, observe the patient, adding Cymbalta may help. Mid to lower back pain more on the right side back, may consider X-ray of thoracic spine. 06/01/15 CT thoracic spine showed no acute changes.  Patient received 10 injection in the back through Dr. Alvester Morin office. She says the previous problems of discomfort in the back and down the leg have completely resolved. Takes Aleve '220mg'$  bid   . Debility 06/20/2015   11/15/15 Hgb 12.8, Na 139,  K 4.2, Bun 14, creat 0.68, TP 5.7, albumin 3.5, TSH 1.54, Hgb a1c 4.4   . Degenerative disc disease, lumbar 10/27/2014  . Depression   . Dermatitis 03/25/2016  . Diarrhea 10/25/14  . Diverticulosis 10/27/2014  . Essential hypertension 09/20/2006   11/15/15 Hgb 12.8, Na 139, K 4.2, Bun 14, creat 0.68, TP 5.7, albumin 3.5, TSH 1.54, Hgb a1c 4.4   . Fall at nursing home 10/25/2014  .  GERD (gastroesophageal reflux disease)   . History of cardiovascular disorder 09/20/2006   X 3 in 2000 now on aspirin.    Marland Kitchen Hyperlipidemia 09/20/2006   05/15/14 LDL 95   . Hypertension   . Lumbosacral spondylosis 10/28/2009  . OSTEOPOROSIS 09/20/2006   Noted 2008    . Overactive bladder 11/29/2013   Myrbetriq prescribed by urology.    . Protein calorie malnutrition ([place])    06/06/15 TP 6.0 albumin 2.9  . SCOLIOSIS 10/28/2009  . Scoliosis 10/27/2014  . Thrombocytosis ([place]) 05/31/2015   06/10/15 plt 602 07/23/15 plt 335 11/15/15 Hgb 12.8, Na 139, K 4.2, Bun 14, creat 0.68, TP 5.7, albumin 3.5, TSH 1.54, Hgb a1c 4.4  . TIA (transient ischemic attack) 09/20/2006   Past Surgical History:  Procedure Laterality Date  . ABDOMINAL HYSTERECTOMY    . APPENDECTOMY    . CHOLECYSTECTOMY    . TONSILLECTOMY      Allergies  Allergen Reactions  . Tramadol Other (See Comments)    Hallucinations, agitation/combativeness  . Amoxicillin     Per MAR  . Oxycodone Other (See Comments)    Per MAR   . Sulfamethoxazole     Per MAR   . Penicillins Rash    Allergies as of 05/03/2019      Reactions   Tramadol Other (See Comments)   Hallucinations, agitation/combativeness   Amoxicillin    Per MAR   Oxycodone Other (See Comments)   Per MAR    Sulfamethoxazole    Per MAR    Penicillins Rash      Medication List       Accurate as of May 03, 2019  4:22 PM. If you have any questions, ask your nurse or doctor.        STOP taking these medications   QUEtiapine 25 MG tablet Commonly known as: SEROQUEL Stopped by: [name] [name], NP     TAKE these medications   acetaminophen 500 MG tablet Commonly known as: TYLENOL Take 500 mg by mouth 3 (three) times daily. Morning: 7am-10am  MidDay: 11am-2pm Evening: 7pm-10pm   acetaminophen 500 MG tablet Commonly known as: TYLENOL Take 500 mg by mouth every 8 (eight) hours as needed.   albuterol 108 (90 Base) MCG/ACT inhaler Commonly known as: VENTOLIN HFA  Inhale 2 puffs into the lungs every 6 (six) hours as needed for wheezing or shortness of breath.   alendronate 70 MG tablet Commonly known as: FOSAMAX Take 70 mg by mouth once a week. Take with a full glass of water on an empty stomach. On Monday.   alum & mag hydroxide-simeth 200-200-20 MG/5ML suspension Commonly known as: MAALOX/MYLANTA Take 30 mLs by mouth every 4 (four) hours as needed for indigestion or heartburn.   Biofreeze 4 % Gel Generic drug: Menthol (Topical Analgesic) Apply topically 4 (four) times daily. To lower back in the Morning, Mid-Day, Afternoon, and Evening.   calcium carbonate 1500 (600 Ca) MG Tabs tablet Commonly known as: OSCAL Take 600 mg of elemental calcium by mouth daily. 7am-10am   camphor-menthol  lotion Commonly known as: SARNA Apply 1 application topically as needed. To arms in am and legs at bed apply to bilateral lower extremities every night at bedtime for itching   . Can also use as needed.   cetaphil cream Apply 1 application topically daily. APPLY  TO BILATERAL ARMS AND LEGS TO MOISTURIZE SKIN  7am-3pm   clobetasol cream 0.05 % Commonly known as: TEMOVATE Apply 1 application topically 2 (two) times daily as needed.   divalproex 125 MG DR tablet Commonly known as: DEPAKOTE Take 125 mg by mouth 2 (two) times daily. Morning: 7am-10am Evening: 7pm-10pm   donepezil 10 MG tablet Commonly known as: ARICEPT Take 10 mg by mouth at bedtime. 8pm-11pm   famotidine 10 MG tablet Commonly known as: PEPCID Take 10 mg by mouth at bedtime. 7pm-10pm.   furosemide 20 MG tablet Commonly known as: LASIX Take 20 mg by mouth daily. 7am-10am.   lactose free nutrition Liqd Take 237 mLs by mouth 2 (two) times daily. 2pm & 7pm.   losartan 50 MG tablet Commonly known as: COZAAR Take 50 mg by mouth daily. 7am-10am.   memantine 5 MG tablet Commonly known as: NAMENDA Take 5 mg by mouth 2 (two) times daily. Morning: 7am-10pm Evening: 7pm-10pm    metolazone 2.5 MG tablet Commonly known as: ZAROXOLYN Take 2.5 mg by mouth daily. Monday, Wednesday and Friday at 8am.   nitroGLYCERIN 0.4 MG SL tablet Commonly known as: NITROSTAT Place 0.4 mg under the tongue every 5 (five) minutes as needed for chest pain. After taking three times and no relief call MD.   polyethylene glycol 17 g packet Commonly known as: MIRALAX / GLYCOLAX Take 17 g by mouth daily as needed for moderate constipation.   potassium chloride SA 20 MEQ tablet Commonly known as: KLOR-CON Take 40 mEq by mouth daily. Take 2 tabs to = 40 mEq  7am-10am.   pyridOXINE 100 MG tablet Commonly known as: VITAMIN B-6 Take 100 mg by mouth daily. 7am-10am.   sertraline 50 MG tablet Commonly known as: ZOLOFT Take 50 mg by mouth daily. 7am-10am   zinc oxide 20 % ointment Apply 1 application topically as needed for irritation. To buttocks after every incontinent episode and as needed for redness. May keep at bedside      ROS was provided with assistance of staff.  Review of Systems  Constitutional: Positive for activity change, appetite change and fatigue. Negative for chills, diaphoresis and fever.  HENT: Positive for hearing loss. Negative for congestion and voice change.   Eyes: Negative for visual disturbance.  Respiratory: Negative for cough, shortness of breath and wheezing.   Cardiovascular: Negative for chest pain, palpitations and leg swelling.  Gastrointestinal: Negative for abdominal distention, abdominal pain, constipation, diarrhea, nausea and vomiting.       Loose stools.   Genitourinary: Negative for difficulty urinating, dysuria and urgency.  Musculoskeletal: Positive for arthralgias, back pain and gait problem.  Skin: Negative for color change and pallor.  Neurological: Negative for dizziness, speech difficulty, weakness and headaches.       Dementia  Psychiatric/Behavioral: Positive for agitation, behavioral problems, confusion and dysphoric mood.  Negative for hallucinations and sleep disturbance. The patient is nervous/anxious.     Immunization History  Administered Date(s) Administered  . HiB (PRP-OMP) 02/12/2004  . Influenza Split 01/11/2012  . Influenza Whole 01/07/2009, 01/15/2018  . Influenza-Unspecified 01/11/2014, 01/01/2015, 01/29/2016, 02/01/2017  . PFIZER SARS-COV-2 Vaccination 04/15/2019  . PPD Test 11/14/2014, 06/03/2015, 06/17/2015  . Pneumococcal Conjugate-13 02/08/2017  .  Pneumococcal Polysaccharide-23 04/28/2010  . Tdap 08/24/2011  . Zoster 04/13/2010   Pertinent  Health Maintenance Due  Topic Date Due  . INFLUENZA VACCINE  Completed  . DEXA SCAN  Completed  . PNA vac Low Risk Adult  Completed   Fall Risk  01/04/2018 12/29/2016 11/02/2016 06/20/2015 05/23/2015  Falls in the past year? No No Yes No No  Comment - - Emmi Telephone Survey: data to providers prior to load - -  Number falls in past yr: - - 2 or more - -  Comment - - Emmi Telephone Survey Actual Response = 90 - -  Injury with Fall? - - Yes - -   Functional Status Survey:    Vitals:   05/03/19 1508  BP: (!) 118/58  Pulse: 70  Resp: 18  Temp: (!) 97.3 F (36.3 C)  SpO2: 92%  Weight: 139 lb 12.8 oz (63.4 kg)  Height: '5\' 5"'$  (1.651 m)   Body mass index is 23.26 kg/m. Physical Exam Vitals and nursing note reviewed.  Constitutional:      Appearance: She is ill-appearing.  HENT:     Head: Normocephalic and atraumatic.     Nose: Nose normal.     Mouth/Throat:     Mouth: Mucous membranes are dry.  Eyes:     Extraocular Movements: Extraocular movements intact.     Conjunctiva/sclera: Conjunctivae normal.     Pupils: Pupils are equal, round, and reactive to light.  Cardiovascular:     Rate and Rhythm: Normal rate and regular rhythm.     Heart sounds: No murmur.  Pulmonary:     Breath sounds: No wheezing, rhonchi or rales.  Abdominal:     General: Bowel sounds are normal. There is no distension.     Palpations: Abdomen is soft.      Tenderness: There is no abdominal tenderness. There is no right CVA tenderness, left CVA tenderness, guarding or rebound.  Musculoskeletal:     Cervical back: Normal range of motion and neck supple.     Right lower leg: No edema.     Left lower leg: No edema.  Skin:    General: Skin is warm and dry.  Neurological:     General: No focal deficit present.     Mental Status: She is alert. Mental status is at baseline.     Coordination: Coordination normal.     Gait: Gait abnormal.     Comments: Oriented to self  Psychiatric:     Comments: Irritable.      Labs reviewed: Recent Labs    12/27/18 0000 12/27/18 0000 03/01/19 0000 03/30/19 0000 04/11/19 0000  NA 142   < > 143 141 142  K 4.3   < > 4.3 4.1 4.1  CL 106  --  106 103 104  CO2 29  --  30* 28* 31*  BUN 20   < > 25* 24* 30*  CREATININE 1.0   < > 1.0 0.9 0.9  CALCIUM 10.4  --  10.0  --  9.6   < > = values in this interval not displayed.   Recent Labs    03/01/19 0000 03/30/19 0000 04/11/19 0000  AST '15 16 14  '$ ALT '13 13 10  '$ ALKPHOS 43 51 46  ALBUMIN 3.3* 3.6 3.1*   Recent Labs    03/01/19 0000 03/30/19 0000 04/11/19 0000  WBC 8.2 9.6 10.9  NEUTROABS 5,756  --   --   HGB 11.3* 12.9 11.5*  HCT 35* 38  34*  PLT 365 490* 387   Lab Results  Component Value Date   TSH 1.25 03/01/2019   Lab Results  Component Value Date   HGBA1C 4.4 11/14/2015   Lab Results  Component Value Date   CHOL 176 05/15/2014   HDL 62 05/15/2014   LDLCALC 95 05/15/2014   LDLDIRECT 114.3 12/08/2007   TRIG 93 05/15/2014   CHOLHDL 2.9 CALC 12/08/2007    Significant Diagnostic Results in last 30 days:  No results found.  Assessment/Plan Leukocytosis 05/02/19 wbc 18.6, Hgb 11.1, plt 544, neutrophils 88.1, will obtain UA C/S, CXR to identify source of the infection, Rocephin 1gm IM qd x 7 days.   Acute renal injury ([place]) 05/02/19 Na 141, K 4.4, Bun 48, creat 1.46, eGFR 31 (04/11/19 Bun 30, creat 0.93, eGFR 53) 05/03/19 dc  Furosemide, Metolazone, Kcl,    Essential hypertension Running low, dc'd Torsemide, Metolazone may help to normalized Bp, continue Losartan for now. Observe.   PVD (peripheral vascular disease) (HCC) No apparent edema, dc Torsemide, Metolazone.   Moderate dementia without behavioral disturbance (HCC) Continue SNF [facility] for safety, care assistance, hold Donepezil in setting of loose stools, continue Memantine.   Advanced care planning/counseling discussion 01/27/18 MOST comfort measures, no IVF or feeding tube, determine use or limitation of antibiotics when infection occurs.   Osteoarthritis (arthritis due to wear and tear of joints) Mostly lower back pain, controlled, continue Tylenol.   Loose stools Will hold Donepezil, may consider stool culture, C-diff toxin A/B, ? Pending COVID test result.      Family/ staff Communication: plan of care reviewed with the patient and charge nurse.   Labs/tests ordered:  UA C/S, CXR  Time spend 35 minutes.

## 2019-05-03 NOTE — Assessment & Plan Note (Signed)
Continue SNF [facility] for safety, care assistance, hold Donepezil in setting of loose stools, continue Memantine.

## 2019-05-03 NOTE — Assessment & Plan Note (Signed)
05/02/19 Na 141, K 4.4, Bun 48, creat 1.46, eGFR 31 (04/11/19 Bun 30, creat 0.93, eGFR 53) 05/03/19 dc Furosemide, Metolazone, Kcl,

## 2019-05-03 NOTE — Assessment & Plan Note (Signed)
Running low, dc'd Torsemide, Metolazone may help to normalized Bp, continue Losartan for now. Observe.

## 2019-05-03 NOTE — Assessment & Plan Note (Signed)
No apparent edema, dc Torsemide, Metolazone.

## 2019-05-03 NOTE — Assessment & Plan Note (Signed)
Will hold Donepezil, may consider stool culture, C-diff toxin A/B, ? Pending COVID test result.

## 2019-05-03 NOTE — Assessment & Plan Note (Signed)
01/27/18 MOST comfort measures, no IVF or feeding tube, determine use or limitation of antibiotics when infection occurs.

## 2019-05-04 DIAGNOSIS — R05 Cough: Secondary | ICD-10-CM | POA: Diagnosis not present

## 2019-05-05 ENCOUNTER — Non-Acute Institutional Stay (SKILLED_NURSING_FACILITY): Payer: Medicare Other | Admitting: Internal Medicine

## 2019-05-05 ENCOUNTER — Encounter: Payer: Self-pay | Admitting: Internal Medicine

## 2019-05-05 DIAGNOSIS — T148XXA Other injury of unspecified body region, initial encounter: Secondary | ICD-10-CM

## 2019-05-05 DIAGNOSIS — N3000 Acute cystitis without hematuria: Secondary | ICD-10-CM | POA: Diagnosis not present

## 2019-05-05 DIAGNOSIS — N179 Acute kidney failure, unspecified: Secondary | ICD-10-CM | POA: Diagnosis not present

## 2019-05-05 DIAGNOSIS — I10 Essential (primary) hypertension: Secondary | ICD-10-CM | POA: Diagnosis not present

## 2019-05-05 DIAGNOSIS — D72825 Bandemia: Secondary | ICD-10-CM

## 2019-05-05 DIAGNOSIS — L89152 Pressure ulcer of sacral region, stage 2: Secondary | ICD-10-CM

## 2019-05-05 DIAGNOSIS — F0390 Unspecified dementia without behavioral disturbance: Secondary | ICD-10-CM | POA: Diagnosis not present

## 2019-05-05 DIAGNOSIS — F03B0 Unspecified dementia, moderate, without behavioral disturbance, psychotic disturbance, mood disturbance, and anxiety: Secondary | ICD-10-CM

## 2019-05-05 NOTE — Progress Notes (Signed)
Location:   [facility] Room Number: 3 Place of Service:  SNF (31) Provider:  Veleta Miners MD  [name], [name], NP  Patient Care Team: [name], [name], NP as PCP - General (Internal Medicine) [name], [name], NP as Nurse Practitioner (Nurse Practitioner) Virgie Dad, MD as Consulting Physician (Internal Medicine)  Extended Emergency Contact Information Primary Emergency Contact: [place] Address: [place], [place] [address] [name] of [place] Phone: [phone] Work Phone: [phone] Mobile Phone: [phone] Relation: Daughter Secondary Emergency Contact: [name] Address: [address] [place] [address] [name] of [place] Phone: [phone] Mobile Phone: [phone] Relation: Son  Code Status:  DNR Goals of care: Advanced Directive information Advanced Directives 05/03/2019  Does Patient Have a Medical Advance Directive? Yes  Type of Paramedic of [place];Living will;Out of facility DNR (pink MOST or yellow form)  Does patient want to make changes to medical advance directive? No - Patient declined  Copy of [place] in Chart? Yes - validated most recent copy scanned in chart (See row information)  Would patient like information on creating a medical advance directive? -  Pre-existing out of facility DNR order (yellow form or pink MOST form) Pink MOST/Yellow Form most recent copy in chart - Physician notified to receive inpatient order     Chief Complaint  Patient presents with  . Acute Visit    Follow up    HPI:  Pt is a 86 y.o. female seen today for an acute visit for Follow up of Her for Urosepsis  Patient has h/o Hypertension, Bilateral LE edema, Lower Back Pain, Osteoporosis urinary Incontinence and Dementia with Behavior Issues  Patient was seen few days ago for change in mental status.  Her labs showed increased  leukocytes with acute renal insufficiency.  She was started on Rocephin IM.  Her torsemide and metolazone was discontinued Her chest x-ray was negative for any acute pneumonia Her urine culture today showed more than 100,000 of Proteus sensitive to ceftriaxone Patient doing little better.  But continues to have loose stools.  Also decreased appetite. Also hence now does not excoriated skin in her bottom. She is unable to give me any history though was alert and did not seem in any distress no fever.  No abdominal pain    Past Medical History:  Diagnosis Date  . Abnormality of gait 04/28/2010   History of fall onto right shoulder-has participated in PT. Uses walker.     . Acute encephalopathy 10/25/14   Occurred when septic  . Acute lower GI bleeding 10/30/2014  . Adjustment disorder with mixed anxiety and depressed mood 06/04/2015   11/15/15 Hgb 12.8, Na 139, K 4.2, Bun 14, creat 0.68, TP 5.7, albumin 3.5, TSH 1.54, Hgb a1c 4.4   . Anemia   . Aortic atherosclerosis ([place]) 10/27/2014  . Asthma   . Cerebral atrophy ([place]) 10/27/2014  . Cerebrovascular disease 10/27/2014   Small vessel disease  . Cervical disc disease   . Chronic lower back pain 06/04/2015   lumbar spondylosis. Regular f/u Dr. French Ana. Receives injections last 01/2013.  05/27/15 Xray left hip/pelvis: spondylotic changes, severe osteoarthritis Continue Tylenol and Tramadol prn for pain, observe the patient, adding Cymbalta may help. Mid to lower back pain more on the right side back, may consider X-ray of thoracic spine.  06/01/15 CT thoracic spine showed no acute changes.  Patient received 10 injection in the back through Dr. Alvester Morin office. She says the previous problems of discomfort in the back and down the leg have completely resolved. Takes Aleve 220mg  bid   . Debility 06/20/2015   11/15/15 Hgb 12.8, Na 139, K 4.2, Bun 14, creat 0.68, TP 5.7, albumin 3.5, TSH 1.54, Hgb a1c 4.4   . Degenerative disc disease, lumbar 10/27/2014  .  Depression   . Dermatitis 03/25/2016  . Diarrhea 10/25/14  . Diverticulosis 10/27/2014  . Essential hypertension 09/20/2006   11/15/15 Hgb 12.8, Na 139, K 4.2, Bun 14, creat 0.68, TP 5.7, albumin 3.5, TSH 1.54, Hgb a1c 4.4   . Fall at nursing home 10/25/2014  . GERD (gastroesophageal reflux disease)   . History of cardiovascular disorder 09/20/2006   X 3 in 2000 now on aspirin.    Marland Kitchen Hyperlipidemia 09/20/2006   05/15/14 LDL 95   . Hypertension   . Lumbosacral spondylosis 10/28/2009  . OSTEOPOROSIS 09/20/2006   Noted 2008    . Overactive bladder 11/29/2013   Myrbetriq prescribed by urology.    . Protein calorie malnutrition ([place])    06/06/15 TP 6.0 albumin 2.9  . SCOLIOSIS 10/28/2009  . Scoliosis 10/27/2014  . Thrombocytosis ([place]) 05/31/2015   06/10/15 plt 602 07/23/15 plt 335 11/15/15 Hgb 12.8, Na 139, K 4.2, Bun 14, creat 0.68, TP 5.7, albumin 3.5, TSH 1.54, Hgb a1c 4.4  . TIA (transient ischemic attack) 09/20/2006   Past Surgical History:  Procedure Laterality Date  . ABDOMINAL HYSTERECTOMY    . APPENDECTOMY    . CHOLECYSTECTOMY    . TONSILLECTOMY      Allergies  Allergen Reactions  . Tramadol Other (See Comments)    Hallucinations, agitation/combativeness  . Amoxicillin     Per MAR  . Oxycodone Other (See Comments)    Per MAR   . Sulfamethoxazole     Per MAR   . Penicillins Rash    Allergies as of 05/05/2019      Reactions   Tramadol Other (See Comments)   Hallucinations, agitation/combativeness   Amoxicillin    Per MAR   Oxycodone Other (See Comments)   Per MAR    Sulfamethoxazole    Per MAR    Penicillins Rash      Medication List       Accurate as of May 05, 2019  2:18 PM. If you have any questions, ask your nurse or doctor.        STOP taking these medications   famotidine 10 MG tablet Commonly known as: PEPCID Stopped by: Virgie Dad, MD   furosemide 20 MG tablet Commonly known as: LASIX Stopped by: Virgie Dad, MD   metolazone 2.5 MG  tablet Commonly known as: ZAROXOLYN Stopped by: Virgie Dad, MD   potassium chloride SA 20 MEQ tablet Commonly known as: KLOR-CON Stopped by: Virgie Dad, MD     TAKE these medications   acetaminophen 500 MG tablet Commonly known as: TYLENOL Take 500 mg by mouth 3 (three) times daily. Morning: 7am-10am  MidDay: 11am-2pm Evening: 7pm-10pm   acetaminophen 500 MG tablet Commonly known as: TYLENOL Take 500 mg by mouth every 8 (eight) hours as needed.   albuterol 108 (90 Base) MCG/ACT inhaler Commonly known as: VENTOLIN HFA Inhale 2 puffs into the lungs every 6 (six) hours as needed for wheezing or shortness of breath.   alendronate 70 MG tablet Commonly known as: FOSAMAX  Take 70 mg by mouth once a week. Take with a full glass of water on an empty stomach. On Monday.   alum & mag hydroxide-simeth 200-200-20 MG/5ML suspension Commonly known as: MAALOX/MYLANTA Take 30 mLs by mouth every 4 (four) hours as needed for indigestion or heartburn.   Biofreeze 4 % Gel Generic drug: Menthol (Topical Analgesic) Apply topically 4 (four) times daily. To lower back in the Morning, Mid-Day, Afternoon, and Evening.   calcium carbonate 1500 (600 Ca) MG Tabs tablet Commonly known as: OSCAL Take 600 mg of elemental calcium by mouth daily. 7am-10am   camphor-menthol lotion Commonly known as: SARNA Apply 1 application topically as needed. To arms in am and legs at bed apply to bilateral lower extremities every night at bedtime for itching   . Can also use as needed.   cefTRIAXone 1 g injection Commonly known as: ROCEPHIN Inject 1 g into the muscle. For 7 days   cetaphil cream Apply 1 application topically daily. APPLY  TO BILATERAL ARMS AND LEGS TO MOISTURIZE SKIN  7am-3pm   clobetasol cream 0.05 % Commonly known as: TEMOVATE Apply 1 application topically 2 (two) times daily as needed.   divalproex 125 MG DR tablet Commonly known as: DEPAKOTE Take 125 mg by mouth 2 (two) times  daily. Morning: 7am-10am Evening: 7pm-10pm   donepezil 10 MG tablet Commonly known as: ARICEPT Take 10 mg by mouth at bedtime. 8pm-11pm   lactose free nutrition Liqd Take 237 mLs by mouth 2 (two) times daily. 2pm & 7pm.   losartan 50 MG tablet Commonly known as: COZAAR Take 50 mg by mouth daily. 7am-10am.   memantine 5 MG tablet Commonly known as: NAMENDA Take 5 mg by mouth 2 (two) times daily. Morning: 7am-10pm Evening: 7pm-10pm   nitroGLYCERIN 0.4 MG SL tablet Commonly known as: NITROSTAT Place 0.4 mg under the tongue every 5 (five) minutes as needed for chest pain. After taking three times and no relief call MD.   polyethylene glycol 17 g packet Commonly known as: MIRALAX / GLYCOLAX Take 17 g by mouth daily as needed for moderate constipation.   pyridOXINE 100 MG tablet Commonly known as: VITAMIN B-6 Take 100 mg by mouth daily. 7am-10am.   QUEtiapine 25 MG tablet Commonly known as: SEROQUEL Take 12.5 mg by mouth every 12 (twelve) hours as needed. 1/2 tab = 12.5mg    sertraline 50 MG tablet Commonly known as: ZOLOFT Take 50 mg by mouth daily. 7am-10am   triamcinolone cream 0.5 % Commonly known as: KENALOG Apply 1 application topically 2 (two) times daily. apply BID to reddened areas on bilateral buttocks, mix with Nystatin cream.   zinc oxide 20 % ointment Apply 1 application topically as needed for irritation. To buttocks after every incontinent episode and as needed for redness. May keep at bedside       Review of Systems  Unable to perform ROS: Dementia    Immunization History  Administered Date(s) Administered  . HiB (PRP-OMP) 02/12/2004  . Influenza Split 01/11/2012  . Influenza Whole 01/07/2009, 01/15/2018  . Influenza-Unspecified 01/11/2014, 01/01/2015, 01/29/2016, 02/01/2017  . PFIZER SARS-COV-2 Vaccination 04/15/2019  . PPD Test 11/14/2014, 06/03/2015, 06/17/2015  . Pneumococcal Conjugate-13 02/08/2017  . Pneumococcal Polysaccharide-23  04/28/2010  . Tdap 08/24/2011  . Zoster 04/13/2010   Pertinent  Health Maintenance Due  Topic Date Due  . INFLUENZA VACCINE  Completed  . DEXA SCAN  Completed  . PNA vac Low Risk Adult  Completed   Fall Risk  01/04/2018 12/29/2016 11/02/2016  06/20/2015 05/23/2015  Falls in the past year? No No Yes No No  Comment - - Emmi Telephone Survey: data to providers prior to load - -  Number falls in past yr: - - 2 or more - -  Comment - - Emmi Telephone Survey Actual Response = 90 - -  Injury with Fall? - - Yes - -   Functional Status Survey:    Vitals:   05/05/19 1359  BP: (!) 118/58  Pulse: 70  Resp: 18  Temp: (!) 96.1 F (35.6 C)  SpO2: 96%  Weight: 139 lb 12.8 oz (63.4 kg)  Height: 5\' 5"  (1.651 m)   Body mass index is 23.26 kg/m. Physical Exam Vitals reviewed.  Constitutional:      Appearance: Normal appearance.  HENT:     Head: Normocephalic.     Nose: Nose normal.     Mouth/Throat:     Mouth: Mucous membranes are moist.     Pharynx: Oropharynx is clear.  Eyes:     Pupils: Pupils are equal, round, and reactive to light.  Cardiovascular:     Rate and Rhythm: Normal rate and regular rhythm.     Pulses: Normal pulses.  Pulmonary:     Effort: Pulmonary effort is normal. No respiratory distress.     Breath sounds: Normal breath sounds. No wheezing or rales.  Abdominal:     General: Abdomen is flat. Bowel sounds are normal.     Palpations: Abdomen is soft.  Musculoskeletal:        General: No swelling.     Cervical back: Neck supple.     Comments: Chronic Venous Changes  Skin:    Comments: Excoriated skin in Bottom with Stage 2 pressure Ulcer  Neurological:     General: No focal deficit present.     Mental Status: She is alert.  Psychiatric:        Mood and Affect: Mood normal.     Labs reviewed: Recent Labs    03/01/19 0000 03/01/19 0000 03/30/19 0000 04/11/19 0000 05/02/19 0000  NA 143   < > 141 142 141  K 4.3   < > 4.1 4.1 4.4  CL 106   < > 103 104 104   CO2 30*   < > 28* 31* 26*  BUN 25*   < > 24* 30* 48*  CREATININE 1.0   < > 0.9 0.9 1.5*  CALCIUM 10.0  --   --  9.6 10.6   < > = values in this interval not displayed.   Recent Labs    03/30/19 0000 04/11/19 0000 05/02/19 0000  AST 16 14 15   ALT 13 10 11   ALKPHOS 51 46 54  ALBUMIN 3.6 3.1* 2.8*   Recent Labs    03/01/19 0000 03/01/19 0000 03/30/19 0000 04/11/19 0000 05/02/19 0000  WBC 8.2   < > 9.6 10.9 18.6  NEUTROABS 5,756  --   --   --  16,387  HGB 11.3*   < > 12.9 11.5* 11.1*  HCT 35*   < > 38 34* 33*  PLT 365   < > 490* 387 544*   < > = values in this interval not displayed.   Lab Results  Component Value Date   TSH 1.25 03/01/2019   Lab Results  Component Value Date   HGBA1C 4.4 11/14/2015   Lab Results  Component Value Date   CHOL 176 05/15/2014   HDL 62 05/15/2014   [place] 95 05/15/2014  LDLDIRECT 114.3 12/08/2007   TRIG 93 05/15/2014   CHOLHDL 2.9 CALC 12/08/2007    Significant Diagnostic Results in last 30 days:  No results found.  Assessment/Plan  UTI with leukocytosis and change in mental status We will continue on ceftriaxone for 7 days  Acute renal injury ([place]) Lasix and metolazone was stopped Patient still not eating well Plan continue supplements Repeat BMP  Essential hypertension Controlled on Cozaar Moderate dementia withbehavioral disturbance (HCC) We will discontinue Aricept Continue on Depakote and Namenda for her behavior Pressure injury of sacral region, stage 2  Hydrocolloid dressing to the open area We will start her on Diflucan 100 mg daily for 5 days for her excoriation  Loose stools Discontinue Aricept We will send the stool for C. difficile and culture     Family/ staff Communication:  Labs/tests ordered:

## 2019-05-08 DIAGNOSIS — Z20828 Contact with and (suspected) exposure to other viral communicable diseases: Secondary | ICD-10-CM | POA: Diagnosis not present

## 2019-05-11 ENCOUNTER — Non-Acute Institutional Stay (SKILLED_NURSING_FACILITY): Payer: Medicare Other | Admitting: Internal Medicine

## 2019-05-11 ENCOUNTER — Encounter: Payer: Self-pay | Admitting: Internal Medicine

## 2019-05-11 DIAGNOSIS — L89152 Pressure ulcer of sacral region, stage 2: Secondary | ICD-10-CM | POA: Diagnosis not present

## 2019-05-11 DIAGNOSIS — I739 Peripheral vascular disease, unspecified: Secondary | ICD-10-CM | POA: Diagnosis not present

## 2019-05-11 DIAGNOSIS — I10 Essential (primary) hypertension: Secondary | ICD-10-CM

## 2019-05-11 DIAGNOSIS — N3000 Acute cystitis without hematuria: Secondary | ICD-10-CM

## 2019-05-11 DIAGNOSIS — N390 Urinary tract infection, site not specified: Secondary | ICD-10-CM | POA: Diagnosis not present

## 2019-05-11 DIAGNOSIS — N179 Acute kidney failure, unspecified: Secondary | ICD-10-CM | POA: Diagnosis not present

## 2019-05-11 DIAGNOSIS — F0390 Unspecified dementia without behavioral disturbance: Secondary | ICD-10-CM | POA: Diagnosis not present

## 2019-05-11 DIAGNOSIS — F03B0 Unspecified dementia, moderate, without behavioral disturbance, psychotic disturbance, mood disturbance, and anxiety: Secondary | ICD-10-CM

## 2019-05-11 DIAGNOSIS — T148XXA Other injury of unspecified body region, initial encounter: Secondary | ICD-10-CM | POA: Diagnosis not present

## 2019-05-11 NOTE — Progress Notes (Signed)
Location:  [facility] Room Number: 3 Place of Service:  SNF (31) Provider:  Karryn Kosinski L. Lyndel Safe, MD   [name], [name], NP  Patient Care Team: [name], [name], NP as PCP - General (Internal Medicine) [name], [name], NP as Nurse Practitioner (Nurse Practitioner) Virgie Dad, MD as Consulting Physician (Internal Medicine)  Extended Emergency Contact Information Primary Emergency Contact: [name] Address: [place], [place] [address] [name] of [place] Phone: [phone] Work Phone: [phone] Mobile Phone: [phone] Relation: Daughter Secondary Emergency Contact: [name] Address: [address] [place] [address] [name] of [place] Phone: [phone] Mobile Phone: [phone] Relation: Son  Code Status: DNR Goals of care: Advanced Directive information Advanced Directives 05/11/2019  Does Patient Have a Medical Advance Directive? Yes  Type of Paramedic of [place];Living will;Out of facility DNR (pink MOST or yellow form)  Does patient want to make changes to medical advance directive? No - Patient declined  Copy of [place] in Chart? Yes - validated most recent copy scanned in chart (See row information)  Would patient like information on creating a medical advance directive? -  Pre-existing out of facility DNR order (yellow form or pink MOST form) Yellow form placed in chart (order not valid for inpatient use)     Chief Complaint  Patient presents with  . Follow-up    Follow up on UTI, Diarrhea and Perianal Rash    HPI:  Pt is a 86 y.o. female seen today for medical management of chronic diseases.   Patient has h/o Hypertension, Bilateral LE edema, Lower Back Pain, Osteoporosis urinary Incontinence and Dementia with Behavior Issues  Patient was seen few days ago for change in mental status.  Her labs showed increased  leukocytes with acute renal insufficiency.  She was started on Rocephin IM.  Her torsemide and metolazone was discontinued Her chest x-ray was negative for any acute pneumonia Her urine culture today showed more than 100,000 of Proteus sensitive to ceftriaxone She also had loose Stool on Rocephin. Her C diff is pending but per Nurses she is not having Loose stools anymore. Her Perianal area rash is better also Patient unable to give history. Her Mental status is at baseline. Looks Comfortable   Past Medical History:  Diagnosis Date  . Abnormality of gait 04/28/2010   History of fall onto right shoulder-has participated in PT. Uses walker.     . Acute encephalopathy 10/25/14   Occurred when septic  . Acute lower GI bleeding 10/30/2014  . Adjustment disorder with mixed anxiety and depressed mood 06/04/2015   11/15/15 Hgb 12.8, Na 139, K 4.2, Bun 14, creat 0.68, TP 5.7, albumin 3.5, TSH 1.54, Hgb a1c 4.4   . Anemia   . Aortic atherosclerosis ([facility]) 10/27/2014  . Asthma   . Cerebral atrophy ([facility]) 10/27/2014  . Cerebrovascular disease 10/27/2014   Small vessel disease  . Cervical disc disease   . Chronic lower back pain 06/04/2015   lumbar spondylosis. Regular f/u Dr. French Ana. Receives injections last 01/2013.  05/27/15 Xray left hip/pelvis: spondylotic changes, severe osteoarthritis Continue Tylenol and Tramadol prn for pain, observe the patient, adding Cymbalta may help. Mid to lower back pain more on the right side back, may consider X-ray of thoracic spine. 06/01/15 CT thoracic spine showed no acute changes.  Patient received 10  injection in the back through Dr. Alvester Morin office. She says the previous problems of discomfort in the back and down the leg have completely resolved. Takes Aleve 220mg  bid   . Debility 06/20/2015   11/15/15 Hgb 12.8, Na 139, K 4.2, Bun 14, creat 0.68, TP 5.7, albumin 3.5, TSH 1.54, Hgb a1c 4.4   . Degenerative disc disease, lumbar 10/27/2014  . Depression   . Dermatitis  03/25/2016  . Diarrhea 10/25/14  . Diverticulosis 10/27/2014  . Essential hypertension 09/20/2006   11/15/15 Hgb 12.8, Na 139, K 4.2, Bun 14, creat 0.68, TP 5.7, albumin 3.5, TSH 1.54, Hgb a1c 4.4   . Fall at nursing home 10/25/2014  . GERD (gastroesophageal reflux disease)   . History of cardiovascular disorder 09/20/2006   X 3 in 2000 now on aspirin.    Marland Kitchen Hyperlipidemia 09/20/2006   05/15/14 LDL 95   . Hypertension   . Lumbosacral spondylosis 10/28/2009  . OSTEOPOROSIS 09/20/2006   Noted 2008    . Overactive bladder 11/29/2013   Myrbetriq prescribed by urology.    . Protein calorie malnutrition ([facility])    06/06/15 TP 6.0 albumin 2.9  . SCOLIOSIS 10/28/2009  . Scoliosis 10/27/2014  . Thrombocytosis ([facility]) 05/31/2015   06/10/15 plt 602 07/23/15 plt 335 11/15/15 Hgb 12.8, Na 139, K 4.2, Bun 14, creat 0.68, TP 5.7, albumin 3.5, TSH 1.54, Hgb a1c 4.4  . TIA (transient ischemic attack) 09/20/2006   Past Surgical History:  Procedure Laterality Date  . ABDOMINAL HYSTERECTOMY    . APPENDECTOMY    . CHOLECYSTECTOMY    . TONSILLECTOMY      Allergies  Allergen Reactions  . Tramadol Other (See Comments)    Hallucinations, agitation/combativeness  . Amoxicillin     Per MAR  . Oxycodone Other (See Comments)    Per MAR   . Sulfamethoxazole     Per MAR   . Penicillins Rash    Outpatient Encounter Medications as of 05/11/2019  Medication Sig  . acetaminophen (TYLENOL) 500 MG tablet Take 500 mg by mouth 3 (three) times daily. Morning: 7am-10am  MidDay: 11am-2pm Evening: 7pm-10pm  . acetaminophen (TYLENOL) 500 MG tablet Take 500 mg by mouth every 8 (eight) hours as needed.  Marland Kitchen albuterol (PROVENTIL HFA;VENTOLIN HFA) 108 (90 BASE) MCG/ACT inhaler Inhale 2 puffs into the lungs every 6 (six) hours as needed for wheezing or shortness of breath.  Marland Kitchen alendronate (FOSAMAX) 70 MG tablet Take 70 mg by mouth once a week. Take with a full glass of water on an empty stomach. On Monday.  Marland Kitchen alum & mag hydroxide-simeth  (MAALOX/MYLANTA) 200-200-20 MG/5ML suspension Take 30 mLs by mouth every 4 (four) hours as needed for indigestion or heartburn.   . calcium carbonate (OSCAL) 1500 (600 Ca) MG TABS tablet Take 600 mg of elemental calcium by mouth daily. 7am-10am  . camphor-menthol (SARNA) lotion Apply 1 application topically as needed. To arms in am and legs at bed apply to bilateral lower extremities every night at bedtime for itching   . Can also use as needed.  . cetaphil (CETAPHIL) cream Apply 1 application topically daily. APPLY  TO BILATERAL ARMS AND LEGS TO MOISTURIZE SKIN  7am-3pm  . clobetasol cream (TEMOVATE) [id number] % Apply 1 application topically 2 (two) times daily as needed.  . divalproex (DEPAKOTE) 125 MG DR tablet Take 125 mg by mouth 2 (two) times daily. Morning: 7am-10am Evening: 7pm-10pm  . donepezil (ARICEPT) 10 MG tablet Take 10 mg by mouth at bedtime. 8pm-11pm  .  lactose free nutrition (BOOST) LIQD Take 237 mLs by mouth 2 (two) times daily. 2pm & 7pm.  . losartan (COZAAR) 50 MG tablet Take 50 mg by mouth daily. 7am-10am.  . memantine (NAMENDA) 5 MG tablet Take 5 mg by mouth 2 (two) times daily. Morning: 7am-10pm Evening: 7pm-10pm  . Menthol, Topical Analgesic, (BIOFREEZE) 4 % GEL Apply topically 4 (four) times daily. To lower back in the Morning, Mid-Day, Afternoon, and Evening.  . nitroGLYCERIN (NITROSTAT) 0.4 MG SL tablet Place 0.4 mg under the tongue every 5 (five) minutes as needed for chest pain. After taking three times and no relief call MD.  . NON FORMULARY Moisturizing Cream to extremities with morning and evening care Every Shift  . nystatin cream (MYCOSTATIN) Apply 1 application topically 2 (two) times daily. mix with triamcinolone cream, apply to reddened areas bilateral buttocks.  . polyethylene glycol (MIRALAX / GLYCOLAX) packet Take 17 g by mouth daily as needed for moderate constipation.   Marland Kitchen pyridOXINE (VITAMIN B-6) 100 MG tablet Take 100 mg by mouth daily. 7am-10am.  .  QUEtiapine (SEROQUEL) 25 MG tablet Take 12.5 mg by mouth every 12 (twelve) hours as needed. 1/2 tab = 12.5mg   . saccharomyces boulardii (FLORASTOR) 250 MG capsule Take 250 mg by mouth 2 (two) times daily.  . sertraline (ZOLOFT) 50 MG tablet Take 50 mg by mouth daily. 7am-10am  . triamcinolone cream (KENALOG) 0.5 % Apply 1 application topically 2 (two) times daily. apply BID to reddened areas on bilateral buttocks, mix with Nystatin cream.  . zinc oxide 20 % ointment Apply 1 application topically as needed for irritation. To buttocks after every incontinent episode and as needed for redness. May keep at bedside   No facility-administered encounter medications on file as of 05/11/2019.    Review of Systems  Unable to perform ROS: Dementia    Immunization History  Administered Date(s) Administered  . HiB (PRP-OMP) 02/12/2004  . Influenza Split 01/11/2012  . Influenza Whole 01/07/2009, 01/15/2018  . Influenza-Unspecified 01/11/2014, 01/01/2015, 01/29/2016, 02/01/2017  . PFIZER SARS-COV-2 Vaccination 04/15/2019  . PPD Test 11/14/2014, 06/03/2015, 06/17/2015  . Pneumococcal Conjugate-13 02/08/2017  . Pneumococcal Polysaccharide-23 04/28/2010  . Tdap 08/24/2011  . Zoster 04/13/2010   Pertinent  Health Maintenance Due  Topic Date Due  . INFLUENZA VACCINE  Completed  . DEXA SCAN  Completed  . PNA vac Low Risk Adult  Completed   Fall Risk  01/04/2018 12/29/2016 11/02/2016 06/20/2015 05/23/2015  Falls in the past year? No No Yes No No  Comment - - [facility] Telephone Survey: data to providers prior to load - -  Number falls in past yr: - - 2 or more - -  Comment - - [facility] Telephone Survey Actual Response = 90 - -  Injury with Fall? - - Yes - -   Functional Status Survey:    Vitals:   05/11/19 1621  BP: (!) 118/58  Pulse: 70  Resp: 18  Temp: 97.8 F (36.6 C)  TempSrc: Oral  SpO2: 94%  Weight: 139 lb 12.8 oz (63.4 kg)  Height: 5\' 5"  (1.651 m)   Body mass index is 23.26 kg/m. Physical  Exam Vitals reviewed.  Constitutional:      Appearance: Normal appearance.  HENT:     Head: Normocephalic.     Nose: Nose normal.     Mouth/Throat:     Mouth: Mucous membranes are moist.     Pharynx: Oropharynx is clear.  Eyes:     Pupils: Pupils are equal,  round, and reactive to light.  Cardiovascular:     Rate and Rhythm: Normal rate.     Pulses: Normal pulses.     Heart sounds: No murmur.  Pulmonary:     Effort: Pulmonary effort is normal. No respiratory distress.     Breath sounds: Normal breath sounds. No wheezing or rales.  Abdominal:     General: Abdomen is flat. Bowel sounds are normal.     Palpations: Abdomen is soft.  Musculoskeletal:     Cervical back: Neck supple.     Comments: Chronic Venous Changes Bilateral  Skin:    General: Skin is warm.     Comments: Has Nail Marks on her Skin as patient picks on her skin  Perianal area was much better. Still has some redness and Stage 2 Ulcer is healing. No Odor or discharge  Neurological:     General: No focal deficit present.     Mental Status: She is alert.  Psychiatric:        Mood and Affect: Mood normal.     Labs reviewed: Recent Labs    03/01/19 0000 03/01/19 0000 03/30/19 0000 04/11/19 0000 05/02/19 0000  NA 143   < > 141 142 141  141  K 4.3   < > 4.1 4.1 4.4  4.4  CL 106   < > 103 104 104  104  CO2 30*   < > 28* 31* 26*  26*  BUN 25*   < > 24* 30* 48*  48*  CREATININE 1.0   < > 0.9 0.9 1.5*  1.5*  CALCIUM 10.0  --   --  9.6 10.6   < > = values in this interval not displayed.   Recent Labs    03/30/19 0000 04/11/19 0000 05/02/19 0000  AST 16 14 15   ALT 13 10 11   ALKPHOS 51 46 54  ALBUMIN 3.6 3.1* 2.8*   Recent Labs    03/01/19 0000 03/01/19 0000 03/30/19 0000 04/11/19 0000 05/02/19 0000  WBC 8.2   < > 9.6 10.9 18.6  NEUTROABS 5,756  --   --   --  16,387  HGB 11.3*   < > 12.9 11.5* 11.1*  HCT 35*   < > 38 34* 33*  PLT 365   < > 490* 387 544*   < > = values in this interval  not displayed.   Lab Results  Component Value Date   TSH 1.25 03/01/2019   Lab Results  Component Value Date   HGBA1C 4.4 11/14/2015   Lab Results  Component Value Date   CHOL 176 05/15/2014   HDL 62 05/15/2014   LDLCALC 95 05/15/2014   LDLDIRECT 114.3 12/08/2007   TRIG 93 05/15/2014   CHOLHDL 2.9 CALC 12/08/2007    Significant Diagnostic Results in last 30 days:   Assessment/Plan Acute UTI with Mental status change Responded well to Rocephin White Count today was 12k today but does not look infected Awaiting C Diff Clinically better  Acute renal injury ([facility]) BUN 29 Creat .83 Lasix and Metolazone discontinued  Essential hypertension Controlled on Cozaar  Moderate dementia with behavioral disturbance (HCC) Aricept discontinued for now to see if it helps her Appetite and Diarrhea Continue on Namenda and depakote Pressure injury of sacral region, stage 2 (HCC) Hydrocolloid dressing  Excoriation of periwound skin Continue Nystatin Was treated with Diflucan before Loose Stool C diff Pending LE edema Will Continue To monitor Off Lasix     Family/ staff Communication:  Labs/tests ordered:  Repeat CBC and BMP in 2 weeks

## 2019-05-15 DEATH — deceased
